# Patient Record
Sex: Male | Born: 1940 | Race: White | Hispanic: No | Marital: Married | Smoking: Former smoker
Health system: Southern US, Community
[De-identification: ages and names within clinical notes are randomized; demographics above are authoritative.]

## PROBLEM LIST (undated history)

## (undated) DIAGNOSIS — I10 Essential (primary) hypertension: Secondary | ICD-10-CM

## (undated) DIAGNOSIS — G4733 Obstructive sleep apnea (adult) (pediatric): Secondary | ICD-10-CM

## (undated) DIAGNOSIS — M5003 Cervical disc disorder with myelopathy, cervicothoracic region: Secondary | ICD-10-CM

## (undated) DIAGNOSIS — C61 Malignant neoplasm of prostate: Secondary | ICD-10-CM

## (undated) DIAGNOSIS — F411 Generalized anxiety disorder: Secondary | ICD-10-CM

## (undated) DIAGNOSIS — Z9989 Dependence on other enabling machines and devices: Secondary | ICD-10-CM

## (undated) DIAGNOSIS — I951 Orthostatic hypotension: Secondary | ICD-10-CM

## (undated) DIAGNOSIS — M25562 Pain in left knee: Secondary | ICD-10-CM

## (undated) DIAGNOSIS — R519 Headache, unspecified: Secondary | ICD-10-CM

## (undated) DIAGNOSIS — G8929 Other chronic pain: Secondary | ICD-10-CM

## (undated) DIAGNOSIS — E782 Mixed hyperlipidemia: Secondary | ICD-10-CM

## (undated) DIAGNOSIS — Z96652 Presence of left artificial knee joint: Secondary | ICD-10-CM

## (undated) DIAGNOSIS — R51 Headache: Secondary | ICD-10-CM

## (undated) DIAGNOSIS — A059 Bacterial foodborne intoxication, unspecified: Secondary | ICD-10-CM

## (undated) DIAGNOSIS — F419 Anxiety disorder, unspecified: Secondary | ICD-10-CM

## (undated) HISTORY — PX: SEPTOPLASTY: SUR1290

## (undated) HISTORY — DX: Mixed hyperlipidemia: E78.2

## (undated) HISTORY — DX: Generalized anxiety disorder: F41.1

## (undated) HISTORY — DX: Presence of left artificial knee joint: Z96.652

## (undated) HISTORY — PX: HERNIA REPAIR: SHX51

## (undated) HISTORY — DX: Cervical disc disorder with myelopathy, cervicothoracic region: M50.03

## (undated) HISTORY — DX: Other chronic pain: G89.29

## (undated) HISTORY — DX: Orthostatic hypotension: I95.1

## (undated) HISTORY — DX: Malignant neoplasm of prostate: C61

## (undated) HISTORY — PX: CATARACT EXTRACTION W/ INTRAOCULAR LENS  IMPLANT, BILATERAL: SHX1307

## (undated) HISTORY — PX: CARDIAC CATHETERIZATION: SHX172

## (undated) HISTORY — DX: Pain in left knee: M25.562

## (undated) HISTORY — PX: JOINT REPLACEMENT: SHX530

---

## 2003-04-02 ENCOUNTER — Encounter: Admission: RE | Admit: 2003-04-02 | Discharge: 2003-04-02 | Payer: Self-pay | Admitting: Otolaryngology

## 2003-04-02 ENCOUNTER — Ambulatory Visit (HOSPITAL_BASED_OUTPATIENT_CLINIC_OR_DEPARTMENT_OTHER): Admission: RE | Admit: 2003-04-02 | Discharge: 2003-04-02 | Payer: Self-pay | Admitting: Otolaryngology

## 2003-04-02 ENCOUNTER — Encounter: Payer: Self-pay | Admitting: Otolaryngology

## 2004-12-14 HISTORY — PX: PROSTATE SURGERY: SHX751

## 2005-02-25 ENCOUNTER — Encounter: Admission: RE | Admit: 2005-02-25 | Discharge: 2005-02-25 | Payer: Self-pay | Admitting: Urology

## 2005-04-02 ENCOUNTER — Inpatient Hospital Stay (HOSPITAL_COMMUNITY): Admission: RE | Admit: 2005-04-02 | Discharge: 2005-04-04 | Payer: Self-pay | Admitting: Urology

## 2006-02-14 ENCOUNTER — Emergency Department (HOSPITAL_COMMUNITY): Admission: EM | Admit: 2006-02-14 | Discharge: 2006-02-14 | Payer: Self-pay | Admitting: Emergency Medicine

## 2011-12-29 DIAGNOSIS — R5383 Other fatigue: Secondary | ICD-10-CM | POA: Diagnosis not present

## 2011-12-29 DIAGNOSIS — L508 Other urticaria: Secondary | ICD-10-CM | POA: Diagnosis not present

## 2011-12-29 DIAGNOSIS — R5381 Other malaise: Secondary | ICD-10-CM | POA: Diagnosis not present

## 2012-01-12 DIAGNOSIS — D485 Neoplasm of uncertain behavior of skin: Secondary | ICD-10-CM | POA: Diagnosis not present

## 2012-01-12 DIAGNOSIS — L280 Lichen simplex chronicus: Secondary | ICD-10-CM | POA: Diagnosis not present

## 2012-01-12 DIAGNOSIS — L508 Other urticaria: Secondary | ICD-10-CM | POA: Diagnosis not present

## 2012-01-14 DIAGNOSIS — L509 Urticaria, unspecified: Secondary | ICD-10-CM | POA: Diagnosis not present

## 2012-01-14 DIAGNOSIS — I951 Orthostatic hypotension: Secondary | ICD-10-CM | POA: Diagnosis not present

## 2012-01-14 DIAGNOSIS — I10 Essential (primary) hypertension: Secondary | ICD-10-CM | POA: Diagnosis not present

## 2012-01-21 DIAGNOSIS — I498 Other specified cardiac arrhythmias: Secondary | ICD-10-CM | POA: Diagnosis not present

## 2012-02-04 DIAGNOSIS — Z125 Encounter for screening for malignant neoplasm of prostate: Secondary | ICD-10-CM | POA: Diagnosis not present

## 2012-02-04 DIAGNOSIS — Z8546 Personal history of malignant neoplasm of prostate: Secondary | ICD-10-CM | POA: Diagnosis not present

## 2012-02-04 DIAGNOSIS — E7800 Pure hypercholesterolemia, unspecified: Secondary | ICD-10-CM | POA: Diagnosis not present

## 2012-02-04 DIAGNOSIS — Z0000 Encounter for general adult medical examination without abnormal findings: Secondary | ICD-10-CM | POA: Diagnosis not present

## 2012-02-11 DIAGNOSIS — L509 Urticaria, unspecified: Secondary | ICD-10-CM | POA: Diagnosis not present

## 2012-02-11 DIAGNOSIS — I10 Essential (primary) hypertension: Secondary | ICD-10-CM | POA: Diagnosis not present

## 2012-03-09 DIAGNOSIS — L508 Other urticaria: Secondary | ICD-10-CM | POA: Diagnosis not present

## 2012-04-19 DIAGNOSIS — L719 Rosacea, unspecified: Secondary | ICD-10-CM | POA: Diagnosis not present

## 2012-04-19 DIAGNOSIS — L508 Other urticaria: Secondary | ICD-10-CM | POA: Diagnosis not present

## 2012-06-10 DIAGNOSIS — R5381 Other malaise: Secondary | ICD-10-CM | POA: Diagnosis not present

## 2012-08-23 DIAGNOSIS — K5730 Diverticulosis of large intestine without perforation or abscess without bleeding: Secondary | ICD-10-CM | POA: Diagnosis not present

## 2012-08-25 DIAGNOSIS — K5730 Diverticulosis of large intestine without perforation or abscess without bleeding: Secondary | ICD-10-CM | POA: Diagnosis not present

## 2012-09-07 DIAGNOSIS — C44711 Basal cell carcinoma of skin of unspecified lower limb, including hip: Secondary | ICD-10-CM | POA: Diagnosis not present

## 2012-09-07 DIAGNOSIS — L508 Other urticaria: Secondary | ICD-10-CM | POA: Diagnosis not present

## 2012-09-19 DIAGNOSIS — C44721 Squamous cell carcinoma of skin of unspecified lower limb, including hip: Secondary | ICD-10-CM | POA: Diagnosis not present

## 2012-09-21 DIAGNOSIS — I10 Essential (primary) hypertension: Secondary | ICD-10-CM | POA: Diagnosis not present

## 2012-09-21 DIAGNOSIS — L509 Urticaria, unspecified: Secondary | ICD-10-CM | POA: Diagnosis not present

## 2012-09-21 DIAGNOSIS — I2510 Atherosclerotic heart disease of native coronary artery without angina pectoris: Secondary | ICD-10-CM | POA: Diagnosis not present

## 2012-09-21 DIAGNOSIS — C4491 Basal cell carcinoma of skin, unspecified: Secondary | ICD-10-CM | POA: Diagnosis not present

## 2012-10-13 DIAGNOSIS — L508 Other urticaria: Secondary | ICD-10-CM | POA: Diagnosis not present

## 2012-10-13 DIAGNOSIS — E7800 Pure hypercholesterolemia, unspecified: Secondary | ICD-10-CM | POA: Diagnosis not present

## 2012-10-13 DIAGNOSIS — F411 Generalized anxiety disorder: Secondary | ICD-10-CM | POA: Diagnosis not present

## 2012-10-13 DIAGNOSIS — I10 Essential (primary) hypertension: Secondary | ICD-10-CM | POA: Diagnosis not present

## 2012-10-13 DIAGNOSIS — Z79899 Other long term (current) drug therapy: Secondary | ICD-10-CM | POA: Diagnosis not present

## 2012-11-01 DIAGNOSIS — F411 Generalized anxiety disorder: Secondary | ICD-10-CM | POA: Diagnosis not present

## 2012-12-14 HISTORY — PX: COLOSTOMY REVERSAL: SHX5782

## 2012-12-14 HISTORY — PX: COLON SURGERY: SHX602

## 2012-12-16 DIAGNOSIS — Z23 Encounter for immunization: Secondary | ICD-10-CM | POA: Diagnosis not present

## 2012-12-26 DIAGNOSIS — N200 Calculus of kidney: Secondary | ICD-10-CM | POA: Diagnosis not present

## 2012-12-26 DIAGNOSIS — K631 Perforation of intestine (nontraumatic): Secondary | ICD-10-CM | POA: Diagnosis not present

## 2012-12-26 DIAGNOSIS — R109 Unspecified abdominal pain: Secondary | ICD-10-CM | POA: Diagnosis not present

## 2012-12-26 DIAGNOSIS — I10 Essential (primary) hypertension: Secondary | ICD-10-CM | POA: Diagnosis not present

## 2012-12-26 DIAGNOSIS — K630 Abscess of intestine: Secondary | ICD-10-CM | POA: Diagnosis not present

## 2012-12-26 DIAGNOSIS — K5732 Diverticulitis of large intestine without perforation or abscess without bleeding: Secondary | ICD-10-CM | POA: Diagnosis not present

## 2013-01-04 DIAGNOSIS — I10 Essential (primary) hypertension: Secondary | ICD-10-CM | POA: Diagnosis not present

## 2013-01-04 DIAGNOSIS — Z432 Encounter for attention to ileostomy: Secondary | ICD-10-CM | POA: Diagnosis not present

## 2013-01-05 DIAGNOSIS — Z432 Encounter for attention to ileostomy: Secondary | ICD-10-CM | POA: Diagnosis not present

## 2013-01-05 DIAGNOSIS — I10 Essential (primary) hypertension: Secondary | ICD-10-CM | POA: Diagnosis not present

## 2013-01-06 DIAGNOSIS — Z432 Encounter for attention to ileostomy: Secondary | ICD-10-CM | POA: Diagnosis not present

## 2013-01-06 DIAGNOSIS — I10 Essential (primary) hypertension: Secondary | ICD-10-CM | POA: Diagnosis not present

## 2013-01-09 DIAGNOSIS — Z432 Encounter for attention to ileostomy: Secondary | ICD-10-CM | POA: Diagnosis not present

## 2013-01-09 DIAGNOSIS — I10 Essential (primary) hypertension: Secondary | ICD-10-CM | POA: Diagnosis not present

## 2013-01-10 DIAGNOSIS — I428 Other cardiomyopathies: Secondary | ICD-10-CM | POA: Diagnosis not present

## 2013-01-10 DIAGNOSIS — Z8546 Personal history of malignant neoplasm of prostate: Secondary | ICD-10-CM | POA: Diagnosis not present

## 2013-01-10 DIAGNOSIS — Z23 Encounter for immunization: Secondary | ICD-10-CM | POA: Diagnosis not present

## 2013-01-10 DIAGNOSIS — I2510 Atherosclerotic heart disease of native coronary artery without angina pectoris: Secondary | ICD-10-CM | POA: Diagnosis not present

## 2013-01-10 DIAGNOSIS — G4730 Sleep apnea, unspecified: Secondary | ICD-10-CM | POA: Diagnosis not present

## 2013-01-10 DIAGNOSIS — Z79899 Other long term (current) drug therapy: Secondary | ICD-10-CM | POA: Diagnosis not present

## 2013-01-10 DIAGNOSIS — Z9049 Acquired absence of other specified parts of digestive tract: Secondary | ICD-10-CM | POA: Diagnosis not present

## 2013-01-10 DIAGNOSIS — Z7982 Long term (current) use of aspirin: Secondary | ICD-10-CM | POA: Diagnosis not present

## 2013-01-10 DIAGNOSIS — E860 Dehydration: Secondary | ICD-10-CM | POA: Diagnosis not present

## 2013-01-10 DIAGNOSIS — N179 Acute kidney failure, unspecified: Secondary | ICD-10-CM | POA: Diagnosis not present

## 2013-01-10 DIAGNOSIS — Z87891 Personal history of nicotine dependence: Secondary | ICD-10-CM | POA: Diagnosis not present

## 2013-01-10 DIAGNOSIS — IMO0002 Reserved for concepts with insufficient information to code with codable children: Secondary | ICD-10-CM | POA: Diagnosis not present

## 2013-01-10 DIAGNOSIS — I10 Essential (primary) hypertension: Secondary | ICD-10-CM | POA: Diagnosis not present

## 2013-01-14 DIAGNOSIS — R1084 Generalized abdominal pain: Secondary | ICD-10-CM | POA: Diagnosis not present

## 2013-01-14 DIAGNOSIS — I959 Hypotension, unspecified: Secondary | ICD-10-CM | POA: Diagnosis not present

## 2013-01-14 DIAGNOSIS — I10 Essential (primary) hypertension: Secondary | ICD-10-CM | POA: Diagnosis not present

## 2013-01-14 DIAGNOSIS — N289 Disorder of kidney and ureter, unspecified: Secondary | ICD-10-CM | POA: Diagnosis not present

## 2013-01-14 DIAGNOSIS — E860 Dehydration: Secondary | ICD-10-CM | POA: Diagnosis not present

## 2013-01-14 DIAGNOSIS — R112 Nausea with vomiting, unspecified: Secondary | ICD-10-CM | POA: Diagnosis not present

## 2013-01-14 DIAGNOSIS — Z432 Encounter for attention to ileostomy: Secondary | ICD-10-CM | POA: Diagnosis not present

## 2013-01-15 DIAGNOSIS — I369 Nonrheumatic tricuspid valve disorder, unspecified: Secondary | ICD-10-CM | POA: Diagnosis not present

## 2013-01-15 DIAGNOSIS — R112 Nausea with vomiting, unspecified: Secondary | ICD-10-CM | POA: Diagnosis not present

## 2013-01-15 DIAGNOSIS — R1084 Generalized abdominal pain: Secondary | ICD-10-CM | POA: Diagnosis not present

## 2013-01-15 DIAGNOSIS — I359 Nonrheumatic aortic valve disorder, unspecified: Secondary | ICD-10-CM | POA: Diagnosis not present

## 2013-01-15 DIAGNOSIS — N179 Acute kidney failure, unspecified: Secondary | ICD-10-CM | POA: Diagnosis not present

## 2013-01-15 DIAGNOSIS — E860 Dehydration: Secondary | ICD-10-CM | POA: Diagnosis not present

## 2013-01-15 DIAGNOSIS — A045 Campylobacter enteritis: Secondary | ICD-10-CM | POA: Diagnosis not present

## 2013-01-15 DIAGNOSIS — Z0389 Encounter for observation for other suspected diseases and conditions ruled out: Secondary | ICD-10-CM | POA: Diagnosis not present

## 2013-01-15 DIAGNOSIS — N289 Disorder of kidney and ureter, unspecified: Secondary | ICD-10-CM | POA: Diagnosis not present

## 2013-01-15 DIAGNOSIS — IMO0002 Reserved for concepts with insufficient information to code with codable children: Secondary | ICD-10-CM | POA: Diagnosis not present

## 2013-01-15 DIAGNOSIS — I959 Hypotension, unspecified: Secondary | ICD-10-CM | POA: Diagnosis not present

## 2013-01-15 DIAGNOSIS — I10 Essential (primary) hypertension: Secondary | ICD-10-CM | POA: Diagnosis not present

## 2013-01-15 DIAGNOSIS — E46 Unspecified protein-calorie malnutrition: Secondary | ICD-10-CM | POA: Diagnosis not present

## 2013-01-15 DIAGNOSIS — R197 Diarrhea, unspecified: Secondary | ICD-10-CM | POA: Diagnosis not present

## 2013-01-15 DIAGNOSIS — Z932 Ileostomy status: Secondary | ICD-10-CM | POA: Diagnosis not present

## 2013-01-25 DIAGNOSIS — N289 Disorder of kidney and ureter, unspecified: Secondary | ICD-10-CM | POA: Diagnosis not present

## 2013-01-25 DIAGNOSIS — I951 Orthostatic hypotension: Secondary | ICD-10-CM | POA: Diagnosis not present

## 2013-01-25 DIAGNOSIS — R5381 Other malaise: Secondary | ICD-10-CM | POA: Diagnosis not present

## 2013-01-25 DIAGNOSIS — K5732 Diverticulitis of large intestine without perforation or abscess without bleeding: Secondary | ICD-10-CM | POA: Diagnosis not present

## 2013-01-25 DIAGNOSIS — R197 Diarrhea, unspecified: Secondary | ICD-10-CM | POA: Diagnosis not present

## 2013-01-25 DIAGNOSIS — R638 Other symptoms and signs concerning food and fluid intake: Secondary | ICD-10-CM | POA: Diagnosis not present

## 2013-01-28 DIAGNOSIS — Z432 Encounter for attention to ileostomy: Secondary | ICD-10-CM | POA: Diagnosis not present

## 2013-01-28 DIAGNOSIS — I10 Essential (primary) hypertension: Secondary | ICD-10-CM | POA: Diagnosis not present

## 2013-01-30 DIAGNOSIS — Z432 Encounter for attention to ileostomy: Secondary | ICD-10-CM | POA: Diagnosis not present

## 2013-01-30 DIAGNOSIS — I10 Essential (primary) hypertension: Secondary | ICD-10-CM | POA: Diagnosis not present

## 2013-02-02 DIAGNOSIS — Z87448 Personal history of other diseases of urinary system: Secondary | ICD-10-CM | POA: Diagnosis not present

## 2013-02-02 DIAGNOSIS — R6889 Other general symptoms and signs: Secondary | ICD-10-CM | POA: Diagnosis not present

## 2013-02-06 DIAGNOSIS — I10 Essential (primary) hypertension: Secondary | ICD-10-CM | POA: Diagnosis not present

## 2013-02-06 DIAGNOSIS — Z0000 Encounter for general adult medical examination without abnormal findings: Secondary | ICD-10-CM | POA: Diagnosis not present

## 2013-02-06 DIAGNOSIS — R3911 Hesitancy of micturition: Secondary | ICD-10-CM | POA: Diagnosis not present

## 2013-02-06 DIAGNOSIS — Z432 Encounter for attention to ileostomy: Secondary | ICD-10-CM | POA: Diagnosis not present

## 2013-02-08 DIAGNOSIS — Z432 Encounter for attention to ileostomy: Secondary | ICD-10-CM | POA: Diagnosis not present

## 2013-02-27 DIAGNOSIS — I10 Essential (primary) hypertension: Secondary | ICD-10-CM | POA: Diagnosis not present

## 2013-03-03 DIAGNOSIS — I10 Essential (primary) hypertension: Secondary | ICD-10-CM | POA: Diagnosis not present

## 2013-03-03 DIAGNOSIS — Z432 Encounter for attention to ileostomy: Secondary | ICD-10-CM | POA: Diagnosis not present

## 2013-03-06 DIAGNOSIS — N289 Disorder of kidney and ureter, unspecified: Secondary | ICD-10-CM | POA: Diagnosis not present

## 2013-03-21 DIAGNOSIS — Z433 Encounter for attention to colostomy: Secondary | ICD-10-CM | POA: Diagnosis not present

## 2013-03-22 DIAGNOSIS — Z433 Encounter for attention to colostomy: Secondary | ICD-10-CM | POA: Diagnosis not present

## 2013-03-24 DIAGNOSIS — L508 Other urticaria: Secondary | ICD-10-CM | POA: Diagnosis not present

## 2013-03-30 DIAGNOSIS — Z01818 Encounter for other preprocedural examination: Secondary | ICD-10-CM | POA: Diagnosis not present

## 2013-03-30 DIAGNOSIS — Z01812 Encounter for preprocedural laboratory examination: Secondary | ICD-10-CM | POA: Diagnosis not present

## 2013-04-05 DIAGNOSIS — Z79899 Other long term (current) drug therapy: Secondary | ICD-10-CM | POA: Diagnosis not present

## 2013-04-05 DIAGNOSIS — Z7982 Long term (current) use of aspirin: Secondary | ICD-10-CM | POA: Diagnosis not present

## 2013-04-05 DIAGNOSIS — Z432 Encounter for attention to ileostomy: Secondary | ICD-10-CM | POA: Diagnosis not present

## 2013-04-05 DIAGNOSIS — K660 Peritoneal adhesions (postprocedural) (postinfection): Secondary | ICD-10-CM | POA: Diagnosis not present

## 2013-04-05 DIAGNOSIS — Z9981 Dependence on supplemental oxygen: Secondary | ICD-10-CM | POA: Diagnosis not present

## 2013-04-05 DIAGNOSIS — G4730 Sleep apnea, unspecified: Secondary | ICD-10-CM | POA: Diagnosis present

## 2013-04-05 DIAGNOSIS — I2510 Atherosclerotic heart disease of native coronary artery without angina pectoris: Secondary | ICD-10-CM | POA: Diagnosis present

## 2013-04-05 DIAGNOSIS — K4090 Unilateral inguinal hernia, without obstruction or gangrene, not specified as recurrent: Secondary | ICD-10-CM | POA: Diagnosis not present

## 2013-04-10 DIAGNOSIS — I10 Essential (primary) hypertension: Secondary | ICD-10-CM | POA: Diagnosis not present

## 2013-04-12 DIAGNOSIS — I10 Essential (primary) hypertension: Secondary | ICD-10-CM | POA: Diagnosis not present

## 2013-04-14 DIAGNOSIS — I10 Essential (primary) hypertension: Secondary | ICD-10-CM | POA: Diagnosis not present

## 2013-04-17 DIAGNOSIS — I10 Essential (primary) hypertension: Secondary | ICD-10-CM | POA: Diagnosis not present

## 2013-04-18 DIAGNOSIS — T8189XA Other complications of procedures, not elsewhere classified, initial encounter: Secondary | ICD-10-CM | POA: Diagnosis not present

## 2013-04-19 DIAGNOSIS — I10 Essential (primary) hypertension: Secondary | ICD-10-CM | POA: Diagnosis not present

## 2013-04-21 DIAGNOSIS — I10 Essential (primary) hypertension: Secondary | ICD-10-CM | POA: Diagnosis not present

## 2013-04-24 DIAGNOSIS — I10 Essential (primary) hypertension: Secondary | ICD-10-CM | POA: Diagnosis not present

## 2013-04-26 DIAGNOSIS — I10 Essential (primary) hypertension: Secondary | ICD-10-CM | POA: Diagnosis not present

## 2013-04-28 DIAGNOSIS — I10 Essential (primary) hypertension: Secondary | ICD-10-CM | POA: Diagnosis not present

## 2013-05-01 DIAGNOSIS — I10 Essential (primary) hypertension: Secondary | ICD-10-CM | POA: Diagnosis not present

## 2013-05-03 DIAGNOSIS — I10 Essential (primary) hypertension: Secondary | ICD-10-CM | POA: Diagnosis not present

## 2013-05-04 DIAGNOSIS — I10 Essential (primary) hypertension: Secondary | ICD-10-CM | POA: Diagnosis not present

## 2013-05-05 DIAGNOSIS — I10 Essential (primary) hypertension: Secondary | ICD-10-CM | POA: Diagnosis not present

## 2013-05-08 DIAGNOSIS — I10 Essential (primary) hypertension: Secondary | ICD-10-CM | POA: Diagnosis not present

## 2013-05-09 DIAGNOSIS — I2510 Atherosclerotic heart disease of native coronary artery without angina pectoris: Secondary | ICD-10-CM | POA: Diagnosis not present

## 2013-05-09 DIAGNOSIS — I428 Other cardiomyopathies: Secondary | ICD-10-CM | POA: Diagnosis not present

## 2013-05-09 DIAGNOSIS — I10 Essential (primary) hypertension: Secondary | ICD-10-CM | POA: Diagnosis not present

## 2013-05-09 DIAGNOSIS — K4090 Unilateral inguinal hernia, without obstruction or gangrene, not specified as recurrent: Secondary | ICD-10-CM | POA: Diagnosis not present

## 2013-05-09 DIAGNOSIS — E785 Hyperlipidemia, unspecified: Secondary | ICD-10-CM | POA: Diagnosis not present

## 2013-05-09 DIAGNOSIS — E7800 Pure hypercholesterolemia, unspecified: Secondary | ICD-10-CM | POA: Diagnosis not present

## 2013-05-10 DIAGNOSIS — I10 Essential (primary) hypertension: Secondary | ICD-10-CM | POA: Diagnosis not present

## 2013-05-12 DIAGNOSIS — I10 Essential (primary) hypertension: Secondary | ICD-10-CM | POA: Diagnosis not present

## 2013-05-17 DIAGNOSIS — I10 Essential (primary) hypertension: Secondary | ICD-10-CM | POA: Diagnosis not present

## 2013-07-17 DIAGNOSIS — K4090 Unilateral inguinal hernia, without obstruction or gangrene, not specified as recurrent: Secondary | ICD-10-CM | POA: Diagnosis not present

## 2013-07-18 DIAGNOSIS — G4730 Sleep apnea, unspecified: Secondary | ICD-10-CM | POA: Diagnosis not present

## 2013-07-18 DIAGNOSIS — I2510 Atherosclerotic heart disease of native coronary artery without angina pectoris: Secondary | ICD-10-CM | POA: Diagnosis not present

## 2013-07-18 DIAGNOSIS — Z7982 Long term (current) use of aspirin: Secondary | ICD-10-CM | POA: Diagnosis not present

## 2013-07-18 DIAGNOSIS — Z79899 Other long term (current) drug therapy: Secondary | ICD-10-CM | POA: Diagnosis not present

## 2013-07-18 DIAGNOSIS — E785 Hyperlipidemia, unspecified: Secondary | ICD-10-CM | POA: Diagnosis not present

## 2013-07-18 DIAGNOSIS — N400 Enlarged prostate without lower urinary tract symptoms: Secondary | ICD-10-CM | POA: Diagnosis not present

## 2013-07-18 DIAGNOSIS — K4090 Unilateral inguinal hernia, without obstruction or gangrene, not specified as recurrent: Secondary | ICD-10-CM | POA: Diagnosis not present

## 2013-07-18 DIAGNOSIS — I428 Other cardiomyopathies: Secondary | ICD-10-CM | POA: Diagnosis not present

## 2013-07-18 DIAGNOSIS — I10 Essential (primary) hypertension: Secondary | ICD-10-CM | POA: Diagnosis not present

## 2013-07-18 DIAGNOSIS — Z9981 Dependence on supplemental oxygen: Secondary | ICD-10-CM | POA: Diagnosis not present

## 2013-07-18 DIAGNOSIS — Z87891 Personal history of nicotine dependence: Secondary | ICD-10-CM | POA: Diagnosis not present

## 2013-07-18 DIAGNOSIS — C4491 Basal cell carcinoma of skin, unspecified: Secondary | ICD-10-CM | POA: Diagnosis not present

## 2013-07-19 DIAGNOSIS — I10 Essential (primary) hypertension: Secondary | ICD-10-CM | POA: Diagnosis not present

## 2013-07-19 DIAGNOSIS — G4733 Obstructive sleep apnea (adult) (pediatric): Secondary | ICD-10-CM | POA: Diagnosis not present

## 2013-07-19 DIAGNOSIS — Z9981 Dependence on supplemental oxygen: Secondary | ICD-10-CM | POA: Diagnosis not present

## 2013-07-19 DIAGNOSIS — G4730 Sleep apnea, unspecified: Secondary | ICD-10-CM | POA: Diagnosis not present

## 2013-07-19 DIAGNOSIS — I428 Other cardiomyopathies: Secondary | ICD-10-CM | POA: Diagnosis not present

## 2013-07-19 DIAGNOSIS — K4090 Unilateral inguinal hernia, without obstruction or gangrene, not specified as recurrent: Secondary | ICD-10-CM | POA: Diagnosis not present

## 2013-07-19 DIAGNOSIS — N400 Enlarged prostate without lower urinary tract symptoms: Secondary | ICD-10-CM | POA: Diagnosis not present

## 2013-07-20 DIAGNOSIS — N400 Enlarged prostate without lower urinary tract symptoms: Secondary | ICD-10-CM | POA: Diagnosis not present

## 2013-07-20 DIAGNOSIS — I428 Other cardiomyopathies: Secondary | ICD-10-CM | POA: Diagnosis not present

## 2013-07-20 DIAGNOSIS — I10 Essential (primary) hypertension: Secondary | ICD-10-CM | POA: Diagnosis not present

## 2013-07-20 DIAGNOSIS — K4090 Unilateral inguinal hernia, without obstruction or gangrene, not specified as recurrent: Secondary | ICD-10-CM | POA: Diagnosis not present

## 2013-07-20 DIAGNOSIS — Z9981 Dependence on supplemental oxygen: Secondary | ICD-10-CM | POA: Diagnosis not present

## 2013-07-20 DIAGNOSIS — G4730 Sleep apnea, unspecified: Secondary | ICD-10-CM | POA: Diagnosis not present

## 2013-08-17 DIAGNOSIS — R5381 Other malaise: Secondary | ICD-10-CM | POA: Diagnosis not present

## 2013-08-17 DIAGNOSIS — E7800 Pure hypercholesterolemia, unspecified: Secondary | ICD-10-CM | POA: Diagnosis not present

## 2013-08-17 DIAGNOSIS — I10 Essential (primary) hypertension: Secondary | ICD-10-CM | POA: Diagnosis not present

## 2013-08-21 DIAGNOSIS — E7800 Pure hypercholesterolemia, unspecified: Secondary | ICD-10-CM | POA: Diagnosis not present

## 2013-08-21 DIAGNOSIS — Z23 Encounter for immunization: Secondary | ICD-10-CM | POA: Diagnosis not present

## 2013-08-21 DIAGNOSIS — I10 Essential (primary) hypertension: Secondary | ICD-10-CM | POA: Diagnosis not present

## 2013-09-19 DIAGNOSIS — G4730 Sleep apnea, unspecified: Secondary | ICD-10-CM | POA: Diagnosis not present

## 2013-10-30 DIAGNOSIS — K432 Incisional hernia without obstruction or gangrene: Secondary | ICD-10-CM | POA: Diagnosis not present

## 2013-11-02 DIAGNOSIS — R21 Rash and other nonspecific skin eruption: Secondary | ICD-10-CM | POA: Diagnosis not present

## 2013-11-15 DIAGNOSIS — K430 Incisional hernia with obstruction, without gangrene: Secondary | ICD-10-CM | POA: Diagnosis not present

## 2013-11-15 DIAGNOSIS — K432 Incisional hernia without obstruction or gangrene: Secondary | ICD-10-CM | POA: Diagnosis not present

## 2013-11-15 DIAGNOSIS — Z7982 Long term (current) use of aspirin: Secondary | ICD-10-CM | POA: Diagnosis not present

## 2013-11-15 DIAGNOSIS — G4730 Sleep apnea, unspecified: Secondary | ICD-10-CM | POA: Diagnosis not present

## 2013-11-15 DIAGNOSIS — G4733 Obstructive sleep apnea (adult) (pediatric): Secondary | ICD-10-CM | POA: Diagnosis not present

## 2013-11-15 DIAGNOSIS — I10 Essential (primary) hypertension: Secondary | ICD-10-CM | POA: Diagnosis not present

## 2013-11-15 DIAGNOSIS — Z79899 Other long term (current) drug therapy: Secondary | ICD-10-CM | POA: Diagnosis not present

## 2013-11-16 DIAGNOSIS — Z79899 Other long term (current) drug therapy: Secondary | ICD-10-CM | POA: Diagnosis not present

## 2013-11-16 DIAGNOSIS — I10 Essential (primary) hypertension: Secondary | ICD-10-CM | POA: Diagnosis not present

## 2013-11-16 DIAGNOSIS — Z7982 Long term (current) use of aspirin: Secondary | ICD-10-CM | POA: Diagnosis not present

## 2013-11-16 DIAGNOSIS — G4733 Obstructive sleep apnea (adult) (pediatric): Secondary | ICD-10-CM | POA: Diagnosis not present

## 2013-11-16 DIAGNOSIS — K430 Incisional hernia with obstruction, without gangrene: Secondary | ICD-10-CM | POA: Diagnosis not present

## 2014-01-09 DIAGNOSIS — I10 Essential (primary) hypertension: Secondary | ICD-10-CM | POA: Diagnosis not present

## 2014-01-09 DIAGNOSIS — I517 Cardiomegaly: Secondary | ICD-10-CM | POA: Diagnosis not present

## 2014-01-09 DIAGNOSIS — F411 Generalized anxiety disorder: Secondary | ICD-10-CM | POA: Diagnosis not present

## 2014-01-09 DIAGNOSIS — E785 Hyperlipidemia, unspecified: Secondary | ICD-10-CM | POA: Diagnosis not present

## 2014-01-09 DIAGNOSIS — I2510 Atherosclerotic heart disease of native coronary artery without angina pectoris: Secondary | ICD-10-CM | POA: Diagnosis not present

## 2014-02-06 DIAGNOSIS — I10 Essential (primary) hypertension: Secondary | ICD-10-CM | POA: Diagnosis not present

## 2014-02-06 DIAGNOSIS — E7800 Pure hypercholesterolemia, unspecified: Secondary | ICD-10-CM | POA: Diagnosis not present

## 2014-02-06 DIAGNOSIS — Z8546 Personal history of malignant neoplasm of prostate: Secondary | ICD-10-CM | POA: Diagnosis not present

## 2014-02-06 DIAGNOSIS — Z0000 Encounter for general adult medical examination without abnormal findings: Secondary | ICD-10-CM | POA: Diagnosis not present

## 2014-02-06 DIAGNOSIS — F411 Generalized anxiety disorder: Secondary | ICD-10-CM | POA: Diagnosis not present

## 2014-03-08 DIAGNOSIS — R944 Abnormal results of kidney function studies: Secondary | ICD-10-CM | POA: Diagnosis not present

## 2014-04-05 DIAGNOSIS — R197 Diarrhea, unspecified: Secondary | ICD-10-CM | POA: Diagnosis not present

## 2014-04-09 DIAGNOSIS — R197 Diarrhea, unspecified: Secondary | ICD-10-CM | POA: Diagnosis not present

## 2014-07-24 DIAGNOSIS — I2510 Atherosclerotic heart disease of native coronary artery without angina pectoris: Secondary | ICD-10-CM | POA: Diagnosis not present

## 2014-07-24 DIAGNOSIS — E785 Hyperlipidemia, unspecified: Secondary | ICD-10-CM | POA: Diagnosis not present

## 2014-07-24 DIAGNOSIS — I10 Essential (primary) hypertension: Secondary | ICD-10-CM | POA: Diagnosis not present

## 2014-07-24 DIAGNOSIS — I119 Hypertensive heart disease without heart failure: Secondary | ICD-10-CM | POA: Diagnosis not present

## 2014-09-27 DIAGNOSIS — F411 Generalized anxiety disorder: Secondary | ICD-10-CM | POA: Diagnosis not present

## 2014-09-27 DIAGNOSIS — Z23 Encounter for immunization: Secondary | ICD-10-CM | POA: Diagnosis not present

## 2014-09-27 DIAGNOSIS — I10 Essential (primary) hypertension: Secondary | ICD-10-CM | POA: Diagnosis not present

## 2015-01-08 DIAGNOSIS — E782 Mixed hyperlipidemia: Secondary | ICD-10-CM | POA: Diagnosis not present

## 2015-01-08 DIAGNOSIS — I10 Essential (primary) hypertension: Secondary | ICD-10-CM | POA: Diagnosis not present

## 2015-01-08 DIAGNOSIS — Z8546 Personal history of malignant neoplasm of prostate: Secondary | ICD-10-CM | POA: Diagnosis not present

## 2015-01-08 DIAGNOSIS — Z0000 Encounter for general adult medical examination without abnormal findings: Secondary | ICD-10-CM | POA: Diagnosis not present

## 2015-06-12 DIAGNOSIS — L299 Pruritus, unspecified: Secondary | ICD-10-CM | POA: Diagnosis not present

## 2015-06-12 DIAGNOSIS — L501 Idiopathic urticaria: Secondary | ICD-10-CM | POA: Diagnosis not present

## 2015-09-18 DIAGNOSIS — L820 Inflamed seborrheic keratosis: Secondary | ICD-10-CM | POA: Diagnosis not present

## 2015-09-18 DIAGNOSIS — C44311 Basal cell carcinoma of skin of nose: Secondary | ICD-10-CM | POA: Diagnosis not present

## 2015-10-28 DIAGNOSIS — Z23 Encounter for immunization: Secondary | ICD-10-CM | POA: Diagnosis not present

## 2015-11-11 DIAGNOSIS — G4733 Obstructive sleep apnea (adult) (pediatric): Secondary | ICD-10-CM | POA: Diagnosis not present

## 2015-11-11 DIAGNOSIS — F411 Generalized anxiety disorder: Secondary | ICD-10-CM | POA: Diagnosis not present

## 2015-11-11 DIAGNOSIS — Z23 Encounter for immunization: Secondary | ICD-10-CM | POA: Diagnosis not present

## 2015-11-11 DIAGNOSIS — C4491 Basal cell carcinoma of skin, unspecified: Secondary | ICD-10-CM | POA: Diagnosis not present

## 2015-11-11 DIAGNOSIS — I10 Essential (primary) hypertension: Secondary | ICD-10-CM | POA: Diagnosis not present

## 2015-11-11 DIAGNOSIS — E784 Other hyperlipidemia: Secondary | ICD-10-CM | POA: Diagnosis not present

## 2015-11-11 DIAGNOSIS — C61 Malignant neoplasm of prostate: Secondary | ICD-10-CM | POA: Diagnosis not present

## 2015-11-11 DIAGNOSIS — Z8546 Personal history of malignant neoplasm of prostate: Secondary | ICD-10-CM | POA: Diagnosis not present

## 2015-11-18 DIAGNOSIS — L501 Idiopathic urticaria: Secondary | ICD-10-CM | POA: Diagnosis not present

## 2015-11-18 DIAGNOSIS — L570 Actinic keratosis: Secondary | ICD-10-CM | POA: Diagnosis not present

## 2015-11-18 DIAGNOSIS — C44722 Squamous cell carcinoma of skin of right lower limb, including hip: Secondary | ICD-10-CM | POA: Diagnosis not present

## 2016-01-14 DIAGNOSIS — R21 Rash and other nonspecific skin eruption: Secondary | ICD-10-CM | POA: Diagnosis not present

## 2016-01-20 DIAGNOSIS — C44311 Basal cell carcinoma of skin of nose: Secondary | ICD-10-CM | POA: Diagnosis not present

## 2016-01-20 DIAGNOSIS — L820 Inflamed seborrheic keratosis: Secondary | ICD-10-CM | POA: Diagnosis not present

## 2016-01-20 DIAGNOSIS — L570 Actinic keratosis: Secondary | ICD-10-CM | POA: Diagnosis not present

## 2016-01-20 DIAGNOSIS — L501 Idiopathic urticaria: Secondary | ICD-10-CM | POA: Diagnosis not present

## 2016-03-16 DIAGNOSIS — L300 Nummular dermatitis: Secondary | ICD-10-CM | POA: Diagnosis not present

## 2016-03-16 DIAGNOSIS — L570 Actinic keratosis: Secondary | ICD-10-CM | POA: Diagnosis not present

## 2016-03-16 DIAGNOSIS — L578 Other skin changes due to chronic exposure to nonionizing radiation: Secondary | ICD-10-CM | POA: Diagnosis not present

## 2016-04-06 DIAGNOSIS — L300 Nummular dermatitis: Secondary | ICD-10-CM | POA: Diagnosis not present

## 2016-04-09 DIAGNOSIS — F411 Generalized anxiety disorder: Secondary | ICD-10-CM | POA: Diagnosis not present

## 2016-04-09 DIAGNOSIS — F320 Major depressive disorder, single episode, mild: Secondary | ICD-10-CM | POA: Diagnosis not present

## 2016-04-09 DIAGNOSIS — I10 Essential (primary) hypertension: Secondary | ICD-10-CM | POA: Diagnosis not present

## 2016-05-20 DIAGNOSIS — L508 Other urticaria: Secondary | ICD-10-CM | POA: Diagnosis not present

## 2016-05-20 DIAGNOSIS — F411 Generalized anxiety disorder: Secondary | ICD-10-CM | POA: Diagnosis not present

## 2016-05-20 DIAGNOSIS — I10 Essential (primary) hypertension: Secondary | ICD-10-CM | POA: Diagnosis not present

## 2016-05-20 DIAGNOSIS — F321 Major depressive disorder, single episode, moderate: Secondary | ICD-10-CM | POA: Diagnosis not present

## 2016-08-10 ENCOUNTER — Other Ambulatory Visit: Payer: Self-pay

## 2016-08-11 DIAGNOSIS — M1712 Unilateral primary osteoarthritis, left knee: Secondary | ICD-10-CM | POA: Diagnosis not present

## 2016-08-11 DIAGNOSIS — M25462 Effusion, left knee: Secondary | ICD-10-CM | POA: Diagnosis not present

## 2016-08-11 DIAGNOSIS — M25562 Pain in left knee: Secondary | ICD-10-CM | POA: Diagnosis not present

## 2016-08-11 DIAGNOSIS — G8929 Other chronic pain: Secondary | ICD-10-CM

## 2016-08-11 HISTORY — DX: Other chronic pain: G89.29

## 2016-08-13 DIAGNOSIS — M1712 Unilateral primary osteoarthritis, left knee: Secondary | ICD-10-CM | POA: Diagnosis not present

## 2016-08-13 DIAGNOSIS — Z01818 Encounter for other preprocedural examination: Secondary | ICD-10-CM | POA: Diagnosis not present

## 2016-08-13 DIAGNOSIS — F411 Generalized anxiety disorder: Secondary | ICD-10-CM | POA: Diagnosis not present

## 2016-08-13 DIAGNOSIS — G4733 Obstructive sleep apnea (adult) (pediatric): Secondary | ICD-10-CM | POA: Diagnosis not present

## 2016-08-13 DIAGNOSIS — Z01810 Encounter for preprocedural cardiovascular examination: Secondary | ICD-10-CM | POA: Diagnosis not present

## 2016-08-13 DIAGNOSIS — R918 Other nonspecific abnormal finding of lung field: Secondary | ICD-10-CM | POA: Diagnosis not present

## 2016-08-13 DIAGNOSIS — Z23 Encounter for immunization: Secondary | ICD-10-CM | POA: Diagnosis not present

## 2016-09-03 ENCOUNTER — Other Ambulatory Visit: Payer: Self-pay | Admitting: Orthopedic Surgery

## 2016-09-09 DIAGNOSIS — F320 Major depressive disorder, single episode, mild: Secondary | ICD-10-CM | POA: Diagnosis not present

## 2016-09-09 DIAGNOSIS — F411 Generalized anxiety disorder: Secondary | ICD-10-CM | POA: Diagnosis not present

## 2016-09-09 DIAGNOSIS — R42 Dizziness and giddiness: Secondary | ICD-10-CM | POA: Diagnosis not present

## 2016-09-09 DIAGNOSIS — I10 Essential (primary) hypertension: Secondary | ICD-10-CM | POA: Diagnosis not present

## 2016-09-16 DIAGNOSIS — R42 Dizziness and giddiness: Secondary | ICD-10-CM | POA: Diagnosis not present

## 2016-09-17 ENCOUNTER — Other Ambulatory Visit (HOSPITAL_COMMUNITY): Payer: Self-pay

## 2016-09-18 ENCOUNTER — Encounter (HOSPITAL_COMMUNITY)
Admission: RE | Admit: 2016-09-18 | Discharge: 2016-09-18 | Disposition: A | Discharge status: Home / Self Care | Payer: Medicare Other | Source: Ambulatory Visit | Attending: Orthopedic Surgery | Admitting: Orthopedic Surgery

## 2016-09-18 ENCOUNTER — Encounter (HOSPITAL_COMMUNITY): Payer: Self-pay

## 2016-09-18 DIAGNOSIS — I10 Essential (primary) hypertension: Secondary | ICD-10-CM | POA: Insufficient documentation

## 2016-09-18 DIAGNOSIS — M25562 Pain in left knee: Secondary | ICD-10-CM | POA: Diagnosis not present

## 2016-09-18 DIAGNOSIS — Z87891 Personal history of nicotine dependence: Secondary | ICD-10-CM | POA: Diagnosis not present

## 2016-09-18 DIAGNOSIS — M1712 Unilateral primary osteoarthritis, left knee: Secondary | ICD-10-CM | POA: Insufficient documentation

## 2016-09-18 DIAGNOSIS — G8929 Other chronic pain: Secondary | ICD-10-CM | POA: Diagnosis not present

## 2016-09-18 DIAGNOSIS — G4730 Sleep apnea, unspecified: Secondary | ICD-10-CM | POA: Diagnosis not present

## 2016-09-18 DIAGNOSIS — Z01812 Encounter for preprocedural laboratory examination: Secondary | ICD-10-CM | POA: Insufficient documentation

## 2016-09-18 DIAGNOSIS — Z888 Allergy status to other drugs, medicaments and biological substances status: Secondary | ICD-10-CM | POA: Diagnosis not present

## 2016-09-18 HISTORY — DX: Essential (primary) hypertension: I10

## 2016-09-18 HISTORY — DX: Headache, unspecified: R51.9

## 2016-09-18 HISTORY — DX: Headache: R51

## 2016-09-18 HISTORY — DX: Anxiety disorder, unspecified: F41.9

## 2016-09-18 HISTORY — DX: Bacterial foodborne intoxication, unspecified: A05.9

## 2016-09-18 LAB — CBC WITH DIFFERENTIAL/PLATELET
BASOS ABS: 0 10*3/uL (ref 0.0–0.1)
BASOS PCT: 0 %
EOS ABS: 0.4 10*3/uL (ref 0.0–0.7)
EOS PCT: 4 %
HCT: 46.3 % (ref 39.0–52.0)
HEMOGLOBIN: 15.2 g/dL (ref 13.0–17.0)
LYMPHS PCT: 24 %
Lymphs Abs: 2 10*3/uL (ref 0.7–4.0)
MCH: 30.2 pg (ref 26.0–34.0)
MCHC: 32.8 g/dL (ref 30.0–36.0)
MCV: 92 fL (ref 78.0–100.0)
Monocytes Absolute: 0.8 10*3/uL (ref 0.1–1.0)
Monocytes Relative: 9 %
NEUTROS ABS: 5.4 10*3/uL (ref 1.7–7.7)
Neutrophils Relative %: 63 %
PLATELETS: 265 10*3/uL (ref 150–400)
RBC: 5.03 MIL/uL (ref 4.22–5.81)
RDW: 13.2 % (ref 11.5–15.5)
WBC: 8.6 10*3/uL (ref 4.0–10.5)

## 2016-09-18 LAB — COMPREHENSIVE METABOLIC PANEL
ALBUMIN: 4.3 g/dL (ref 3.5–5.0)
ALK PHOS: 76 U/L (ref 38–126)
ALT: 22 U/L (ref 17–63)
AST: 23 U/L (ref 15–41)
Anion gap: 9 (ref 5–15)
BUN: 16 mg/dL (ref 6–20)
CALCIUM: 9.7 mg/dL (ref 8.9–10.3)
CHLORIDE: 104 mmol/L (ref 101–111)
CO2: 26 mmol/L (ref 22–32)
CREATININE: 1.14 mg/dL (ref 0.61–1.24)
GFR calc Af Amer: >60 mL/min (ref >60)
GFR calc non Af Amer: >60 mL/min (ref >60)
GLUCOSE: 89 mg/dL (ref 65–99)
Potassium: 4 mmol/L (ref 3.5–5.1)
SODIUM: 139 mmol/L (ref 135–145)
Total Bilirubin: 0.5 mg/dL (ref 0.3–1.2)
Total Protein: 6.8 g/dL (ref 6.5–8.1)

## 2016-09-18 LAB — SURGICAL PCR SCREEN
MRSA, PCR: NEGATIVE
Staphylococcus aureus: POSITIVE — AB

## 2016-09-18 NOTE — Progress Notes (Signed)
PCP:Dr. Shayne Alken @ [facility] in [place]-Request ekg/cxr/notes  Pt. Reported he fell 3 weeks ago and Dr. Tobie Poet stated he probably cracked some ribs. Pt. Denies sob.   Sleep study done 10 yrs. ago

## 2016-09-18 NOTE — Pre-Procedure Instructions (Signed)
    Marcus King  09/18/2016      [facility] [address], [place] [address] [place] [address] Phone: [phone] Fax: [phone]    Your procedure is scheduled on Mon. Oct. 16  Report to [facility] Admitting at 8:45 A.M.  Call this number if you have problems the morning of surgery:  [phone]   Remember:  Do not eat food or drink liquids after midnight on Oct. 15  Take these medicines the morning of surgery with A SIP OF WATER : amlodipine (norvasc), carvedilol (coreg) clonazepam (klonopin), clonidine (catapres), paroxetine (paxil)               1 week prior to surgery stop aspirin, advil, pseudoephedrine (sudafed), motrin, ibuprofen, BCPowders, Goody's, aleve,vitamins and herbal medicines.   Do not wear jewelry.  Do not wear lotions, powders, or cologne, or deoderant.  Do not shave 48 hours prior to surgery.  Men may shave face and neck.  Do not bring valuables to the hospital.  [facility] is not responsible for any belongings or valuables.  Contacts, dentures or bridgework may not be worn into surgery.  Leave your suitcase in the car.  After surgery it may be brought to your room.  For patients admitted to the hospital, discharge time will be determined by your treatment team.  Patients discharged the day of surgery will not be allowed to drive home.    Special instructions:  Review all handouts  Please read over the following fact sheets that you were given. MRSA Information

## 2016-09-25 MED ORDER — BUPIVACAINE LIPOSOME 1.3 % IJ SUSP
20.0000 mL | INTRAMUSCULAR | Status: AC
  Administered 2016-09-28: 20 mL
  Filled 2016-09-25: qty 20

## 2016-09-25 MED ORDER — SODIUM CHLORIDE 0.9 % IV SOLN
1000.0000 mg | INTRAVENOUS | Status: AC
  Administered 2016-09-28: 1000 mg via INTRAVENOUS
  Filled 2016-09-25: qty 10

## 2016-09-27 NOTE — Anesthesia Preprocedure Evaluation (Addendum)
Anesthesia Evaluation  Patient identified by MRN, date of birth, ID band Patient awake    Reviewed: Allergy & Precautions, H&P , NPO status , Patient's Chart, lab work & pertinent test results, reviewed documented beta blocker date and time   Airway Mallampati: II  TM Distance: >3 FB Neck ROM: Full    Dental no notable dental hx. (+) Upper Dentures, Lower Dentures, Dental Advisory Given   Pulmonary sleep apnea and Continuous Positive Airway Pressure Ventilation , former smoker,    Pulmonary exam normal breath sounds clear to auscultation       Cardiovascular hypertension, Pt. on medications and Pt. on home beta blockers  Rhythm:Regular Rate:Normal     Neuro/Psych  Headaches, Anxiety negative psych ROS   GI/Hepatic negative GI ROS, Neg liver ROS,   Endo/Other  negative endocrine ROS  Renal/GU negative Renal ROS  negative genitourinary   Musculoskeletal  (+) Arthritis , Osteoarthritis,    Abdominal   Peds  Hematology negative hematology ROS (+)   Anesthesia Other Findings   Reproductive/Obstetrics negative OB ROS                            Anesthesia Physical Anesthesia Plan  ASA: III  Anesthesia Plan: MAC, Regional and Spinal   Post-op Pain Management:    Induction: Intravenous  Airway Management Planned: Simple Face Mask  Additional Equipment:   Intra-op Plan:   Post-operative Plan:   Informed Consent: I have reviewed the patients History and Physical, chart, labs and discussed the procedure including the risks, benefits and alternatives for the proposed anesthesia with the patient or authorized representative who has indicated his/her understanding and acceptance.   Dental advisory given  Plan Discussed with: CRNA  Anesthesia Plan Comments:         Anesthesia Quick Evaluation

## 2016-09-28 ENCOUNTER — Inpatient Hospital Stay (HOSPITAL_COMMUNITY): Payer: Medicare Other | Admitting: Vascular Surgery

## 2016-09-28 ENCOUNTER — Encounter (HOSPITAL_COMMUNITY)
Admission: RE | Disposition: A | Discharge status: Home / Self Care | Payer: Self-pay | Source: Ambulatory Visit | Attending: Orthopedic Surgery

## 2016-09-28 ENCOUNTER — Inpatient Hospital Stay (HOSPITAL_COMMUNITY)
Admission: RE | Admit: 2016-09-28 | Discharge: 2016-09-30 | DRG: 470 | Disposition: A | Discharge status: Home / Self Care | Payer: Medicare Other | Source: Ambulatory Visit | Attending: Orthopedic Surgery | Admitting: Orthopedic Surgery

## 2016-09-28 ENCOUNTER — Encounter (HOSPITAL_COMMUNITY): Payer: Self-pay | Admitting: *Deleted

## 2016-09-28 ENCOUNTER — Inpatient Hospital Stay (HOSPITAL_COMMUNITY): Payer: Medicare Other | Admitting: Anesthesiology

## 2016-09-28 DIAGNOSIS — Z91018 Allergy to other foods: Secondary | ICD-10-CM

## 2016-09-28 DIAGNOSIS — Z96652 Presence of left artificial knee joint: Secondary | ICD-10-CM

## 2016-09-28 DIAGNOSIS — Z79899 Other long term (current) drug therapy: Secondary | ICD-10-CM | POA: Diagnosis not present

## 2016-09-28 DIAGNOSIS — G4733 Obstructive sleep apnea (adult) (pediatric): Secondary | ICD-10-CM | POA: Diagnosis not present

## 2016-09-28 DIAGNOSIS — Z888 Allergy status to other drugs, medicaments and biological substances status: Secondary | ICD-10-CM | POA: Diagnosis not present

## 2016-09-28 DIAGNOSIS — M1712 Unilateral primary osteoarthritis, left knee: Secondary | ICD-10-CM | POA: Diagnosis not present

## 2016-09-28 DIAGNOSIS — I10 Essential (primary) hypertension: Secondary | ICD-10-CM | POA: Diagnosis not present

## 2016-09-28 DIAGNOSIS — Z87891 Personal history of nicotine dependence: Secondary | ICD-10-CM | POA: Diagnosis not present

## 2016-09-28 DIAGNOSIS — M169 Osteoarthritis of hip, unspecified: Secondary | ICD-10-CM | POA: Diagnosis not present

## 2016-09-28 DIAGNOSIS — G8918 Other acute postprocedural pain: Secondary | ICD-10-CM | POA: Diagnosis not present

## 2016-09-28 HISTORY — PX: TOTAL KNEE ARTHROPLASTY: SHX125

## 2016-09-28 HISTORY — DX: Presence of left artificial knee joint: Z96.652

## 2016-09-28 HISTORY — DX: Dependence on other enabling machines and devices: Z99.89

## 2016-09-28 HISTORY — DX: Obstructive sleep apnea (adult) (pediatric): G47.33

## 2016-09-28 SURGERY — ARTHROPLASTY, KNEE, TOTAL
Anesthesia: Monitor Anesthesia Care | Laterality: Left

## 2016-09-28 MED ORDER — MENTHOL 3 MG MT LOZG: 1.0000 | LOZENGE | OROMUCOSAL | Status: DC | PRN

## 2016-09-28 MED ORDER — FENTANYL CITRATE (PF) 100 MCG/2ML IJ SOLN
50.0000 ug | Freq: Once | INTRAMUSCULAR | Status: AC
  Administered 2016-09-28: 50 ug via INTRAVENOUS
  Filled 2016-09-28: qty 1

## 2016-09-28 MED ORDER — PROPOFOL 10 MG/ML IV BOLUS
INTRAVENOUS | Status: AC
  Filled 2016-09-28: qty 20

## 2016-09-28 MED ORDER — PHENOL 1.4 % MT LIQD: 1.0000 | OROMUCOSAL | Status: DC | PRN

## 2016-09-28 MED ORDER — ONDANSETRON HCL 4 MG/2ML IJ SOLN
INTRAMUSCULAR | Status: DC | PRN
  Administered 2016-09-28: 4 mg via INTRAVENOUS

## 2016-09-28 MED ORDER — SODIUM CHLORIDE 0.9 % IV SOLN: INTRAVENOUS | Status: DC

## 2016-09-28 MED ORDER — ACETAMINOPHEN 325 MG PO TABS: 650.0000 mg | ORAL_TABLET | Freq: Four times a day (QID) | ORAL | Status: DC | PRN

## 2016-09-28 MED ORDER — OXYCODONE HCL 5 MG PO TABS
5.0000 mg | ORAL_TABLET | ORAL | Status: DC | PRN
  Administered 2016-09-29 – 2016-09-30 (×8): 10 mg via ORAL
  Filled 2016-09-28 (×8): qty 2

## 2016-09-28 MED ORDER — AMLODIPINE BESYLATE 10 MG PO TABS
10.0000 mg | ORAL_TABLET | Freq: Every day | ORAL | Status: DC
  Administered 2016-09-29 – 2016-09-30 (×2): 10 mg via ORAL
  Filled 2016-09-28 (×2): qty 1

## 2016-09-28 MED ORDER — METOCLOPRAMIDE HCL 5 MG PO TABS: 5.0000 mg | ORAL_TABLET | Freq: Three times a day (TID) | ORAL | Status: DC | PRN

## 2016-09-28 MED ORDER — CELECOXIB 200 MG PO CAPS
200.0000 mg | ORAL_CAPSULE | Freq: Two times a day (BID) | ORAL | Status: DC
  Administered 2016-09-28 – 2016-09-30 (×5): 200 mg via ORAL
  Filled 2016-09-28 (×5): qty 1

## 2016-09-28 MED ORDER — SODIUM CHLORIDE 0.9 % IV SOLN
1000.0000 mg | Freq: Once | INTRAVENOUS | Status: AC
  Administered 2016-09-28: 1000 mg via INTRAVENOUS
  Filled 2016-09-28: qty 10

## 2016-09-28 MED ORDER — ACETAMINOPHEN 500 MG PO TABS
1000.0000 mg | ORAL_TABLET | Freq: Once | ORAL | Status: AC
  Administered 2016-09-28: 1000 mg via ORAL
  Filled 2016-09-28: qty 2

## 2016-09-28 MED ORDER — FLEET ENEMA 7-19 GM/118ML RE ENEM: 1.0000 | ENEMA | Freq: Once | RECTAL | Status: DC | PRN

## 2016-09-28 MED ORDER — SODIUM CHLORIDE 0.9 % IJ SOLN
INTRAMUSCULAR | Status: DC | PRN
  Administered 2016-09-28: 20 mL via INTRAVENOUS

## 2016-09-28 MED ORDER — CHLORHEXIDINE GLUCONATE 4 % EX LIQD: 60.0000 mL | Freq: Once | CUTANEOUS | Status: DC

## 2016-09-28 MED ORDER — MIDAZOLAM HCL 2 MG/2ML IJ SOLN
INTRAMUSCULAR | Status: AC
  Filled 2016-09-28: qty 2

## 2016-09-28 MED ORDER — METHOCARBAMOL 500 MG PO TABS
500.0000 mg | ORAL_TABLET | Freq: Four times a day (QID) | ORAL | Status: DC | PRN
  Administered 2016-09-28 – 2016-09-30 (×5): 500 mg via ORAL
  Filled 2016-09-28 (×5): qty 1

## 2016-09-28 MED ORDER — FUROSEMIDE 20 MG PO TABS
20.0000 mg | ORAL_TABLET | Freq: Two times a day (BID) | ORAL | Status: DC
  Administered 2016-09-28 – 2016-09-30 (×4): 20 mg via ORAL
  Filled 2016-09-28 (×4): qty 1

## 2016-09-28 MED ORDER — OXYCODONE HCL 5 MG/5ML PO SOLN: 5.0000 mg | Freq: Once | ORAL | Status: DC | PRN

## 2016-09-28 MED ORDER — PAROXETINE HCL 20 MG PO TABS
40.0000 mg | ORAL_TABLET | Freq: Two times a day (BID) | ORAL | Status: DC
  Administered 2016-09-28 – 2016-09-30 (×4): 40 mg via ORAL
  Filled 2016-09-28 (×4): qty 2

## 2016-09-28 MED ORDER — MIDAZOLAM HCL 2 MG/2ML IJ SOLN
1.0000 mg | Freq: Once | INTRAMUSCULAR | Status: AC
  Administered 2016-09-28: 1 mg via INTRAVENOUS
  Filled 2016-09-28: qty 1

## 2016-09-28 MED ORDER — CLONIDINE HCL 0.1 MG PO TABS
0.1000 mg | ORAL_TABLET | Freq: Every day | ORAL | Status: DC
  Administered 2016-09-29 – 2016-09-30 (×2): 0.1 mg via ORAL
  Filled 2016-09-28 (×2): qty 1

## 2016-09-28 MED ORDER — CARVEDILOL 25 MG PO TABS
25.0000 mg | ORAL_TABLET | Freq: Two times a day (BID) | ORAL | Status: DC
  Administered 2016-09-28 – 2016-09-30 (×4): 25 mg via ORAL
  Filled 2016-09-28 (×4): qty 1

## 2016-09-28 MED ORDER — EPHEDRINE SULFATE 50 MG/ML IJ SOLN
INTRAMUSCULAR | Status: DC | PRN
  Administered 2016-09-28 (×3): 5 mg via INTRAVENOUS

## 2016-09-28 MED ORDER — ACETAMINOPHEN 650 MG RE SUPP: 650.0000 mg | Freq: Four times a day (QID) | RECTAL | Status: DC | PRN

## 2016-09-28 MED ORDER — DOCUSATE SODIUM 100 MG PO CAPS
100.0000 mg | ORAL_CAPSULE | Freq: Two times a day (BID) | ORAL | Status: DC
  Administered 2016-09-28 – 2016-09-30 (×4): 100 mg via ORAL
  Filled 2016-09-28 (×4): qty 1

## 2016-09-28 MED ORDER — BISACODYL 5 MG PO TBEC: 5.0000 mg | DELAYED_RELEASE_TABLET | Freq: Every day | ORAL | Status: DC | PRN

## 2016-09-28 MED ORDER — FENTANYL CITRATE (PF) 100 MCG/2ML IJ SOLN: 25.0000 ug | INTRAMUSCULAR | Status: DC | PRN

## 2016-09-28 MED ORDER — FENTANYL CITRATE (PF) 100 MCG/2ML IJ SOLN
INTRAMUSCULAR | Status: AC
  Administered 2016-09-28: 50 ug via INTRAVENOUS
  Filled 2016-09-28: qty 2

## 2016-09-28 MED ORDER — ONDANSETRON HCL 4 MG/2ML IJ SOLN
INTRAMUSCULAR | Status: AC
  Filled 2016-09-28: qty 2

## 2016-09-28 MED ORDER — OXYCODONE HCL ER 10 MG PO T12A
10.0000 mg | EXTENDED_RELEASE_TABLET | Freq: Two times a day (BID) | ORAL | Status: DC
  Administered 2016-09-28 – 2016-09-30 (×5): 10 mg via ORAL
  Filled 2016-09-28 (×5): qty 1

## 2016-09-28 MED ORDER — METOCLOPRAMIDE HCL 5 MG/ML IJ SOLN: 5.0000 mg | Freq: Three times a day (TID) | INTRAMUSCULAR | Status: DC | PRN

## 2016-09-28 MED ORDER — FENTANYL CITRATE (PF) 100 MCG/2ML IJ SOLN
INTRAMUSCULAR | Status: AC
  Filled 2016-09-28: qty 2

## 2016-09-28 MED ORDER — DEXAMETHASONE SODIUM PHOSPHATE 10 MG/ML IJ SOLN
8.0000 mg | Freq: Once | INTRAMUSCULAR | Status: AC
  Administered 2016-09-28: 8 mg via INTRAVENOUS
  Filled 2016-09-28: qty 1

## 2016-09-28 MED ORDER — ALUM & MAG HYDROXIDE-SIMETH 200-200-20 MG/5ML PO SUSP
30.0000 mL | ORAL | Status: DC | PRN
Start: 2016-09-28 — End: 2016-09-30

## 2016-09-28 MED ORDER — BUPIVACAINE-EPINEPHRINE (PF) 0.25% -1:200000 IJ SOLN
INTRAMUSCULAR | Status: DC | PRN
  Administered 2016-09-28: 30 mL via PERINEURAL

## 2016-09-28 MED ORDER — GABAPENTIN 300 MG PO CAPS
300.0000 mg | ORAL_CAPSULE | Freq: Three times a day (TID) | ORAL | Status: DC
  Administered 2016-09-28 – 2016-09-30 (×6): 300 mg via ORAL
  Filled 2016-09-28 (×6): qty 1

## 2016-09-28 MED ORDER — SODIUM CHLORIDE 0.9 % IV SOLN
INTRAVENOUS | Status: DC
  Administered 2016-09-28: 17:00:00 via INTRAVENOUS

## 2016-09-28 MED ORDER — CLONAZEPAM 1 MG PO TABS: 1.0000 mg | ORAL_TABLET | Freq: Every day | ORAL | Status: DC

## 2016-09-28 MED ORDER — CEFAZOLIN SODIUM-DEXTROSE 2-4 GM/100ML-% IV SOLN
2.0000 g | INTRAVENOUS | Status: AC
Start: 2016-09-28 — End: 2016-09-28
  Administered 2016-09-28: 2 g via INTRAVENOUS
  Filled 2016-09-28: qty 100

## 2016-09-28 MED ORDER — SODIUM CHLORIDE 0.9 % IR SOLN
Status: DC | PRN
  Administered 2016-09-28: 3000 mL

## 2016-09-28 MED ORDER — FENTANYL CITRATE (PF) 100 MCG/2ML IJ SOLN
INTRAMUSCULAR | Status: DC | PRN
  Administered 2016-09-28: 50 ug via INTRAVENOUS

## 2016-09-28 MED ORDER — MIDAZOLAM HCL 5 MG/5ML IJ SOLN
INTRAMUSCULAR | Status: DC | PRN
  Administered 2016-09-28 (×2): 1 mg via INTRAVENOUS

## 2016-09-28 MED ORDER — DIPHENHYDRAMINE HCL 12.5 MG/5ML PO ELIX: 12.5000 mg | ORAL_SOLUTION | ORAL | Status: DC | PRN

## 2016-09-28 MED ORDER — LIDOCAINE 2% (20 MG/ML) 5 ML SYRINGE
INTRAMUSCULAR | Status: AC
  Filled 2016-09-28: qty 5

## 2016-09-28 MED ORDER — SENNOSIDES-DOCUSATE SODIUM 8.6-50 MG PO TABS
1.0000 | ORAL_TABLET | Freq: Every evening | ORAL | Status: DC | PRN
Start: 2016-09-28 — End: 2016-09-30

## 2016-09-28 MED ORDER — ZOLPIDEM TARTRATE 5 MG PO TABS: 5.0000 mg | ORAL_TABLET | Freq: Every evening | ORAL | Status: DC | PRN

## 2016-09-28 MED ORDER — CEFAZOLIN IN D5W 1 GM/50ML IV SOLN
1.0000 g | Freq: Four times a day (QID) | INTRAVENOUS | Status: AC
  Administered 2016-09-28 (×2): 1 g via INTRAVENOUS
  Filled 2016-09-28 (×2): qty 50

## 2016-09-28 MED ORDER — ONDANSETRON HCL 4 MG PO TABS: 4.0000 mg | ORAL_TABLET | Freq: Four times a day (QID) | ORAL | Status: DC | PRN

## 2016-09-28 MED ORDER — ONDANSETRON HCL 4 MG/2ML IJ SOLN: 4.0000 mg | Freq: Four times a day (QID) | INTRAMUSCULAR | Status: DC | PRN

## 2016-09-28 MED ORDER — SIMVASTATIN 10 MG PO TABS
5.0000 mg | ORAL_TABLET | Freq: Every day | ORAL | Status: DC
  Administered 2016-09-28 – 2016-09-29 (×2): 5 mg via ORAL
  Filled 2016-09-28 (×2): qty 1

## 2016-09-28 MED ORDER — LISINOPRIL 40 MG PO TABS
40.0000 mg | ORAL_TABLET | Freq: Two times a day (BID) | ORAL | Status: DC
  Administered 2016-09-29 – 2016-09-30 (×3): 40 mg via ORAL
  Filled 2016-09-28 (×3): qty 1

## 2016-09-28 MED ORDER — METHOCARBAMOL 1000 MG/10ML IJ SOLN
500.0000 mg | Freq: Four times a day (QID) | INTRAVENOUS | Status: DC | PRN
  Filled 2016-09-28: qty 5

## 2016-09-28 MED ORDER — DEXAMETHASONE SODIUM PHOSPHATE 10 MG/ML IJ SOLN
10.0000 mg | Freq: Once | INTRAMUSCULAR | Status: AC
  Administered 2016-09-29: 10 mg via INTRAVENOUS
  Filled 2016-09-28: qty 1

## 2016-09-28 MED ORDER — LACTATED RINGERS IV SOLN
INTRAVENOUS | Status: DC
  Administered 2016-09-28 (×2): via INTRAVENOUS

## 2016-09-28 MED ORDER — HYDROMORPHONE HCL 1 MG/ML IJ SOLN
1.0000 mg | INTRAMUSCULAR | Status: DC | PRN
  Administered 2016-09-28 – 2016-09-29 (×2): 1 mg via INTRAVENOUS
  Filled 2016-09-28 (×2): qty 1

## 2016-09-28 MED ORDER — ONDANSETRON HCL 4 MG/2ML IJ SOLN: 4.0000 mg | Freq: Once | INTRAMUSCULAR | Status: DC | PRN

## 2016-09-28 MED ORDER — PROPOFOL 10 MG/ML IV BOLUS
INTRAVENOUS | Status: DC | PRN
  Administered 2016-09-28: 20 mg via INTRAVENOUS

## 2016-09-28 MED ORDER — ASPIRIN EC 325 MG PO TBEC
325.0000 mg | DELAYED_RELEASE_TABLET | Freq: Two times a day (BID) | ORAL | Status: DC
  Administered 2016-09-28 – 2016-09-30 (×5): 325 mg via ORAL
  Filled 2016-09-28 (×5): qty 1

## 2016-09-28 MED ORDER — PROPOFOL 500 MG/50ML IV EMUL
INTRAVENOUS | Status: DC | PRN
  Administered 2016-09-28: 75 ug/kg/min via INTRAVENOUS

## 2016-09-28 MED ORDER — MIDAZOLAM HCL 2 MG/2ML IJ SOLN
INTRAMUSCULAR | Status: AC
  Administered 2016-09-28: 1 mg via INTRAVENOUS
  Filled 2016-09-28: qty 2

## 2016-09-28 MED ORDER — OXYCODONE HCL 5 MG PO TABS: 5.0000 mg | ORAL_TABLET | Freq: Once | ORAL | Status: DC | PRN

## 2016-09-28 SURGICAL SUPPLY — 58 items
BANDAGE ELASTIC 6 VELCRO ST LF (GAUZE/BANDAGES/DRESSINGS) ×3 IMPLANT
BANDAGE ESMARK 6X9 LF (GAUZE/BANDAGES/DRESSINGS) ×1 IMPLANT
BLADE SAGITTAL 13X1.27X60 (BLADE) ×2 IMPLANT
BLADE SAGITTAL 13X1.27X60MM (BLADE) ×1
BLADE SAW SGTL 83.5X18.5 (BLADE) ×3 IMPLANT
BLADE SURG 10 STRL SS (BLADE) ×3 IMPLANT
BNDG ESMARK 6X9 LF (GAUZE/BANDAGES/DRESSINGS) ×3
BOWL SMART MIX CTS (DISPOSABLE) ×3 IMPLANT
CAPT KNEE TOTAL 3 ×3 IMPLANT
CEMENT BONE SIMPLEX SPEEDSET (Cement) ×3 IMPLANT
CLOSURE WOUND 1/2 X4 (GAUZE/BANDAGES/DRESSINGS) ×1
COVER SURGICAL LIGHT HANDLE (MISCELLANEOUS) ×3 IMPLANT
CUFF TOURNIQUET SINGLE 34IN LL (TOURNIQUET CUFF) ×3 IMPLANT
DRAPE EXTREMITY T 121X128X90 (DRAPE) ×3 IMPLANT
DRAPE INCISE IOBAN 66X45 STRL (DRAPES) ×6 IMPLANT
DRAPE PROXIMA HALF (DRAPES) IMPLANT
DRAPE U-SHAPE 47X51 STRL (DRAPES) ×3 IMPLANT
DRSG AQUACEL AG ADV 3.5X10 (GAUZE/BANDAGES/DRESSINGS) ×3 IMPLANT
DRSG PAD ABDOMINAL 8X10 ST (GAUZE/BANDAGES/DRESSINGS) ×3 IMPLANT
DURAPREP 26ML APPLICATOR (WOUND CARE) ×6 IMPLANT
ELECT REM PT RETURN 9FT ADLT (ELECTROSURGICAL) ×3
ELECTRODE REM PT RTRN 9FT ADLT (ELECTROSURGICAL) ×1 IMPLANT
GLOVE BIOGEL PI IND STRL 8.5 (GLOVE) ×2 IMPLANT
GLOVE BIOGEL PI INDICATOR 8.5 (GLOVE) ×4
GLOVE SURG ORTHO 8.0 STRL STRW (GLOVE) ×12 IMPLANT
GOWN STRL REUS W/ TWL LRG LVL3 (GOWN DISPOSABLE) ×1 IMPLANT
GOWN STRL REUS W/ TWL XL LVL3 (GOWN DISPOSABLE) ×2 IMPLANT
GOWN STRL REUS W/TWL 2XL LVL3 (GOWN DISPOSABLE) ×3 IMPLANT
GOWN STRL REUS W/TWL LRG LVL3 (GOWN DISPOSABLE) ×2
GOWN STRL REUS W/TWL XL LVL3 (GOWN DISPOSABLE) ×4
HANDPIECE INTERPULSE COAX TIP (DISPOSABLE) ×2
HOOD PEEL AWAY FACE SHEILD DIS (HOOD) ×9 IMPLANT
KIT BASIN OR (CUSTOM PROCEDURE TRAY) ×3 IMPLANT
KIT ROOM TURNOVER OR (KITS) ×3 IMPLANT
KNEE CAPITATED TOTAL 3 ×1 IMPLANT
MANIFOLD NEPTUNE II (INSTRUMENTS) ×3 IMPLANT
NEEDLE 18GX1X1/2 (RX/OR ONLY) (NEEDLE) ×3 IMPLANT
NEEDLE 22X1 1/2 (OR ONLY) (NEEDLE) ×6 IMPLANT
NS IRRIG 1000ML POUR BTL (IV SOLUTION) ×3 IMPLANT
PACK TOTAL JOINT (CUSTOM PROCEDURE TRAY) ×3 IMPLANT
PACK UNIVERSAL I (CUSTOM PROCEDURE TRAY) ×3 IMPLANT
PAD ARMBOARD 7.5X6 YLW CONV (MISCELLANEOUS) ×6 IMPLANT
SET HNDPC FAN SPRY TIP SCT (DISPOSABLE) ×1 IMPLANT
STAPLER VISISTAT 35W (STAPLE) ×3 IMPLANT
STRIP CLOSURE SKIN 1/2X4 (GAUZE/BANDAGES/DRESSINGS) ×2 IMPLANT
SUCTION FRAZIER HANDLE 10FR (MISCELLANEOUS) ×2
SUCTION TUBE FRAZIER 10FR DISP (MISCELLANEOUS) ×1 IMPLANT
SUT BONE WAX W31G (SUTURE) ×3 IMPLANT
SUT VIC AB 0 CTB1 27 (SUTURE) ×6 IMPLANT
SUT VIC AB 1 CT1 27 (SUTURE) ×4
SUT VIC AB 1 CT1 27XBRD ANBCTR (SUTURE) ×2 IMPLANT
SUT VIC AB 2-0 CT1 27 (SUTURE) ×4
SUT VIC AB 2-0 CT1 TAPERPNT 27 (SUTURE) ×2 IMPLANT
SYR 20CC LL (SYRINGE) ×6 IMPLANT
TOWEL OR 17X24 6PK STRL BLUE (TOWEL DISPOSABLE) ×3 IMPLANT
TOWEL OR 17X26 10 PK STRL BLUE (TOWEL DISPOSABLE) ×3 IMPLANT
WATER STERILE IRR 1000ML POUR (IV SOLUTION) IMPLANT
WRAP KNEE MAXI GEL POST OP (GAUZE/BANDAGES/DRESSINGS) ×3 IMPLANT

## 2016-09-28 NOTE — Progress Notes (Signed)
Set up auto CPAP for pt. h/s use per home regimen, humidity filled with SW and added oxygen port to circuit, uses 2 lpm, RT to monitor.

## 2016-09-28 NOTE — Anesthesia Procedure Notes (Addendum)
Spinal  Patient location during procedure: OR Start time: 09/28/2016 10:50 AM End time: 09/28/2016 10:55 AM Staffing Performed: anesthesiologist  Preanesthetic Checklist Completed: patient identified, site marked, surgical consent, pre-op evaluation, timeout performed, IV checked, risks and benefits discussed and monitors and equipment checked Spinal Block Patient position: sitting Prep: ChloraPrep Patient monitoring: heart rate, cardiac monitor, continuous pulse ox and blood pressure Approach: right paramedian Location: L3-4 Injection technique: single-shot Needle Needle type: Tuohy  Needle gauge: 22 G Needle length: 9 cm Needle insertion depth: 6 cm Assessment Sensory level: T6 Additional Notes 10 mg 0.75% Bupivacaine injected easily

## 2016-09-28 NOTE — Anesthesia Postprocedure Evaluation (Signed)
Anesthesia Post Note  Patient: Marcus King  Procedure(s) Performed: Procedure(s) (LRB): TOTAL KNEE ARTHROPLASTY (Left)  Patient location during evaluation: PACU Anesthesia Type: MAC and Spinal Level of consciousness: awake, awake and alert and oriented Pain management: pain level controlled Vital Signs Assessment: post-procedure vital signs reviewed and stable Respiratory status: spontaneous breathing, nonlabored ventilation and respiratory function stable Cardiovascular status: blood pressure returned to baseline Postop Assessment: spinal receding Anesthetic complications: no    Last Vitals:  Vitals:   09/28/16 1341 09/28/16 1345  BP: 134/77   Pulse: 69 68  Resp: 19 16  Temp:      Last Pain:  Vitals:   09/28/16 1326  TempSrc:   PainSc: 0-No pain                 Jenesa Foresta COKER

## 2016-09-28 NOTE — Progress Notes (Signed)
Orthopedic Tech Progress Note Patient Details:  Marcus King Aug 15, 1941 [id number]  CPM Left Knee CPM Left Knee: On Left Knee Flexion (Degrees): 90 Left Knee Extension (Degrees): 0 Additional Comments: Trapeze bar and foot roll   [name] 09/28/2016, 1:26 PM

## 2016-09-28 NOTE — Anesthesia Procedure Notes (Signed)
Procedure Name: MAC Date/Time: 09/28/2016 10:25 AM Performed by: Jenne Campus Pre-anesthesia Checklist: Patient identified, Emergency Drugs available, Suction available, Patient being monitored and Timeout performed Patient Re-evaluated:Patient Re-evaluated prior to inductionOxygen Delivery Method: Simple face mask

## 2016-09-28 NOTE — Transfer of Care (Signed)
Immediate Anesthesia Transfer of Care Note  Patient: Marcus King  Procedure(s) Performed: Procedure(s): TOTAL KNEE ARTHROPLASTY (Left)  Patient Location: PACU  Anesthesia Type:MAC and Spinal  Level of Consciousness: awake, oriented and patient cooperative  Airway & Oxygen Therapy: Patient Spontanous Breathing and Patient connected to face mask oxygen  Post-op Assessment: Report given to RN and Post -op Vital signs reviewed and stable  Post vital signs: Reviewed  Last Vitals:  Vitals:   09/28/16 0959 09/28/16 1000  BP:    Pulse: (!) 58 (!) 56  Resp: 16 15  Temp:      Last Pain:  Vitals:   09/28/16 0923  TempSrc:   PainSc: 1          Complications: No apparent anesthesia complications

## 2016-09-28 NOTE — Progress Notes (Signed)
Placed pt. on CPAP auto at this time, added 2 lpm oxygen into circuit, tolerating well, RN made aware.

## 2016-09-28 NOTE — Evaluation (Signed)
Physical Therapy Evaluation Patient Details Name: Marcus King MRN: [id number] DOB: 1941-11-03 Today's Date: 09/28/2016   History of Present Illness   75 y.o. male presented with a history of pain in the left knee. s/p Lt TKA  Clinical Impression  Pt is s/p TKA resulting in the deficits listed below (see PT Problem List). Ambulation limited by decreased SaO2 on 2L (88%) with recovery to 90% after 2 minute seated rest. Pt will benefit from skilled PT to increase their independence and safety with mobility to allow discharge to the venue listed below.      Follow Up Recommendations Home health PT;Supervision for mobility/OOB    Equipment Recommendations  None recommended by PT    Recommendations for Other Services OT consult     Precautions / Restrictions Precautions Precautions: Knee;Other (comment) Precaution Booklet Issued: Yes (comment) (exercise handout with precautions) Precaution Comments: desaturated on eval Restrictions Weight Bearing Restrictions: Yes LLE Weight Bearing: Weight bearing as tolerated      Mobility  Bed Mobility Overal bed mobility: Needs Assistance Bed Mobility: Supine to Sit     Supine to sit: Min guard;HOB elevated     General bed mobility comments: for safety and line management; did not recline bed due to lethargy and decr SaO2  Transfers Overall transfer level: Needs assistance Equipment used: Rolling walker (2 wheeled) Transfers: Sit to/from Stand Sit to Stand: Min assist         General transfer comment: vc for safe use of RW; steadying assist as transitioning hands to RW  Ambulation/Gait Ambulation/Gait assistance: Min assist Ambulation Distance (Feet): 3 Feet Assistive device: Rolling walker (2 wheeled) Gait Pattern/deviations: Step-to pattern     General Gait Details: limited due to decr SaO2 on 2L  Stairs            Wheelchair Mobility    Modified Rankin (Stroke Patients Only)       Balance Overall balance  assessment: Needs assistance         Standing balance support: Bilateral upper extremity supported Standing balance-Leahy Scale: Poor Standing balance comment: likely due to meds/anesthesia                             Pertinent Vitals/Pain BP supine, sit, stand with appropriate increase SaO2 90-96% at rest, 2L O2, supine SaO2 88% on 2L with standing transfer   Pain Assessment: 0-10 Pain Score: 5  Pain Location: Lt knee Pain Descriptors / Indicators: Tender;Tightness Pain Intervention(s): Limited activity within patient's tolerance;Monitored during session;Premedicated before session;Repositioned    Home Living Family/patient expects to be discharged to:: Private residence Living Arrangements: Spouse/significant other;Children Available Help at Discharge: Family;Available 24 hours/day Type of Home: House Home Access: Stairs to enter   [employer] of Steps: 1 Home Layout: Two level;Able to live on main level with bedroom/bathroom Home Equipment: Grab bars - tub/shower;Walker - 2 wheels;Bedside commode Additional Comments: wife reports tub is very small and shower seat only fits if turned sideways    Prior Function Level of Independence: Independent               Hand Dominance        Extremity/Trunk Assessment   Upper Extremity Assessment: Defer to OT evaluation           Lower Extremity Assessment: LLE deficits/detail   LLE Deficits / Details: edema from surgery  Cervical / Trunk Assessment: Normal  Communication   Communication: No difficulties  Cognition Arousal/Alertness:  Lethargic;Suspect due to medications Behavior During Therapy: [facility] for tasks assessed/performed Overall Cognitive Status: Within Functional Limits for tasks assessed                      General Comments General comments (skin integrity, edema, etc.): Wife and son present    Exercises Total Joint Exercises Ankle Circles/Pumps: AROM;Left;10  reps Quad Sets: AROM;Left;5 reps   Assessment/Plan    PT Assessment Patient needs continued PT services  PT Problem List Decreased activity tolerance;Decreased balance;Decreased mobility;Decreased knowledge of use of DME;Decreased knowledge of precautions;Cardiopulmonary status limiting activity;Pain          PT Treatment Interventions DME instruction;Gait training;Stair training;Functional mobility training;Therapeutic activities;Therapeutic exercise;Patient/family education    PT Goals (Current goals can be found in the Care Plan section)  Acute Rehab PT Goals Patient Stated Goal: go home tomorrow PT Goal Formulation: With patient Time For Goal Achievement: 10/01/16 Potential to Achieve Goals: Good    Frequency 7X/week   Barriers to discharge        Co-evaluation               End of Session Equipment Utilized During Treatment: Gait belt;Oxygen Activity Tolerance: Treatment limited secondary to medical complications (Comment) (decr SaO2 with activity) Patient left: in chair;with call bell/phone within reach;with nursing/sitter in room;with family/visitor present Nurse Communication: Mobility status;Other (comment) (decr SaO2)         Time: [id number] PT Time Calculation (min) (ACUTE ONLY): 26 min   Charges:   PT Evaluation $PT Eval Low Complexity: 1 Procedure PT Treatments $Therapeutic Activity: 8-22 mins   PT G Codes:        Traniya Prichett 10/06/2016, 5:47 PM Pager [phone]

## 2016-09-28 NOTE — Op Note (Signed)
TOTAL KNEE REPLACEMENT OPERATIVE NOTE:  09/28/2016  3:09 PM  PATIENT:  Marcus King  75 y.o. male  PRE-OPERATIVE DIAGNOSIS:  primary osteoarthritis left knee  POST-OPERATIVE DIAGNOSIS:  primary osteoarthritis left knee  PROCEDURE:  Procedure(s): TOTAL KNEE ARTHROPLASTY  SURGEON:  Surgeon(s): [name], MD  PHYSICIAN ASSISTANT: Carlyon Shadow, [facility]   ANESTHESIA:   spinal  DRAINS: Hemovac  SPECIMEN: None  COUNTS:  Correct  TOURNIQUET:   Total Tourniquet Time Documented: Thigh (Left) - 41 minutes Total: Thigh (Left) - 41 minutes   DICTATION:  Indication for procedure:    The patient is a 75 y.o. male who has failed conservative treatment for primary osteoarthritis left knee.  Informed consent was obtained prior to anesthesia. The risks versus benefits of the operation were explain and in a way the patient can, and did, understand.   On the implant demand matching protocol, this patient scored 10.  Therefore, this patient was not receive a polyethylene insert with vitamin E which is a high demand implant.  Description of procedure:     The patient was taken to the operating room and placed under anesthesia.  The patient was positioned in the usual fashion taking care that all body parts were adequately padded and/or protected.  I foley catheter was not placed.  A tourniquet was applied and the leg prepped and draped in the usual sterile fashion.  The extremity was exsanguinated with the esmarch and tourniquet inflated to 350 mmHg.  Pre-operative range of motion was normal.  The knee was in 5 degree of mild varus.  A midline incision approximately 6-7 inches long was made with a #10 blade.  A new blade was used to make a parapatellar arthrotomy going 2-3 cm into the quadriceps tendon, over the patella, and alongside the medial aspect of the patellar tendon.  A synovectomy was then performed with the #10 blade and forceps. I then elevated the deep MCL off the medial tibial  metaphysis subperiosteally around to the semimembranosus attachment.    I everted the patella and used calipers to measure patellar thickness.  I used the reamer to ream down to appropriate thickness to recreate the native thickness.  I then removed excess bone with the rongeur and sagittal saw.  I used the appropriately sized template and drilled the three lug holes.  I then put the trial in place and measured the thickness with the calipers to ensure recreation of the native thickness.  The trial was then removed and the patella subluxed and the knee brought into flexion.  A homan retractor was place to retract and protect the patella and lateral structures.  A Z-retractor was place medially to protect the medial structures.  The extra-medullary alignment system was used to make cut the tibial articular surface perpendicular to the anamotic axis of the tibia and in 3 degrees of posterior slope.  The cut surface and alignment jig was removed.  I then used the intramedullary alignment guide to make a 6 valgus cut on the distal femur.  I then marked out the epicondylar axis on the distal femur.  The posterior condylar axis measured 3 degrees.  I then used the anterior referencing sizer and measured the femur to be a size 11.  The 4-In-1 cutting block was screwed into place in external rotation matching the posterior condylar angle, making our cuts perpendicular to the epicondylar axis.  Anterior, posterior and chamfer cuts were made with the sagittal saw.  The cutting block and cut pieces were  removed.  A lamina spreader was placed in 90 degrees of flexion.  The ACL, PCL, menisci, and posterior condylar osteophytes were removed.  A 11 mm spacer blocked was found to offer good flexion and extension gap balance after minimal in degree releasing.   The scoop retractor was then placed and the femoral finishing block was pinned in place.  The small sagittal saw was used as well as the lug drill to finish the  femur.  The block and cut surfaces were removed and the medullary canal hole filled with autograft bone from the cut pieces.  The tibia was delivered forward in deep flexion and external rotation.  A size G tray was selected and pinned into place centered on the medial 1/3 of the tibial tubercle.  The reamer and keel was used to prepare the tibia through the tray.    I then trialed with the size 11 femur, size G tibia, a 11 mm insert and the 38 patella.  I had excellent flexion/extension gap balance, excellent patella tracking.  Flexion was full and beyond 120 degrees; extension was zero.  These components were chosen and the staff opened them to me on the back table while the knee was lavaged copiously and the cement mixed.  The soft tissue was infiltrated with 60cc of exparel 1.3% through a 21 gauge needle.  I cemented in the components and removed all excess cement.  The polyethylene tibial component was snapped into place and the knee placed in extension while cement was hardening.  The capsule was infilltrated with 30cc of .25% Marcaine with epinephrine.  A hemovac was place in the joint exiting superolaterally.  A pain pump was place superomedially superficial to the arthrotomy.  Once the cement was hard, the tourniquet was let down.  Hemostasis was obtained.  The arthrotomy was closed with figure-8 #1 vicryl sutures.  The deep soft tissues were closed with #0 vicryls and the subcuticular layer closed with a running #2-0 vicryl.  The skin was reapproximated and closed with skin staples.  The wound was dressed with xeroform, 4 x4's, 2 ABD sponges, a single layer of webril and a TED stocking.   The patient was then awakened, extubated, and taken to the recovery room in stable condition.  BLOOD LOSS:  300cc DRAINS: 1 hemovac, 1 pain catheter COMPLICATIONS:  None.  PLAN OF CARE: Admit to inpatient   PATIENT DISPOSITION:  PACU - hemodynamically stable.   Delay start of Pharmacological VTE agent  (>24hrs) due to surgical blood loss or risk of bleeding:  not applicable  Please fax a copy of this op note to my office at [phone] (please only include page 1 and 2 of the Case Information op note)

## 2016-09-28 NOTE — H&P (Signed)
Marcus King MRN:  [id number] DOB/SEX:  01-Aug-1941/male  CHIEF COMPLAINT:  Painful left Knee  HISTORY: Patient is a 75 y.o. male presented with a history of pain in the left knee. Onset of symptoms was gradual starting a few years ago with gradually worsening course since that time. Patient has been treated conservatively with over-the-counter NSAIDs and activity modification. Patient currently rates pain in the knee at 10 out of 10 with activity. There is pain at night.  PAST MEDICAL HISTORY: There are no active problems to display for this patient.  Past Medical History:  Diagnosis Date  . Anxiety   . Food poisoning   . Headache    sinus  . Hypertension   . Sleep apnea    Past Surgical History:  Procedure Laterality Date  . COLON SURGERY  2014  . COLOSTOMY REVERSAL  2014  . EYE SURGERY Bilateral    cataract surgery  . HERNIA REPAIR     x2  . PROSTATE SURGERY  2006  . SEPTOPLASTY       MEDICATIONS:   No prescriptions prior to admission.    ALLERGIES:   Allergies  Allergen Reactions  . Beef-Derived Products Diarrhea  . Clonazepam Other (See Comments)    High doses cause drunk feeling (>1 mg)  . Monosodium Glutamate Other (See Comments)    MSG (soups and oriental foods)    REVIEW OF SYSTEMS:  A comprehensive review of systems was negative except for: Musculoskeletal: positive for arthralgias and stiff joints   FAMILY HISTORY:  No family history on file.  SOCIAL HISTORY:   Social History  Substance Use Topics  . Smoking status: Former Smoker    Years: 20.00    Types: Cigarettes  . Smokeless tobacco: Never Used  . Alcohol use No     EXAMINATION:  Vital signs in last 24 hours:    There were no vitals taken for this visit.  General Appearance:    Alert, cooperative, no distress, appears stated age  Head:    Normocephalic, without obvious abnormality, atraumatic  Eyes:    PERRL, conjunctiva/corneas clear, EOM's intact, fundi    benign, both eyes        Ears:    Normal TM's and external ear canals, both ears  Nose:   Nares normal, septum midline, mucosa normal, no drainage    or sinus tenderness  Throat:   Lips, mucosa, and tongue normal; teeth and gums normal  Neck:   Supple, symmetrical, trachea midline, no adenopathy;       thyroid:  No enlargement/tenderness/nodules; no carotid   bruit or JVD  Back:     Symmetric, no curvature, ROM normal, no CVA tenderness  Lungs:     Clear to auscultation bilaterally, respirations unlabored  Chest wall:    No tenderness or deformity  Heart:    Regular rate and rhythm, S1 and S2 normal, no murmur, rub   or gallop  Abdomen:     Soft, non-tender, bowel sounds active all four quadrants,    no masses, no organomegaly  Genitalia:    Normal male without lesion, discharge or tenderness  Rectal:    Normal tone, normal prostate, no masses or tenderness;   guaiac negative stool  Extremities:   Extremities normal, atraumatic, no cyanosis or edema  Pulses:   2+ and symmetric all extremities  Skin:   Skin color, texture, turgor normal, no rashes or lesions  Lymph nodes:   Cervical, supraclavicular, and axillary nodes normal  Neurologic:  CNII-XII intact. Normal strength, sensation and reflexes      throughout     Musculoskeletal:  ROM 0-120, Ligaments intact,  Imaging Review Plain radiographs demonstrate severe degenerative joint disease of the left knee. The overall alignment is neutral. The bone quality appears to be excellent for age and reported activity level.  Assessment/Plan: Primary osteoarthritis, left knee   The patient history, physical examination and imaging studies are consistent with advanced degenerative joint disease of the left knee. The patient has failed conservative treatment.  The clearance notes were reviewed.  After discussion with the patient it was felt that Total Knee Replacement was indicated. The procedure,  risks, and benefits of total knee arthroplasty were presented and  reviewed. The risks including but not limited to aseptic loosening, infection, blood clots, vascular injury, stiffness, patella tracking problems complications among others were discussed. The patient acknowledged the explanation, agreed to proceed with the plan. Donia Ast 09/28/2016, 6:30 AM

## 2016-09-29 ENCOUNTER — Encounter (HOSPITAL_COMMUNITY): Payer: Self-pay | Admitting: Orthopedic Surgery

## 2016-09-29 LAB — CBC
HEMATOCRIT: 39.2 % (ref 39.0–52.0)
Hemoglobin: 13.2 g/dL (ref 13.0–17.0)
MCH: 30.4 pg (ref 26.0–34.0)
MCHC: 33.7 g/dL (ref 30.0–36.0)
MCV: 90.3 fL (ref 78.0–100.0)
Platelets: 238 10*3/uL (ref 150–400)
RBC: 4.34 MIL/uL (ref 4.22–5.81)
RDW: 12.9 % (ref 11.5–15.5)
WBC: 15.1 10*3/uL — AB (ref 4.0–10.5)

## 2016-09-29 LAB — BASIC METABOLIC PANEL
ANION GAP: 8 (ref 5–15)
BUN: 25 mg/dL — ABNORMAL HIGH (ref 6–20)
CALCIUM: 9 mg/dL (ref 8.9–10.3)
CO2: 27 mmol/L (ref 22–32)
Chloride: 104 mmol/L (ref 101–111)
Creatinine, Ser: 1.22 mg/dL (ref 0.61–1.24)
GFR calc Af Amer: >60 mL/min (ref >60)
GFR calc non Af Amer: 56 mL/min — ABNORMAL LOW (ref >60)
GLUCOSE: 116 mg/dL — AB (ref 65–99)
Potassium: 3.9 mmol/L (ref 3.5–5.1)
Sodium: 139 mmol/L (ref 135–145)

## 2016-09-29 MED ORDER — ASPIRIN 325 MG PO TBEC
325.0000 mg | DELAYED_RELEASE_TABLET | Freq: Two times a day (BID) | ORAL | 0 refills | Status: DC
Start: 2016-09-29 — End: 2017-08-11

## 2016-09-29 MED ORDER — METHOCARBAMOL 500 MG PO TABS: 500.0000 mg | ORAL_TABLET | Freq: Four times a day (QID) | ORAL | 0 refills | Status: DC | PRN

## 2016-09-29 MED ORDER — OXYCODONE HCL 5 MG PO TABS: 5.0000 mg | ORAL_TABLET | ORAL | 0 refills | Status: DC | PRN

## 2016-09-29 MED ORDER — HYDROMORPHONE HCL 2 MG/ML IJ SOLN: 1.0000 mg | INTRAMUSCULAR | Status: DC | PRN

## 2016-09-29 NOTE — Progress Notes (Signed)
Informed Dr. Parks Ranger patient's Oxygen is dropping to 88-92 on 3L.  Dropped to 37 when working with PT.  Dr. Lonie Peak to keep patient over night.

## 2016-09-29 NOTE — Progress Notes (Signed)
Placed pt. on CPAP for h/s, humidity filled, placed CPAP to 12 from auto per request, oxygen placed into circuit at 3 lpm, RT to monitor.

## 2016-09-29 NOTE — Progress Notes (Signed)
Orthopedic Tech Progress Note Patient Details:  Marcus King 07/04/1941 [id number]  Patient ID: [name], male   DOB: 23-May-1941, 75 y.o.   MRN: [id number] Applied cpm 0-60  Karolee Stamps 09/29/2016, 5:37 AM

## 2016-09-29 NOTE — Anesthesia Procedure Notes (Addendum)
Anesthesia Regional Block:  Adductor canal block  Pre-Anesthetic Checklist: ,, timeout performed, Correct Patient, Correct Site, Correct Laterality, Correct Procedure, Correct Position, site marked, Risks and benefits discussed,  Surgical consent,  Pre-op evaluation,  At surgeon's request and post-op pain management  Laterality: Left  Prep: chloraprep       Needles:   Needle Type: Stimulator Needle - 80      Needle Gauge: 21 and 21 G    Additional Needles:  Procedures: ultrasound guided (picture in chart) Adductor canal block Narrative:  Start time: 09/29/2016 10:15 AM End time: 09/29/2016 10:20 AM Injection made incrementally with aspirations every 5 mL.  Performed by: Personally   Additional Notes: 20 cc 0.75% Naropin injected easily

## 2016-09-29 NOTE — Progress Notes (Signed)
Physical Therapy Treatment Patient Details Name: Marcus King MRN: [id number] DOB: May 26, 1941 Today's Date: 09/29/2016    History of Present Illness  75 y.o. male presented with a history of pain in the left knee. s/p Lt TKA    PT Comments    Pt progressed gait, but remains to require supplemental during treatment.  Will f/u this pm to progress mobility.    Follow Up Recommendations  Home health PT;Supervision for mobility/OOB     Equipment Recommendations  None recommended by PT    Recommendations for Other Services       Precautions / Restrictions Precautions Precautions: Knee;Other (comment) Precaution Comments: desaturated during supine exercises.   Restrictions Weight Bearing Restrictions: Yes LLE Weight Bearing: Weight bearing as tolerated    Mobility  Bed Mobility Overal bed mobility: Needs Assistance Bed Mobility: Supine to Sit;Sit to Supine     Supine to sit: Supervision Sit to supine: Supervision   General bed mobility comments: Pt required cues for hand placement but no physical assistance needed  Transfers Overall transfer level: Needs assistance Equipment used: Rolling walker (2 wheeled) Transfers: Sit to/from Stand Sit to Stand: Min guard         General transfer comment: VCs for hand placement and foot position.  Cues for safety, mildly dizzy upon standing, reports this is chronic from allergies.    Ambulation/Gait Ambulation/Gait assistance: Min assist Ambulation Distance (Feet): 200 Feet Assistive device: Rolling walker (2 wheeled) Gait Pattern/deviations: Step-through pattern;Decreased stride length;Shuffle;Trunk flexed     General Gait Details: Cues for L heel strike and L knee extension in stance phase to avoid buckling.  Pt required cues assist for slow buckling x2.  Pt educated for posture.     Stairs            Wheelchair Mobility    Modified Rankin (Stroke Patients Only)       Balance Overall balance assessment:  Needs assistance   Sitting balance-Leahy Scale: Poor       Standing balance-Leahy Scale: Poor                      Cognition Arousal/Alertness: Awake/alert Behavior During Therapy: WFL for tasks assessed/performed Overall Cognitive Status: Within Functional Limits for tasks assessed                      Exercises Total Joint Exercises Ankle Circles/Pumps: AROM;Both;10 reps;Supine Quad Sets: AROM;Left;10 reps;Supine Heel Slides: AROM;Left;10 reps;Supine Hip ABduction/ADduction: AROM;Left;10 reps;Supine Straight Leg Raises: AROM;Left;10 reps;Supine    General Comments        Pertinent Vitals/Pain Pain Assessment: 0-10 Pain Score: 5  Pain Location: L knee Pain Descriptors / Indicators: Tender;Tightness Pain Intervention(s): Monitored during session;Repositioned    Home Living                      Prior Function            PT Goals (current goals can now be found in the care plan section) Acute Rehab PT Goals Patient Stated Goal: go home tomorrow Potential to Achieve Goals: Good Progress towards PT goals: Progressing toward goals    Frequency    7X/week      PT Plan Current plan remains appropriate    Co-evaluation             End of Session Equipment Utilized During Treatment: Gait belt;Oxygen Activity Tolerance: Patient tolerated treatment well Patient left: in chair;with call bell/phone within reach  Time: [id number] PT Time Calculation (min) (ACUTE ONLY): 28 min  Charges:  $Gait Training: 8-22 mins $Therapeutic Exercise: 8-22 mins                    G Codes:      Cristela Blue 2016-10-25, 1:31 PM  Governor Rooks, PTA pager [phone]

## 2016-09-29 NOTE — Progress Notes (Signed)
Orthopedic Tech Progress Note Patient Details:  Marcus King Apr 18, 1941 [id number] Put patient in CPM machine, 1440 CPM Left Knee CPM Left Knee: On Left Knee Flexion (Degrees): 90 Left Knee Extension (Degrees): 0 Additional Comments: Adjusted machine patient was having pain   [name] 09/29/2016, 2:45 PM

## 2016-09-29 NOTE — Progress Notes (Addendum)
SPORTS MEDICINE AND JOINT REPLACEMENT  Marcus Mulch, MD    Carlyon Shadow, PA-C [address], [place], [place]  [address]                             [phone]   PROGRESS NOTE  Subjective:  negative for Chest Pain  negative for Shortness of Breath  negative for Nausea/Vomiting   negative for Calf Pain  negative for Bowel Movement   Tolerating Diet: yes         Patient reports pain as 4 on 0-10 scale.    Objective: Vital signs in last 24 hours:   Patient Vitals for the past 24 hrs:  BP Temp Temp src Pulse Resp SpO2 Height Weight  09/28/16 2245 - - - - - 93 % - -  09/28/16 2239 106/73 97.7 F (36.5 C) Oral 78 16 94 % - -  09/28/16 2131 - - - - - 93 % - -  09/28/16 1700 133/86 97.1 F (36.2 C) Oral 80 16 90 % - -  09/28/16 1400 - - - 69 12 91 % - -  09/28/16 1356 140/79 97.6 F (36.4 C) - 71 16 (!) 89 % - -  09/28/16 1345 - - - 68 16 (!) 89 % - -  09/28/16 1341 134/77 - - 69 19 92 % - -  09/28/16 1330 - - - 68 15 95 % - -  09/28/16 1326 136/83 - - 66 17 96 % - -  09/28/16 1315 - - - 66 16 99 % - -  09/28/16 1311 (!) 147/84 - - 69 15 99 % - -  09/28/16 1300 - - - 68 19 99 % - -  09/28/16 1256 (!) 145/87 - - 62 15 97 % - -  09/28/16 1245 - - - (!) 58 12 97 % - -  09/28/16 1241 132/84 - - 62 16 97 % - -  09/28/16 1230 - - - (!) 59 14 96 % - -  09/28/16 1226 140/78 97.3 F (36.3 C) - (!) 59 (!) 9 91 % - -  09/28/16 1000 - - - (!) 56 15 98 % - -  09/28/16 0959 - - - (!) 58 16 98 % - -  09/28/16 0958 117/65 - - (!) 55 16 99 % - -  09/28/16 0957 - - - (!) 55 11 97 % - -  09/28/16 0956 - - - (!) 57 15 97 % - -  09/28/16 0955 - - - (!) 57 18 98 % - -  09/28/16 0954 - - - (!) 57 15 96 % - -  09/28/16 0953 106/63 - - (!) 56 14 95 % - -  09/28/16 0952 - - - (!) 55 (!) 8 98 % - -  09/28/16 0951 - - - (!) 54 (!) 7 98 % - -  09/28/16 0950 - - - (!) 58 (!) 9 100 % - -  09/28/16 0949 - - - (!) 59 11 98 % - -  09/28/16 0947 - - - (!) 59 - 100 % - -  09/28/16 0946 - - -  (!) 58 10 100 % - -  09/28/16 0945 - - - (!) 56 10 98 % - -  09/28/16 0944 - - - (!) 55 16 99 % - -  09/28/16 0915 113/70 98 F (36.7 C) Oral 61 18 96 % 5\' 10"  (1.778 m)  86.3 kg (190 lb 3 oz)    @flow {1959:LAST@   Intake/Output from previous day:   10/16 0701 - 10/17 0700 In: 1560 [P.O.:360; I.V.:1200] Out: 275 [Urine:225]   Intake/Output this shift:   10/16 1901 - 10/17 0700 In: -  Out: 225 [Urine:225]   Intake/Output      10/16 0701 - 10/17 0700   P.O. 360   I.V. (mL/kg) 1200 (13.9)   Total Intake(mL/kg) 1560 (18.1)   Urine (mL/kg/hr) 225   Blood 50   Total Output 275   Net +1285       Urine Occurrence 3 x      LABORATORY DATA: No results for input(s): WBC, HGB, HCT, PLT in the last 168 hours. No results for input(s): NA, K, CL, CO2, BUN, CREATININE, GLUCOSE, CALCIUM in the last 168 hours. No results found for: INR, PROTIME  Examination:  General appearance: alert, cooperative and no distress Extremities: extremities normal, atraumatic, no cyanosis or edema  Wound Exam: clean, dry, intact   Drainage:  None: wound tissue dry  Motor Exam: Quadriceps and Hamstrings Intact  Sensory Exam: Superficial Peroneal, Deep Peroneal and Tibial normal   Assessment:    1 Day Post-Op  Procedure(s) (LRB): TOTAL KNEE ARTHROPLASTY (Left)  ADDITIONAL DIAGNOSIS:  Active Problems:   S/P total knee replacement     Plan: Physical Therapy as ordered Weight Bearing as Tolerated (WBAT)  DVT Prophylaxis:  Aspirin  DISCHARGE PLAN: Home  DISCHARGE NEEDS: HHPT  Patient doing well, expected D/C today       Donia Ast 09/29/2016, 7:00 AM

## 2016-09-29 NOTE — Addendum Note (Signed)
Addendum  created 09/29/16 0907 by Roberts Gaudy, MD   Anesthesia Intra Blocks edited, Child order released for a procedure order, Sign clinical note

## 2016-09-29 NOTE — Progress Notes (Signed)
Physical Therapy Treatment Patient Details Name: Marcus King MRN: [id number] DOB: 07-03-1941 Today's Date: 09/29/2016    History of Present Illness  75 y.o. male presented with a history of pain in the left knee. s/p Lt TKA    PT Comments    Pt remains with balance deficits during gait.  Will perform tx in am to progress stairs.  Pt educated on need for assistance from son.    Follow Up Recommendations  Home health PT;Supervision for mobility/OOB     Equipment Recommendations  None recommended by PT    Recommendations for Other Services       Precautions / Restrictions Precautions Precautions: Knee Precaution Booklet Issued: No Precaution Comments: Reviewed knee precautions during ADL, no pillow under knee, and use of bone foam. Restrictions Weight Bearing Restrictions: Yes LLE Weight Bearing: Weight bearing as tolerated    Mobility  Bed Mobility Overal bed mobility: Needs Assistance Bed Mobility: Sit to Supine     Supine to sit: Supervision;HOB elevated Sit to supine: Supervision   General bed mobility comments: Pt required cues for hand placement but no physical assistance needed  Transfers Overall transfer level: Needs assistance Equipment used: Rolling walker (2 wheeled) Transfers: Sit to/from Stand Sit to Stand: Min assist         General transfer comment: VCs for hand placement and foot position.  Cues for pushing from seated surface vs. pulling on RW.    Ambulation/Gait Ambulation/Gait assistance: Min assist Ambulation Distance (Feet): 300 Feet Assistive device: Rolling walker (2 wheeled) Gait Pattern/deviations: Step-through pattern;Trunk flexed;Shuffle     General Gait Details: Cues for sequencing and upper trunk control.  pt leaning forward anteriorly and required cues to stand erect to avoid LOB.  LOB x2 forwards.     Stairs            Wheelchair Mobility    Modified Rankin (Stroke Patients Only)       Balance Overall balance  assessment: Needs assistance Sitting-balance support: No upper extremity supported;Feet supported Sitting balance-Leahy Scale: Poor     Standing balance support: Single extremity supported;During functional activity Standing balance-Leahy Scale: Poor Standing balance comment: Able to remove single UE for pericare but required min assist to maintain balance.                    Cognition Arousal/Alertness: Awake/alert Behavior During Therapy: WFL for tasks assessed/performed Overall Cognitive Status: Within Functional Limits for tasks assessed                      Exercises      General Comments        Pertinent Vitals/Pain Pain Assessment: 0-10 Pain Score: 6  Pain Location: L knee Pain Descriptors / Indicators: Operative site guarding;Sore Pain Intervention(s): Limited activity within patient's tolerance;Other (comment);Repositioned    Home Living Family/patient expects to be discharged to:: Private residence Living Arrangements: Spouse/significant other;Children Available Help at Discharge: Family;Available 24 hours/day Type of Home: House Home Access: Stairs to enter   Home Layout: Two level;Able to live on main level with bedroom/bathroom Home Equipment: Grab bars - tub/shower;Walker - 2 wheels;Bedside commode Additional Comments: wife reports tub is very small and shower seat only fits if turned sideways    Prior Function Level of Independence: Independent          PT Goals (current goals can now be found in the care plan section) Acute Rehab PT Goals Patient Stated Goal: go home tomorrow Potential to  Achieve Goals: Good Progress towards PT goals: Progressing toward goals    Frequency           PT Plan Current plan remains appropriate    Co-evaluation             End of Session Equipment Utilized During Treatment: Gait belt;Oxygen Activity Tolerance: Patient tolerated treatment well Patient left: in chair;with call bell/phone  within reach     Time: 1728-1749 PT Time Calculation (min) (ACUTE ONLY): 21 min  Charges:  $Gait Training: 8-22 mins                    G Codes:      Cristela Blue 2016-10-23, 5:59 PM Governor Rooks, PTA pager [phone]

## 2016-09-29 NOTE — Evaluation (Signed)
Occupational Therapy Evaluation Patient Details Name: Marcus King MRN: [id number] DOB: [birth year] Today's Date: 09/29/2016    History of Present Illness  75 y.o. male presented with a history of pain in the left knee. s/p Lt TKA   Clinical Impression   PTA, pt was independent with all ADL and IADL. Pt currently requires min assist with LB ADL and functional mobility. Pt continues to desaturate with activity, dropping to 84% SpO2 on 3 L O2 following toilet transfer. SpO2 returned to 92% with pursed lip breathing techniques on 3L O2. RN aware. Recommend D/C home with 24 hour assistance from wife with no OT follow-up needed. Pt would benefit from continued OT services while admitted. OT will continue to follow acutely with a focus on LB ADL and tub transfer. Pt has all DME needs met.     Follow Up Recommendations  No OT follow up;Supervision/Assistance - 24 hour    Equipment Recommendations  None recommended by OT       Precautions / Restrictions Precautions Precautions: Knee Precaution Booklet Issued: No Precaution Comments: Reviewed knee precautions during ADL, no pillow under knee, and use of bone foam. Restrictions Weight Bearing Restrictions: Yes LLE Weight Bearing: Weight bearing as tolerated      Mobility Bed Mobility Overal bed mobility: Needs Assistance Bed Mobility: Supine to Sit     Supine to sit: Supervision;HOB elevated Sit to supine: Supervision   General bed mobility comments: Pt required cues for hand placement but no physical assistance needed  Transfers Overall transfer level: Needs assistance Equipment used: Rolling walker (2 wheeled) Transfers: Sit to/from Stand Sit to Stand: Min assist         General transfer comment: VCs for hand placement and foot position.  Cues for safety, mildly dizzy upon standing, reports this is chronic from allergies.      Balance Overall balance assessment: Needs assistance Sitting-balance support: No upper extremity  supported;Feet supported Sitting balance-Leahy Scale: Poor     Standing balance support: Single extremity supported;During functional activity Standing balance-Leahy Scale: Poor Standing balance comment: Able to remove single UE for pericare but required min assist to maintain balance.                            ADL Overall ADL's : Needs assistance/impaired     Grooming: Wash/dry hands;Standing;Min guard   Upper Body Bathing: Supervision/ safety;Sitting;Set up   Lower Body Bathing: Sit to/from stand;Minimal assistance   Upper Body Dressing : Supervision/safety;Set up;Sitting   Lower Body Dressing: Minimal assistance;Sit to/from stand   Toilet Transfer: Minimal assistance;Ambulation;BSC;RW   Toileting- Clothing Manipulation and Hygiene: Minimal assistance;Sit to/from stand       Functional mobility during ADLs: Minimal assistance;Rolling walker;Cueing for safety;Cueing for sequencing General ADL Comments: Educated pt and wife on ADL with knee precautions, pursed lip breathing techniques, and safety with ADL transfers.               Pertinent Vitals/Pain Pain Assessment: 0-10 Pain Score: 5  Pain Location: L knee Pain Descriptors / Indicators: Operative site guarding;Sore Pain Intervention(s): Monitored during session;Repositioned     Hand Dominance Right   Extremity/Trunk Assessment Upper Extremity Assessment Upper Extremity Assessment: Overall WFL for tasks assessed   Lower Extremity Assessment Lower Extremity Assessment: LLE deficits/detail LLE Deficits / Details: Decreased strength and ROM post-operatively.       Communication Communication Communication: No difficulties   Cognition Arousal/Alertness: Awake/alert Behavior During Therapy: WFL for tasks assessed/performed  Overall Cognitive Status: Within Functional Limits for tasks assessed                                Home Living Family/patient expects to be discharged to::  Private residence Living Arrangements: Spouse/significant other;Children Available Help at Discharge: Family;Available 24 hours/day Type of Home: House Home Access: Stairs to enter [employer] of Steps: 1   Home Layout: Two level;Able to live on main level with bedroom/bathroom     Bathroom Shower/Tub: Tub/shower unit;Curtain Shower/tub characteristics: Architectural technologist: Standard     Home Equipment: Grab bars - tub/shower;Walker - 2 wheels;Bedside commode   Additional Comments: wife reports tub is very small and shower seat only fits if turned sideways      Prior Functioning/Environment Level of Independence: Independent                 OT Problem List: Decreased strength;Decreased range of motion;Decreased activity tolerance;Impaired balance (sitting and/or standing);Pain;Decreased safety awareness;Decreased knowledge of use of DME or AE;Decreased knowledge of precautions   OT Treatment/Interventions: Self-care/ADL training;Therapeutic exercise;DME and/or AE instruction;Balance training;Patient/family education    OT Goals(Current goals can be found in the care plan section) Acute Rehab OT Goals Patient Stated Goal: go home tomorrow OT Goal Formulation: With patient/family Time For Goal Achievement: 10/13/16 Potential to Achieve Goals: Good ADL Goals Pt Will Perform Lower Body Bathing: with modified independence;sit to/from stand Pt Will Perform Lower Body Dressing: sit to/from stand;with modified independence Pt Will Transfer to Toilet: with modified independence;ambulating;bedside commode Pt Will Perform Toileting - Clothing Manipulation and hygiene: with modified independence;sit to/from stand Pt Will Perform Tub/Shower Transfer: with supervision;3 in 1;rolling walker;ambulating  OT Frequency: Min 2X/week    End of Session Equipment Utilized During Treatment: Gait belt;Rolling walker CPM Left Knee CPM Left Knee: Off Left Knee Flexion (Degrees):  90 Left Knee Extension (Degrees): 0 Additional Comments: Adjusted machine patient was having pain Nurse Communication: Other (comment) (O2 saturation)  Activity Tolerance: Patient tolerated treatment well Patient left: in chair;with call bell/phone within reach;with family/visitor present   Time: 1555-1630 OT Time Calculation (min): 35 min Charges:  OT General Charges $OT Visit: 1 Procedure OT Evaluation $OT Eval Moderate Complexity: 1 Procedure OT Treatments $Self Care/Home Management : 8-22 mins  Norman Herrlich, OTR/L [phone] 09/29/2016, 4:50 PM

## 2016-09-30 LAB — CBC
HCT: 39.8 % (ref 39.0–52.0)
HEMOGLOBIN: 13 g/dL (ref 13.0–17.0)
MCH: 29.6 pg (ref 26.0–34.0)
MCHC: 32.7 g/dL (ref 30.0–36.0)
MCV: 90.7 fL (ref 78.0–100.0)
PLATELETS: 229 10*3/uL (ref 150–400)
RBC: 4.39 MIL/uL (ref 4.22–5.81)
RDW: 13.1 % (ref 11.5–15.5)
WBC: 16.4 10*3/uL — AB (ref 4.0–10.5)

## 2016-09-30 NOTE — Progress Notes (Signed)
Occupational Therapy Treatment Patient Details Name: Marcus King MRN: [id number] DOB: Mar 08, 1941 Today's Date: 09/30/2016    History of present illness  75 y.o. male presented with a history of pain in the left knee. s/p Lt TKA   OT comments  Pt with good progress with OT and currently requiring min guard assist for functional mobility and LB ADL. Additionally, pt required mod assist on first attempt with tub transfer and min assist with second. Family present and engaged in education this date concerning safety during ADL, tub transfers, dressing techniques, and use of RW during ADL. SpO2 levels on room air desaturated after activity to 79%, but improved to 90% with VC's for breathing techniques. RN aware. Educated pt and family on energy conservation post-acute D/C. Pt has no further acute OT needs. Pt has all DME needs met. OT signing off.   Follow Up Recommendations  No OT follow up;Supervision/Assistance - 24 hour    Equipment Recommendations  None recommended by OT       Precautions / Restrictions Precautions Precautions: Knee Precaution Booklet Issued: No Precaution Comments: Reviewed knee precautions during ADL, no pillow under knee, and use of bone foam. Restrictions Weight Bearing Restrictions: Yes LLE Weight Bearing: Weight bearing as tolerated       Mobility Bed Mobility Overal bed mobility: Needs Assistance Bed Mobility: Sit to Supine     Supine to sit: Supervision;HOB elevated Sit to supine: Supervision   General bed mobility comments: Out of bed in chair on OT arrival.  Transfers Overall transfer level: Needs assistance Equipment used: Rolling walker (2 wheeled) Transfers: Sit to/from Stand Sit to Stand: Min guard         General transfer comment: Cues for sequencing and RW placement.      Balance Overall balance assessment: Needs assistance Sitting-balance support: No upper extremity supported;Feet supported Sitting balance-Leahy Scale: Poor      Standing balance support: Single extremity supported;During functional activity Standing balance-Leahy Scale: Poor Standing balance comment: Able to remove single UE but continues to require assist to maintain balance.                   ADL Overall ADL's : Needs assistance/impaired                 Upper Body Dressing : Set up;Sitting   Lower Body Dressing: Min guard;Sit to/from stand   Toilet Transfer: Min guard;Ambulation;BSC;RW   Toileting- Water quality scientist and Hygiene: Min guard;Sit to/from stand   Tub/ Shower Transfer: Minimal assistance;Moderate assistance;Ambulation;3 in 1;Rolling walker Tub/Shower Transfer Details (indicate cue type and reason): Mod assist first attempt, min assist second Functional mobility during ADLs: Min guard;Rolling walker;Cueing for sequencing General ADL Comments: Educated pt and family on pursed lip breathing techniques, knee precautions during ADL, and safe tub transfer.      Vision                     Perception     Praxis      Cognition   Behavior During Therapy: [facility] for tasks assessed/performed Overall Cognitive Status: Within Functional Limits for tasks assessed                         Exercises Total Joint Exercises Ankle Circles/Pumps: AROM;Both;10 reps;Supine Quad Sets: AROM;Left;10 reps;Supine Heel Slides: AROM;Left;10 reps;Supine Hip ABduction/ADduction: AROM;Left;10 reps;Supine Straight Leg Raises: AROM;Left;10 reps;Supine Long Arc Quad: AROM;Seated;10 reps;Left Goniometric ROM: 93 degrees flexion.  Pertinent Vitals/ Pain       Pain Assessment: Faces Pain Score: 6  Faces Pain Scale: Hurts little more Pain Location: L knee Pain Descriptors / Indicators: Aching;Sore Pain Intervention(s): Limited activity within patient's tolerance;Monitored during session;Repositioned         Frequency  Min 2X/week        Progress Toward Goals  OT Goals(current goals can now be  found in the care plan section)  Progress towards OT goals: Progressing toward goals  Acute Rehab OT Goals Patient Stated Goal: go home today OT Goal Formulation: With patient/family Time For Goal Achievement: 10/13/16 Potential to Achieve Goals: Good ADL Goals Pt Will Perform Lower Body Bathing: with modified independence;sit to/from stand Pt Will Perform Lower Body Dressing: sit to/from stand;with modified independence Pt Will Transfer to Toilet: with modified independence;ambulating;bedside commode Pt Will Perform Toileting - Clothing Manipulation and hygiene: with modified independence;sit to/from stand Pt Will Perform Tub/Shower Transfer: with supervision;3 in 1;rolling walker;ambulating  Plan Discharge plan remains appropriate       End of Session Equipment Utilized During Treatment: Gait belt;Rolling walker;Oxygen   Activity Tolerance Patient tolerated treatment well;Other (comment) (Monitored SpO2 throughout and provided breaks as needed.)   Patient Left in chair;with call bell/phone within reach;with family/visitor present   Nurse Communication Other (comment) (O2 Saturation)        Time: 4473-9584 OT Time Calculation (min): 59 min  Charges: OT General Charges $OT Visit: 1 Procedure OT Treatments $Self Care/Home Management : 53-67 mins  Norman Herrlich, OTR/L [phone] 09/30/2016, 12:04 PM

## 2016-09-30 NOTE — Care Management Note (Signed)
Case Management Note  Patient Details  Name: Marcus King MRN: [id number] Date of Birth: May 30, 1941  Subjective/Objective:   75 yr old gentleman s/p left total knee arthroplasty.                 Action/Plan: Patient was    Expected Discharge Date:                  Expected Discharge Plan:  [place]  In-House Referral:     Discharge planning Services  CM Consult  Post Acute Care Choice:  Durable Medical Equipment, Home Health Choice offered to:  Patient  DME Arranged:  3-N-1, Walker rolling, CPM DME Agency:  [facility]  HH Arranged:  PT [place] Agency:  [facility] (now [facility])  Status of Service:  Completed, signed off  If discussed at [place] of Stay Meetings, dates discussed:    Additional Comments:  Ninfa Meeker, RN 09/30/2016, 9:09 AM

## 2016-09-30 NOTE — Progress Notes (Signed)
Orthopedic Tech Progress Note Patient Details:  Marcus King 10-30-1941 [id number]  Patient ID: Marcus King, male   DOB: 01-30-1941, 75 y.o.   MRN: [id number] Applied cpm 0-90  Karolee Stamps 09/30/2016, 5:58 AM

## 2016-09-30 NOTE — Consult Note (Signed)
            [facility] CM Primary Care Navigator  09/30/2016  Marcus King Jan 27, 1941 [id number]  Went to see patient today for possible discharge needs but nursing staff states  patient just got discharged.   Primary care provider's office contacted [facility]) to notify of patient's discharge and need for post hospital follow-up and possible transition of care.        For additional questions please contact:  Edwena Felty A. Taleigh Gero, BSN, RN-BC [facility] PRIMARY CARE Navigator Cell: [phone]

## 2016-09-30 NOTE — Progress Notes (Signed)
Physical Therapy Treatment Patient Details Name: Marcus King MRN: [id number] DOB: [birth year] Today's Date: 09/30/2016    History of Present Illness  75 y.o. male presented with a history of pain in the left knee. s/p Lt TKA    PT Comments    Pt progressing well with no LOB.  PA entered room during session and ask to leave O2 off of patient.  Will f/u in pm for stair training.    Follow Up Recommendations  Home health PT;Supervision for mobility/OOB     Equipment Recommendations  None recommended by PT    Recommendations for Other Services       Precautions / Restrictions Precautions Precautions: Knee Precaution Booklet Issued: No Precaution Comments: Reviewed knee precautions during ADL, no pillow under knee, and use of bone foam. Restrictions Weight Bearing Restrictions: Yes LLE Weight Bearing: Weight bearing as tolerated    Mobility  Bed Mobility Overal bed mobility: Needs Assistance Bed Mobility: Sit to Supine     Supine to sit: Supervision;HOB elevated Sit to supine: Supervision   General bed mobility comments: Pt required cues for hand placement but no physical assistance needed  Transfers Overall transfer level: Needs assistance Equipment used: Rolling walker (2 wheeled) Transfers: Sit to/from Stand Sit to Stand: Min assist         General transfer comment: Cues for sequencing and RW placement.    Ambulation/Gait Ambulation/Gait assistance: Min guard Ambulation Distance (Feet): 300 Feet Assistive device: Rolling walker (2 wheeled) Gait Pattern/deviations: Step-through pattern;Trunk flexed;Shuffle   Gait velocity interpretation: Below normal speed for age/gender General Gait Details: Pt required cues for sequencing and upper trunk control.  No LOB during todays session.  Max VCs to stay in RW.     Stairs            Wheelchair Mobility    Modified Rankin (Stroke Patients Only)       Balance Overall balance assessment: Needs  assistance   Sitting balance-Leahy Scale: Poor       Standing balance-Leahy Scale: Fair                      Cognition Arousal/Alertness: Awake/alert Behavior During Therapy: WFL for tasks assessed/performed Overall Cognitive Status: Within Functional Limits for tasks assessed                      Exercises Total Joint Exercises Ankle Circles/Pumps: AROM;Both;10 reps;Supine Quad Sets: AROM;Left;10 reps;Supine Heel Slides: AROM;Left;10 reps;Supine Hip ABduction/ADduction: AROM;Left;10 reps;Supine Straight Leg Raises: AROM;Left;10 reps;Supine Long Arc Quad: AROM;Seated;10 reps;Left Goniometric ROM: 93 degrees flexion.      General Comments        Pertinent Vitals/Pain Pain Assessment: 0-10 Pain Score: 6  Pain Location: L knee Pain Descriptors / Indicators: Aching;Sore Pain Intervention(s): Monitored during session;Repositioned    Home Living                      Prior Function            PT Goals (current goals can now be found in the care plan section) Acute Rehab PT Goals Patient Stated Goal: go home tomorrow Potential to Achieve Goals: Good Progress towards PT goals: Progressing toward goals    Frequency    7X/week      PT Plan Current plan remains appropriate    Co-evaluation             End of Session Equipment Utilized During Treatment: Gait belt;Oxygen  Activity Tolerance: Patient tolerated treatment well Patient left: in chair;with call bell/phone within reach     Time: [id number] PT Time Calculation (min) (ACUTE ONLY): 35 min  Charges:  $Gait Training: 8-22 mins $Therapeutic Exercise: 8-22 mins                    G Codes:      Cristela Blue 10-18-16, 10:38 AM  Governor Rooks, PTA pager [phone]

## 2016-09-30 NOTE — Discharge Summary (Signed)
SPORTS MEDICINE & JOINT REPLACEMENT   Lara Mulch, MD   Carlyon Shadow, PA-C [address], [place], [place]  [address]                             [phone]  PATIENT ID: Marcus King        MRN:  [id number]          DOB/AGE: 03-18-1941 / 75 y.o.    DISCHARGE SUMMARY  ADMISSION DATE:    09/28/2016 DISCHARGE DATE:   09/30/2016   ADMISSION DIAGNOSIS: primary osteoarthritis left knee    DISCHARGE DIAGNOSIS:  primary osteoarthritis left knee    ADDITIONAL DIAGNOSIS: Active Problems:   S/P total knee replacement  Past Medical History:  Diagnosis Date  . Anxiety   . Food poisoning   . Headache    sinus  . Hypertension   . OSA on CPAP   . Sleep apnea     PROCEDURE: Procedure(s): TOTAL KNEE ARTHROPLASTY on 09/28/2016  CONSULTS:    HISTORY:  See H&P in chart  HOSPITAL COURSE:  Marcus King is a 75 y.o. admitted on 09/28/2016 and found to have a diagnosis of primary osteoarthritis left knee.  After appropriate laboratory studies were obtained  they were taken to the operating room on 09/28/2016 and underwent Procedure(s): TOTAL KNEE ARTHROPLASTY.   They were given perioperative antibiotics:  Anti-infectives    Start     Dose/Rate Route Frequency Ordered Stop   09/28/16 1600  ceFAZolin (ANCEF) IVPB 1 g/50 mL premix     1 g 100 mL/hr over 30 Minutes Intravenous Every 6 hours 09/28/16 1433 09/28/16 2110   09/28/16 1000  ceFAZolin (ANCEF) IVPB 2g/100 mL premix     2 g 200 mL/hr over 30 Minutes Intravenous To ShortStay Surgical 09/28/16 0653 09/28/16 1030    .  Patient given tranexamic acid IV or topical and exparel intra-operatively.  Tolerated the procedure well.    POD# 1: Vital signs were stable.  Patient denied Chest pain, shortness of breath, or calf pain.  Patient was started on Lovenox 30 mg subcutaneously twice daily at 8am.  Consults to PT, OT, and care management were made.  The patient was weight bearing as tolerated.  CPM was placed on the operative  leg 0-90 degrees for 6-8 hours a day. When out of the CPM, patient was placed in the foam block to achieve full extension. Incentive spirometry was taught.  Dressing was changed.       POD #2, Continued  PT for ambulation and exercise program.  IV saline locked.  O2 discontinued.    The remainder of the hospital course was dedicated to ambulation and strengthening.   The patient was discharged on 2 Days Post-Op in  Good condition.  Blood products given:none  DIAGNOSTIC STUDIES: Recent vital signs: Patient Vitals for the past 24 hrs:  BP Temp Temp src Pulse Resp SpO2  09/30/16 0800 136/76 - - 79 - -  09/30/16 0533 105/74 98.1 F (36.7 C) Oral 84 16 97 %  09/29/16 2249 - - - - - 92 %  09/29/16 1942 (!) 101/54 97.8 F (36.6 C) Oral 65 16 91 %  09/29/16 1658 103/66 - - 82 - -  09/29/16 1300 139/72 98.8 F (37.1 C) Oral 79 16 90 %       Recent laboratory studies:  Recent Labs  09/29/16 0941 09/30/16 0652  WBC 15.1* 16.4*  HGB 13.2 13.0  HCT 39.2 39.8  PLT 238 229    Recent Labs  09/29/16 0941  NA 139  K 3.9  CL 104  CO2 27  BUN 25*  CREATININE 1.22  GLUCOSE 116*  CALCIUM 9.0   No results found for: INR, PROTIME   Recent Radiographic Studies :  No results found.  DISCHARGE INSTRUCTIONS: Discharge Instructions    CPM    Complete by:  As directed    Continuous passive motion machine (CPM):      Use the CPM from 0 to 90 for 4-6 hours per day.      You may increase by 10 per day.  You may break it up into 2 or 3 sessions per day.      Use CPM for 2 weeks or until you are told to stop.   Call MD / Call 911    Complete by:  As directed    If you experience chest pain or shortness of breath, CALL 911 and be transported to the hospital emergency room.  If you develope a fever above 101 F, pus (white drainage) or increased drainage or redness at the wound, or calf pain, call your surgeon's office.   Constipation Prevention    Complete by:  As directed    Drink  plenty of fluids.  Prune juice may be helpful.  You may use a stool softener, such as Colace (over the counter) 100 mg twice a day.  Use MiraLax (over the counter) for constipation as needed.   Diet - low sodium heart healthy    Complete by:  As directed    Discharge instructions    Complete by:  As directed    INSTRUCTIONS AFTER JOINT REPLACEMENT   Remove items at home which could result in a fall. This includes throw rugs or furniture in walking pathways ICE to the affected joint every three hours while awake for 30 minutes at a time, for at least the first 3-5 days, and then as needed for pain and swelling.  Continue to use ice for pain and swelling. You may notice swelling that will progress down to the foot and ankle.  This is normal after surgery.  Elevate your leg when you are not up walking on it.   Continue to use the breathing machine you got in the hospital (incentive spirometer) which will help keep your temperature down.  It is common for your temperature to cycle up and down following surgery, especially at night when you are not up moving around and exerting yourself.  The breathing machine keeps your lungs expanded and your temperature down.   DIET:  As you were doing prior to hospitalization, we recommend a well-balanced diet.  DRESSING / WOUND CARE / SHOWERING  Keep the surgical dressing until follow up.  The dressing is water proof, so you can shower without any extra covering.  IF THE DRESSING FALLS OFF or the wound gets wet inside, change the dressing with sterile gauze.  Please use good hand washing techniques before changing the dressing.  Do not use any lotions or creams on the incision until instructed by your surgeon.    ACTIVITY  Increase activity slowly as tolerated, but follow the weight bearing instructions below.   No driving for 6 weeks or until further direction given by your physician.  You cannot drive while taking narcotics.  No lifting or carrying greater  than 10 lbs. until further directed by your surgeon. Avoid periods of inactivity such as sitting  longer than an hour when not asleep. This helps prevent blood clots.  You may return to work once you are authorized by your doctor.     WEIGHT BEARING   Weight bearing as tolerated with assist device (walker, cane, etc) as directed, use it as long as suggested by your surgeon or therapist, typically at least 4-6 weeks.   EXERCISES  Results after joint replacement surgery are often greatly improved when you follow the exercise, range of motion and muscle strengthening exercises prescribed by your doctor. Safety measures are also important to protect the joint from further injury. Any time any of these exercises cause you to have increased pain or swelling, decrease what you are doing until you are comfortable again and then slowly increase them. If you have problems or questions, call your caregiver or physical therapist for advice.   Rehabilitation is important following a joint replacement. After just a few days of immobilization, the muscles of the leg can become weakened and shrink (atrophy).  These exercises are designed to build up the tone and strength of the thigh and leg muscles and to improve motion. Often times heat used for twenty to thirty minutes before working out will loosen up your tissues and help with improving the range of motion but do not use heat for the first two weeks following surgery (sometimes heat can increase post-operative swelling).   These exercises can be done on a training (exercise) mat, on the floor, on a table or on a bed. Use whatever works the best and is most comfortable for you.    Use music or television while you are exercising so that the exercises are a pleasant break in your day. This will make your life better with the exercises acting as a break in your routine that you can look forward to.   Perform all exercises about fifteen times, three times per day  or as directed.  You should exercise both the operative leg and the other leg as well.   Exercises include:   Quad Sets - Tighten up the muscle on the front of the thigh (Quad) and hold for 5-10 seconds.   Straight Leg Raises - With your knee straight (if you were given a brace, keep it on), lift the leg to 60 degrees, hold for 3 seconds, and slowly lower the leg.  Perform this exercise against resistance later as your leg gets stronger.  Leg Slides: Lying on your back, slowly slide your foot toward your buttocks, bending your knee up off the floor (only go as far as is comfortable). Then slowly slide your foot back down until your leg is flat on the floor again.  Angel Wings: Lying on your back spread your legs to the side as far apart as you can without causing discomfort.  Hamstring Strength:  Lying on your back, push your heel against the floor with your leg straight by tightening up the muscles of your buttocks.  Repeat, but this time bend your knee to a comfortable angle, and push your heel against the floor.  You may put a pillow under the heel to make it more comfortable if necessary.   A rehabilitation program following joint replacement surgery can speed recovery and prevent re-injury in the future due to weakened muscles. Contact your doctor or a physical therapist for more information on knee rehabilitation.    CONSTIPATION  Constipation is defined medically as fewer than three stools per week and severe constipation as less than one  stool per week.  Even if you have a regular bowel pattern at home, your normal regimen is likely to be disrupted due to multiple reasons following surgery.  Combination of anesthesia, postoperative narcotics, change in appetite and fluid intake all can affect your bowels.   YOU MUST use at least one of the following options; they are listed in order of increasing strength to get the job done.  They are all available over the counter, and you may need to use  some, POSSIBLY even all of these options:    Drink plenty of fluids (prune juice may be helpful) and high fiber foods Colace 100 mg by mouth twice a day  Senokot for constipation as directed and as needed Dulcolax (bisacodyl), take with full glass of water  Miralax (polyethylene glycol) once or twice a day as needed.  If you have tried all these things and are unable to have a bowel movement in the first 3-4 days after surgery call either your surgeon or your primary doctor.    If you experience loose stools or diarrhea, hold the medications until you stool forms back up.  If your symptoms do not get better within 1 week or if they get worse, check with your doctor.  If you experience "the worst abdominal pain ever" or develop nausea or vomiting, please contact the office immediately for further recommendations for treatment.   ITCHING:  If you experience itching with your medications, try taking only a single pain pill, or even half a pain pill at a time.  You can also use Benadryl over the counter for itching or also to help with sleep.   TED HOSE STOCKINGS:  Use stockings on both legs until for at least 2 weeks or as directed by physician office. They may be removed at night for sleeping.  MEDICATIONS:  See your medication summary on the "After Visit Summary" that nursing will review with you.  You may have some home medications which will be placed on hold until you complete the course of blood thinner medication.  It is important for you to complete the blood thinner medication as prescribed.  PRECAUTIONS:  If you experience chest pain or shortness of breath - call 911 immediately for transfer to the hospital emergency department.   If you develop a fever greater that 101 F, purulent drainage from wound, increased redness or drainage from wound, foul odor from the wound/dressing, or calf pain - CONTACT YOUR SURGEON.                                                   FOLLOW-UP APPOINTMENTS:   If you do not already have a post-op appointment, please call the office for an appointment to be seen by your surgeon.  Guidelines for how soon to be seen are listed in your "After Visit Summary", but are typically between 1-4 weeks after surgery.  OTHER INSTRUCTIONS:   Knee Replacement:  Do not place pillow under knee, focus on keeping the knee straight while resting. CPM instructions: 0-90 degrees, 2 hours in the morning, 2 hours in the afternoon, and 2 hours in the evening. Place foam block, curve side up under heel at all times except when in CPM or when walking.  DO NOT modify, tear, cut, or change the foam block in any way.  MAKE SURE YOU:  Understand these instructions.  Get help right away if you are not doing well or get worse.    Thank you for letting us be a part of your medical care team.  It is a privilege we respect greatly.  We hope these instructions will help you stay on track for a fast and full recovery!   Increase activity slowly as tolerated    Complete by:  As directed       DISCHARGE MEDICATIONS:     Medication List    TAKE these medications   amLODipine 10 MG tablet Commonly known as:  NORVASC Take 10 mg by mouth daily.   aspirin 325 MG EC tablet Take 1 tablet (325 mg total) by mouth 2 (two) times daily. What changed:  medication strength  how much to take  when to take this   CALCIUM 600 + D PO Take 1 tablet by mouth.   carvedilol 25 MG tablet Commonly known as:  COREG Take 25 mg by mouth 2 (two) times daily.   clonazePAM 2 MG tablet Commonly known as:  KLONOPIN Take 1-2 mg by mouth daily.   cloNIDine 0.1 MG tablet Commonly known as:  CATAPRES Take 0.1 mg by mouth daily.   furosemide 20 MG tablet Commonly known as:  LASIX Take 20 mg by mouth 2 (two) times daily.   lisinopril 40 MG tablet Commonly known as:  PRINIVIL,ZESTRIL Take 40 mg by mouth 2 (two) times daily.   methocarbamol 500 MG tablet Commonly known as:  ROBAXIN Take 1-2  tablets (500-1,000 mg total) by mouth every 6 (six) hours as needed for muscle spasms.   MULTIPLE VITAMINS PO Take 1 tablet by mouth.   oxyCODONE 5 MG immediate release tablet Commonly known as:  Oxy IR/ROXICODONE Take 1-2 tablets (5-10 mg total) by mouth every 3 (three) hours as needed for breakthrough pain.   PARoxetine 40 MG tablet Commonly known as:  PAXIL Take 40 mg by mouth 2 (two) times daily.   pseudoephedrine 30 MG tablet Commonly known as:  SUDAFED Take 30 mg by mouth 4 (four) times daily.   simvastatin 5 MG tablet Commonly known as:  ZOCOR Take 5 mg by mouth at bedtime.       FOLLOW UP VISIT:    DISPOSITION: HOME VS. SNF  CONDITION:  Good   Donia Ast 09/30/2016, 12:25 PM

## 2016-10-01 DIAGNOSIS — Z7982 Long term (current) use of aspirin: Secondary | ICD-10-CM | POA: Diagnosis not present

## 2016-10-01 DIAGNOSIS — Z96652 Presence of left artificial knee joint: Secondary | ICD-10-CM | POA: Diagnosis not present

## 2016-10-01 DIAGNOSIS — Z9181 History of falling: Secondary | ICD-10-CM | POA: Diagnosis not present

## 2016-10-01 DIAGNOSIS — Z471 Aftercare following joint replacement surgery: Secondary | ICD-10-CM | POA: Diagnosis not present

## 2016-10-01 DIAGNOSIS — Z791 Long term (current) use of non-steroidal anti-inflammatories (NSAID): Secondary | ICD-10-CM | POA: Diagnosis not present

## 2016-10-02 ENCOUNTER — Telehealth: Payer: Self-pay | Admitting: *Deleted

## 2016-10-02 DIAGNOSIS — Z471 Aftercare following joint replacement surgery: Secondary | ICD-10-CM | POA: Diagnosis present

## 2016-10-02 DIAGNOSIS — R600 Localized edema: Secondary | ICD-10-CM | POA: Diagnosis not present

## 2016-10-02 DIAGNOSIS — M1712 Unilateral primary osteoarthritis, left knee: Secondary | ICD-10-CM | POA: Diagnosis not present

## 2016-10-02 DIAGNOSIS — Z96652 Presence of left artificial knee joint: Secondary | ICD-10-CM | POA: Diagnosis not present

## 2016-10-02 DIAGNOSIS — Z791 Long term (current) use of non-steroidal anti-inflammatories (NSAID): Secondary | ICD-10-CM | POA: Diagnosis not present

## 2016-10-02 DIAGNOSIS — Z9181 History of falling: Secondary | ICD-10-CM | POA: Diagnosis not present

## 2016-10-02 DIAGNOSIS — J309 Allergic rhinitis, unspecified: Secondary | ICD-10-CM | POA: Diagnosis not present

## 2016-10-02 DIAGNOSIS — M25662 Stiffness of left knee, not elsewhere classified: Secondary | ICD-10-CM | POA: Diagnosis present

## 2016-10-02 DIAGNOSIS — I10 Essential (primary) hypertension: Secondary | ICD-10-CM | POA: Diagnosis not present

## 2016-10-02 DIAGNOSIS — F411 Generalized anxiety disorder: Secondary | ICD-10-CM | POA: Diagnosis not present

## 2016-10-02 DIAGNOSIS — F419 Anxiety disorder, unspecified: Secondary | ICD-10-CM | POA: Diagnosis not present

## 2016-10-02 DIAGNOSIS — E785 Hyperlipidemia, unspecified: Secondary | ICD-10-CM | POA: Diagnosis not present

## 2016-10-02 DIAGNOSIS — R2681 Unsteadiness on feet: Secondary | ICD-10-CM | POA: Diagnosis not present

## 2016-10-02 DIAGNOSIS — M6281 Muscle weakness (generalized): Secondary | ICD-10-CM | POA: Diagnosis present

## 2016-10-02 DIAGNOSIS — R51 Headache: Secondary | ICD-10-CM | POA: Diagnosis not present

## 2016-10-02 DIAGNOSIS — D72829 Elevated white blood cell count, unspecified: Secondary | ICD-10-CM | POA: Diagnosis not present

## 2016-10-02 DIAGNOSIS — Z7982 Long term (current) use of aspirin: Secondary | ICD-10-CM | POA: Diagnosis not present

## 2016-10-02 DIAGNOSIS — G4733 Obstructive sleep apnea (adult) (pediatric): Secondary | ICD-10-CM | POA: Diagnosis not present

## 2016-10-02 DIAGNOSIS — J329 Chronic sinusitis, unspecified: Secondary | ICD-10-CM | POA: Diagnosis not present

## 2016-10-02 NOTE — Telephone Encounter (Signed)
Mary from [facility] called on behalf of this pt wife stating she can not take care of him at home any longer.  He is having sun downers and not cooperating with discharge plan.  Mary called for advise as they have contacted Dr Ronnie Derby who states his office is not open today.  EDCM informed Stanton Kidney that with pt only have 2 midnight inpatient stay, SNF placement from the ED will be challenging and next to impossible. Stanton Kidney states [facility] will begin [facility] for placement from home.

## 2016-10-05 ENCOUNTER — Encounter: Payer: Self-pay | Admitting: Adult Health

## 2016-10-05 ENCOUNTER — Non-Acute Institutional Stay (SKILLED_NURSING_FACILITY): Payer: Medicare Other | Admitting: Adult Health

## 2016-10-05 DIAGNOSIS — D72829 Elevated white blood cell count, unspecified: Secondary | ICD-10-CM

## 2016-10-05 DIAGNOSIS — Z96652 Presence of left artificial knee joint: Secondary | ICD-10-CM | POA: Diagnosis not present

## 2016-10-05 DIAGNOSIS — E785 Hyperlipidemia, unspecified: Secondary | ICD-10-CM

## 2016-10-05 DIAGNOSIS — I10 Essential (primary) hypertension: Secondary | ICD-10-CM

## 2016-10-05 DIAGNOSIS — J329 Chronic sinusitis, unspecified: Secondary | ICD-10-CM | POA: Diagnosis not present

## 2016-10-05 DIAGNOSIS — G4733 Obstructive sleep apnea (adult) (pediatric): Secondary | ICD-10-CM | POA: Diagnosis not present

## 2016-10-05 DIAGNOSIS — R2681 Unsteadiness on feet: Secondary | ICD-10-CM

## 2016-10-05 DIAGNOSIS — F419 Anxiety disorder, unspecified: Secondary | ICD-10-CM | POA: Diagnosis not present

## 2016-10-05 DIAGNOSIS — M1712 Unilateral primary osteoarthritis, left knee: Secondary | ICD-10-CM | POA: Diagnosis not present

## 2016-10-05 NOTE — Progress Notes (Signed)
Patient ID: Marcus King, male   DOB: Feb 17, 1941, 75 y.o.   MRN: [id number]

## 2016-10-05 NOTE — Progress Notes (Signed)
DATE:  10/05/2016   MRN:  [id number]  BIRTHDAY: Jan 07, 1941  Facility:  [facility] Location:  [facility] Room Number: 902-P  LEVEL OF CARE:  SNF [phone])  Contact Information    Name Relation Home Work Mobile   Pegues,Carolyn Spouse [phone]  [phone]   [place]udi, [place]ender   [phone]       Code Status History    Date Active Date Inactive Code Status Order ID Comments User Context   09/28/2016  2:33 PM 09/30/2016  5:52 PM Full Code [id number]  Donia Ast, PA Inpatient       Chief Complaint  Patient presents with  . Hospitalization Follow-up    HISTORY OF PRESENT ILLNESS:  This is a 75 year old male who has PMH anxiety, hypertension, headache and OSA.  He has primary osteoarthritis of left knee for which he had total knee arthroplasty on 09/28/16.   He was seen in his room with sister and brother-in-law at bedside.  He has been admitted for a short-term rehabilitation.   PAST MEDICAL HISTORY:  Past Medical History:  Diagnosis Date  . Anxiety   . Food poisoning   . Headache    sinus  . Hypertension   . OSA on CPAP   . Sleep apnea      CURRENT MEDICATIONS: Reviewed  Patient's Medications  New Prescriptions   No medications on file  Previous Medications   AMLODIPINE (NORVASC) 10 MG TABLET    Take 10 mg by mouth daily.   ASPIRIN EC 325 MG EC TABLET    Take 1 tablet (325 mg total) by mouth 2 (two) times daily.   CALCIUM CARB-CHOLECALCIFEROL (CALCIUM 600 + D PO)    Take 1 tablet by mouth.   CARVEDILOL (COREG) 25 MG TABLET    Take 25 mg by mouth 2 (two) times daily.   CLONIDINE (CATAPRES) 0.1 MG TABLET    Take 0.1 mg by mouth daily.    FUROSEMIDE (LASIX) 20 MG TABLET    Take 20 mg by mouth 2 (two) times daily.   LORAZEPAM (ATIVAN) 1 MG TABLET    Take 1 mg by mouth 2 (two) times daily.   METHOCARBAMOL (ROBAXIN) 500 MG TABLET    Take 1-2 tablets (500-1,000 mg total) by mouth every 6 (six) hours as needed for muscle  spasms.   MULTIPLE VITAMINS PO    Take 1 tablet by mouth.   PAROXETINE (PAXIL) 40 MG TABLET    Take 40 mg by mouth 2 (two) times daily.    PSEUDOEPHEDRINE (SUDAFED) 30 MG TABLET    Take 30 mg by mouth 2 (two) times daily.    SIMVASTATIN (ZOCOR) 5 MG TABLET    Take 5 mg by mouth at bedtime.   Modified Medications   No medications on file  Discontinued Medications   CLONAZEPAM (KLONOPIN) 2 MG TABLET    Take 1-2 mg by mouth daily.    LISINOPRIL (PRINIVIL,ZESTRIL) 40 MG TABLET    Take 40 mg by mouth 2 (two) times daily.   OXYCODONE (OXY IR/ROXICODONE) 5 MG IMMEDIATE RELEASE TABLET    Take 1-2 tablets (5-10 mg total) by mouth every 3 (three) hours as needed for breakthrough pain.     Allergies  Allergen Reactions  . Beef-Derived Products Diarrhea  . Clonazepam Other (See Comments)    High doses cause drunk feeling (>1 mg)  . Monosodium Glutamate Other (See Comments)    MSG (soups and oriental foods)  REVIEW OF SYSTEMS:  GENERAL: no change in appetite, no fatigue, no weight changes, no fever, chills or weakness EYES: Denies change in vision, dry eyes, eye pain, itching or discharge EARS: Denies change in hearing, ringing in ears, or earache NOSE: Denies nasal congestion or epistaxis MOUTH and THROAT: Denies oral discomfort, gingival pain or bleeding, pain from teeth or hoarseness   RESPIRATORY: no cough, SOB, DOE, wheezing, hemoptysis CARDIAC: no chest pain, edema or palpitations GI: no abdominal pain, diarrhea, constipation, heart burn, nausea or vomiting GU: Denies dysuria, frequency, hematuria, incontinence, or discharge PSYCHIATRIC: Denies feeling of depression or anxiety. No report of hallucinations, insomnia, paranoia, or agitation    PHYSICAL EXAMINATION  GENERAL APPEARANCE: Well nourished. In no acute distress. Normal body habitus SKIN:  Left knee surgical incision is covered with aquacel dressing, dry and no erythema; left thigh with bruising HEAD: Normal in size and  contour. No evidence of trauma EYES: Lids open and close normally. No blepharitis, entropion or ectropion. PERRL. Conjunctivae are clear and sclerae are white. Lenses are without opacity EARS: Pinnae are normal. Patient hears normal voice tunes of the examiner MOUTH and THROAT: Lips are without lesions. Oral mucosa is moist and without lesions. Tongue is normal in shape, size, and color and without lesions NECK: supple, trachea midline, no neck masses, no thyroid tenderness, no thyromegaly LYMPHATICS: no LAN in the neck, no supraclavicular LAN RESPIRATORY: breathing is even & unlabored, BS CTAB CARDIAC: RRR, no murmur,no extra heart sounds, no edema GI: abdomen soft, normal BS, no masses, no tenderness, no hepatomegaly, no splenomegaly EXTREMITIES:  Able to move X 4 extremities PSYCHIATRIC: Alert and oriented X 3. Affect and behavior are appropriate  LABS/RADIOLOGY: Labs reviewed: Basic Metabolic Panel:  Recent Labs  09/18/16 1215 09/29/16 0941  NA 139 139  K 4.0 3.9  CL 104 104  CO2 26 27  GLUCOSE 89 116*  BUN 16 25*  CREATININE 1.14 1.22  CALCIUM 9.7 9.0   Liver Function Tests:  Recent Labs  09/18/16 1215  AST 23  ALT 22  ALKPHOS 76  BILITOT 0.5  PROT 6.8  ALBUMIN 4.3   CBC:  Recent Labs  09/18/16 1215 09/29/16 0941 09/30/16 0652  WBC 8.6 15.1* 16.4*  NEUTROABS 5.4  --   --   HGB 15.2 13.2 13.0  HCT 46.3 39.2 39.8  MCV 92.0 90.3 90.7  PLT 265 238 229      ASSESSMENT/PLAN:  Unsteady gait - for rehabilitation, PT and OT, for therapeutic strengthening exercises; fall precaution  Primary osteoarthritis of left knee S/P total knee arthroplasty - for rehabilitation, PT and OT, for therapeutic strengthening exercises; LLE wbat; continue ASA 325 mg EC 1 tab PO BID for DVT prophylaxis; Robaxin 500 mg 1-2 tabs PO Q 6 hours PRN for muscle spasm; Oxycodone  IR 5 mg 1-2 tabs PO Q 3 hours PRN; follow-up with Dr. Ronnie Derby, orthopedic surgeon  Hypertension - continue  Amlodipine 10 mg 1 tab PO Q D, Carvedilol 25 mg 1 tab PO BID, Lasix 20 mg 1 tab PO Q D, Lisinopril 40 mg 1 tab PO BID and Clonidine 0.1 mg 1 tab PO Q D; check BMP  Anxiety - mood is stable; continue Klonopin 1 mg 1 tab PO BID and Paxil 40 mg 1 tab PO BID  Hyperlipidemia - continue Zocor 5 mg 1 tab PO Q HS  Leukocytosis - no fever, no source of infection; re-check CBC Lab Results  Component Value Date   WBC 16.4 (H)  09/30/2016   OSA - continue CPAP @ HS  Chronic sinus congestion - continue Sudafed 30 mg 1 tab PO BID      Goals of care:  Short-term rehabilitation    Durenda Age, NP Mobile

## 2016-10-06 ENCOUNTER — Non-Acute Institutional Stay (SKILLED_NURSING_FACILITY): Payer: Medicare Other | Admitting: Internal Medicine

## 2016-10-06 ENCOUNTER — Encounter: Payer: Self-pay | Admitting: Internal Medicine

## 2016-10-06 DIAGNOSIS — J309 Allergic rhinitis, unspecified: Secondary | ICD-10-CM

## 2016-10-06 DIAGNOSIS — M1712 Unilateral primary osteoarthritis, left knee: Secondary | ICD-10-CM

## 2016-10-06 DIAGNOSIS — R600 Localized edema: Secondary | ICD-10-CM | POA: Diagnosis not present

## 2016-10-06 DIAGNOSIS — D72829 Elevated white blood cell count, unspecified: Secondary | ICD-10-CM | POA: Diagnosis not present

## 2016-10-06 DIAGNOSIS — E785 Hyperlipidemia, unspecified: Secondary | ICD-10-CM

## 2016-10-06 DIAGNOSIS — R2681 Unsteadiness on feet: Secondary | ICD-10-CM

## 2016-10-06 DIAGNOSIS — I10 Essential (primary) hypertension: Secondary | ICD-10-CM

## 2016-10-06 DIAGNOSIS — G4733 Obstructive sleep apnea (adult) (pediatric): Secondary | ICD-10-CM

## 2016-10-06 DIAGNOSIS — R51 Headache: Secondary | ICD-10-CM

## 2016-10-06 DIAGNOSIS — R519 Headache, unspecified: Secondary | ICD-10-CM

## 2016-10-06 DIAGNOSIS — F411 Generalized anxiety disorder: Secondary | ICD-10-CM | POA: Diagnosis not present

## 2016-10-06 LAB — BASIC METABOLIC PANEL
BUN: 15 mg/dL (ref 4–21)
CREATININE: 1 mg/dL (ref 0.6–1.3)
Glucose: 96 mg/dL
POTASSIUM: 4.2 mmol/L (ref 3.4–5.3)
Sodium: 142 mmol/L (ref 137–147)

## 2016-10-06 LAB — CBC AND DIFFERENTIAL
HCT: 39 % — AB (ref 41–53)
HEMOGLOBIN: 13 g/dL — AB (ref 13.5–17.5)
Neutrophils Absolute: 9 /uL
Platelets: 309 10*3/uL (ref 150–399)
WBC: 13 10^3/mL

## 2016-10-06 NOTE — Progress Notes (Signed)
LOCATION: [facility]  PCP: Rochel Brome, MD   Code Status: Full Code  Goals of care: Advanced Directive information Advanced Directives 09/29/2016  Does patient have an advance directive? Yes  Type of Advance Directive Living will  Does patient want to make changes to advanced directive? No - Patient declined  Copy of advanced directive(s) in chart? Yes       Extended Emergency Contact Information Primary Emergency Contact: Dominey,Carolyn Address: [address]          [place] [address] [place] Phone: [phone] Mobile Phone: [phone] Relation: Spouse Secondary Emergency Contact: [place] Mobile Phone: [phone] Relation: Son   Allergies  Allergen Reactions  . Beef-Derived Products Diarrhea  . Clonazepam Other (See Comments)    High doses cause drunk feeling (>1 mg)  . Monosodium Glutamate Other (See Comments)    MSG (soups and oriental foods)    Chief Complaint  Patient presents with  . New Admit To SNF    New Admission Visit     HPI:  Patient is a 75 y.o. male seen today for short term rehabilitation post hospital admission from 09/28/16-09/30/16 with left knee osteoarthritis. he underwent left total knee arthroplasty. He is seen in his room today with his wife present.   Review of Systems:  Constitutional: Negative for fever, chills, diaphoresis.  HENT: Negative for congestion, nasal discharge, sore throat, difficulty swallowing. Positive for occasional headaches.  Eyes: Negative for double vision and discharge.  Respiratory: Negative for cough, shortness of breath and wheezing.   Cardiovascular: Negative for chest pain, palpitations, leg swelling.  Gastrointestinal: Negative for heartburn, nausea, vomiting, abdominal pain. Positive for poor appetite. Last bowel movement was this morning.  Genitourinary: Negative for dysuria and flank pain.  Musculoskeletal: Negative for back pain, fall in  the facility.  Skin: Negative for itching, rash.  Neurological: Negative for dizziness. Psychiatric/Behavioral: Negative for depression   Past Medical History:  Diagnosis Date  . Anxiety   . Food poisoning   . Headache    sinus  . Hypertension   . OSA on CPAP   . Sleep apnea    Past Surgical History:  Procedure Laterality Date  . CATARACT EXTRACTION W/ INTRAOCULAR LENS  IMPLANT, BILATERAL Bilateral   . COLON SURGERY  2014  . COLOSTOMY REVERSAL  2014  . HERNIA REPAIR     x2  . JOINT REPLACEMENT    . PROSTATE SURGERY  2006  . SEPTOPLASTY    . TOTAL KNEE ARTHROPLASTY Left 09/28/2016   Procedure: TOTAL KNEE ARTHROPLASTY;  Surgeon: [name], MD;  Location: [facility];  Service: Orthopedics;  Laterality: Left;   Social History:   reports that he has quit smoking. His smoking use included Cigarettes. He quit after 20.00 years of use. He has never used smokeless tobacco. He reports that he does not drink alcohol or use drugs.  No family history on file.  Medications:   Medication List       Accurate as of 10/06/16 12:21 PM. Always use your most recent med list.          amLODipine 10 MG tablet Commonly known as:  NORVASC Take 10 mg by mouth daily.   aspirin 325 MG EC tablet Take 1 tablet (325 mg total) by mouth 2 (two) times daily.   CALCIUM 600 + D PO Take 1 tablet by mouth daily.   carvedilol 25 MG tablet Commonly known as:  COREG Take 25 mg  by mouth 2 (two) times daily.   cetirizine 10 MG tablet Commonly known as:  ZYRTEC Take 10 mg by mouth daily as needed for allergies.   cloNIDine 0.1 MG tablet Commonly known as:  CATAPRES Take 0.1 mg by mouth daily.   furosemide 20 MG tablet Commonly known as:  LASIX Take 20 mg by mouth 2 (two) times daily.   hydrOXYzine 25 MG tablet Commonly known as:  ATARAX/VISTARIL Take 25 mg by mouth 2 (two) times daily as needed.   LORazepam 1 MG tablet Commonly known as:  ATIVAN Take 1 mg by mouth 2 (two) times daily.     methocarbamol 500 MG tablet Commonly known as:  ROBAXIN Take 1-2 tablets (500-1,000 mg total) by mouth every 6 (six) hours as needed for muscle spasms.   MULTIPLE VITAMINS PO Take 1 tablet by mouth daily.   PARoxetine 40 MG tablet Commonly known as:  PAXIL Take 40 mg by mouth 2 (two) times daily.   pseudoephedrine 30 MG tablet Commonly known as:  SUDAFED Take 30 mg by mouth 2 (two) times daily as needed.   simvastatin 5 MG tablet Commonly known as:  ZOCOR Take 5 mg by mouth at bedtime.       Immunizations: Immunization History  Administered Date(s) Administered  . Influenza-Unspecified 09/07/2016     Physical Exam:  Vitals:   10/06/16 1216  BP: 127/75  Pulse: 71  Resp: 18  Temp: 97.9 F (36.6 C)  TempSrc: Oral  SpO2: 95%  Weight: 190 lb 1.6 oz (86.2 kg)  Height: 5\' 10"  (1.778 m)   Body mass index is 27.28 kg/m.  General- elderly male, well built, in no acute distress Head- normocephalic, atraumatic Nose- no maxillary or frontal sinus tenderness, no nasal discharge Throat- moist mucus membrane Eyes- PERRLA, EOMI, no pallor, no icterus, no discharge, normal conjunctiva, normal sclera Neck- no cervical lymphadenopathy Cardiovascular- normal s1,s2, no murmur, trace leg edema Respiratory- bilateral clear to auscultation, no wheeze, no rhonchi, no crackles, no use of accessory muscles Abdomen- bowel sounds present, soft, non tender Musculoskeletal- able to move all 4 extremities, limited left knee range of motion Neurological- alert and oriented to person, place and time Skin- warm and dry, left knee surgical incision with aquacel dressing Psychiatry- normal mood and affect    Labs reviewed: Basic Metabolic Panel:  Recent Labs  09/18/16 1215 09/29/16 0941  NA 139 139  K 4.0 3.9  CL 104 104  CO2 26 27  GLUCOSE 89 116*  BUN 16 25*  CREATININE 1.14 1.22  CALCIUM 9.7 9.0   Liver Function Tests:  Recent Labs  09/18/16 1215  AST 23  ALT 22   ALKPHOS 76  BILITOT 0.5  PROT 6.8  ALBUMIN 4.3   No results for input(s): LIPASE, AMYLASE in the last 8760 hours. No results for input(s): AMMONIA in the last 8760 hours. CBC:  Recent Labs  09/18/16 1215 09/29/16 0941 09/30/16 0652  WBC 8.6 15.1* 16.4*  NEUTROABS 5.4  --   --   HGB 15.2 13.2 13.0  HCT 46.3 39.2 39.8  MCV 92.0 90.3 90.7  PLT 265 238 229     Assessment/Plan   Unsteady gait Will have patient work with PT/OT as tolerated to regain strength and restore function.  Fall precautions are in place.  Left knee OA S/p left total knee arthroplasty. Has orthopedic follow up. Continue oxyIR 5 mg 1-2 tab q3h prn pain. Will have him work with physical therapy and occupational therapy team  to help with gait training and muscle strengthening exercises.fall precautions. Skin care. Encourage to be out of bed. Continue aspirin 325 mg bid for dvt prophylaxis. Continue robaxin 500 mg 1-2 tab q6h prn muscle spasm  Leg edema Leg elevation at rest and ted hose when out of bed for now, monitor. Continue lasix 20 mg bid  Leukocytosis Afebrile. Likely reactive leukocytosis. Monitor cbc  GAD Continue paxil 40 mg bid  HTN Monitor BP, continue coreg 25 mg bid and amlodipine 10 mg daily. Continue clonidine 0.1 mg daily  Sinus headache Chronic, continue sudaphed for now  HLD Continue simvastatin 5 mg qhs  Allergic rhinitis On claritin daily as needed with atarax bid prn  OSA continue CPAP at bedtime    Goals of care: short term rehabilitation   Labs/tests ordered: cbc   Family/ staff Communication: reviewed care plan with patient, his wife and nursing supervisor    Blanchie Serve, MD Internal Medicine [place], [place] [address] Cell Phone (Monday-Friday 8 am - 5 pm): [phone] On Call: [phone] and follow prompts after 5 pm and on weekends Office Phone: [phone] Office Fax:  [phone]

## 2016-10-07 ENCOUNTER — Non-Acute Institutional Stay (SKILLED_NURSING_FACILITY): Payer: Medicare Other | Admitting: Adult Health

## 2016-10-07 ENCOUNTER — Encounter: Payer: Self-pay | Admitting: Adult Health

## 2016-10-07 DIAGNOSIS — F411 Generalized anxiety disorder: Secondary | ICD-10-CM

## 2016-10-07 DIAGNOSIS — M1712 Unilateral primary osteoarthritis, left knee: Secondary | ICD-10-CM

## 2016-10-07 DIAGNOSIS — J309 Allergic rhinitis, unspecified: Secondary | ICD-10-CM

## 2016-10-07 DIAGNOSIS — R600 Localized edema: Secondary | ICD-10-CM | POA: Diagnosis not present

## 2016-10-07 DIAGNOSIS — D72829 Elevated white blood cell count, unspecified: Secondary | ICD-10-CM

## 2016-10-07 DIAGNOSIS — I10 Essential (primary) hypertension: Secondary | ICD-10-CM | POA: Diagnosis not present

## 2016-10-07 DIAGNOSIS — R2681 Unsteadiness on feet: Secondary | ICD-10-CM | POA: Diagnosis not present

## 2016-10-07 DIAGNOSIS — E785 Hyperlipidemia, unspecified: Secondary | ICD-10-CM | POA: Diagnosis not present

## 2016-10-07 DIAGNOSIS — G4733 Obstructive sleep apnea (adult) (pediatric): Secondary | ICD-10-CM | POA: Diagnosis not present

## 2016-10-07 NOTE — Progress Notes (Signed)
Patient ID: Marcus King, male   DOB: [birth year], 75 y.o.   MRN: [id number]    DATE:  10/07/2016   MRN:  [id number]  BIRTHDAY: [birth year]  Facility:  [facility] Home [facility]:  [facility] Room Number: 902-P  LEVEL OF CARE:  SNF [phone])  Contact Information    Name Relation Home Work Mobile   Chausse,Carolyn Spouse [phone]  [phone]   Taro, Rawlinson   [phone]       Code Status History    Date Active Date Inactive Code Status Order ID Comments User Context   09/28/2016  2:33 PM 09/30/2016  5:52 PM Full Code [id number]  Donia Ast, PA Inpatient       Chief Complaint  Patient presents with  . Discharge Note    HISTORY OF PRESENT ILLNESS:  This is a 75 year old male who is for discharge home and will have outpatient rehabilitation.  He has PMH anxiety, hypertension, headache and OSA.  He has primary osteoarthritis of left knee for which he had total knee arthroplasty on 09/28/16.   He was seen today in his room with wife and son @ bedside.   Patient was admitted to this facility for short-term rehabilitation after the patient's recent hospitalization.  Patient has completed SNF rehabilitation and therapy has cleared the patient for discharge.   PAST MEDICAL HISTORY:  Past Medical History:  Diagnosis Date  . Anxiety   . Food poisoning   . Headache    sinus  . Hypertension   . OSA on CPAP      CURRENT MEDICATIONS: Reviewed  Patient's Medications  New Prescriptions   No medications on file  Previous Medications   AMLODIPINE (NORVASC) 10 MG TABLET    Take 10 mg by mouth daily.    ASPIRIN EC 325 MG EC TABLET    Take 1 tablet (325 mg total) by mouth 2 (two) times daily.   CALCIUM CARB-CHOLECALCIFEROL (CALCIUM 600 + D PO)    Take 1 tablet by mouth daily.    CARVEDILOL (COREG) 25 MG TABLET    Take 25 mg by mouth 2 (two) times daily.    CETIRIZINE (ZYRTEC) 10 MG TABLET    Take 10 mg by mouth daily as needed for allergies.     CLONIDINE (CATAPRES) 0.1 MG TABLET    Take 0.1 mg by mouth daily.    FUROSEMIDE (LASIX) 20 MG TABLET    Take 20 mg by mouth 2 (two) times daily.    HYDROXYZINE (ATARAX/VISTARIL) 25 MG TABLET    Take 25 mg by mouth 2 (two) times daily as needed.    LORAZEPAM (ATIVAN) 1 MG TABLET    Take 1 mg by mouth 2 (two) times daily.   METHOCARBAMOL (ROBAXIN) 500 MG TABLET    Take 1-2 tablets (500-1,000 mg total) by mouth every 6 (six) hours as needed for muscle spasms.   MULTIPLE VITAMINS PO    Take 1 tablet by mouth daily.    PAROXETINE (PAXIL) 40 MG TABLET    Take 40 mg by mouth 2 (two) times daily.    PSEUDOEPHEDRINE (SUDAFED) 30 MG TABLET    Take 30 mg by mouth 2 (two) times daily as needed.    SIMVASTATIN (ZOCOR) 5 MG TABLET    Take 5 mg by mouth at bedtime.   Modified Medications   No medications on file  Discontinued Medications   No medications on file     Allergies  Allergen Reactions  .  Beef-Derived Products Diarrhea  . Clonazepam Other (See Comments)    High doses cause drunk feeling (>1 mg)  . Monosodium Glutamate Other (See Comments)    MSG (soups and oriental foods)     REVIEW OF SYSTEMS:  GENERAL: no change in appetite, no fatigue, no weight changes, no fever, chills or weakness EYES: Denies change in vision, dry eyes, eye pain, itching or discharge EARS: Denies change in hearing, ringing in ears, or earache NOSE: Denies nasal congestion or epistaxis MOUTH and THROAT: Denies oral discomfort, gingival pain or bleeding, pain from teeth or hoarseness   RESPIRATORY: no cough, SOB, DOE, wheezing, hemoptysis CARDIAC: no chest pain, edema or palpitations GI: no abdominal pain, diarrhea, constipation, heart burn, nausea or vomiting GU: Denies dysuria, frequency, hematuria, incontinence, or discharge PSYCHIATRIC: Denies feeling of depression or anxiety. No report of hallucinations, insomnia, paranoia, or agitation    PHYSICAL EXAMINATION  GENERAL APPEARANCE: Well nourished. In  no acute distress. Normal body habitus SKIN:  Left knee surgical incision is covered with aquacel dressing, dry and no erythema; left thigh with bruising HEAD: Normal in size and contour. No evidence of trauma EYES: Lids open and close normally. No blepharitis, entropion or ectropion. PERRL. Conjunctivae are clear and sclerae are white. Lenses are without opacity EARS: Pinnae are normal. Patient hears normal voice tunes of the examiner MOUTH and THROAT: Lips are without lesions. Oral mucosa is moist and without lesions. Tongue is normal in shape, size, and color and without lesions NECK: supple, trachea midline, no neck masses, no thyroid tenderness, no thyromegaly LYMPHATICS: no LAN in the neck, no supraclavicular LAN RESPIRATORY: breathing is even & unlabored, BS CTAB CARDIAC: RRR, no murmur,no extra heart sounds, no edema GI: abdomen soft, normal BS, no masses, no tenderness, no hepatomegaly, no splenomegaly EXTREMITIES:  Able to move X 4 extremities PSYCHIATRIC: Alert and oriented X 3. Affect and behavior are appropriate  LABS/RADIOLOGY: Labs reviewed: Basic Metabolic Panel:  Recent Labs  09/18/16 1215 09/29/16 0941 10/06/16  NA 139 139 142  K 4.0 3.9 4.2  CL 104 104  --   CO2 26 27  --   GLUCOSE 89 116*  --   BUN 16 25* 15  CREATININE 1.14 1.22 1.0  CALCIUM 9.7 9.0  --    Liver Function Tests:  Recent Labs  09/18/16 1215  AST 23  ALT 22  ALKPHOS 76  BILITOT 0.5  PROT 6.8  ALBUMIN 4.3   CBC:  Recent Labs  09/18/16 1215 09/29/16 0941 09/30/16 0652 10/06/16  WBC 8.6 15.1* 16.4* 13.0  NEUTROABS 5.4  --   --  9  HGB 15.2 13.2 13.0 13.0*  HCT 46.3 39.2 39.8 39*  MCV 92.0 90.3 90.7  --   PLT 265 238 229 309      ASSESSMENT/PLAN:  Unsteady gait - for outpatient rehabilitation, for therapeutic strengthening exercises; fall precaution  Primary osteoarthritis of left knee S/P total knee arthroplasty - for outpatient rehabilitation, for therapeutic  strengthening exercises; LLE wbat; continue ASA 325 mg EC 1 tab PO BID for DVT prophylaxis; Robaxin 500 mg 1-2 tabs PO Q 6 hours PRN for muscle spasm;  follow-up with Dr. Ronnie Derby, orthopedic surgeon  Hypertension - continue Amlodipine 10 mg 1 tab PO Q D, Carvedilol 25 mg 1 tab PO BID, Lasix 20 mg 1 tab PO BID and Clonidine 0.1 mg 1 tab PO Q D  Anxiety - mood is stable; continue Ativan 1 mg 1 tab PO BID and Paxil 40  mg 1 tab PO BID  Hyperlipidemia - continue Zocor 5 mg 1 tab PO Q HS  Leukocytosis - no fever, no source of infection; wbc went down from 16.4 Lab Results  Component Value Date   WBC 13.0 10/06/2016   OSA - continue CPAP @ HS  Chronic allergic rhinitis - continue Sudafed 30 mg 1 tab PO BID and Zyrtec 10 mg 1 tab PO Q HS  Leg edema - continue Lasix 20 mg 1 tab PO BID       I have filled out patient's discharge paperwork and written prescriptions.  Patient will have outpatient therapy.   DME provided:  None  Total discharge time: Less than 30 minutes  Discharge time involved coordination of the discharge process with social worker, nursing staff and therapy department.      Durenda Age, NP [employer] [phone]

## 2016-10-08 DIAGNOSIS — Z471 Aftercare following joint replacement surgery: Secondary | ICD-10-CM | POA: Diagnosis not present

## 2016-10-08 DIAGNOSIS — M25462 Effusion, left knee: Secondary | ICD-10-CM | POA: Diagnosis not present

## 2016-10-08 DIAGNOSIS — M7989 Other specified soft tissue disorders: Secondary | ICD-10-CM | POA: Diagnosis not present

## 2016-10-08 DIAGNOSIS — Z96652 Presence of left artificial knee joint: Secondary | ICD-10-CM | POA: Diagnosis not present

## 2016-10-09 DIAGNOSIS — M25562 Pain in left knee: Secondary | ICD-10-CM | POA: Diagnosis not present

## 2016-10-09 DIAGNOSIS — Z96652 Presence of left artificial knee joint: Secondary | ICD-10-CM | POA: Diagnosis not present

## 2016-10-09 DIAGNOSIS — M25462 Effusion, left knee: Secondary | ICD-10-CM | POA: Diagnosis not present

## 2016-10-09 DIAGNOSIS — R2689 Other abnormalities of gait and mobility: Secondary | ICD-10-CM | POA: Diagnosis not present

## 2016-10-12 DIAGNOSIS — M25562 Pain in left knee: Secondary | ICD-10-CM | POA: Diagnosis not present

## 2016-10-12 DIAGNOSIS — Z96652 Presence of left artificial knee joint: Secondary | ICD-10-CM | POA: Diagnosis not present

## 2016-10-12 DIAGNOSIS — R2689 Other abnormalities of gait and mobility: Secondary | ICD-10-CM | POA: Diagnosis not present

## 2016-10-12 DIAGNOSIS — M25462 Effusion, left knee: Secondary | ICD-10-CM | POA: Diagnosis not present

## 2016-10-14 DIAGNOSIS — M25562 Pain in left knee: Secondary | ICD-10-CM | POA: Diagnosis not present

## 2016-10-14 DIAGNOSIS — M25462 Effusion, left knee: Secondary | ICD-10-CM | POA: Diagnosis not present

## 2016-10-14 DIAGNOSIS — Z96652 Presence of left artificial knee joint: Secondary | ICD-10-CM | POA: Diagnosis not present

## 2016-10-14 DIAGNOSIS — R2689 Other abnormalities of gait and mobility: Secondary | ICD-10-CM | POA: Diagnosis not present

## 2016-10-14 NOTE — Addendum Note (Signed)
Addendum  created 10/14/16 1741 by Roberts Gaudy, MD   Anesthesia Intra Blocks edited, Sign clinical note

## 2016-10-16 DIAGNOSIS — R2689 Other abnormalities of gait and mobility: Secondary | ICD-10-CM | POA: Diagnosis not present

## 2016-10-16 DIAGNOSIS — Z96652 Presence of left artificial knee joint: Secondary | ICD-10-CM | POA: Diagnosis not present

## 2016-10-16 DIAGNOSIS — M25462 Effusion, left knee: Secondary | ICD-10-CM | POA: Diagnosis not present

## 2016-10-16 DIAGNOSIS — M25562 Pain in left knee: Secondary | ICD-10-CM | POA: Diagnosis not present

## 2016-10-19 DIAGNOSIS — R2689 Other abnormalities of gait and mobility: Secondary | ICD-10-CM | POA: Diagnosis not present

## 2016-10-19 DIAGNOSIS — M25562 Pain in left knee: Secondary | ICD-10-CM | POA: Diagnosis not present

## 2016-10-19 DIAGNOSIS — Z96652 Presence of left artificial knee joint: Secondary | ICD-10-CM | POA: Diagnosis not present

## 2016-10-19 DIAGNOSIS — M25462 Effusion, left knee: Secondary | ICD-10-CM | POA: Diagnosis not present

## 2016-10-21 DIAGNOSIS — M25462 Effusion, left knee: Secondary | ICD-10-CM | POA: Diagnosis not present

## 2016-10-21 DIAGNOSIS — R2689 Other abnormalities of gait and mobility: Secondary | ICD-10-CM | POA: Diagnosis not present

## 2016-10-21 DIAGNOSIS — M25562 Pain in left knee: Secondary | ICD-10-CM | POA: Diagnosis not present

## 2016-10-21 DIAGNOSIS — Z96652 Presence of left artificial knee joint: Secondary | ICD-10-CM | POA: Diagnosis not present

## 2016-10-23 DIAGNOSIS — M25562 Pain in left knee: Secondary | ICD-10-CM | POA: Diagnosis not present

## 2016-10-23 DIAGNOSIS — Z96652 Presence of left artificial knee joint: Secondary | ICD-10-CM | POA: Diagnosis not present

## 2016-10-23 DIAGNOSIS — M25462 Effusion, left knee: Secondary | ICD-10-CM | POA: Diagnosis not present

## 2016-10-23 DIAGNOSIS — R2689 Other abnormalities of gait and mobility: Secondary | ICD-10-CM | POA: Diagnosis not present

## 2016-10-23 DIAGNOSIS — F411 Generalized anxiety disorder: Secondary | ICD-10-CM | POA: Diagnosis not present

## 2016-10-26 DIAGNOSIS — M25562 Pain in left knee: Secondary | ICD-10-CM | POA: Diagnosis not present

## 2016-10-26 DIAGNOSIS — M25462 Effusion, left knee: Secondary | ICD-10-CM | POA: Diagnosis not present

## 2016-10-26 DIAGNOSIS — R2689 Other abnormalities of gait and mobility: Secondary | ICD-10-CM | POA: Diagnosis not present

## 2016-10-26 DIAGNOSIS — Z96652 Presence of left artificial knee joint: Secondary | ICD-10-CM | POA: Diagnosis not present

## 2016-10-28 DIAGNOSIS — M25462 Effusion, left knee: Secondary | ICD-10-CM | POA: Diagnosis not present

## 2016-10-28 DIAGNOSIS — Z96652 Presence of left artificial knee joint: Secondary | ICD-10-CM | POA: Diagnosis not present

## 2016-10-28 DIAGNOSIS — R2689 Other abnormalities of gait and mobility: Secondary | ICD-10-CM | POA: Diagnosis not present

## 2016-10-28 DIAGNOSIS — M25562 Pain in left knee: Secondary | ICD-10-CM | POA: Diagnosis not present

## 2016-10-30 DIAGNOSIS — R2689 Other abnormalities of gait and mobility: Secondary | ICD-10-CM | POA: Diagnosis not present

## 2016-10-30 DIAGNOSIS — M25562 Pain in left knee: Secondary | ICD-10-CM | POA: Diagnosis not present

## 2016-10-30 DIAGNOSIS — Z96652 Presence of left artificial knee joint: Secondary | ICD-10-CM | POA: Diagnosis not present

## 2016-10-30 DIAGNOSIS — M25462 Effusion, left knee: Secondary | ICD-10-CM | POA: Diagnosis not present

## 2016-11-02 DIAGNOSIS — Z96652 Presence of left artificial knee joint: Secondary | ICD-10-CM | POA: Diagnosis not present

## 2016-11-02 DIAGNOSIS — M25462 Effusion, left knee: Secondary | ICD-10-CM | POA: Diagnosis not present

## 2016-11-02 DIAGNOSIS — R2689 Other abnormalities of gait and mobility: Secondary | ICD-10-CM | POA: Diagnosis not present

## 2016-11-02 DIAGNOSIS — M25562 Pain in left knee: Secondary | ICD-10-CM | POA: Diagnosis not present

## 2016-11-04 DIAGNOSIS — R2689 Other abnormalities of gait and mobility: Secondary | ICD-10-CM | POA: Diagnosis not present

## 2016-11-04 DIAGNOSIS — Z96652 Presence of left artificial knee joint: Secondary | ICD-10-CM | POA: Diagnosis not present

## 2016-11-04 DIAGNOSIS — M25462 Effusion, left knee: Secondary | ICD-10-CM | POA: Diagnosis not present

## 2016-11-04 DIAGNOSIS — M25562 Pain in left knee: Secondary | ICD-10-CM | POA: Diagnosis not present

## 2016-11-09 DIAGNOSIS — R2689 Other abnormalities of gait and mobility: Secondary | ICD-10-CM | POA: Diagnosis not present

## 2016-11-09 DIAGNOSIS — Z96652 Presence of left artificial knee joint: Secondary | ICD-10-CM | POA: Diagnosis not present

## 2016-11-09 DIAGNOSIS — M25562 Pain in left knee: Secondary | ICD-10-CM | POA: Diagnosis not present

## 2016-11-09 DIAGNOSIS — M25462 Effusion, left knee: Secondary | ICD-10-CM | POA: Diagnosis not present

## 2016-11-11 DIAGNOSIS — R2689 Other abnormalities of gait and mobility: Secondary | ICD-10-CM | POA: Diagnosis not present

## 2016-11-11 DIAGNOSIS — M25562 Pain in left knee: Secondary | ICD-10-CM | POA: Diagnosis not present

## 2016-11-11 DIAGNOSIS — Z96652 Presence of left artificial knee joint: Secondary | ICD-10-CM | POA: Diagnosis not present

## 2016-11-11 DIAGNOSIS — M25462 Effusion, left knee: Secondary | ICD-10-CM | POA: Diagnosis not present

## 2016-11-18 DIAGNOSIS — F411 Generalized anxiety disorder: Secondary | ICD-10-CM | POA: Diagnosis not present

## 2016-11-18 DIAGNOSIS — B86 Scabies: Secondary | ICD-10-CM | POA: Diagnosis not present

## 2016-11-24 DIAGNOSIS — R600 Localized edema: Secondary | ICD-10-CM | POA: Diagnosis not present

## 2016-11-24 DIAGNOSIS — L300 Nummular dermatitis: Secondary | ICD-10-CM | POA: Diagnosis not present

## 2016-12-01 DIAGNOSIS — I8310 Varicose veins of unspecified lower extremity with inflammation: Secondary | ICD-10-CM | POA: Diagnosis not present

## 2016-12-01 DIAGNOSIS — L300 Nummular dermatitis: Secondary | ICD-10-CM | POA: Diagnosis not present

## 2016-12-01 DIAGNOSIS — L02416 Cutaneous abscess of left lower limb: Secondary | ICD-10-CM | POA: Diagnosis not present

## 2016-12-22 DIAGNOSIS — M62552 Muscle wasting and atrophy, not elsewhere classified, left thigh: Secondary | ICD-10-CM | POA: Diagnosis not present

## 2016-12-22 DIAGNOSIS — Z96652 Presence of left artificial knee joint: Secondary | ICD-10-CM | POA: Diagnosis not present

## 2016-12-22 DIAGNOSIS — M25562 Pain in left knee: Secondary | ICD-10-CM | POA: Diagnosis not present

## 2016-12-22 DIAGNOSIS — R2689 Other abnormalities of gait and mobility: Secondary | ICD-10-CM | POA: Diagnosis not present

## 2016-12-23 DIAGNOSIS — M62552 Muscle wasting and atrophy, not elsewhere classified, left thigh: Secondary | ICD-10-CM | POA: Diagnosis not present

## 2016-12-23 DIAGNOSIS — R2689 Other abnormalities of gait and mobility: Secondary | ICD-10-CM | POA: Diagnosis not present

## 2016-12-23 DIAGNOSIS — Z96652 Presence of left artificial knee joint: Secondary | ICD-10-CM | POA: Diagnosis not present

## 2016-12-23 DIAGNOSIS — M25562 Pain in left knee: Secondary | ICD-10-CM | POA: Diagnosis not present

## 2016-12-25 DIAGNOSIS — M62552 Muscle wasting and atrophy, not elsewhere classified, left thigh: Secondary | ICD-10-CM | POA: Diagnosis not present

## 2016-12-25 DIAGNOSIS — R2689 Other abnormalities of gait and mobility: Secondary | ICD-10-CM | POA: Diagnosis not present

## 2016-12-25 DIAGNOSIS — Z96652 Presence of left artificial knee joint: Secondary | ICD-10-CM | POA: Diagnosis not present

## 2016-12-25 DIAGNOSIS — M25562 Pain in left knee: Secondary | ICD-10-CM | POA: Diagnosis not present

## 2016-12-28 DIAGNOSIS — M25562 Pain in left knee: Secondary | ICD-10-CM | POA: Diagnosis not present

## 2016-12-28 DIAGNOSIS — Z96652 Presence of left artificial knee joint: Secondary | ICD-10-CM | POA: Diagnosis not present

## 2016-12-28 DIAGNOSIS — M62552 Muscle wasting and atrophy, not elsewhere classified, left thigh: Secondary | ICD-10-CM | POA: Diagnosis not present

## 2016-12-28 DIAGNOSIS — R2689 Other abnormalities of gait and mobility: Secondary | ICD-10-CM | POA: Diagnosis not present

## 2016-12-29 DIAGNOSIS — Z96652 Presence of left artificial knee joint: Secondary | ICD-10-CM | POA: Diagnosis not present

## 2016-12-29 DIAGNOSIS — R2689 Other abnormalities of gait and mobility: Secondary | ICD-10-CM | POA: Diagnosis not present

## 2016-12-29 DIAGNOSIS — M62552 Muscle wasting and atrophy, not elsewhere classified, left thigh: Secondary | ICD-10-CM | POA: Diagnosis not present

## 2016-12-29 DIAGNOSIS — M25562 Pain in left knee: Secondary | ICD-10-CM | POA: Diagnosis not present

## 2017-01-01 DIAGNOSIS — M25562 Pain in left knee: Secondary | ICD-10-CM | POA: Diagnosis not present

## 2017-01-01 DIAGNOSIS — M62552 Muscle wasting and atrophy, not elsewhere classified, left thigh: Secondary | ICD-10-CM | POA: Diagnosis not present

## 2017-01-01 DIAGNOSIS — Z96652 Presence of left artificial knee joint: Secondary | ICD-10-CM | POA: Diagnosis not present

## 2017-01-01 DIAGNOSIS — R2689 Other abnormalities of gait and mobility: Secondary | ICD-10-CM | POA: Diagnosis not present

## 2017-01-04 DIAGNOSIS — M62552 Muscle wasting and atrophy, not elsewhere classified, left thigh: Secondary | ICD-10-CM | POA: Diagnosis not present

## 2017-01-04 DIAGNOSIS — R2689 Other abnormalities of gait and mobility: Secondary | ICD-10-CM | POA: Diagnosis not present

## 2017-01-04 DIAGNOSIS — M25562 Pain in left knee: Secondary | ICD-10-CM | POA: Diagnosis not present

## 2017-01-04 DIAGNOSIS — Z96652 Presence of left artificial knee joint: Secondary | ICD-10-CM | POA: Diagnosis not present

## 2017-01-06 DIAGNOSIS — R2689 Other abnormalities of gait and mobility: Secondary | ICD-10-CM | POA: Diagnosis not present

## 2017-01-06 DIAGNOSIS — M25562 Pain in left knee: Secondary | ICD-10-CM | POA: Diagnosis not present

## 2017-01-06 DIAGNOSIS — Z96652 Presence of left artificial knee joint: Secondary | ICD-10-CM | POA: Diagnosis not present

## 2017-01-06 DIAGNOSIS — M62552 Muscle wasting and atrophy, not elsewhere classified, left thigh: Secondary | ICD-10-CM | POA: Diagnosis not present

## 2017-01-07 DIAGNOSIS — Z96652 Presence of left artificial knee joint: Secondary | ICD-10-CM | POA: Diagnosis not present

## 2017-01-07 DIAGNOSIS — Z471 Aftercare following joint replacement surgery: Secondary | ICD-10-CM | POA: Diagnosis not present

## 2017-01-08 DIAGNOSIS — Z96652 Presence of left artificial knee joint: Secondary | ICD-10-CM | POA: Diagnosis not present

## 2017-01-08 DIAGNOSIS — R2689 Other abnormalities of gait and mobility: Secondary | ICD-10-CM | POA: Diagnosis not present

## 2017-01-08 DIAGNOSIS — M62552 Muscle wasting and atrophy, not elsewhere classified, left thigh: Secondary | ICD-10-CM | POA: Diagnosis not present

## 2017-01-08 DIAGNOSIS — M25562 Pain in left knee: Secondary | ICD-10-CM | POA: Diagnosis not present

## 2017-01-12 DIAGNOSIS — Z96652 Presence of left artificial knee joint: Secondary | ICD-10-CM | POA: Diagnosis not present

## 2017-01-12 DIAGNOSIS — M25562 Pain in left knee: Secondary | ICD-10-CM | POA: Diagnosis not present

## 2017-01-12 DIAGNOSIS — R2689 Other abnormalities of gait and mobility: Secondary | ICD-10-CM | POA: Diagnosis not present

## 2017-01-12 DIAGNOSIS — M62552 Muscle wasting and atrophy, not elsewhere classified, left thigh: Secondary | ICD-10-CM | POA: Diagnosis not present

## 2017-01-15 DIAGNOSIS — R2689 Other abnormalities of gait and mobility: Secondary | ICD-10-CM | POA: Diagnosis not present

## 2017-01-15 DIAGNOSIS — M25562 Pain in left knee: Secondary | ICD-10-CM | POA: Diagnosis not present

## 2017-01-15 DIAGNOSIS — M62552 Muscle wasting and atrophy, not elsewhere classified, left thigh: Secondary | ICD-10-CM | POA: Diagnosis not present

## 2017-01-15 DIAGNOSIS — Z96652 Presence of left artificial knee joint: Secondary | ICD-10-CM | POA: Diagnosis not present

## 2017-01-18 DIAGNOSIS — Z471 Aftercare following joint replacement surgery: Secondary | ICD-10-CM | POA: Diagnosis not present

## 2017-01-18 DIAGNOSIS — Z96652 Presence of left artificial knee joint: Secondary | ICD-10-CM | POA: Diagnosis not present

## 2017-02-18 DIAGNOSIS — L300 Nummular dermatitis: Secondary | ICD-10-CM | POA: Diagnosis not present

## 2017-02-18 DIAGNOSIS — R233 Spontaneous ecchymoses: Secondary | ICD-10-CM | POA: Diagnosis not present

## 2017-02-18 DIAGNOSIS — L578 Other skin changes due to chronic exposure to nonionizing radiation: Secondary | ICD-10-CM | POA: Diagnosis not present

## 2017-02-24 DIAGNOSIS — I10 Essential (primary) hypertension: Secondary | ICD-10-CM | POA: Diagnosis not present

## 2017-02-24 DIAGNOSIS — E782 Mixed hyperlipidemia: Secondary | ICD-10-CM | POA: Diagnosis not present

## 2017-03-01 DIAGNOSIS — Z0001 Encounter for general adult medical examination with abnormal findings: Secondary | ICD-10-CM | POA: Diagnosis not present

## 2017-03-01 DIAGNOSIS — E782 Mixed hyperlipidemia: Secondary | ICD-10-CM | POA: Diagnosis not present

## 2017-03-01 DIAGNOSIS — Z6827 Body mass index (BMI) 27.0-27.9, adult: Secondary | ICD-10-CM | POA: Diagnosis not present

## 2017-04-08 DIAGNOSIS — Z96652 Presence of left artificial knee joint: Secondary | ICD-10-CM | POA: Diagnosis not present

## 2017-04-08 DIAGNOSIS — Z471 Aftercare following joint replacement surgery: Secondary | ICD-10-CM | POA: Diagnosis not present

## 2017-05-12 DIAGNOSIS — M7061 Trochanteric bursitis, right hip: Secondary | ICD-10-CM | POA: Diagnosis not present

## 2017-06-02 DIAGNOSIS — E782 Mixed hyperlipidemia: Secondary | ICD-10-CM | POA: Diagnosis not present

## 2017-06-02 DIAGNOSIS — J3089 Other allergic rhinitis: Secondary | ICD-10-CM | POA: Diagnosis not present

## 2017-06-02 DIAGNOSIS — I110 Hypertensive heart disease with heart failure: Secondary | ICD-10-CM | POA: Diagnosis not present

## 2017-06-02 DIAGNOSIS — F411 Generalized anxiety disorder: Secondary | ICD-10-CM | POA: Diagnosis not present

## 2017-06-22 DIAGNOSIS — I110 Hypertensive heart disease with heart failure: Secondary | ICD-10-CM | POA: Diagnosis not present

## 2017-06-22 DIAGNOSIS — E782 Mixed hyperlipidemia: Secondary | ICD-10-CM | POA: Diagnosis not present

## 2017-06-22 DIAGNOSIS — I10 Essential (primary) hypertension: Secondary | ICD-10-CM | POA: Diagnosis not present

## 2017-07-07 DIAGNOSIS — H524 Presbyopia: Secondary | ICD-10-CM | POA: Diagnosis not present

## 2017-07-07 DIAGNOSIS — Z961 Presence of intraocular lens: Secondary | ICD-10-CM | POA: Diagnosis not present

## 2017-07-07 DIAGNOSIS — H52203 Unspecified astigmatism, bilateral: Secondary | ICD-10-CM | POA: Diagnosis not present

## 2017-07-07 DIAGNOSIS — H16223 Keratoconjunctivitis sicca, not specified as Sjogren's, bilateral: Secondary | ICD-10-CM | POA: Diagnosis not present

## 2017-07-20 DIAGNOSIS — I110 Hypertensive heart disease with heart failure: Secondary | ICD-10-CM | POA: Diagnosis not present

## 2017-07-20 DIAGNOSIS — F411 Generalized anxiety disorder: Secondary | ICD-10-CM | POA: Diagnosis not present

## 2017-08-05 DIAGNOSIS — G4733 Obstructive sleep apnea (adult) (pediatric): Secondary | ICD-10-CM | POA: Insufficient documentation

## 2017-08-05 DIAGNOSIS — F419 Anxiety disorder, unspecified: Secondary | ICD-10-CM | POA: Insufficient documentation

## 2017-08-05 DIAGNOSIS — R519 Headache, unspecified: Secondary | ICD-10-CM | POA: Insufficient documentation

## 2017-08-05 DIAGNOSIS — R51 Headache: Secondary | ICD-10-CM

## 2017-08-05 DIAGNOSIS — I119 Hypertensive heart disease without heart failure: Secondary | ICD-10-CM | POA: Insufficient documentation

## 2017-08-05 DIAGNOSIS — A059 Bacterial foodborne intoxication, unspecified: Secondary | ICD-10-CM | POA: Insufficient documentation

## 2017-08-05 DIAGNOSIS — Z9989 Dependence on other enabling machines and devices: Secondary | ICD-10-CM

## 2017-08-05 HISTORY — DX: Hypertensive heart disease without heart failure: I11.9

## 2017-08-11 ENCOUNTER — Ambulatory Visit: Payer: Medicare Other | Admitting: Cardiology

## 2017-08-11 ENCOUNTER — Ambulatory Visit (INDEPENDENT_AMBULATORY_CARE_PROVIDER_SITE_OTHER): Payer: Medicare Other | Admitting: Cardiology

## 2017-08-11 ENCOUNTER — Encounter: Payer: Self-pay | Admitting: Cardiology

## 2017-08-11 VITALS — BP 112/68 | HR 64 | Ht 70.0 in | Wt 193.0 lb

## 2017-08-11 DIAGNOSIS — L578 Other skin changes due to chronic exposure to nonionizing radiation: Secondary | ICD-10-CM | POA: Diagnosis not present

## 2017-08-11 DIAGNOSIS — I119 Hypertensive heart disease without heart failure: Secondary | ICD-10-CM

## 2017-08-11 DIAGNOSIS — E785 Hyperlipidemia, unspecified: Secondary | ICD-10-CM | POA: Diagnosis not present

## 2017-08-11 DIAGNOSIS — L821 Other seborrheic keratosis: Secondary | ICD-10-CM | POA: Diagnosis not present

## 2017-08-11 DIAGNOSIS — L300 Nummular dermatitis: Secondary | ICD-10-CM | POA: Diagnosis not present

## 2017-08-11 HISTORY — DX: Hyperlipidemia, unspecified: E78.5

## 2017-08-11 HISTORY — DX: Hypertensive heart disease without heart failure: I11.9

## 2017-08-11 MED ORDER — HYDRALAZINE HCL 25 MG PO TABS: 12.5000 mg | ORAL_TABLET | Freq: Two times a day (BID) | ORAL | 3 refills | Status: DC

## 2017-08-11 NOTE — Progress Notes (Signed)
Cardiology Office Note:    Date:  08/11/2017   ID:  Marcus King, DOB Apr 12, 1941, MRN [id number]  PCP:  Rochel Brome, MD  Cardiologist:  Shirlee More, MD    Referring MD: Rochel Brome, MD    ASSESSMENT:    1. Hypertensive left ventricular hypertrophy, without heart failure   2. Dyslipidemia   3. Hypertensive heart disease without heart failure    PLAN:    In order of problems listed above:  1. Stable continue multidrug regimen discontinue  And start hydralazine. Recent labs requested from his PCP 2. continue his statin  3. Stable I don't think he requires a repeat ischemia evaluation   Next appointment: 2-3 weeks   Medication Adjustments/Labs and Tests Ordered: Current medicines are reviewed at length with the patient today.  Concerns regarding medicines are outlined above.  Orders Placed This Encounter  Procedures  . EKG 12-Lead   Meds ordered this encounter  Medications  . hydrALAZINE (APRESOLINE) 25 MG tablet    Sig: Take 0.5 tablets (12.5 mg total) by mouth 2 (two) times daily.    Dispense:  60 tablet    Refill:  3    Chief Complaint  Patient presents with  . Follow-up    2 year flup appt.    . Dizziness  . Edema    History of Present Illness:    Marcus King is a 76 y.o. male with a hx of hypertension with LVH and previous normal coronary angiography more than 10 yrs ago last seen more than 2 yrs ago. Compliance with diet, lifestyle and medications: yes His blood pressure remains labile but generally is in range. He has chronic recurrent urticaria tells  Is told by a dermatologist to stop his calcium channel blocker.I'll have him discontinue start hydralazine and see back in a few weeks to reassess his blood pressure. He's had no chest pain shortness of breath sycope or TIA his wife asked me whether dizziness is related to hypertension and I don't think that is the issue here Past Medical History:  Diagnosis Date  . Anxiety   . Chronic pain of left knee  08/11/2016  . Food poisoning   . Headache    sinus  . Hypertension   . OSA on CPAP   . 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 Sleep apnea 08/05/2017    Past Surgical History:  Procedure Laterality Date  . CARDIAC CATHETERIZATION    . CATARACT EXTRACTION W/ INTRAOCULAR LENS  IMPLANT, BILATERAL Bilateral   . COLON SURGERY  2014  . COLOSTOMY REVERSAL  2014  . HERNIA REPAIR     x2  . JOINT REPLACEMENT    . PROSTATE SURGERY  2006  . SEPTOPLASTY    . TOTAL KNEE ARTHROPLASTY Left 09/28/2016   Procedure: TOTAL KNEE ARTHROPLASTY;  Surgeon: [name], MD;  Location: [facility];  Service: Orthopedics;  Laterality: Left;    Current Medications: Current Meds  Medication Sig  . busPIRone (BUSPAR) 10 MG tablet Take 10 mg by mouth 2 (two) times daily.  . carvedilol (COREG) 25 MG tablet Take 25 mg by mouth 2 (two) times daily.   . clonazePAM (KLONOPIN) 2 MG tablet Take 2 mg by mouth 2 (two) times daily.  . cloNIDine (CATAPRES) 0.1 MG tablet Take 0.1 mg by mouth daily.   . furosemide (LASIX) 20 MG tablet Take 20 mg by mouth 2 (two) times daily.   . hydrOXYzine (ATARAX/VISTARIL) 25 MG tablet Take 25 mg by  mouth 2 (two) times daily as needed.   [name] lisinopril (PRINIVIL,ZESTRIL) 40 MG tablet Take 40 mg by mouth daily.  [name] LORazepam (ATIVAN) 1 MG tablet Take 1 mg by mouth 2 (two) times daily.  [name] PARoxetine (PAXIL) 40 MG tablet Take 40 mg by mouth 2 (two) times daily.  . pravastatin (PRAVACHOL) 10 MG tablet Take 10 mg by mouth daily.  . pseudoephedrine (SUDAFED) 30 MG tablet Take 30 mg by mouth 2 (two) times daily as needed.   . [DISCONTINUED] amLODipine (NORVASC) 10 MG tablet Take 10 mg by mouth daily.      Allergies:   Beef-derived products; Clonazepam; Monosodium glutamate; and Pegademase bovine   Social History   Social History  . Marital status: Married    Spouse name: N/A  . Number of children: N/A  . Years of education: N/A   Social History Main Topics  .  Smoking status: Former Smoker    Years: 20.00    Types: Cigarettes  . Smokeless tobacco: Never Used     Comment: quit 37 years ago  . Alcohol use No  . Drug use: No  . Sexual activity: Not Asked   Other Topics Concern  . None   Social History Narrative  . None     Family History: The patient's family history includes Leukemia in his sister; Lung disease in his father; Stroke in his mother. ROS:   Please see the history of present illness.    All other systems reviewed and are negative.  EKGs/Labs/Other Studies Reviewed:    The following studies were reviewed today:  EKG:  EKG ordered today.  The ekg ordered today demonstrates sinus rhythm normal  Recent Labs: 09/18/2016: ALT 22 10/06/2016: BUN 15; Creatinine 1.0; Hemoglobin 13.0; Platelets 309; Potassium 4.2; Sodium 142  Recent Lipid Panel No results found for: CHOL, TRIG, HDL, CHOLHDL, VLDL, LDLCALC, LDLDIRECT  Physical Exam:    VS:  BP 112/68 (BP Location: Right Arm, Patient Position: Sitting)   Pulse 64   Ht 5\' 10"  (1.778 m)   Wt 193 lb (87.5 kg)   SpO2 95%   BMI 27.69 kg/m     Wt Readings from Last 3 Encounters:  08/11/17 193 lb (87.5 kg)  10/07/16 190 lb 1.6 oz (86.2 kg)  10/06/16 190 lb 1.6 oz (86.2 kg)     GEN:  Well nourished, well developed in no acute distress HEENT: Normal NECK: No JVD; No carotid bruits LYMPHATICS: No lymphadenopathy CARDIAC: RRR, no murmurs, rubs, gallops RESPIRATORY:  Clear to auscultation without rales, wheezing or rhonchi  ABDOMEN: Soft, non-tender, non-distended MUSCULOSKELETAL:  No edema; No deformity  SKIN: Warm and dry NEUROLOGIC:  Alert and oriented x 3 PSYCHIATRIC:  Normal affect    Signed, Shirlee More, MD  08/11/2017 2:53 PM    [place]

## 2017-08-11 NOTE — Patient Instructions (Addendum)
Medication Instructions:  Your physician has recommended you make the following change in your medication:  STOP amlodipine START hydralazine 12.5 mg (0.5 tablet) twice daily   Labwork: None  Testing/Procedures: You had an EKG today.  Follow-Up: Your physician recommends that you schedule a follow-up appointment in: 3 weeks.   Any Other Special Instructions Will Be Listed Below (If Applicable).     If you need a refill on your cardiac medications before your next appointment, please call your pharmacy.

## 2017-09-01 ENCOUNTER — Ambulatory Visit: Payer: Medicare Other | Admitting: Cardiology

## 2017-09-20 DIAGNOSIS — Z23 Encounter for immunization: Secondary | ICD-10-CM | POA: Diagnosis not present

## 2017-10-14 DIAGNOSIS — Z471 Aftercare following joint replacement surgery: Secondary | ICD-10-CM | POA: Diagnosis not present

## 2017-10-14 DIAGNOSIS — Z96652 Presence of left artificial knee joint: Secondary | ICD-10-CM | POA: Diagnosis not present

## 2017-11-05 DIAGNOSIS — L509 Urticaria, unspecified: Secondary | ICD-10-CM | POA: Diagnosis not present

## 2017-11-11 DIAGNOSIS — R55 Syncope and collapse: Secondary | ICD-10-CM | POA: Diagnosis not present

## 2017-11-11 DIAGNOSIS — I10 Essential (primary) hypertension: Secondary | ICD-10-CM | POA: Diagnosis not present

## 2017-11-11 DIAGNOSIS — R413 Other amnesia: Secondary | ICD-10-CM | POA: Diagnosis not present

## 2017-11-11 DIAGNOSIS — R42 Dizziness and giddiness: Secondary | ICD-10-CM | POA: Diagnosis not present

## 2017-11-12 DIAGNOSIS — R410 Disorientation, unspecified: Secondary | ICD-10-CM | POA: Diagnosis not present

## 2017-11-12 DIAGNOSIS — R4781 Slurred speech: Secondary | ICD-10-CM | POA: Diagnosis not present

## 2017-11-12 DIAGNOSIS — R55 Syncope and collapse: Secondary | ICD-10-CM | POA: Diagnosis not present

## 2017-11-12 DIAGNOSIS — R413 Other amnesia: Secondary | ICD-10-CM | POA: Diagnosis not present

## 2017-12-01 ENCOUNTER — Ambulatory Visit (INDEPENDENT_AMBULATORY_CARE_PROVIDER_SITE_OTHER): Payer: Medicare Other | Admitting: Neurology

## 2017-12-01 ENCOUNTER — Encounter: Payer: Self-pay | Admitting: Neurology

## 2017-12-01 VITALS — Ht 70.0 in | Wt 184.2 lb

## 2017-12-01 DIAGNOSIS — E538 Deficiency of other specified B group vitamins: Secondary | ICD-10-CM | POA: Diagnosis not present

## 2017-12-01 DIAGNOSIS — R269 Unspecified abnormalities of gait and mobility: Secondary | ICD-10-CM | POA: Diagnosis not present

## 2017-12-01 DIAGNOSIS — R2689 Other abnormalities of gait and mobility: Secondary | ICD-10-CM

## 2017-12-01 DIAGNOSIS — R413 Other amnesia: Secondary | ICD-10-CM | POA: Diagnosis not present

## 2017-12-01 DIAGNOSIS — Z9114 Patient's other noncompliance with medication regimen: Secondary | ICD-10-CM

## 2017-12-01 DIAGNOSIS — R55 Syncope and collapse: Secondary | ICD-10-CM | POA: Diagnosis not present

## 2017-12-01 DIAGNOSIS — I951 Orthostatic hypotension: Secondary | ICD-10-CM

## 2017-12-01 DIAGNOSIS — R419 Unspecified symptoms and signs involving cognitive functions and awareness: Secondary | ICD-10-CM | POA: Diagnosis not present

## 2017-12-01 DIAGNOSIS — R7309 Other abnormal glucose: Secondary | ICD-10-CM

## 2017-12-01 NOTE — Progress Notes (Addendum)
[facility]    Provider:  Dr Jaynee Eagles Referring Provider: Rochel Brome, MD Primary Care Physician:  Rochel Brome, MD  CC:  Syncope and collapse  HPI:  Marcus King is a 76 y.o. male here as a referral from Dr. Tobie Poet for syncope and collapse. PMHx hypertensive left vent hypertrophy, hld, anxiety, OSA on cpap. Patient had a recent syncopal episode in the setting of a systolic of 83. Today he is orthostatic in the office. He ha had some labile BPs for 40 years. He had a knee replacement in 2017 and around 5pm would sundown become very confused but that resolved after the surgery. He has memory problems. He forgets names, in the last 12 months feel memory progressively worsening. Here with wife of 62 years. He had worsened dizziness since hitting his head. He doesn't exercise regularly since the age of 11.   Syncopal episode: He was dehydrated and he passed out the first week of November. Has happened multiple times in the past. In the setting of low blood pressures. He fell and had a LOC and hit his head. He has had multiple episodes of orthostatic syncope. They have changed his medication in the past. He also had some side effects in the past to medication including Clonazepam. Discussed orthostatic hypotension.  The night afterwards they went to dinner, he was very unstable, he was confused, walking with one foot in front of the right. He insisted on having dinner and they helped him to a table. When he got up to leave he didn't notice his shoe was not on.  She had to help him undress at home. He did not remember anything from the dinner, going there or eating, wife says at the time he was answering appropriately, knew his friends. He doesn't remember anything. No hx of seizure-like events, other episodes of confusion (except the sundowing after surgery). When his blood pressure is low he can act confused. Wife did not check his BP that night however. He admits to not drinking water or  wearing compression stockings. His urine is dark. Dizzy on standing.   Memory loss: Started about a year ago after the surgery. Started with more name loss. He forgets conversations sometimes but not often, he went to a play and couldn't remember the name of it the next day (the nutcracker). He has some difficulty remembering things in the newspaper. Driving and directions are fine. No accidents in the home, no falls, no swallowing difficulties. He pays company bills, not missing any or double paying. He takes his medications reliably, he monitors blood pressure. He is still social, still going out with friends. He has anxiety but no depression. He has sleep apnea and is compliant with the cpap.  He feels well rested and not snoring through the cpap. Does not repeat things int he same day.No hallucinations or delusions. No problems with date and year. No confusion with traveling. No problems with money or tipping. No trouble finding words. He may forget where he dines last week. He loses things in the house. He is still working. No hx of demenita in the family but parents died at 39 (father) and mother dies of a stroke at 54.     Reviewed notes, labs and imaging from outside physicians, which showed:  Reviewed primary care notes.  Patient had an episode of syncope approximately first week of November.  He lost his balance and fell backwards, slurred speech, hit his head, had loss of conscious for 3 minutes  and was disoriented.  Refused to go to the emergency department.  1 week prior to that he got out of the car and started walking funny.  He went to dinner but did not remember the entire dinner.  He tried to leave the restaurant was missing issue under the table and did not realize it.  He has not been diagnosed with any form of amnesia or dementia, mental status appears to be gradually deteriorating over the last 12 months, but he has gotten confused with directions in the past, overall appearance is  clean, behavior is described as anxious, no new drugs recently.  He had previously been evaluated for altered mental status and that was at that time attributed to sundowning which occurred 1 year ago when hospitalized.  Review of Systems: Patient complains of symptoms per HPI as well as the following symptoms: memory loss, dizziness, passing out, anxiety, decreased energy, itching. Pertinent negatives and positives per HPI. All others negative.   Social History   Socioeconomic History  . Marital status: Married    Spouse name: Not on file  . Number of children: 2  . Years of education: Not on file  . Highest education level: High school graduate  Social Needs  . Financial resource strain: Not on file  . Food insecurity - worry: Not on file  . Food insecurity - inability: Not on file  . Transportation needs - medical: Not on file  . Transportation needs - non-medical: Not on file  Occupational History  . Not on file  Tobacco Use  . Smoking status: Former Smoker    Years: 20.00    Types: Cigarettes    Last attempt to quit: 1979    Years since quitting: 39.9  . Smokeless tobacco: Never Used  . Tobacco comment: quit 37 years ago  Substance and Sexual Activity  . Alcohol use: No  . Drug use: No  . Sexual activity: Not on file  Other Topics Concern  . Not on file  Social History Narrative   LIves at home with his wife   Self employed   Right handed   Drinks about 2 cups of caffeine daily    Family History  Problem Relation Age of Onset  . Stroke Mother   . Lung disease Father   . Stroke Father   . Leukemia Sister   . Alzheimer's disease Neg Hx     Past Medical History:  Diagnosis Date  . Anxiety   . Chronic pain of left knee 08/11/2016  . Food poisoning   . Headache    sinus  . Hypertension   . OSA on CPAP   . Prostate cancer ([place])   . 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 Sleep apnea 08/05/2017    Past Surgical History:    Procedure Laterality Date  . CARDIAC CATHETERIZATION    . CATARACT EXTRACTION W/ INTRAOCULAR LENS  IMPLANT, BILATERAL Bilateral   . COLON SURGERY  2014  . COLOSTOMY REVERSAL  2014  . HERNIA REPAIR     x2  . JOINT REPLACEMENT    . PROSTATE SURGERY  2006  . SEPTOPLASTY    . TOTAL KNEE ARTHROPLASTY Left 09/28/2016   Procedure: TOTAL KNEE ARTHROPLASTY;  Surgeon: [name], MD;  Location: [facility];  Service: Orthopedics;  Laterality: Left;    Current Outpatient Medications  Medication Sig Dispense Refill  . busPIRone (BUSPAR) 10 MG tablet Take 10 mg by mouth 2 (two) times daily.    [name]  carvedilol (COREG) 25 MG tablet Take 25 mg by mouth 2 (two) times daily.     . cloNIDine (CATAPRES) 0.1 MG tablet Take 0.1 mg by mouth daily.     . furosemide (LASIX) 20 MG tablet Take 20 mg by mouth 2 (two) times daily.     . hydrOXYzine (ATARAX/VISTARIL) 25 MG tablet Take 25 mg by mouth 2 (two) times daily as needed.    [name] lisinopril (PRINIVIL,ZESTRIL) 40 MG tablet Take 40 mg by mouth daily.    [name] LORazepam (ATIVAN) 1 MG tablet Take 1 mg by mouth 2 (two) times daily as needed.     [name] PARoxetine (PAXIL) 40 MG tablet Take 40 mg by mouth 2 (two) times daily.    . pravastatin (PRAVACHOL) 10 MG tablet Take 10 mg by mouth daily.    . pseudoephedrine (SUDAFED) 30 MG tablet Take 30 mg by mouth 2 (two) times daily as needed.     [name] aspirin EC 81 MG tablet Take 1 tablet (81 mg total) by mouth daily. 30 tablet 11  . hydrALAZINE (APRESOLINE) 25 MG tablet Take 0.5 tablets (12.5 mg total) by mouth 2 (two) times daily. 60 tablet 3   No current facility-administered medications for this visit.     Allergies as of 12/01/2017 - Review Complete 12/01/2017  Allergen Reaction Noted  . Beef-derived products Diarrhea 09/15/2016  . Clonazepam Other (See Comments) 09/15/2016  . Monosodium glutamate Other (See Comments) 09/25/2016  . Pegademase bovine Diarrhea 09/15/2016    Vitals: Ht 5\' 10"  (1.778 m)   Wt 184 lb 3.2 oz (83.6  kg)   BMI 26.43 kg/m  Last Weight:  Wt Readings from Last 1 Encounters:  12/01/17 184 lb 3.2 oz (83.6 kg)   Last Height:   Ht Readings from Last 1 Encounters:  12/01/17 5\' 10"  (1.778 m)    Physical exam: Exam: Gen: NAD, conversant, well nourised, well groomed                     CV: RRR, no MRG. No Carotid Bruits. No peripheral edema, warm, nontender Eyes: Conjunctivae clear without exudates or hemorrhage  Neuro: Detailed Neurologic Exam  Speech:    Speech is normal; fluent and spontaneous with normal comprehension.  Cognition:    The patient is oriented to person, place, and time;   MMSE - Mini Mental State Exam 12/01/2017  Orientation to time 5  Orientation to Place 5  Registration 3  Attention/ Calculation 5  Recall 1  Language- name 2 objects 2  Language- repeat 1  Language- follow 3 step command 3  Language- read & follow direction 1  Write a sentence 1  Copy design 1  Total score 28       recent and remote memory impaired ;     language fluent;     normal attention, concentration,  fund of knowledge Cranial Nerves:    The pupils are equal, round, and reactive to light. Attempted fundoscopic exam could not visualize. Visual fields are full to finger confrontation. Extraocular movements are intact. Trigeminal sensation is intact and the muscles of mastication are normal. The face is symmetric. The palate elevates in the midline. Hearing intact. Voice is normal. Shoulder shrug is normal. The tongue has normal motion without fasciculations.   Coordination:    Normal finger to nose and heel to shin. Normal rapid alternating movements.   Gait:    Heel-toe normal, tandem with difficulty  Motor Observation:    No asymmetry,  no atrophy, and no involuntary movements noted. Tone:    Normal muscle tone.    Posture:    Posture is normal. normal erect    Strength:    Strength is V/V in the upper and lower limbs.      Sensation:  +Romberg, Dec pin prick and  proprioception distally lower extremities      Reflex Exam:  DTR's:    Deep tendon reflexes in the upper and lower extremities are symmetrical bilaterally.   Toes:    The toes are downgoing bilaterally.   Clonus:    Clonus is absent.    Assessment/Plan:  76 y.o. male here as a referral from Dr. Tobie Poet for syncope and collapse. PMHx hypertensive left vent hypertrophy, labile blood pressure and orthostatic hypotension, hld, anxiety, OSA on cpap  - Patient orthostatic today systolic 371 laying to systolic 91 sitting. Likely cause of syncopal episodes. - Orthostasis and syncope: not drinking fluids, needs compression stockings, discussed non-pharmacologic strategies. Also appears he may not be taking his medications correctly due to cognitive decline, will request nursing to the home for medication evaluation and management - EEG: Episode of confusion in the setting of benzo use, most likely medication related and low likelihood of complex seizure but need to evaluate - PT for abnormal gait and imbalance, orthostatic hypotension, (will ask PT to see if they can custom measure for compression stockings?). Wife will accompany him to a [facility] outpatient facility - Formal neurocognitive testing for memory loss likely MCI or early Alzheimer's disease - Anxiety: recommend geriatric psychiatrist and therapy: Dr. Norma Fredrickson or PA Eino Farber at [facility], discussed with daughter OTP after appointment - They brought up Holter monitor: will defer to cardiology, talk to pcp - Unlikely TIA but can take daily ASA for prevention and follow with pcp for vascular risk factors - Neuropathy: check B12, tsh, hgba1c - MRI of the brain - need records, requested report and CD - FDG PET scan: consider after above workup  Orders Placed This Encounter  Procedures  . B12 and Folate Panel  . Methylmalonic acid, serum  . Hemoglobin A1c  . Ambulatory referral to Home Health  . Ambulatory referral to  Neuropsychology  . Ambulatory referral to Physical Therapy  . EEG    Cc: Dr. Tobie Poet  Daughter: Marcus King [phone] [place] [address] [place], [facility] [address] [place], [place] [address]  Phone [phone] Fax [phone]

## 2017-12-01 NOTE — Patient Instructions (Signed)
Review MRI brain EEG PT for gait and imbalance Formal neurocognitive testing: Dr. Si Raider Will defer to Cardiology for Holter Monitor Start ASA 81mg  for stroke prevention Check B12 for neuropathy in the feet   Non-Drug Treatment for Low Blood Pressure on Standing:  1. Changing Postures: . Change posture slowly when getting up, especially in the morning . Hold on to something during the first few minutes after standing up. Do not start walking as soon as you get up from the chair . Avoid prolonged recumbency or lying down . Raise the head of the bed by 10 to 20 degrees  2. Exercise: . Perform Isotonic exercise, e.g.recumbent bike, pedaling movements while sitting in a chair . Avoid exercises where you have to strain   3. Avoid Pooling of blood in legs: . Wear custom-fitted elastic stockings. The ones which extend to the abdomen work even better. Consider wearing an abdominal binder. . Perform physical counter-maneuvers, such as crossing legs and tensing leg muscles.  4. Eating and Drinking: . Small meals are recommended. Avoid large meals. Avoid standing suddenly after a large meal . Avoid alcohol . Increase intake of fluids and regular salt. A daily intake of up to 10 grams of sodium per day and a fluid intake of 2.0 to 2.5 liters per day (8 to 10 glasses of water) is recommended.  . Rapid (over 3 minutes) ingestion of approximately 0.5 liter (2 glasses of water) of tap water, raises blood pressure within 5 to 15 minutes and lasts for an hour.  5. Other Tips: . Avoid hot baths. Instead take warm baths . Maintain a BP record standing and lying down . If you are only any BP lowering drugs (antihypertensives, diuretics, antidepressants, drugs for prostate, etc) ask your doctor to revisit the need to keep you on these drugs . If all non-drug therapy fails, ask your doctor about drug therapy

## 2017-12-02 ENCOUNTER — Encounter: Payer: Self-pay | Admitting: Neurology

## 2017-12-02 MED ORDER — ASPIRIN EC 81 MG PO TBEC: 81.0000 mg | DELAYED_RELEASE_TABLET | Freq: Every day | ORAL | 11 refills | Status: DC

## 2017-12-05 LAB — METHYLMALONIC ACID, SERUM: METHYLMALONIC ACID: 206 nmol/L (ref 0–378)

## 2017-12-05 LAB — HEMOGLOBIN A1C
Est. average glucose Bld gHb Est-mCnc: 114 mg/dL
Hgb A1c MFr Bld: 5.6 % (ref 4.8–5.6)

## 2017-12-05 LAB — B12 AND FOLATE PANEL
FOLATE: 13.9 ng/mL (ref >3.0)
VITAMIN B 12: 257 pg/mL (ref 232–1245)

## 2017-12-06 ENCOUNTER — Telehealth: Payer: Self-pay | Admitting: Cardiology

## 2017-12-06 NOTE — Telephone Encounter (Signed)
I'm fine with that 

## 2017-12-06 NOTE — Telephone Encounter (Signed)
New Message  Pt call requesting to switch providers from Dr. Bettina Gavia to Dr. Radford Pax. Would it be okay to make the switch?

## 2017-12-07 NOTE — Telephone Encounter (Signed)
Yes

## 2017-12-08 ENCOUNTER — Telehealth: Payer: Self-pay

## 2017-12-08 ENCOUNTER — Encounter: Payer: Self-pay | Admitting: Psychology

## 2017-12-08 NOTE — Telephone Encounter (Signed)
I called pt to discuss. No answer, left a message asking him to call me back. 

## 2017-12-08 NOTE — Telephone Encounter (Signed)
Spoke with patient. Patient states that he is having a hard time controlling BP. Patient was advised by Dr. Jaynee Eagles, neurologist to switch cardiologist. Patient has a new patient appt with Dr. Radford Pax on 01/14/18. Patient verbalized understanding and thanked me for the call.

## 2017-12-15 DIAGNOSIS — R2689 Other abnormalities of gait and mobility: Secondary | ICD-10-CM | POA: Diagnosis not present

## 2017-12-15 DIAGNOSIS — R269 Unspecified abnormalities of gait and mobility: Secondary | ICD-10-CM | POA: Diagnosis not present

## 2017-12-16 ENCOUNTER — Ambulatory Visit (INDEPENDENT_AMBULATORY_CARE_PROVIDER_SITE_OTHER): Payer: Medicare Other | Admitting: Neurology

## 2017-12-16 DIAGNOSIS — R7309 Other abnormal glucose: Secondary | ICD-10-CM

## 2017-12-16 DIAGNOSIS — R413 Other amnesia: Secondary | ICD-10-CM

## 2017-12-16 DIAGNOSIS — E538 Deficiency of other specified B group vitamins: Secondary | ICD-10-CM

## 2017-12-16 DIAGNOSIS — R419 Unspecified symptoms and signs involving cognitive functions and awareness: Secondary | ICD-10-CM

## 2017-12-16 DIAGNOSIS — I951 Orthostatic hypotension: Secondary | ICD-10-CM

## 2017-12-16 DIAGNOSIS — R55 Syncope and collapse: Secondary | ICD-10-CM

## 2017-12-16 NOTE — Telephone Encounter (Signed)
I called pt again to discuss his lab results. No answer, left a message asking him to call me back.

## 2017-12-17 ENCOUNTER — Telehealth: Payer: Self-pay | Admitting: *Deleted

## 2017-12-17 DIAGNOSIS — R269 Unspecified abnormalities of gait and mobility: Secondary | ICD-10-CM | POA: Diagnosis not present

## 2017-12-17 DIAGNOSIS — R2689 Other abnormalities of gait and mobility: Secondary | ICD-10-CM | POA: Diagnosis not present

## 2017-12-17 NOTE — Procedures (Signed)
   HISTORY: 77 years old male presented with passing out episode.  TECHNIQUE:  16 channel EEG was performed based on standard 10-16 international system. One channel was dedicated to EKG, which has demonstrates normal sinus rhythm of 72 beats per minutes.  Upon awakening, the posterior background activity was well-developed, in alpha range, 10 Hz, reactive to eye opening and closure.  There was no evidence of epileptiform discharge.  Photic stimulation was performed, which induced a symmetric photic driving.  Hyperventilation was performed, there was no abnormality elicit.  No sleep was achieved.  CONCLUSION: This is a  normal awake EEG.  There is no electrodiagnostic evidence of epileptiform discharge.  Marcial Pacas, M.D. Ph.D.  [facility] [place], [place] [address] Phone: [phone] Fax:      [phone]

## 2017-12-17 NOTE — Telephone Encounter (Signed)
Faxed signed PT Plan of Care forms to [facility]. Received a receipt of confirmation.

## 2017-12-20 DIAGNOSIS — R269 Unspecified abnormalities of gait and mobility: Secondary | ICD-10-CM | POA: Diagnosis not present

## 2017-12-20 DIAGNOSIS — R2689 Other abnormalities of gait and mobility: Secondary | ICD-10-CM | POA: Diagnosis not present

## 2017-12-20 NOTE — Telephone Encounter (Signed)
I called pt, advised him that his vitamin b12 is borderline low, Dr. Jaynee Eagles recommends vitamin b12 1000-2000 mcg oral daily and a vitamin b12 recheck at his next visit. Pt verbalized understanding of results. Pt had no questions at this time but was encouraged to call back if questions arise.

## 2017-12-21 ENCOUNTER — Telehealth: Payer: Self-pay | Admitting: *Deleted

## 2017-12-21 NOTE — Telephone Encounter (Signed)
Called and LVM (ok per DPR) informing patient that his EEG is normal. This message does not require a call back but if he has any questions I encouraged him to call the office; opens tomorrow morning 8 AM.

## 2017-12-21 NOTE — Telephone Encounter (Signed)
-----   Message from Melvenia Beam, MD sent at 12/18/2017 11:40 AM EST ----- EEG is normal thanks

## 2017-12-23 DIAGNOSIS — R269 Unspecified abnormalities of gait and mobility: Secondary | ICD-10-CM | POA: Diagnosis not present

## 2017-12-23 DIAGNOSIS — R2689 Other abnormalities of gait and mobility: Secondary | ICD-10-CM | POA: Diagnosis not present

## 2017-12-27 DIAGNOSIS — R269 Unspecified abnormalities of gait and mobility: Secondary | ICD-10-CM | POA: Diagnosis not present

## 2017-12-27 DIAGNOSIS — R2689 Other abnormalities of gait and mobility: Secondary | ICD-10-CM | POA: Diagnosis not present

## 2017-12-30 DIAGNOSIS — R2689 Other abnormalities of gait and mobility: Secondary | ICD-10-CM | POA: Diagnosis not present

## 2017-12-30 DIAGNOSIS — R269 Unspecified abnormalities of gait and mobility: Secondary | ICD-10-CM | POA: Diagnosis not present

## 2018-01-03 DIAGNOSIS — I110 Hypertensive heart disease with heart failure: Secondary | ICD-10-CM | POA: Diagnosis not present

## 2018-01-03 DIAGNOSIS — F411 Generalized anxiety disorder: Secondary | ICD-10-CM | POA: Diagnosis not present

## 2018-01-03 DIAGNOSIS — I10 Essential (primary) hypertension: Secondary | ICD-10-CM | POA: Diagnosis not present

## 2018-01-04 DIAGNOSIS — R2689 Other abnormalities of gait and mobility: Secondary | ICD-10-CM | POA: Diagnosis not present

## 2018-01-04 DIAGNOSIS — R269 Unspecified abnormalities of gait and mobility: Secondary | ICD-10-CM | POA: Diagnosis not present

## 2018-01-06 DIAGNOSIS — R2689 Other abnormalities of gait and mobility: Secondary | ICD-10-CM | POA: Diagnosis not present

## 2018-01-06 DIAGNOSIS — R269 Unspecified abnormalities of gait and mobility: Secondary | ICD-10-CM | POA: Diagnosis not present

## 2018-01-10 DIAGNOSIS — R2689 Other abnormalities of gait and mobility: Secondary | ICD-10-CM | POA: Diagnosis not present

## 2018-01-10 DIAGNOSIS — R269 Unspecified abnormalities of gait and mobility: Secondary | ICD-10-CM | POA: Diagnosis not present

## 2018-01-13 DIAGNOSIS — R269 Unspecified abnormalities of gait and mobility: Secondary | ICD-10-CM | POA: Diagnosis not present

## 2018-01-13 DIAGNOSIS — R2689 Other abnormalities of gait and mobility: Secondary | ICD-10-CM | POA: Diagnosis not present

## 2018-01-14 ENCOUNTER — Ambulatory Visit (INDEPENDENT_AMBULATORY_CARE_PROVIDER_SITE_OTHER): Payer: Medicare Other | Admitting: Cardiology

## 2018-01-14 ENCOUNTER — Ambulatory Visit: Payer: Medicare Other | Admitting: Cardiology

## 2018-01-14 ENCOUNTER — Encounter: Payer: Self-pay | Admitting: Cardiology

## 2018-01-14 ENCOUNTER — Telehealth: Payer: Self-pay | Admitting: *Deleted

## 2018-01-14 VITALS — BP 117/70 | HR 69 | Ht 70.0 in | Wt 186.6 lb

## 2018-01-14 DIAGNOSIS — G4733 Obstructive sleep apnea (adult) (pediatric): Secondary | ICD-10-CM | POA: Diagnosis not present

## 2018-01-14 DIAGNOSIS — Z9989 Dependence on other enabling machines and devices: Secondary | ICD-10-CM

## 2018-01-14 DIAGNOSIS — E785 Hyperlipidemia, unspecified: Secondary | ICD-10-CM | POA: Diagnosis not present

## 2018-01-14 DIAGNOSIS — I110 Hypertensive heart disease with heart failure: Secondary | ICD-10-CM

## 2018-01-14 DIAGNOSIS — I10 Essential (primary) hypertension: Secondary | ICD-10-CM

## 2018-01-14 DIAGNOSIS — I951 Orthostatic hypotension: Secondary | ICD-10-CM | POA: Insufficient documentation

## 2018-01-14 NOTE — Telephone Encounter (Signed)
-----   Message from [place], RN sent at 01/14/2018  2:21 PM EST ----- Regarding: request for D/L Per Dr. Radford Pax get a D/L from DME from [facility] in [place], [place]. Contact number is [phone]  Thanks Mearl Latin

## 2018-01-14 NOTE — Progress Notes (Addendum)
Cardiology Office Note:    Date:  01/14/2018   ID:  Marcus King, DOB Jun 05, 1941, MRN [id number]  PCP:  Rochel Brome, MD  Cardiologist:  No primary care provider on file.    Referring MD: Rochel Brome, MD   Chief Complaint  Patient presents with  . Hypertension    History of Present Illness:    Marcus King is a 77 y.o. male with a hx of HTN, anxiety and OSA on CPAP who is here to establish cardiac care.  He has been seeing Dr. Bettina Gavia for treatment of hypertensive heart disease and has recently requested to change providers.  he is here today for followup and is doing well.  He denies any chest pain or pressure, SOB, DOE, PND, palpitations or orthopnea.  He occasionally has some mild LE edema.   He is compliant with his meds and is tolerating meds with no SE.    Past Medical History:  Diagnosis Date  . Anxiety   . Chronic pain of left knee 08/11/2016  . Food poisoning   . Headache    sinus  . Hypertension   . OSA on CPAP   . Prostate cancer ([place])   . 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 CARDIAC CATHETERIZATION    . CATARACT EXTRACTION W/ INTRAOCULAR LENS  IMPLANT, BILATERAL Bilateral   . COLON SURGERY  2014  . COLOSTOMY REVERSAL  2014  . HERNIA REPAIR     x2  . JOINT REPLACEMENT    . PROSTATE SURGERY  2006  . SEPTOPLASTY    . TOTAL KNEE ARTHROPLASTY Left 09/28/2016   Procedure: TOTAL KNEE ARTHROPLASTY;  Surgeon: [name], MD;  Location: [facility];  Service: Orthopedics;  Laterality: Left;    Current Medications: Current Meds  Medication Sig  . amLODipine (NORVASC) 10 MG tablet Take 10 mg by mouth 2 (two) times daily.  [name] aspirin EC 81 MG tablet Take 1 tablet (81 mg total) by mouth daily.  . busPIRone (BUSPAR) 10 MG tablet Take 10 mg by mouth 2 (two) times daily.  . carvedilol (COREG) 25 MG tablet Take 25 mg by mouth 2 (two) times daily.   . furosemide (LASIX) 20 MG tablet Take 20 mg by  mouth 2 (two) times daily.   [name] lisinopril (PRINIVIL,ZESTRIL) 40 MG tablet Take 40 mg by mouth 2 (two) times daily.  [name] LORazepam (ATIVAN) 1 MG tablet Take 1 mg by mouth 2 (two) times daily as needed.   [name] PARoxetine (PAXIL) 40 MG tablet Take 40 mg by mouth 2 (two) times daily.  . pravastatin (PRAVACHOL) 10 MG tablet Take 10 mg by mouth daily.  . [DISCONTINUED] cloNIDine (CATAPRES) 0.1 MG tablet Take 0.1 mg by mouth daily.      Allergies:   Beef-derived products; Clonazepam; Monosodium glutamate; and Pegademase bovine   Social History   Socioeconomic History  . Marital status: Married    Spouse name: None  . Number of children: 2  . Years of education: None  . Highest education level: High school graduate  Social Needs  . Financial resource strain: None  . Food insecurity - worry: None  . Food insecurity - inability: None  . Transportation needs - medical: None  . Transportation needs - non-medical: None  Occupational History  . None  Tobacco Use  . Smoking status: Former Smoker    Years: 20.00    Types: Cigarettes  Last attempt to quit: 1979    Years since quitting: 40.1  . Smokeless tobacco: Never Used  . Tobacco comment: quit 37 years ago  Substance and Sexual Activity  . Alcohol use: No  . Drug use: No  . Sexual activity: None  Other Topics Concern  . None  Social History Narrative   LIves at home with his wife   Self employed   Right handed   Drinks about 2 cups of caffeine daily     Family History: The patient's family history includes Leukemia in his sister; Lung disease in his father; Stroke in his father and mother. There is no history of Alzheimer's disease.  ROS:   Please see the history of present illness.    ROS  All other systems reviewed and negative.   EKGs/Labs/Other Studies Reviewed:    The following studies were reviewed today: none  EKG:  EKG is not ordered today.    Recent Labs: No results found for requested labs within last 8760  hours.   Recent Lipid Panel No results found for: CHOL, TRIG, HDL, CHOLHDL, VLDL, LDLCALC, LDLDIRECT  Physical Exam:    VS:  BP 117/70   Pulse 69   Ht 5\' 10"  (1.778 m)   Wt 186 lb 9.6 oz (84.6 kg)   BMI 26.77 kg/m     Wt Readings from Last 3 Encounters:  01/14/18 186 lb 9.6 oz (84.6 kg)  12/01/17 184 lb 3.2 oz (83.6 kg)  08/11/17 193 lb (87.5 kg)     GEN:  Well nourished, well developed in no acute distress HEENT: Normal NECK: No JVD; No carotid bruits LYMPHATICS: No lymphadenopathy CARDIAC: RRR, no murmurs, rubs, gallops RESPIRATORY:  Clear to auscultation without rales, wheezing or rhonchi  ABDOMEN: Soft, non-tender, non-distended MUSCULOSKELETAL:  No edema; No deformity  SKIN: Warm and dry NEUROLOGIC:  Alert and oriented x 3 PSYCHIATRIC:  Normal affect   ASSESSMENT:    1. Hypertensive heart disease with congestive heart failure, unspecified heart failure type ([place])   2. OSA on CPAP   3. Dyslipidemia   4. Orthostatic hypertension    PLAN:    In order of problems listed above:  1.  Hypertensive heart disease - BP is well controlled on exam today.  He will continue on amlodipine 10mg  daily, Carvedilol 25mg  BID,  Lisinopril 40mg  daily.  Creatinine is stable at 1.1 on 01/03/2018  2.  OSA on CPAP - I encouraged him to continue to use his CPAP as much as possible which will help with BP control.    3.  Hyperlipidemia - followed by his PCP.  His LDL was 88 in July 2018.  4.  Syncope with orthostasis.  He has had several episodes of presyncope and his neurologist recommended increasing fluid intake as his syncope was felt to be due to dehydration.  He has not had anymore episodes since increasing fluids but he is orthostatic on exam today with his BP dropping from 122/77 to 95/65mmHg after 3 minutes standing.  He brought in his BP readings today which, for the most part are fairly well controlled ranging from 355-732KGUR systolic but occasional readings in the 90's.  I have  recommended that he continue to stay well hydrated.  I am going to add compression hose on during the day.  If he continues to have episodes of dizziness or syncope may need to consider stopping BB which could be resulting in chronotropic incompetence.  Another option would be to add Proamatine but  I would like to hold off given his labile HTN.  He will followup with my PA in 4 weeks and me in 3 months.    Medication Adjustments/Labs and Tests Ordered: Current medicines are reviewed at length with the patient today.  Concerns regarding medicines are outlined above.  No orders of the defined types were placed in this encounter.  No orders of the defined types were placed in this encounter.   Signed, Fransico Him, MD  01/14/2018 3:01 PM    [place]

## 2018-01-14 NOTE — Patient Instructions (Addendum)
Medication Instructions:  Your physician recommends that you continue on your current medications as directed. Please refer to the Current Medication list given to you today.  If you need a refill on your cardiac medications, please contact your pharmacy first.  Labwork: None ordered   Testing/Procedures: None ordered   Follow-Up: Your physician recommends that you schedule a follow-up appointment in: 4 week with a PA  Your physician recommends that you schedule a follow-up appointment in: 3 months with Dr. Radford Pax.    Any Other Special Instructions Will Be Listed Below (If Applicable).  Take your chip to [facility] in [place], [place]. Fax download to Gae Bon, CPAP Assistant at [phone].   Your physician recommends your wear compression socks during the day.  How to Use Compression Stockings Compression stockings are elastic socks that squeeze the legs. They help to increase blood flow to the legs, decrease swelling in the legs, and reduce the chance of developing blood clots in the lower legs. Compression stockings are often used by people who:  Are recovering from surgery.  Have poor circulation in their legs.  Are prone to getting blood clots in their legs.  Have varicose veins.  Sit or stay in bed for long periods of time.  How to use compression stockings Before you put on your compression stockings:  Make sure that they are the correct size. If you do not know your size, ask your health care provider.  Make sure that they are clean, dry, and in good condition.  Check them for rips and tears. Do not put them on if they are ripped or torn.  Put your stockings on first thing in the morning, before you get out of bed. Keep them on for as long as your health care provider advises. When you are wearing your stockings:  Keep them as smooth as possible. Do not allow them to bunch up. It is especially important to prevent the stockings from bunching up around your  toes or behind your knees.  Do not roll the stockings downward and leave them rolled down. This can decrease blood flow to your leg.  Change them right away if they become wet or dirty.  When you take off your stockings, inspect your legs and feet. Anything that does not seem normal may require medical attention. Look for:  Open sores.  Red spots.  Swelling.  Information and tips  Do not stop wearing your compression stockings without talking to your health care provider first.  Wash your stockings every day with mild detergent in cold or warm water. Do not use bleach. Air-dry your stockings or dry them in a clothes dryer on low heat.  Replace your stockings every 3-6 months.  If skin moisturizing is part of your treatment plan, apply lotion or cream at night so that your skin will be dry when you put on the stockings in the morning. It is harder to put the stockings on when you have lotion on your legs or feet. Contact a health care provider if: Remove your stockings and seek medical care if:  You have a feeling of pins and needles in your feet or legs.  You have any new changes in your skin.  You have skin lesions that are getting worse.  You have swelling or pain that is getting worse.  Get help right away if:  You have numbness or tingling in your lower legs that does not get better right after you take the stockings off.  Your toes or feet become cold and blue.  You develop open sores or red spots on your legs that do not go away.  You see or feel a warm spot on your leg.  You have new swelling or soreness in your leg.  You are short of breath or you have chest pain for no reason.  You have a rapid or irregular heartbeat.  You feel light-headed or dizzy. This information is not intended to replace advice given to you by your health care provider. Make sure you discuss any questions you have with your health care provider. Document Released: 09/27/2009 Document  Revised: 04/29/2016 Document Reviewed: 11/07/2014 Elsevier Interactive Patient Education  [employer].     If you need a refill on your cardiac medications before your next appointment, please call your pharmacy.

## 2018-01-14 NOTE — Telephone Encounter (Signed)
D/L has been ordered

## 2018-01-17 ENCOUNTER — Encounter: Payer: Self-pay | Admitting: Cardiology

## 2018-01-17 DIAGNOSIS — R269 Unspecified abnormalities of gait and mobility: Secondary | ICD-10-CM | POA: Diagnosis not present

## 2018-01-17 DIAGNOSIS — R2689 Other abnormalities of gait and mobility: Secondary | ICD-10-CM | POA: Diagnosis not present

## 2018-01-20 DIAGNOSIS — R269 Unspecified abnormalities of gait and mobility: Secondary | ICD-10-CM | POA: Diagnosis not present

## 2018-01-20 DIAGNOSIS — R2689 Other abnormalities of gait and mobility: Secondary | ICD-10-CM | POA: Diagnosis not present

## 2018-01-24 DIAGNOSIS — E038 Other specified hypothyroidism: Secondary | ICD-10-CM | POA: Diagnosis not present

## 2018-01-24 DIAGNOSIS — R0981 Nasal congestion: Secondary | ICD-10-CM | POA: Diagnosis not present

## 2018-01-24 DIAGNOSIS — L501 Idiopathic urticaria: Secondary | ICD-10-CM | POA: Diagnosis not present

## 2018-01-24 DIAGNOSIS — R600 Localized edema: Secondary | ICD-10-CM | POA: Diagnosis not present

## 2018-01-24 DIAGNOSIS — F411 Generalized anxiety disorder: Secondary | ICD-10-CM | POA: Diagnosis not present

## 2018-01-24 DIAGNOSIS — I110 Hypertensive heart disease with heart failure: Secondary | ICD-10-CM | POA: Diagnosis not present

## 2018-01-25 DIAGNOSIS — R2689 Other abnormalities of gait and mobility: Secondary | ICD-10-CM | POA: Diagnosis not present

## 2018-01-25 DIAGNOSIS — R269 Unspecified abnormalities of gait and mobility: Secondary | ICD-10-CM | POA: Diagnosis not present

## 2018-01-27 DIAGNOSIS — R2689 Other abnormalities of gait and mobility: Secondary | ICD-10-CM | POA: Diagnosis not present

## 2018-01-27 DIAGNOSIS — R269 Unspecified abnormalities of gait and mobility: Secondary | ICD-10-CM | POA: Diagnosis not present

## 2018-01-31 DIAGNOSIS — R269 Unspecified abnormalities of gait and mobility: Secondary | ICD-10-CM | POA: Diagnosis not present

## 2018-01-31 DIAGNOSIS — R2689 Other abnormalities of gait and mobility: Secondary | ICD-10-CM | POA: Diagnosis not present

## 2018-02-03 DIAGNOSIS — R269 Unspecified abnormalities of gait and mobility: Secondary | ICD-10-CM | POA: Diagnosis not present

## 2018-02-03 DIAGNOSIS — R2689 Other abnormalities of gait and mobility: Secondary | ICD-10-CM | POA: Diagnosis not present

## 2018-02-03 NOTE — Telephone Encounter (Signed)
Order sent to [facility]

## 2018-02-03 NOTE — Telephone Encounter (Signed)
Spoke to patient and informed him Dr Radford Pax has changed his pressure setting to 11 cm H20 and get a download in 2 weeks.  Patient agrees with treatment and thanked me for calling.

## 2018-02-09 DIAGNOSIS — R269 Unspecified abnormalities of gait and mobility: Secondary | ICD-10-CM | POA: Diagnosis not present

## 2018-02-09 DIAGNOSIS — R2689 Other abnormalities of gait and mobility: Secondary | ICD-10-CM | POA: Diagnosis not present

## 2018-02-14 NOTE — Progress Notes (Signed)
Cardiology Office Note    Date:  02/15/2018   ID:  Marcus King, DOB 1941/01/28, MRN [id number]  PCP:  Rochel Brome, MD  Cardiologist: Fransico Him, MD  Chief Complaint  Patient presents with  . Follow-up    History of Present Illness:  Marcus King is a 77 y.o. male with history of hypertension, OSA on CPAP, anxiety, HLD, 2 previous caths in [facility] that were normal last one about 10 years ago according to patient.  Last seen by Dr. Radford Pax 01/14/18 with syncope and orthostasis.  He had seen a neurologist who recommended increasing fluid intake as it was felt to be secondary to dehydration.  He was orthostatic in the office dropping blood pressure from 122 down to 95 systolic.  She recommended compression stockings during the day and continued hydration.  Blood pressure readings from home are 850-277 systolic.  She felt if he continued to have episodes of dizziness or syncope we may need to consider stopping beta-blocker or possibly add Proamatine but she did not want to do this given his labile hypertension.  Patient comes in today accompanied by his wife.  He brings a list of his blood pressures from home taking it 4 times a day.  Still goes up in the early morning or in the evening before he takes his medications.  He has had no further dizziness or near syncope.  He is off balance but he is being followed by Dr. Lavell Anchors for this.    Past Medical History:  Diagnosis Date  . Anxiety   . Chronic pain of left knee 08/11/2016  . Food poisoning   . Headache    sinus  . Hypertension   . OSA on CPAP   . Prostate cancer ([place])   . 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 CARDIAC CATHETERIZATION    . CATARACT EXTRACTION W/ INTRAOCULAR LENS  IMPLANT, BILATERAL Bilateral   . COLON SURGERY  2014  . COLOSTOMY REVERSAL  2014  . HERNIA REPAIR     x2  . JOINT REPLACEMENT    . PROSTATE SURGERY  2006  .  SEPTOPLASTY    . TOTAL KNEE ARTHROPLASTY Left 09/28/2016   Procedure: TOTAL KNEE ARTHROPLASTY;  Surgeon: [name], MD;  Location: [facility];  Service: Orthopedics;  Laterality: Left;    Current Medications: Current Meds  Medication Sig  . amLODipine (NORVASC) 10 MG tablet Take 10 mg by mouth daily.   [name] aspirin EC 81 MG tablet Take 1 tablet (81 mg total) by mouth daily.  . busPIRone (BUSPAR) 15 MG tablet TK 1 T PO BID  . carvedilol (COREG) 25 MG tablet Take 25 mg by mouth 2 (two) times daily.   . furosemide (LASIX) 20 MG tablet Take 20 mg by mouth 2 (two) times daily.   [name] lisinopril (PRINIVIL,ZESTRIL) 40 MG tablet Take 40 mg by mouth 2 (two) times daily.  [name] PARoxetine (PAXIL) 40 MG tablet Take 40 mg by mouth daily.   . pravastatin (PRAVACHOL) 10 MG tablet Take 10 mg by mouth daily.     Allergies:   Beef-derived products; Clonazepam; Monosodium glutamate; and Pegademase bovine   Social History   Socioeconomic History  . Marital status: Married    Spouse name: None  . Number of children: 2  . Years of education: None  . Highest education level: High school graduate  Social Needs  . Financial resource strain:  None  . Food insecurity - worry: None  . Food insecurity - inability: None  . Transportation needs - medical: None  . Transportation needs - non-medical: None  Occupational History  . None  Tobacco Use  . Smoking status: Former Smoker    Years: 20.00    Types: Cigarettes    Last attempt to quit: 1979    Years since quitting: 40.2  . Smokeless tobacco: Never Used  . Tobacco comment: quit 37 years ago  Substance and Sexual Activity  . Alcohol use: No  . Drug use: No  . Sexual activity: None  Other Topics Concern  . None  Social History Narrative   LIves at home with his wife   Self employed   Right handed   Drinks about 2 cups of caffeine daily     Family History:  The patient's family history includes Leukemia in his sister; Lung disease in his father; Stroke  in his father and mother.   ROS:   Please see the history of present illness.    Review of Systems  Constitution: Negative.  HENT: Negative.   Cardiovascular: Negative.   Respiratory: Negative.   Endocrine: Negative.   Hematologic/Lymphatic: Negative.   Musculoskeletal: Negative.   Gastrointestinal: Negative.   Genitourinary: Negative.   Neurological: Positive for difficulty with concentration and loss of balance.  Psychiatric/Behavioral: Positive for memory loss.   All other systems reviewed and are negative.   PHYSICAL EXAM:   VS:  BP 128/70 (BP Location: Left Arm, Patient Position: Sitting, Cuff Size: Normal)   Pulse 60   Ht 5\' 10"  (1.778 m)   Wt 192 lb (87.1 kg)   SpO2 97%   BMI 27.55 kg/m   Physical Exam  GEN: Well nourished, well developed, in no acute distress  Neck: no JVD, carotid bruits, or masses Cardiac:RRR; no murmurs, rubs, or gallops  Respiratory:  clear to auscultation bilaterally, normal work of breathing GI: soft, nontender, nondistended, + BS Ext: without cyanosis, clubbing, or edema, Good distal pulses bilaterally Neuro:  Alert and Oriented x 3, Psych: euthymic mood, full affect  Wt Readings from Last 3 Encounters:  02/15/18 192 lb (87.1 kg)  01/14/18 186 lb 9.6 oz (84.6 kg)  12/01/17 184 lb 3.2 oz (83.6 kg)      Studies/Labs Reviewed:   EKG:  EKG is not ordered today.  Recent Labs: No results found for requested labs within last 8760 hours.   Lipid Panel No results found for: CHOL, TRIG, HDL, CHOLHDL, VLDL, LDLCALC, LDLDIRECT  Additional studies/ records that were reviewed today include:      ASSESSMENT:    1. Hypertensive heart disease with congestive heart failure, unspecified heart failure type ([place])   2. Orthostatic hypertension   3. Dyslipidemia   4. OSA on CPAP      PLAN:  In order of problems listed above:  Hypertensive heart disease patient's blood pressures are still running high early morning or in the evening before  he takes his medications.  I have asked him to take his medications 12 hours apart have better coverage.  I have also asked him to take a second dose of Lasix no later than 2 or 3 in the afternoon.  2 g sodium diet.  Patient is eating out fast food regularly.  Check 2D echo for systolic and diastolic function.  Orthostatic hypotension has resolved with staying hydrated.  If he cuts back on his sodium we could decrease his Lasix.  Edema has improved with  compression stockings.  Dyslipidemia continue Pravachol  Obstructive sleep apnea on CPAP with settings just adjusted by Dr. Radford Pax    Medication Adjustments/Labs and Tests Ordered: Current medicines are reviewed at length with the patient today.  Concerns regarding medicines are outlined above.  Medication changes, Labs and Tests ordered today are listed in the Patient Instructions below. Patient Instructions  Medication Instructions:  Your physician recommends that you continue on your current medications as directed. Please refer to the Current Medication list given to you today.  MAKE SURE YOU TAKE YOUR LASIX NO LATER THAN 2-3 IN THE AFTERNOON Labwork: None ordered  Testing/Procedures: Your physician has requested that you have an echocardiogram. Echocardiography is a painless test that uses sound waves to create images of your heart. It provides your doctor with information about the size and shape of your heart and how well your heart's chambers and valves are working. This procedure takes approximately one hour. There are no restrictions for this procedure.   Follow-Up: Your physician recommends that you schedule a follow-up appointment in: 5/2/9 ARRIVE AT 9:05 TO SEE DR. Radford Pax   Any Other Special Instructions Will Be Listed Below (If Applicable).  Echocardiogram An echocardiogram, or echocardiography, uses sound waves (ultrasound) to produce an image of your heart. The echocardiogram is simple, painless, obtained within a short  period of time, and offers valuable information to your health care provider. The images from an echocardiogram can provide information such as:  Evidence of coronary artery disease (CAD).  Heart size.  Heart muscle function.  Heart valve function.  Aneurysm detection.  Evidence of a past heart attack.  Fluid buildup around the heart.  Heart muscle thickening.  Assess heart valve function.  Tell a health care provider about:  Any allergies you have.  All medicines you are taking, including vitamins, herbs, eye drops, creams, and over-the-counter medicines.  Any problems you or family members have had with anesthetic medicines.  Any blood disorders you have.  Any surgeries you have had.  Any medical conditions you have.  Whether you are pregnant or may be pregnant. What happens before the procedure? No special preparation is needed. Eat and drink normally. What happens during the procedure?  In order to produce an image of your heart, gel will be applied to your chest and a wand-like tool (transducer) will be moved over your chest. The gel will help transmit the sound waves from the transducer. The sound waves will harmlessly bounce off your heart to allow the heart images to be captured in real-time motion. These images will then be recorded.  You may need an IV to receive a medicine that improves the quality of the pictures. What happens after the procedure? You may return to your normal schedule including diet, activities, and medicines, unless your health care provider tells you otherwise. This information is not intended to replace advice given to you by your health care provider. Make sure you discuss any questions you have with your health care provider. Document Released: 11/27/2000 Document Revised: 07/18/2016 Document Reviewed: 08/07/2013 Elsevier Interactive Patient Education  2017 [employer].     Low-Sodium Eating Plan Sodium, which is an element that  makes up salt, helps you maintain a healthy balance of fluids in your body. Too much sodium can increase your blood pressure and cause fluid and waste to be held in your body. Your health care provider or dietitian may recommend following this plan if you have high blood pressure (hypertension), kidney disease, liver disease,  or heart failure. Eating less sodium can help lower your blood pressure, reduce swelling, and protect your heart, liver, and kidneys. What are tips for following this plan? General guidelines  Most people on this plan should limit their sodium intake to 2,000 mg (milligrams) of sodium each day. Reading food labels  The Nutrition Facts label lists the amount of sodium in one serving of the food. If you eat more than one serving, you must multiply the listed amount of sodium by the number of servings.  Choose foods with less than 140 mg of sodium per serving.  Avoid foods with 300 mg of sodium or more per serving. Shopping  Look for lower-sodium products, often labeled as "low-sodium" or "no salt added."  Always check the sodium content even if foods are labeled as "unsalted" or "no salt added".  Buy fresh foods. ? Avoid canned foods and premade or frozen meals. ? Avoid canned, cured, or processed meats  Buy breads that have less than 80 mg of sodium per slice. Cooking  Eat more home-cooked food and less restaurant, buffet, and fast food.  Avoid adding salt when cooking. Use salt-free seasonings or herbs instead of table salt or sea salt. Check with your health care provider or pharmacist before using salt substitutes.  Cook with plant-based oils, such as canola, sunflower, or olive oil. Meal planning  When eating at a restaurant, ask that your food be prepared with less salt or no salt, if possible.  Avoid foods that contain MSG (monosodium glutamate). MSG is sometimes added to [place] food, bouillon, and some canned foods. What foods are recommended? The  items listed may not be a complete list. Talk with your dietitian about what dietary choices are best for you. Grains Low-sodium cereals, including oats, puffed wheat and rice, and shredded wheat. Low-sodium crackers. Unsalted rice. Unsalted pasta. Low-sodium bread. Whole-grain breads and whole-grain pasta. Vegetables Fresh or frozen vegetables. "No salt added" canned vegetables. "No salt added" tomato sauce and paste. Low-sodium or reduced-sodium tomato and vegetable juice. Fruits Fresh, frozen, or canned fruit. Fruit juice. Meats and other protein foods Fresh or frozen (no salt added) meat, poultry, seafood, and fish. Low-sodium canned tuna and salmon. Unsalted nuts. Dried peas, beans, and lentils without added salt. Unsalted canned beans. Eggs. Unsalted nut butters. Dairy Milk. Soy milk. Cheese that is naturally low in sodium, such as ricotta cheese, fresh mozzarella, or Swiss cheese Low-sodium or reduced-sodium cheese. Cream cheese. Yogurt. Fats and oils Unsalted butter. Unsalted margarine with no trans fat. Vegetable oils such as canola or olive oils. Seasonings and other foods Fresh and dried herbs and spices. Salt-free seasonings. Low-sodium mustard and ketchup. Sodium-free salad dressing. Sodium-free light mayonnaise. Fresh or refrigerated horseradish. Lemon juice. Vinegar. Homemade, reduced-sodium, or low-sodium soups. Unsalted popcorn and pretzels. Low-salt or salt-free chips. What foods are not recommended? The items listed may not be a complete list. Talk with your dietitian about what dietary choices are best for you. Grains Instant hot cereals. Bread stuffing, pancake, and biscuit mixes. Croutons. Seasoned rice or pasta mixes. Noodle soup cups. Boxed or frozen macaroni and cheese. Regular salted crackers. Self-rising flour. Vegetables Sauerkraut, pickled vegetables, and relishes. Olives. [place] fries. Onion rings. Regular canned vegetables (not low-sodium or reduced-sodium). Regular  canned tomato sauce and paste (not low-sodium or reduced-sodium). Regular tomato and vegetable juice (not low-sodium or reduced-sodium). Frozen vegetables in sauces. Meats and other protein foods Meat or fish that is salted, canned, smoked, spiced, or pickled. Bacon, ham, sausage, hotdogs,  corned beef, chipped beef, packaged lunch meats, salt pork, jerky, pickled herring, anchovies, regular canned tuna, sardines, salted nuts. Dairy Processed cheese and cheese spreads. Cheese curds. Blue cheese. Feta cheese. String cheese. Regular cottage cheese. Buttermilk. Canned milk. Fats and oils Salted butter. Regular margarine. Ghee. Bacon fat. Seasonings and other foods Onion salt, garlic salt, seasoned salt, table salt, and sea salt. Canned and packaged gravies. Worcestershire sauce. Tartar sauce. Barbecue sauce. Teriyaki sauce. Soy sauce, including reduced-sodium. Steak sauce. Fish sauce. Oyster sauce. Cocktail sauce. Horseradish that you find on the shelf. Regular ketchup and mustard. Meat flavorings and tenderizers. Bouillon cubes. Hot sauce and Tabasco sauce. Premade or packaged marinades. Premade or packaged taco seasonings. Relishes. Regular salad dressings. Salsa. Potato and tortilla chips. Corn chips and puffs. Salted popcorn and pretzels. Canned or dried soups. Pizza. Frozen entrees and pot pies. Summary  Eating less sodium can help lower your blood pressure, reduce swelling, and protect your heart, liver, and kidneys.  Most people on this plan should limit their sodium intake to 1,500-2,000 mg (milligrams) of sodium each day.  Canned, boxed, and frozen foods are high in sodium. Restaurant foods, fast foods, and pizza are also very high in sodium. You also get sodium by adding salt to food.  Try to cook at home, eat more fresh fruits and vegetables, and eat less fast food, canned, processed, or prepared foods. This information is not intended to replace advice given to you by your health care  provider. Make sure you discuss any questions you have with your health care provider. Document Released: 05/22/2002 Document Revised: 11/23/2016 Document Reviewed: 11/23/2016 Elsevier Interactive Patient Education  [employer].    If you need a refill on your cardiac medications before your next appointment, please call your pharmacy.      Sumner Boast, PA-C  02/15/2018 10:29 AM    [facility] [address], [place], [place]  [address] Phone: [phone]; Fax: [phone]

## 2018-02-15 ENCOUNTER — Encounter: Payer: Self-pay | Admitting: Physician Assistant

## 2018-02-15 ENCOUNTER — Ambulatory Visit (INDEPENDENT_AMBULATORY_CARE_PROVIDER_SITE_OTHER): Payer: Medicare Other | Admitting: Physician Assistant

## 2018-02-15 VITALS — BP 128/70 | HR 60 | Ht 70.0 in | Wt 192.0 lb

## 2018-02-15 DIAGNOSIS — Z9989 Dependence on other enabling machines and devices: Secondary | ICD-10-CM

## 2018-02-15 DIAGNOSIS — G4733 Obstructive sleep apnea (adult) (pediatric): Secondary | ICD-10-CM

## 2018-02-15 DIAGNOSIS — I110 Hypertensive heart disease with heart failure: Secondary | ICD-10-CM

## 2018-02-15 DIAGNOSIS — E785 Hyperlipidemia, unspecified: Secondary | ICD-10-CM

## 2018-02-15 DIAGNOSIS — I10 Essential (primary) hypertension: Secondary | ICD-10-CM

## 2018-02-15 NOTE — Patient Instructions (Addendum)
Medication Instructions:  Your physician recommends that you continue on your current medications as directed. Please refer to the Current Medication list given to you today.  MAKE SURE YOU TAKE YOUR LASIX NO LATER THAN 2-3 IN THE AFTERNOON Labwork: None ordered  Testing/Procedures: Your physician has requested that you have an echocardiogram. Echocardiography is a painless test that uses sound waves to create images of your heart. It provides your doctor with information about the size and shape of your heart and how well your heart's chambers and valves are working. This procedure takes approximately one hour. There are no restrictions for this procedure.   Follow-Up: Your physician recommends that you schedule a follow-up appointment in: 5/2/9 ARRIVE AT 9:05 TO SEE DR. Radford Pax   Any Other Special Instructions Will Be Listed Below (If Applicable).  Echocardiogram An echocardiogram, or echocardiography, uses sound waves (ultrasound) to produce an image of your heart. The echocardiogram is simple, painless, obtained within a short period of time, and offers valuable information to your health care provider. The images from an echocardiogram can provide information such as:  Evidence of coronary artery disease (CAD).  Heart size.  Heart muscle function.  Heart valve function.  Aneurysm detection.  Evidence of a past heart attack.  Fluid buildup around the heart.  Heart muscle thickening.  Assess heart valve function.  Tell a health care provider about:  Any allergies you have.  All medicines you are taking, including vitamins, herbs, eye drops, creams, and over-the-counter medicines.  Any problems you or family members have had with anesthetic medicines.  Any blood disorders you have.  Any surgeries you have had.  Any medical conditions you have.  Whether you are pregnant or may be pregnant. What happens before the procedure? No special preparation is needed. Eat  and drink normally. What happens during the procedure?  In order to produce an image of your heart, gel will be applied to your chest and a wand-like tool (transducer) will be moved over your chest. The gel will help transmit the sound waves from the transducer. The sound waves will harmlessly bounce off your heart to allow the heart images to be captured in real-time motion. These images will then be recorded.  You may need an IV to receive a medicine that improves the quality of the pictures. What happens after the procedure? You may return to your normal schedule including diet, activities, and medicines, unless your health care provider tells you otherwise. This information is not intended to replace advice given to you by your health care provider. Make sure you discuss any questions you have with your health care provider. Document Released: 11/27/2000 Document Revised: 07/18/2016 Document Reviewed: 08/07/2013 Elsevier Interactive Patient Education  2017 [place].     Low-Sodium Eating Plan Sodium, which is an element that makes up salt, helps you maintain a healthy balance of fluids in your body. Too much sodium can increase your blood pressure and cause fluid and waste to be held in your body. Your health care provider or dietitian may recommend following this plan if you have high blood pressure (hypertension), kidney disease, liver disease, or heart failure. Eating less sodium can help lower your blood pressure, reduce swelling, and protect your heart, liver, and kidneys. What are tips for following this plan? General guidelines  Most people on this plan should limit their sodium intake to 2,000 mg (milligrams) of sodium each day. Reading food labels  The Nutrition Facts label lists the amount of sodium in  one serving of the food. If you eat more than one serving, you must multiply the listed amount of sodium by the number of servings.  Choose foods with less than 140 mg of  sodium per serving.  Avoid foods with 300 mg of sodium or more per serving. Shopping  Look for lower-sodium products, often labeled as "low-sodium" or "no salt added."  Always check the sodium content even if foods are labeled as "unsalted" or "no salt added".  Buy fresh foods. ? Avoid canned foods and premade or frozen meals. ? Avoid canned, cured, or processed meats  Buy breads that have less than 80 mg of sodium per slice. Cooking  Eat more home-cooked food and less restaurant, buffet, and fast food.  Avoid adding salt when cooking. Use salt-free seasonings or herbs instead of table salt or sea salt. Check with your health care provider or pharmacist before using salt substitutes.  Cook with plant-based oils, such as canola, sunflower, or olive oil. Meal planning  When eating at a restaurant, ask that your food be prepared with less salt or no salt, if possible.  Avoid foods that contain MSG (monosodium glutamate). MSG is sometimes added to [place] food, bouillon, and some canned foods. What foods are recommended? The items listed may not be a complete list. Talk with your dietitian about what dietary choices are best for you. Grains Low-sodium cereals, including oats, puffed wheat and rice, and shredded wheat. Low-sodium crackers. Unsalted rice. Unsalted pasta. Low-sodium bread. Whole-grain breads and whole-grain pasta. Vegetables Fresh or frozen vegetables. "No salt added" canned vegetables. "No salt added" tomato sauce and paste. Low-sodium or reduced-sodium tomato and vegetable juice. Fruits Fresh, frozen, or canned fruit. Fruit juice. Meats and other protein foods Fresh or frozen (no salt added) meat, poultry, seafood, and fish. Low-sodium canned tuna and salmon. Unsalted nuts. Dried peas, beans, and lentils without added salt. Unsalted canned beans. Eggs. Unsalted nut butters. Dairy Milk. Soy milk. Cheese that is naturally low in sodium, such as ricotta cheese, fresh  mozzarella, or Swiss cheese Low-sodium or reduced-sodium cheese. Cream cheese. Yogurt. Fats and oils Unsalted butter. Unsalted margarine with no trans fat. Vegetable oils such as canola or olive oils. Seasonings and other foods Fresh and dried herbs and spices. Salt-free seasonings. Low-sodium mustard and ketchup. Sodium-free salad dressing. Sodium-free light mayonnaise. Fresh or refrigerated horseradish. Lemon juice. Vinegar. Homemade, reduced-sodium, or low-sodium soups. Unsalted popcorn and pretzels. Low-salt or salt-free chips. What foods are not recommended? The items listed may not be a complete list. Talk with your dietitian about what dietary choices are best for you. Grains Instant hot cereals. Bread stuffing, pancake, and biscuit mixes. Croutons. Seasoned rice or pasta mixes. Noodle soup cups. Boxed or frozen macaroni and cheese. Regular salted crackers. Self-rising flour. Vegetables Sauerkraut, pickled vegetables, and relishes. Olives. [place] fries. Onion rings. Regular canned vegetables (not low-sodium or reduced-sodium). Regular canned tomato sauce and paste (not low-sodium or reduced-sodium). Regular tomato and vegetable juice (not low-sodium or reduced-sodium). Frozen vegetables in sauces. Meats and other protein foods Meat or fish that is salted, canned, smoked, spiced, or pickled. Bacon, ham, sausage, hotdogs, corned beef, chipped beef, packaged lunch meats, salt pork, jerky, pickled herring, anchovies, regular canned tuna, sardines, salted nuts. Dairy Processed cheese and cheese spreads. Cheese curds. Blue cheese. Feta cheese. String cheese. Regular cottage cheese. Buttermilk. Canned milk. Fats and oils Salted butter. Regular margarine. Ghee. Bacon fat. Seasonings and other foods Onion salt, garlic salt, seasoned salt, table salt, and sea  salt. Canned and packaged gravies. Worcestershire sauce. Tartar sauce. Barbecue sauce. Teriyaki sauce. Soy sauce, including reduced-sodium.  Steak sauce. Fish sauce. Oyster sauce. Cocktail sauce. Horseradish that you find on the shelf. Regular ketchup and mustard. Meat flavorings and tenderizers. Bouillon cubes. Hot sauce and Tabasco sauce. Premade or packaged marinades. Premade or packaged taco seasonings. Relishes. Regular salad dressings. Salsa. Potato and tortilla chips. Corn chips and puffs. Salted popcorn and pretzels. Canned or dried soups. Pizza. Frozen entrees and pot pies. Summary  Eating less sodium can help lower your blood pressure, reduce swelling, and protect your heart, liver, and kidneys.  Most people on this plan should limit their sodium intake to 1,500-2,000 mg (milligrams) of sodium each day.  Canned, boxed, and frozen foods are high in sodium. Restaurant foods, fast foods, and pizza are also very high in sodium. You also get sodium by adding salt to food.  Try to cook at home, eat more fresh fruits and vegetables, and eat less fast food, canned, processed, or prepared foods. This information is not intended to replace advice given to you by your health care provider. Make sure you discuss any questions you have with your health care provider. Document Released: 05/22/2002 Document Revised: 11/23/2016 Document Reviewed: 11/23/2016 Elsevier Interactive Patient Education  [employer].    If you need a refill on your cardiac medications before your next appointment, please call your pharmacy.

## 2018-02-16 DIAGNOSIS — R269 Unspecified abnormalities of gait and mobility: Secondary | ICD-10-CM | POA: Diagnosis not present

## 2018-02-16 DIAGNOSIS — R2689 Other abnormalities of gait and mobility: Secondary | ICD-10-CM | POA: Diagnosis not present

## 2018-02-18 ENCOUNTER — Ambulatory Visit: Payer: Medicare Other | Admitting: Cardiology

## 2018-02-22 ENCOUNTER — Other Ambulatory Visit: Payer: Self-pay

## 2018-02-22 ENCOUNTER — Ambulatory Visit (HOSPITAL_COMMUNITY): Payer: Medicare Other | Attending: Cardiology

## 2018-02-22 DIAGNOSIS — I059 Rheumatic mitral valve disease, unspecified: Secondary | ICD-10-CM | POA: Insufficient documentation

## 2018-02-22 DIAGNOSIS — G4733 Obstructive sleep apnea (adult) (pediatric): Secondary | ICD-10-CM | POA: Insufficient documentation

## 2018-02-22 DIAGNOSIS — E785 Hyperlipidemia, unspecified: Secondary | ICD-10-CM | POA: Diagnosis not present

## 2018-02-22 DIAGNOSIS — I509 Heart failure, unspecified: Secondary | ICD-10-CM | POA: Insufficient documentation

## 2018-02-22 DIAGNOSIS — I110 Hypertensive heart disease with heart failure: Secondary | ICD-10-CM | POA: Diagnosis not present

## 2018-02-22 DIAGNOSIS — I77810 Thoracic aortic ectasia: Secondary | ICD-10-CM | POA: Insufficient documentation

## 2018-02-23 ENCOUNTER — Encounter: Payer: Self-pay | Admitting: *Deleted

## 2018-02-23 ENCOUNTER — Telehealth: Payer: Self-pay | Admitting: *Deleted

## 2018-02-23 DIAGNOSIS — I110 Hypertensive heart disease with heart failure: Secondary | ICD-10-CM

## 2018-02-23 NOTE — Telephone Encounter (Signed)
-----   Message from Imogene Burn, PA-C sent at 02/23/2018  3:12 PM EDT ----- Please  let patient know Dr. Lovena Le recommends [facility] and please place order.  Thank you

## 2018-02-23 NOTE — Telephone Encounter (Signed)
See result note.  

## 2018-02-23 NOTE — Progress Notes (Signed)
See result note.  

## 2018-02-28 ENCOUNTER — Encounter: Payer: Self-pay | Admitting: *Deleted

## 2018-03-03 ENCOUNTER — Telehealth (HOSPITAL_COMMUNITY): Payer: Self-pay | Admitting: *Deleted

## 2018-03-03 NOTE — Telephone Encounter (Signed)
Patient given detailed instructions per Myocardial Perfusion Study Information Sheet for the test on 03/08/18 at 10:00. Patient notified to arrive 15 minutes early and that it is imperative to arrive on time for appointment to keep from having the test rescheduled.  If you need to cancel or reschedule your appointment, please call the office within 24 hours of your appointment. . Patient verbalized understanding.Marcus King

## 2018-03-08 ENCOUNTER — Ambulatory Visit (HOSPITAL_COMMUNITY): Payer: Medicare Other | Attending: Cardiology

## 2018-03-08 DIAGNOSIS — I509 Heart failure, unspecified: Secondary | ICD-10-CM | POA: Diagnosis not present

## 2018-03-08 DIAGNOSIS — R55 Syncope and collapse: Secondary | ICD-10-CM

## 2018-03-08 DIAGNOSIS — I110 Hypertensive heart disease with heart failure: Secondary | ICD-10-CM | POA: Diagnosis not present

## 2018-03-08 LAB — MYOCARDIAL PERFUSION IMAGING
CHL CUP NUCLEAR SDS: 2
CHL CUP NUCLEAR SRS: 5
CHL CUP NUCLEAR SSS: 7
LV sys vol: 53 mL
LVDIAVOL: 111 mL (ref 62–150)
NUC STRESS TID: 0.83
Peak HR: 84 {beats}/min
RATE: 0.31
Rest HR: 78 {beats}/min

## 2018-03-08 MED ORDER — TECHNETIUM TC 99M TETROFOSMIN IV KIT
31.7000 | PACK | Freq: Once | INTRAVENOUS | Status: AC | PRN
  Administered 2018-03-08: 31.7 via INTRAVENOUS
  Filled 2018-03-08: qty 32

## 2018-03-08 MED ORDER — REGADENOSON 0.4 MG/5ML IV SOLN
0.4000 mg | Freq: Once | INTRAVENOUS | Status: AC
  Administered 2018-03-08: 0.4 mg via INTRAVENOUS

## 2018-03-08 MED ORDER — TECHNETIUM TC 99M TETROFOSMIN IV KIT
11.0000 | PACK | Freq: Once | INTRAVENOUS | Status: AC | PRN
  Administered 2018-03-08: 11 via INTRAVENOUS
  Filled 2018-03-08: qty 11

## 2018-03-08 NOTE — Progress Notes (Signed)
Pt has been made aware of normal result and verbalized understanding.  jw 03/08/18

## 2018-03-21 DIAGNOSIS — L821 Other seborrheic keratosis: Secondary | ICD-10-CM | POA: Diagnosis not present

## 2018-03-21 DIAGNOSIS — L300 Nummular dermatitis: Secondary | ICD-10-CM | POA: Diagnosis not present

## 2018-03-21 DIAGNOSIS — L578 Other skin changes due to chronic exposure to nonionizing radiation: Secondary | ICD-10-CM | POA: Diagnosis not present

## 2018-04-04 ENCOUNTER — Encounter: Payer: Self-pay | Admitting: Neurology

## 2018-04-04 ENCOUNTER — Ambulatory Visit (INDEPENDENT_AMBULATORY_CARE_PROVIDER_SITE_OTHER): Payer: Medicare Other | Admitting: Neurology

## 2018-04-04 VITALS — BP 136/80 | HR 71 | Ht 70.0 in | Wt 181.0 lb

## 2018-04-04 DIAGNOSIS — I951 Orthostatic hypotension: Secondary | ICD-10-CM

## 2018-04-04 DIAGNOSIS — G3184 Mild cognitive impairment, so stated: Secondary | ICD-10-CM

## 2018-04-04 NOTE — Patient Instructions (Addendum)
[facility] MIND Diet Dr. Si Raider on Thursday  Memory Compensation Strategies  1. Use "WARM" strategy.  W= write it down  A= associate it  R= repeat it  M= make a mental note  2.   You can keep a Social worker.  Use a 3-ring notebook with sections for the following: calendar, important names and phone numbers,  medications, doctors' names/phone numbers, lists/reminders, and a section to journal what you did  each day.   3.    Use a calendar to write appointments down.  4.    Write yourself a schedule for the day.  This can be placed on the calendar or in a separate section of the Memory Notebook.  Keeping a  regular schedule can help memory.  5.    Use medication organizer with sections for each day or morning/evening pills.  You may need help loading it  6.    Keep a basket, or pegboard by the door.  Place items that you need to take out with you in the basket or on the pegboard.  You may also want to  include a message board for reminders.  7.    Use sticky notes.  Place sticky notes with reminders in a place where the task is performed.  For example: " turn off the  stove" placed by the stove, "lock the door" placed on the door at eye level, " take your medications" on  the bathroom mirror or by the place where you normally take your medications.  8.    Use alarms/timers.  Use while cooking to remind yourself to check on food or as a reminder to take your medicine, or as a  reminder to make a call, or as a reminder to perform another task, etc.

## 2018-04-04 NOTE — Progress Notes (Signed)
[facility]    Provider:  Dr Jaynee Eagles Referring Provider: Rochel Brome, MD Primary Care Physician:  Rochel Brome, MD  CC:  Syncope and collapse  Interval history: patient here for follow up of syncope and memory loss. And anxiety.  - Orthostatic syncope:  Syncope due to orthostasis and he is trying to hydrate better and seeing cardiology frequently.  Compression stockings were also advised. EEG was normal. He is drinking more fluids.  He is wearing compression stockings as well and the swelling is better. No syncopal episodes. No dizziness when standing.   - Balance: Improved with Physical Therapy. Recommended Yoga or silver sneakers.  - Memory: Seeing Dr. Si Raider for formal neurocognitive testing. Still continues to struggle with memory. Forgetting conversations.   - Anxiety: Needs therapy, discussed   HPI:  Marcus King is a 77 y.o. male here as a referral from Dr. Tobie Poet for syncope and collapse. PMHx hypertensive left vent hypertrophy, hld, anxiety, OSA on cpap. Patient had a recent syncopal episode in the setting of a systolic of 83. Today he is orthostatic in the office. He ha had some labile BPs for 40 years. He had a knee replacement in 2017 and around 5pm would sundown become very confused but that resolved after the surgery. He has memory problems. He forgets names, in the last 12 months feel memory progressively worsening. Here with wife of 68 years. He had worsened dizziness since hitting his head. He doesn't exercise regularly since the age of 79.   Syncopal episode: He was dehydrated and he passed out the first week of November. Has happened multiple times in the past. In the setting of low blood pressures. He fell and had a LOC and hit his head. He has had multiple episodes of orthostatic syncope. They have changed his medication in the past. He also had some side effects in the past to medication including Clonazepam. Discussed orthostatic hypotension.  The night  afterwards they went to dinner, he was very unstable, he was confused, walking with one foot in front of the right. He insisted on having dinner and they helped him to a table. When he got up to leave he didn't notice his shoe was not on.  She had to help him undress at home. He did not remember anything from the dinner, going there or eating, wife says at the time he was answering appropriately, knew his friends. He doesn't remember anything. No hx of seizure-like events, other episodes of confusion (except the sundowing after surgery). When his blood pressure is low he can act confused. Wife did not check his BP that night however. He admits to not drinking water or wearing compression stockings. His urine is dark. Dizzy on standing.   Memory loss: Started about a year ago after the surgery. Started with more name loss. He forgets conversations sometimes but not often, he went to a play and couldn't remember the name of it the next day (the nutcracker). He has some difficulty remembering things in the newspaper. Driving and directions are fine. No accidents in the home, no falls, no swallowing difficulties. He pays company bills, not missing any or double paying. He takes his medications reliably, he monitors blood pressure. He is still social, still going out with friends. He has anxiety but no depression. He has sleep apnea and is compliant with the cpap.  He feels well rested and not snoring through the cpap. Does not repeat things int he same day.No hallucinations or delusions. No problems  with date and year. No confusion with traveling. No problems with money or tipping. No trouble finding words. He may forget where he dines last week. He loses things in the house. He is still working. No hx of demenita in the family but parents died at 63 (father) and mother dies of a stroke at 64.     Reviewed notes, labs and imaging from outside physicians, which showed:  Reviewed primary care notes.  Patient had  an episode of syncope approximately first week of November.  He lost his balance and fell backwards, slurred speech, hit his head, had loss of conscious for 3 minutes and was disoriented.  Refused to go to the emergency department.  1 week prior to that he got out of the car and started walking funny.  He went to dinner but did not remember the entire dinner.  He tried to leave the restaurant was missing issue under the table and did not realize it.  He has not been diagnosed with any form of amnesia or dementia, mental status appears to be gradually deteriorating over the last 12 months, but he has gotten confused with directions in the past, overall appearance is clean, behavior is described as anxious, no new drugs recently.  He had previously been evaluated for altered mental status and that was at that time attributed to sundowning which occurred 1 year ago when hospitalized.  Review of Systems: Patient complains of symptoms per HPI as well as the following symptoms: memory loss, dizziness, passing out, anxiety, decreased energy, itching. Pertinent negatives and positives per HPI. All others negative.   Social History   Socioeconomic History  . Marital status: Married    Spouse name: Not on file  . Number of children: 2  . Years of education: Not on file  . Highest education level: High school graduate  Occupational History  . Not on file  Social Needs  . Financial resource strain: Not on file  . Food insecurity:    Worry: Not on file    Inability: Not on file  . Transportation needs:    Medical: Not on file    Non-medical: Not on file  Tobacco Use  . Smoking status: Former Smoker    Years: 20.00    Types: Cigarettes    Last attempt to quit: 1979    Years since quitting: 40.3  . Smokeless tobacco: Never Used  . Tobacco comment: quit 37 years ago  Substance and Sexual Activity  . Alcohol use: No  . Drug use: No  . Sexual activity: Not on file  Lifestyle  . Physical activity:      Days per week: Not on file    Minutes per session: Not on file  . Stress: Not on file  Relationships  . Social connections:    Talks on phone: Not on file    Gets together: Not on file    Attends religious service: Not on file    Active member of club or organization: Not on file    Attends meetings of clubs or organizations: Not on file    Relationship status: Not on file  . Intimate partner violence:    Fear of current or ex partner: Not on file    Emotionally abused: Not on file    Physically abused: Not on file    Forced sexual activity: Not on file  Other Topics Concern  . Not on file  Social History Narrative   LIves at home with his  wife   Self employed   Right handed   Drinks about 2 cups of caffeine daily    Family History  Problem Relation Age of Onset  . Stroke Mother   . Lung disease Father   . Stroke Father   . Leukemia Sister   . Alzheimer's disease Neg Hx     Past Medical History:  Diagnosis Date  . Anxiety   . Chronic pain of left knee 08/11/2016  . Food poisoning   . Headache    sinus  . Hypertension   . OSA on CPAP   . Prostate cancer ([place])   . 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 CARDIAC CATHETERIZATION    . CATARACT EXTRACTION W/ INTRAOCULAR LENS  IMPLANT, BILATERAL Bilateral   . COLON SURGERY  2014  . COLOSTOMY REVERSAL  2014  . HERNIA REPAIR     x2  . JOINT REPLACEMENT    . PROSTATE SURGERY  2006  . SEPTOPLASTY    . TOTAL KNEE ARTHROPLASTY Left 09/28/2016   Procedure: TOTAL KNEE ARTHROPLASTY;  Surgeon: [name], MD;  Location: [facility];  Service: Orthopedics;  Laterality: Left;    Current Outpatient Medications  Medication Sig Dispense Refill  . amLODipine (NORVASC) 10 MG tablet Take 10 mg by mouth daily.     [name] aspirin EC 81 MG tablet Take 1 tablet (81 mg total) by mouth daily. 30 tablet 11  . busPIRone (BUSPAR) 15 MG tablet TK 1 T PO BID  0  .  carvedilol (COREG) 25 MG tablet Take 25 mg by mouth 2 (two) times daily.     . cloNIDine (CATAPRES) 0.1 MG tablet Take 0.1 mg by mouth daily.    . furosemide (LASIX) 20 MG tablet Take 20 mg by mouth 2 (two) times daily.     [name] lisinopril (PRINIVIL,ZESTRIL) 40 MG tablet Take 40 mg by mouth 2 (two) times daily.    [name] PARoxetine (PAXIL) 40 MG tablet Take 40 mg by mouth daily.     . pravastatin (PRAVACHOL) 10 MG tablet Take 10 mg by mouth daily.     No current facility-administered medications for this visit.     Allergies as of 04/04/2018 - Review Complete 04/04/2018  Allergen Reaction Noted  . Beef-derived products Diarrhea 09/15/2016  . Clonazepam Other (See Comments) 09/15/2016  . Monosodium glutamate Other (See Comments) 09/25/2016  . Pegademase bovine Diarrhea 09/15/2016    Vitals: BP 136/80   Pulse 71   Ht 5\' 10"  (1.778 m)   Wt 181 lb (82.1 kg)   BMI 25.97 kg/m  Last Weight:  Wt Readings from Last 1 Encounters:  04/04/18 181 lb (82.1 kg)   Last Height:   Ht Readings from Last 1 Encounters:  04/04/18 5\' 10"  (1.778 m)    Physical exam: Exam: Gen: NAD, conversant, well nourised, well groomed                     CV: RRR, no MRG. No Carotid Bruits. No peripheral edema, warm, nontender Eyes: Conjunctivae clear without exudates or hemorrhage  Neuro: Detailed Neurologic Exam  Speech:    Speech is normal; fluent and spontaneous with normal comprehension.  Cognition:    The patient is oriented to person, place, and time;   MMSE - Mini Mental State Exam 12/01/2017  Orientation to time 5  Orientation to Place 5  Registration 3  Attention/ Calculation 5  Recall 1  Language- name 2 objects 2  Language- repeat 1  Language- follow 3 step command 3  Language- read & follow direction 1  Write a sentence 1  Copy design 1  Total score 28       recent and remote memory impaired ;     language fluent;     normal attention, concentration,  fund of knowledge Cranial  Nerves:    The pupils are equal, round, and reactive to light. Attempted fundoscopic exam could not visualize. Visual fields are full to finger confrontation. Extraocular movements are intact. Trigeminal sensation is intact and the muscles of mastication are normal. The face is symmetric. The palate elevates in the midline. Hearing intact. Voice is normal. Shoulder shrug is normal. The tongue has normal motion without fasciculations.   Coordination:    Normal finger to nose and heel to shin. Normal rapid alternating movements.   Gait:    Heel-toe normal, tandem with difficulty  Motor Observation:    No asymmetry, no atrophy, and no involuntary movements noted. Tone:    Normal muscle tone.    Posture:    Posture is normal. normal erect    Strength:    Strength is V/V in the upper and lower limbs.      Sensation:  +Romberg, Dec pin prick and proprioception distally lower extremities      Reflex Exam:  DTR's:    Deep tendon reflexes in the upper and lower extremities are symmetrical bilaterally.   Toes:    The toes are downgoing bilaterally.   Clonus:    Clonus is absent.    Assessment/Plan:  77 y.o. male here as a referral from Dr. Tobie Poet for syncope and collapse. PMHx hypertensive left vent hypertrophy, labile blood pressure and orthostatic hypotension, hld, anxiety, OSA on cpap  - Patient orthostatic today systolic 412 laying to systolic 91 sitting. Likely cause of syncopal episodes. - Orthostasis and syncope: not drinking fluids, needs compression stockings, discussed non-pharmacologic strategies. Also appears he may not be taking his medications correctly due to cognitive decline, will request nursing to the home for medication evaluation and management - EEG: Episode of confusion in the setting of benzo use, most likely medication related and low likelihood of complex seizure but need to evaluate EEG WAS NORMAL - PT for abnormal gait and imbalance, orthostatic hypotension, (will  ask PT to see if they can custom measure for compression stockings?). Wife will accompany him to a [facility] outpatient facility - Formal neurocognitive testing for memory loss likely MCI or early Alzheimer's disease - Anxiety: recommend geriatric psychiatrist and therapy: Dr. Norma Fredrickson or PA Eino Farber at [facility], discussed with daughter OTP after appointment - They brought up Holter monitor: will defer to cardiology, talk to pcp - Unlikely TIA but can take daily ASA for prevention and follow with pcp for vascular risk factors - Neuropathy: check B12, tsh, hgba1c all normal - FDG PET scan: consider after above workup - For anxiety call medicare and find a provider  Cc: Dr. Tobie Poet  Daughter: Criss Rosales [phone] [place] [address] [address], [facility] [address] [place], [place] [address]  Phone [phone] Fax [phone]

## 2018-04-07 ENCOUNTER — Encounter: Payer: Self-pay | Admitting: Psychology

## 2018-04-07 ENCOUNTER — Ambulatory Visit: Payer: Medicare Other | Admitting: Psychology

## 2018-04-07 ENCOUNTER — Ambulatory Visit (INDEPENDENT_AMBULATORY_CARE_PROVIDER_SITE_OTHER): Payer: Medicare Other | Admitting: Psychology

## 2018-04-07 ENCOUNTER — Encounter

## 2018-04-07 DIAGNOSIS — F411 Generalized anxiety disorder: Secondary | ICD-10-CM

## 2018-04-07 DIAGNOSIS — R413 Other amnesia: Secondary | ICD-10-CM

## 2018-04-07 NOTE — Progress Notes (Addendum)
   Neuropsychology Note  Marcus King completed 60 minutes of neuropsychological testing with technician, Milana Kidney, BS, under the supervision of Dr. Macarthur Critchley, Licensed Psychologist. The patient did not appear overtly distressed by the testing session, per behavioral observation or via self-report to the technician. Rest breaks were offered.   Clinical Decision Making: In considering the patient's current level of functioning, level of presumed impairment, nature of symptoms, emotional and behavioral responses during the interview, level of literacy, and observed level of motivation/effort, a battery of tests was selected and communicated to the psychometrician.  Communication between the psychologist and technician was ongoing throughout the testing session and changes were made as deemed necessary based on patient performance on testing, technician observations and additional pertinent factors such as those listed above.  Marcus King will return within approximately 2 weeks for an interactive feedback session with Dr. Si Raider at which time his test performances, clinical impressions and treatment recommendations will be reviewed in detail. The patient understands he can contact our office should he require our assistance before this time.  15 minutes spent performing neuropsychological evaluation services/clinical decision making (psychologist). [CPT 24097] 60 minutes spent face-to-face with patient administering standardized tests, 30 minutes spent scoring (technician). [CPT Y8200648, 35329]  Full report to follow.

## 2018-04-07 NOTE — Progress Notes (Signed)
NEUROBEHAVIORAL STATUS EXAM   Name: Marcus King Date of Birth: 10-11-1941 Date of Interview: 04/07/2018  Reason for Referral:  Marcus King (goes by "[name]") is a 77 y.o. male who is referred for neuropsychological evaluation by Dr. Sarina Ill of [facility] due to concerns about memory loss. This patient is accompanied in the office by his wife who supplements the history.  History of Presenting Problem:  [name] and his wife reported new onset of memory difficulties after he underwent TKR surgery in October 2017. Immediately after the surgery he experienced altered mental status at nighttime and had to go into a nursing facility and have overnight supervision. This resolved but he and his family noticed ongoing short term memory difficulties. The major issue as I understand it is that he is forgetting recent conversations he has had, things he has read, and things he has watched on television. Prompting does not help. He has more difficulty recalling names. He has demonstrated some repeating, per his wife, but this is not significant. They deny any trouble with attention/concentration or distractibility. He is not forgetting/missing appointments or having any difficulty navigating when driving.   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 He continues to manage all instrumental ADLs but there are some concerns about his management of his medication. He refuses to use daily pill planners because the way he takes his blood pressure medication varies by what his BP is that day. He denies any problems managing his medication, states he does not forget or get confused about it. On one occasion he did tell his wife he could not recall whether or not he had taken his medication that night. Today he demonstrates some confusion about his med list and which anxiety medication he is taking twice a day (he first insisted he was taking Paxil twice a day  then realized it is Buspar he is taking twice a day). He manages the finances for his business and denies any difficulty with this. His wife has always done the home finances. He is still driving. He manages his appointments and his wife also keeps track of them just in case.  He has had significant difficulty with orthostatic hypotension (ongoing) which has caused syncope and falls. He did hit his head in the context of one fall, they are not sure when this was. He did lose consciousness for about 3 minutes. He admits he is not properly hydrated and he has a tendency to get up too quickly. The patient was first seen by Dr. Jaynee Eagles in 11/2017 for syncope. MMSE was 28/30. He had an EEG which was normal.   He notes he has had several surgeries in recent years and his wife wonders if repeated anesthesia has contributed to his memory decline.  There is no family history of dementia. His father was an alcoholic and died at age 52. His mother had Parkinson's disease and died at age 46 after a stroke.  The patient has a lifelong history of anxiety. He worries about things he cannot control. He wants to "fix everything". He is currently taking Paxil and Buspar. He has taken clonazepam in the past but seemed to have more confusion when he was on that. Dr. Jaynee Eagles recommended he see Eino Farber for psychiatry medication management, but he did not like the way the receptionist on the phone treated him so he refused to go. His PCP prescribes his psychotropic medications. He has never had counseling/therapy. He admits  to increased anxiety lately. He is very worried about their son who is on disability for a head injury. He is very close and involved with both of his adult children. His wife feels he has been dwelling on the negative, and seems to be more unhappy than he used to be. He denies suicidal ideation or intention. He sleeps fairly well. He is compliant with CPAP for sleep apnea.. He does not drink any  alcohol.   Social History: Education: high Printmaker. He notes that his "IQ is 2" and it has always bothered him that he didn't have the opportunity to get a higher level of education. 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 He eventually bought his partner out and was managing the business himself. He closed the office 1 1/2 years ago, but states he still does some part time work from home. Marital history: Married x55 years Children: One son and one daughter Alcohol: None Tobacco: None. Former smoker   Medical History: Past Medical History:  Diagnosis Date  . Anxiety   . Chronic pain of left knee 08/11/2016  . Food poisoning   . Headache    sinus  . Hypertension   . OSA on CPAP   . Prostate cancer ([place])   . 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 amLODipine (NORVASC) 10 MG tablet Take 10 mg by mouth daily.   [name] aspirin EC 81 MG tablet Take 1 tablet (81 mg total) by mouth daily.  . busPIRone (BUSPAR) 15 MG tablet TK 1 T PO BID  . carvedilol (COREG) 25 MG tablet Take 25 mg by mouth 2 (two) times daily.   . cloNIDine (CATAPRES) 0.1 MG tablet Take 0.1 mg by mouth daily.  . furosemide (LASIX) 20 MG tablet Take 20 mg by mouth 2 (two) times daily.   [name] lisinopril (PRINIVIL,ZESTRIL) 40 MG tablet Take 40 mg by mouth 2 (two) times daily.  [name] PARoxetine (PAXIL) 40 MG tablet Take 40 mg by mouth daily.   . pravastatin (PRAVACHOL) 10 MG tablet Take 10 mg by mouth daily.   No facility-administered encounter medications on file as of 04/07/2018.      Behavioral Observations:   Appearance: Neatly and appropriately dressed and groomed Gait: Ambulated independently, no gross abnormalities observed Speech: Fluent; normal rate, rhythm and volume. No significant word finding  difficulty. Some repeating. Thought process: Logical, somewhat tangential Affect: Full, generally bright Interpersonal: Pleasant, appropriate but mildly disinhibited (his wife states this is his baseline)   55 minutes spent face-to-face with patient completing neurobehavioral status exam. 40 minutes spent integrating medical records/clinical data and completing this report. CPT codes [id number] unit; [id number] unit.   TESTING: There is medical necessity to proceed with neuropsychological assessment as the results will be used to aid in differential diagnosis and clinical decision-making and to inform specific treatment recommendations. Per the patient, his wife and medical records reviewed, there has been a change in cognitive functioning and a reasonable suspicion of neurocognitive disorder (rule out MCI, early stage AD, cognitive effects of anesthesia/surgery, pseudodementia from anxiety/stress).  Clinical Decision Making: In considering the patient's current level of functioning, level of presumed impairment, nature of symptoms, emotional and behavioral responses during the interview, level of literacy, and observed level of motivation, a battery of tests was selected and communicated  to the psychometrician.   Following the clinical interview/neurobehavioral status exam, the patient completed this full battery of neuropsychological testing with my psychometrician under my supervision (see separate note).   PLAN: The patient will return to see me for a follow-up session at which time his test performances and my impressions and treatment recommendations will be reviewed in detail.  Evaluation ongoing; full report to follow.

## 2018-04-14 ENCOUNTER — Ambulatory Visit (INDEPENDENT_AMBULATORY_CARE_PROVIDER_SITE_OTHER): Payer: Medicare Other

## 2018-04-14 ENCOUNTER — Encounter: Payer: Self-pay | Admitting: Cardiology

## 2018-04-14 ENCOUNTER — Ambulatory Visit (INDEPENDENT_AMBULATORY_CARE_PROVIDER_SITE_OTHER): Payer: Medicare Other | Admitting: Cardiology

## 2018-04-14 VITALS — BP 122/60 | HR 68 | Ht 70.0 in | Wt 183.0 lb

## 2018-04-14 DIAGNOSIS — G4733 Obstructive sleep apnea (adult) (pediatric): Secondary | ICD-10-CM

## 2018-04-14 DIAGNOSIS — Z9989 Dependence on other enabling machines and devices: Secondary | ICD-10-CM

## 2018-04-14 DIAGNOSIS — I119 Hypertensive heart disease without heart failure: Secondary | ICD-10-CM

## 2018-04-14 DIAGNOSIS — I10 Essential (primary) hypertension: Secondary | ICD-10-CM

## 2018-04-14 DIAGNOSIS — R001 Bradycardia, unspecified: Secondary | ICD-10-CM

## 2018-04-14 DIAGNOSIS — R600 Localized edema: Secondary | ICD-10-CM | POA: Diagnosis not present

## 2018-04-14 HISTORY — DX: Localized edema: R60.0

## 2018-04-14 NOTE — Patient Instructions (Signed)
Medication Instructions:  Your physician recommends that you continue on your current medications as directed. Please refer to the Current Medication list given to you today.  If you need a refill on your cardiac medications, please contact your pharmacy first.  Labwork: None ordered   Testing/Procedures: Your physician has recommended that you wear an event monitor. Event monitors are medical devices that record the heart's electrical activity. Doctors most often [place] these monitors to diagnose arrhythmias. Arrhythmias are problems with the speed or rhythm of the heartbeat. The monitor is a small, portable device. You can wear one while you do your normal daily activities. This is usually used to diagnose what is causing palpitations/syncope (passing out).  Follow-Up: Your physician recommends that you schedule a follow-up appointment in: 4 weeks with Estella Husk, PA   Your physician wants you to follow-up in: 6 months with Dr. Radford Pax. You will receive a reminder letter in the mail two months in advance. If you don't receive a letter, please call our office to schedule the follow-up appointment.  Any Other Special Instructions Will Be Listed Below (If Applicable).   Thank you for choosing [facility], RN  [phone]  If you need a refill on your cardiac medications before your next appointment, please call your pharmacy.

## 2018-04-14 NOTE — Progress Notes (Addendum)
Cardiology Office Note:    Date:  04/14/2018   ID:  [name], DOB [birth year], MRN [id number]  PCP:  Rochel Brome, MD  Cardiologist:  Fransico Him, MD    Referring MD: Rochel Brome, MD   Chief Complaint  Patient presents with  . Hypertension  . Sleep Apnea    History of Present Illness:    Marcus King is a 77 y.o. male with a hx of HTN, anxiety, syncope secondary to orthostasis from dehydration and OSA on CPAP.  He was seen by Estella Husk, PA 02/15/2018 for followup of orthostatic hypotension and HTN.  He had not had any further dizzy spells. Unfortunately he had been eating fast food quite a bit and was instructed to follow a low sodium diet.  His LE edema has improved with compression hose.  2D echo showed mild LV dysfunction with EF 45-50% with diffuse HK and mildly dilated aortic root.  Nuclear stress test showed no ischemia and EF 52%.    He is here today for followup but is very concerned about issues with his blood pressure.  He says that about 45 to 50 years ago he started having blood pressure problems with anxiety.  For most of the time he is been on 4-6 blood pressure medicines.  He says about 1-1/2 weeks ago things completely changed with his heart.  He started having the orthostatic hypotension that he did see [name] for.  He also noted that his heart rate was varying anywhere from 48 to 80 bpm.  He said at times it felt like his heart was going to stop.  He is also been having terrible night sweats for the first time in his life.  His wife states that they are having to change his bed sheets several times a night.  He does have a CPAP but has not been able to use it recently because it was not working.  He has had to reduce his blood pressure medicines now down to 2 which include carvedilol and furosemide.  He is very concerned about what is going on with his heartbeat.  He said his blood pressure yesterday was as low as 102/59 mmHg with a heart rate of 49 but then blood  pressure later in the day was as high as 157/93 mmHg the heart rate 85.  He is in therapy for his anxiet.    He denies any chest pain or pressure, SOB, DOE, PND, orthopnea, LE edema,any further dizziness, palpitations or syncope. He is compliant with his meds and is tolerating meds with no SE.    Past Medical History:  Diagnosis Date  . Anxiety   . Chronic pain of left knee 08/11/2016  . Food poisoning   . Headache    sinus  . Hypertension   . OSA on CPAP   . Prostate cancer ([place])   . 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 CARDIAC CATHETERIZATION    . CATARACT EXTRACTION W/ INTRAOCULAR LENS  IMPLANT, BILATERAL Bilateral   . COLON SURGERY  2014  . COLOSTOMY REVERSAL  2014  . HERNIA REPAIR     x2  . JOINT REPLACEMENT    . PROSTATE SURGERY  2006  . SEPTOPLASTY    . TOTAL KNEE ARTHROPLASTY Left 09/28/2016   Procedure: TOTAL KNEE ARTHROPLASTY;  Surgeon: [name], MD;  Location: [facility];  Service: Orthopedics;  Laterality: Left;    Current Medications:  Current Meds  Medication Sig  . carvedilol (COREG) 25 MG tablet Take 25 mg by mouth 2 (two) times daily.   . furosemide (LASIX) 20 MG tablet Take 20 mg by mouth 2 (two) times daily.   [name] PARoxetine (PAXIL) 40 MG tablet Take 40 mg by mouth daily.   . pravastatin (PRAVACHOL) 10 MG tablet Take 10 mg by mouth daily.  . [DISCONTINUED] amLODipine (NORVASC) 10 MG tablet Take 10 mg by mouth daily.   . [DISCONTINUED] aspirin EC 81 MG tablet Take 1 tablet (81 mg total) by mouth daily.  . [DISCONTINUED] busPIRone (BUSPAR) 15 MG tablet TK 1 T PO BID  . [DISCONTINUED] cloNIDine (CATAPRES) 0.1 MG tablet Take 0.1 mg by mouth daily.     Allergies:   Beef-derived products; Clonazepam; Monosodium glutamate; and Pegademase bovine   Social History   Socioeconomic History  . Marital status: Married    Spouse name: Not on file  . Number of children: 2  . Years of  education: Not on file  . Highest education level: High school graduate  Occupational History  . Not on file  Social Needs  . Financial resource strain: Not on file  . Food insecurity:    Worry: Not on file    Inability: Not on file  . Transportation needs:    Medical: Not on file    Non-medical: Not on file  Tobacco Use  . Smoking status: Former Smoker    Years: 20.00    Types: Cigarettes    Last attempt to quit: 1979    Years since quitting: 40.3  . Smokeless tobacco: Never Used  . Tobacco comment: quit 37 years ago  Substance and Sexual Activity  . Alcohol use: No  . Drug use: No  . Sexual activity: Not on file  Lifestyle  . Physical activity:    Days per week: Not on file    Minutes per session: Not on file  . Stress: Not on file  Relationships  . Social connections:    Talks on phone: Not on file    Gets together: Not on file    Attends religious service: Not on file    Active member of club or organization: Not on file    Attends meetings of clubs or organizations: Not on file    Relationship status: Not on file  Other Topics Concern  . Not on file  Social History Narrative   LIves at home with his wife   Self employed   Right handed   Drinks about 2 cups of caffeine daily     Family History: The patient's family history includes Leukemia in his sister; Lung disease in his father; Stroke in his father and mother. There is no history of Alzheimer's disease.  ROS:   Please see the history of present illness.    ROS  All other systems reviewed and negative.   EKGs/Labs/Other Studies Reviewed:    The following studies were reviewed today: none  EKG:  EKG is  ordered today and showed NSR at 63bpm with no ST changes  Recent Labs: No results found for requested labs within last 8760 hours.   Recent Lipid Panel No results found for: CHOL, TRIG, HDL, CHOLHDL, VLDL, LDLCALC, LDLDIRECT  Physical Exam:    VS:  BP 122/60   Pulse 68   Ht 5\' 10"  (1.778 m)    Wt 183 lb (83 kg)   SpO2 96%   BMI 26.26 kg/m     Wt  Readings from Last 3 Encounters:  04/14/18 183 lb (83 kg)  04/04/18 181 lb (82.1 kg)  03/08/18 192 lb (87.1 kg)     GEN:  Well nourished, well developed in no acute distress HEENT: Normal NECK: No JVD; No carotid bruits LYMPHATICS: No lymphadenopathy CARDIAC: RRR, no murmurs, rubs, gallops RESPIRATORY:  Clear to auscultation without rales, wheezing or rhonchi  ABDOMEN: Soft, non-tender, non-distended MUSCULOSKELETAL:  No edema; No deformity  SKIN: Warm and dry NEUROLOGIC:  Alert and oriented x 3 PSYCHIATRIC:  Normal affect   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 OSA on CPAP   3. Orthostatic hypertension   4. Edema extremities   5. Bradycardia    PLAN:    In order of problems listed above:  1.  HTN - BP is well controlled on current meds.  He is now down to 1 medicatios for blood pressure which are carvedilol 25 mg twice daily and blood pressure is well controlled on exam today with a BP of 122/60 mmHg heart rate 68 bpm.  2.  OSA - the patient is tolerating PAP therapy but has had some problems with his device not working recently.   The patient has been using and benefiting from PAP use and will continue to benefit from therapy. I will get a download from his DME.   3.  Orthostatic hypotension - he has not had any further dizzy spells.  He is trying to stay hydrated and wear compression hose.  4.  LE edema - resolved on diuretics and compression hose. Continue Lasix 20mg  daily.  Creatinine was 1.2 on 01/2018.    5.  Bradycardia -this is subjective with him taking his pulse.  I suspect he may be having PVCs that are not perfused and his pulse and that is why he is feeling his heart rates are low.  I am going to get an event monitor to try to correlate symptoms with bradycardia arrhythmias.   Medication Adjustments/Labs and Tests Ordered: Current medicines are reviewed at length with  the patient today.  Concerns regarding medicines are outlined above.  Orders Placed This Encounter  Procedures  . CARDIAC EVENT MONITOR  . EKG 12-Lead   No orders of the defined types were placed in this encounter.   Signed, Fransico Him, MD  04/14/2018 9:19 PM    Marcus King

## 2018-04-22 ENCOUNTER — Telehealth: Payer: Self-pay | Admitting: *Deleted

## 2018-04-22 NOTE — Telephone Encounter (Signed)
-----   Message from Sueanne Margarita, MD sent at 04/21/2018  3:34 PM EDT ----- Good AHI on PAP but needs to improve compliance

## 2018-04-22 NOTE — Telephone Encounter (Signed)
Informed patient of titration results and verbalized understanding was indicated. Patient is aware and agreeable to AHI being within range at 2.4. Patient is aware and agreeable to improve on compliance with machine usage Patient is aware and agreeable to treatment.

## 2018-04-26 NOTE — Progress Notes (Signed)
NEUROPSYCHOLOGICAL EVALUATION   Name:    Marcus King  Date of Birth:   [birth year] Date of Interview:  04/07/2018 Date of Testing:  04/07/2018   Date of Feedback:  05/03/2018       Background Information:  Reason for Referral:  Marcus King (goes by "[name]") is a 77 y.o. male referred by Dr. Sarina Ill of [facility] to assess his current level of cognitive functioning and assist in differential diagnosis. The current evaluation consisted of a review of available medical records, an interview with the patient and his wife, and the completion of a neuropsychological testing battery. Informed consent was obtained.  History of Presenting Problem:  Mr. Chadderdon and his wife reported new onset of memory difficulties after he underwent TKR surgery in October 2017. Immediately after the surgery he experienced altered mental status at nighttime and had to go into a nursing facility and have overnight supervision. This resolved but he and his family noticed ongoing short term memory difficulties. The major issue as I understand it is that he is forgetting recent conversations he has had, things he has read, and things he has watched on television. Prompting does not help. He has more difficulty recalling names. He has demonstrated some repeating, per his wife, but this is not significant. They deny any trouble with attention/concentration or distractibility. He is not forgetting/missing appointments or having any difficulty navigating when driving.   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 He continues to manage all instrumental ADLs but there are some concerns about his management of his medication. He refuses to use daily pill planners because the way he takes his blood pressure medication varies by what his BP is that day. He denies any problems managing his medication, states he does not forget or get confused about it. On one occasion he did tell  his wife he could not recall whether or not he had taken his medication that night. Today he demonstrates some confusion about his med list and which anxiety medication he is taking twice a day (he first insisted he was taking Paxil twice a day then realized it is Buspar he is taking twice a day). He manages the finances for his business and denies any difficulty with this. His wife has always done the home finances. He is still driving. He manages his appointments and his wife also keeps track of them just in case.  He has had significant difficulty with orthostatic hypotension (ongoing) which has caused syncope and falls. He did hit his head in the context of one fall, they are not sure when this was. He did lose consciousness for about 3 minutes. He admits he is not properly hydrated and he has a tendency to get up too quickly. The patient was first seen by Dr. Jaynee Eagles in 11/2017 for syncope. MMSE was 28/30. He had an EEG which was normal.   He notes he has had several surgeries in recent years and his wife wonders if repeated anesthesia has contributed to his memory decline.  There is no family history of dementia. His father was an alcoholic and died at age 9. His mother had Parkinson's disease and died at age 70 after a stroke.  The patient has a lifelong history of anxiety. He worries about things he cannot control. He wants to "fix everything". He is currently taking Paxil and Buspar. He has taken clonazepam in the past but seemed to have more confusion when he was  on that. Dr. Jaynee Eagles recommended he see Eino Farber for psychiatry medication management, but he did not like the way the receptionist on the phone treated him so he refused to go. His PCP prescribes his psychotropic medications. He has never had counseling/therapy. He admits to increased anxiety lately. He is very worried about their son who is on disability for a head injury. He is very close and involved with both of his adult children.  His wife feels he has been dwelling on the negative, and seems to be more unhappy than he used to be. He denies suicidal ideation or intention. He sleeps fairly well. He is compliant with CPAP for sleep apnea. He does not drink any alcohol.   Social History: Education: high Printmaker. He notes that his "IQ is 24" and it has always bothered him that he didn't have the opportunity to get a higher level of education. 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 He eventually bought his partner out and was managing the business himself. He closed the office 1 1/2 years ago, but states he still does some part time work from home. Marital history: Married x55 years Children: One son and one daughter Alcohol: None Tobacco: None. Former smoker    Medical History:  Past Medical History:  Diagnosis Date  . Anxiety   . Chronic pain of left knee 08/11/2016  . Food poisoning   . Headache    sinus  . Hypertension   . OSA on CPAP   . Prostate cancer ([place])   . 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 carvedilol (COREG) 25 MG tablet Take 25 mg by mouth 2 (two) times daily.   . furosemide (LASIX) 20 MG tablet Take 20 mg by mouth 2 (two) times daily.   [name] lisinopril (PRINIVIL,ZESTRIL) 40 MG tablet Take 40 mg by mouth 2 (two) times daily.  [name] PARoxetine (PAXIL) 40 MG tablet Take 40 mg by mouth daily.   . pravastatin (PRAVACHOL) 10 MG tablet Take 10 mg by mouth daily.   No facility-administered encounter medications on file as of 05/03/2018.    Patient is also taking Buspirone 15 mg two times daily.   Current Examination:  Behavioral Observations:  Appearance: Neatly and appropriately dressed and groomed Gait: Ambulated independently, no gross abnormalities observed Speech: Fluent; normal  rate, rhythm and volume. No significant word finding difficulty. Some repeating. Thought process: Logical, somewhat tangential Affect: Full, generally bright Interpersonal: Pleasant, appropriate but mildly disinhibited (his wife states this is his baseline) Orientation: Oriented to all spheres. Accurately named the current President and his predecessor.   Tests Administered: . Test of Premorbid Functioning (TOPF) . Wechsler Adult Intelligence Scale-Fourth Edition (WAIS-IV): Similarities, Music therapist, Coding and Digit Span subtests . Wechsler Memory Scale-Fourth Edition (WMS-IV) Older Adult Version (ages 16-90): Logical Memory I, II and Recognition subtests  . [employer] Verbal Learning Test - 2nd Edition (CVLT-2) Short Form . Repeatable Battery for the Assessment of Neuropsychological Status (RBANS) Form A:  Figure Copy and Recall subtests and Semantic Fluency subtest . Boston Naming Test (BNT) . Boston Diagnostic Aphasia Examination: Complex Ideational Material subtest . Controlled Oral Word Association Test (COWAT) . Trail Making Test A and B . Clock drawing test . Beck Depression Inventory - 2nd Edition (BDI-II) . Generalized Anxiety Disorder - 7 item screener (  GAD-7)  Test Results: Note: Standardized scores are presented only for use by appropriately trained professionals and to allow for any future test-retest comparison. These scores should not be interpreted without consideration of all the information that is contained in the rest of the report. The most recent standardization samples from the test publisher or other sources were used whenever possible to derive standard scores; scores were corrected for age, gender, ethnicity and education when available.   Test Scores:  Test Name Raw Score Standardized Score Descriptor  TOPF 47/70 SS= 105 Average  WAIS-IV Subtests     Similarities 15/36 ss= 7 Low average  Block Design 33/66 ss= 11 Average  Coding 44/135 ss= 10 Average  Digit  Span Forward 9/16 ss= 9 Average  Digit Span Backward 7/16 ss= 9 Average  WMS-IV Subtests     LM I 25/53 ss= 8 Average  LM II 12/39 ss= 9 Average  LM II Recognition 19/23 Cum %: 51-75 WNL  RBANS Subtests     Figure Copy 16/20 Z= -1 Low average  Figure Recall 10/20 Z= -0.6 Average  Semantic Fluency 10 Z= -1.9 Borderline  CVLT-II Scores     Trial 1 2/9 Z= -3 Severely impaired  Trial 4 6/9 Z= -1 Low average  Trials 1-4 total 14/36 T= 25 Impaired  SD Free Recall 6/9 Z= -0.5 Average  LD Free Recall 0/9 Z= -2 Impaired  LD Cued Recall 6/9 Z= 0 Average  Recognition Discriminability 8/9 hits, 4 false positives Z= -0.5 Average  Forced Choice Recognition 9/9  WNL  BNT 32/60 T= 23 Severely impaired  BDAE Complex Ideational Material 12/12  WNL  COWAT-FAS 18 T= 36 Borderline  COWAT-Animals 7 T= 28 Impaired   Trail Making Test A  56" 0 errors T= 45 Average  Trail Making Test B  5 errors (Maximum errors made at I to J - time at that mark was 204") T= <20 Severely impaired  Clock Drawing   WNL  BDI-II 14/63  Mild  GAD-7 21/21  Severe      Description of Test Results:  Premorbid verbal intellectual abilities were estimated to have been within the average range based on a test of word reading. Psychomotor processing speed was average. Auditory attention and working memory were average. Visual-spatial construction was average. Language abilities were variable. Specifically, confrontation naming was severely impaired, and semantic verbal fluency was impaired. Auditory comprehension of complex ideational material was intact. With regard to verbal memory, encoding and acquisition of non-contextual information (i.e., word list) was impaired. After a brief distracter task, free recall was average (6/9 items). After a delay, free recall was impaired (0/9 items). Cued recall was average (6/9 items). Performance on a yes/no recognition task was average overall although he did commit somewhat elevated number  of false positive errors. On another verbal memory test, encoding and acquisition of contextual auditory information (i.e., short stories) was average. After a delay, free recall was average. Performance on a yes/no recognition task was normal. With regard to non-verbal memory, delayed free recall of visual information was average. Executive functioning was variable. Mental flexibility and set-shifting were severely impaired on Trails B. Verbal fluency with phonemic search restrictions was borderline. Verbal abstract reasoning was low average. Performance on a clock drawing task was normal. On a self-report measure of mood, the patient's responses were indicative of clinically significant depression at the present time. Symptoms endorsed included: sadness much of the time, pessimism, feelings of failure, anhedonia, loss of self confidence, self-criticalness, tearfulness,  restlessness, reduced energy, irritability, concentration difficulty, fatigue and reduced libido. He denied suicidal ideation or intention. On a self-report measure of anxiety, the patient endorsed severe, clinically significant generalized anxiety characterized by daily nervousness, excessive worries, inability to control worrying, difficulty relaxing, restlessness, irritability and fear of something awful happening.    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 Results of cognitive evaluation revealed many performances in the normal range for age and commensurate with estimated baseline abilities. However, there was evidence of impaired semantic retrieval (confrontation naming and verbal fluency) and executive dysfunction (mental flexibility/set-shifting and encoding/retrieval of non-contextual information). His test results and current level of functioning are consistent with a diagnosis of Mild Cognitive Impairment. Diagnostic criteria for dementia are NOT met. His cognitive  profile is suggestive of mild disruption of frontal-subcortical networks. This could be due to cerebrovascular disease and/or cerebral hypoperfusion in the context of multiple episodes of syncope/orthostatic hypotension.  The patient also presents with severe generalized anxiety and some mild depression. I suspect these factors are exacerbating underlying MCI and contributing to cognitive difficulties in daily life.    Recommendations/Plan: Based on the findings of the present evaluation, the following recommendations are offered:  1. Treatment for anxiety and depression: The patient is now scheduled to see Eino Farber, psychiatry provider, for initial evaluation in the near future. I encouraged him to keep this appointment. I also encouraged him to consider counseling (CBT) for generalized anxiety. 2. Optimal control of vascular risk factors (blood pressure) is of course of utmost importance for brain health. 3. Neuropsychological re-evaluation in 1-2 years is recommended in order to monitor cognitive functioning, track any change or progression in symptoms, and further assist with treatment planning. 4. He should use daily AM and PM pill planners for his medications. 5. He should limit his driving to familiar locations in close geographic proximity, avoid night driving. 6. Cognitive strategies for executive function difficulties were reviewed.   Feedback to Patient: Cortlin Marano and his wife returned for a feedback appointment on 05/03/2018 to review the results of his neuropsychological evaluation with this provider. 30 minutes face-to-face time was spent reviewing his test results, my impressions and my recommendations as detailed above.    Total time spent on this patient's case: 95 minutes for neurobehavioral status exam with psychologist (CPT code [phone], [phone]); 90 minutes of testing/scoring by psychometrician under psychologist's supervision (CPT codes [phone], [phone] units); 180 minutes  for integration of patient data, interpretation of standardized test results and clinical data, clinical decision making, treatment planning and preparation of this report, and interactive feedback with review of results to the patient/family by psychologist (CPT codes [phone], [phone] units).      Thank you for your referral of [facility]. Please feel free to contact me if you have any questions or concerns regarding this report.

## 2018-05-03 ENCOUNTER — Ambulatory Visit (INDEPENDENT_AMBULATORY_CARE_PROVIDER_SITE_OTHER): Payer: Medicare Other | Admitting: Psychology

## 2018-05-03 ENCOUNTER — Encounter: Payer: Self-pay | Admitting: Psychology

## 2018-05-03 DIAGNOSIS — R413 Other amnesia: Secondary | ICD-10-CM | POA: Diagnosis not present

## 2018-05-03 DIAGNOSIS — G3184 Mild cognitive impairment, so stated: Secondary | ICD-10-CM

## 2018-05-03 DIAGNOSIS — F411 Generalized anxiety disorder: Secondary | ICD-10-CM

## 2018-05-03 NOTE — Patient Instructions (Addendum)
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 Results of cognitive evaluation revealed many performances in the normal range for age and commensurate with estimated baseline abilities. However, there was evidence of impaired semantic retrieval (confrontation naming and verbal fluency) and executive dysfunction (mental flexibility/set-shifting and encoding/retrieval of non-contextual information). Test results and current level of functioning are consistent with a diagnosis of Mild Cognitive Impairment. Diagnostic criteria for dementia are NOT met. Mild cognitive changes could be due to cerebrovascular disease and/or cerebral hypoperfusion in the context of multiple episodes of syncope/orthostatic hypotension. Severe generalized anxiety (characterized by daily nervousness, excessive worries, inability to control worrying, difficulty relaxing, restlessness, irritability and fear of something awful happening) is likely contributing to cognitive symptoms as well.   Recommendations/Plan: Based on the findings of the present evaluation, the following recommendations are offered:  1. Treatment for anxiety and depression 2. Optimal control of vascular risk factors (blood pressure) 3. Re-evaluation in 1-2 years 4. Use daily pillbox for medications 5. Limit driving to familiar locations in close geographic proximity, avoid night driving 6. Compensatory strategies for executive function difficulties may be helpful (see below)  Strategies to enhance cognitive functioning Attention, concentration, memory encoding and consolidation    . Make a plan and be prepared o If you find that you are more attentive at certain times of the day (i.e., the morning), plan important activities and discussions at that time o Determine which activities take the most time and which are most important, then prioritize your "to do list" based on this  information o Break tasks into simpler parts, understand the steps involved before starting o Rehearse the steps mentally or write them down. If you write them down, you can use this as a checklist to check off as you complete them. o Visualize completing the task  . Use external aids  o Write everything down that you do not need to know or work with right now. Don't store extra information in your brain that you don't need right now.  o Use a calendar or planner to make checklists, due dates and "to do" lists. o Use ONLY ONE calendar or planner and look at it frequently o Set alarms for important deadlines or appointments  . Minimize interruptions and distractions  o Find a good work environment, e.g., quiet room with a desk, close curtains, use earplugs, mask sounds with a fan or white noise machine o Turn off cell phone and/or email alerts during important tasks. In fact, it is helpful to schedule a block of time each day where you limit phone and email interruptions and focus on just the more detailed work you have. o Try to minimize the amount of background noise (i.e., television, music) when engaged in important tasks or conversations with others (note that some individuals find soft background music helpful in minimize distraction, so you may need to experiment with optimal level of noise for specific situations)  . Use active effort = consciously attending to details, closely analyzing o Failures of encoding may reflect failure to attend to one's own actions o Be prepared to work more slowly than you usually do  o When reading, allow time for re-reading sections  o Check your work for errors  . Avoid multitasking o Do not attempt to complete more than one task at one time. Focus on one task until it is completed and then move on to the next one. o Avoid other activities while engaged in important tasks, such  as talking on the phone while balancing the checkbook, making a shopping  list during a meeting.   . Use self-talk during tasks o Repeat the steps of the activity to yourself as you complete them o Talk to yourself about your progress o This helps you maintain focus on the task and makes it easier to remember completing the task (Similar to "active effort" above)  . Conserve energy o Conserve energy to avoid fatigue and its effects on cognition - Get enough sleep - Pace yourself  and make sure to take breaks - Be open to receiving help - Exercise for increased energy  . Conversational vigilance = paying attention during a conversation  o Listen actively: focus on the speaker and position yourself so that you can clearly hear the him/her, and have open/relaxed posture  o Eye contact: Maintaining eye contact with the person you are speaking with may increase the likelihood that important information is properly received  o Ask questions: Ask questions for clarification (e.g., request that the speaker explain something in a different way) or ask for information to be repeated if you become distracted, or if you do not hear or understand something during a conversation  o Paraphrase: Summarize or repeat back important information from a conversation in your own words to facilitate communication and ensure that you have heard correctly and understand

## 2018-05-13 DIAGNOSIS — R001 Bradycardia, unspecified: Secondary | ICD-10-CM

## 2018-05-18 ENCOUNTER — Encounter: Payer: Self-pay | Admitting: Physician Assistant

## 2018-05-18 ENCOUNTER — Ambulatory Visit (INDEPENDENT_AMBULATORY_CARE_PROVIDER_SITE_OTHER): Payer: Medicare Other | Admitting: Physician Assistant

## 2018-05-18 VITALS — BP 110/48 | HR 73 | Ht 70.0 in | Wt 183.0 lb

## 2018-05-18 DIAGNOSIS — F419 Anxiety disorder, unspecified: Secondary | ICD-10-CM | POA: Diagnosis not present

## 2018-05-18 DIAGNOSIS — I119 Hypertensive heart disease without heart failure: Secondary | ICD-10-CM

## 2018-05-18 DIAGNOSIS — I493 Ventricular premature depolarization: Secondary | ICD-10-CM | POA: Diagnosis not present

## 2018-05-18 DIAGNOSIS — I951 Orthostatic hypotension: Secondary | ICD-10-CM | POA: Diagnosis not present

## 2018-05-18 DIAGNOSIS — G3184 Mild cognitive impairment, so stated: Secondary | ICD-10-CM | POA: Diagnosis not present

## 2018-05-18 DIAGNOSIS — E785 Hyperlipidemia, unspecified: Secondary | ICD-10-CM | POA: Diagnosis not present

## 2018-05-18 DIAGNOSIS — F909 Attention-deficit hyperactivity disorder, unspecified type: Secondary | ICD-10-CM | POA: Diagnosis not present

## 2018-05-18 HISTORY — DX: Ventricular premature depolarization: I49.3

## 2018-05-18 NOTE — Patient Instructions (Addendum)
Medication Instructions:  Your physician recommends that you continue on your current medications as directed. Please refer to the Current Medication list given to you today.   Labwork: none  Testing/Procedures: Your physician has recommended that you wear a holter monitor. Holter monitors are medical devices that record the heart's electrical activity. Doctors most often use these monitors to diagnose arrhythmias. Arrhythmias are problems with the speed or rhythm of the heartbeat. The monitor is a small, portable device. You can wear one while you do your normal daily activities. This is usually used to diagnose what is causing palpitations/syncope (passing out).    Follow-Up: Your physician wants you to follow-up in: 6 months with Dr. Radford Pax.  You will receive a reminder letter in the mail two months in advance. If you don't receive a letter, please call our office to schedule the follow-up appointment.  Low-Sodium Eating Plan Sodium, which is an element that makes up salt, helps you maintain a healthy balance of fluids in your body. Too much sodium can increase your blood pressure and cause fluid and waste to be held in your body. Your health care provider or dietitian may recommend following this plan if you have high blood pressure (hypertension), kidney disease, liver disease, or heart failure. Eating less sodium can help lower your blood pressure, reduce swelling, and protect your heart, liver, and kidneys. What are tips for following this plan? General guidelines  Most people on this plan should limit their sodium intake to 1,500-2,000 mg (milligrams) of sodium each day. Reading food labels  The Nutrition Facts label lists the amount of sodium in one serving of the food. If you eat more than one serving, you must multiply the listed amount of sodium by the number of servings.  Choose foods with less than 140 mg of sodium per serving.  Avoid foods with 300 mg of sodium or more per  serving. Shopping  Look for lower-sodium products, often labeled as "low-sodium" or "no salt added."  Always check the sodium content even if foods are labeled as "unsalted" or "no salt added".  Buy fresh foods. ? Avoid canned foods and premade or frozen meals. ? Avoid canned, cured, or processed meats  Buy breads that have less than 80 mg of sodium per slice. Cooking  Eat more home-cooked food and less restaurant, buffet, and fast food.  Avoid adding salt when cooking. Use salt-free seasonings or herbs instead of table salt or sea salt. Check with your health care provider or pharmacist before using salt substitutes.  Cook with plant-based oils, such as canola, sunflower, or olive oil. Meal planning  When eating at a restaurant, ask that your food be prepared with less salt or no salt, if possible.  Avoid foods that contain MSG (monosodium glutamate). MSG is sometimes added to [place] food, bouillon, and some canned foods. What foods are recommended? The items listed may not be a complete list. Talk with your dietitian about what dietary choices are best for you. Grains Low-sodium cereals, including oats, puffed wheat and rice, and shredded wheat. Low-sodium crackers. Unsalted rice. Unsalted pasta. Low-sodium bread. Whole-grain breads and whole-grain pasta. Vegetables Fresh or frozen vegetables. "No salt added" canned vegetables. "No salt added" tomato sauce and paste. Low-sodium or reduced-sodium tomato and vegetable juice. Fruits Fresh, frozen, or canned fruit. Fruit juice. Meats and other protein foods Fresh or frozen (no salt added) meat, poultry, seafood, and fish. Low-sodium canned tuna and salmon. Unsalted nuts. Dried peas, beans, and lentils without added salt.  Unsalted canned beans. Eggs. Unsalted nut butters. Dairy Milk. Soy milk. Cheese that is naturally low in sodium, such as ricotta cheese, fresh mozzarella, or Swiss cheese Low-sodium or reduced-sodium cheese. Cream  cheese. Yogurt. Fats and oils Unsalted butter. Unsalted margarine with no trans fat. Vegetable oils such as canola or olive oils. Seasonings and other foods Fresh and dried herbs and spices. Salt-free seasonings. Low-sodium mustard and ketchup. Sodium-free salad dressing. Sodium-free light mayonnaise. Fresh or refrigerated horseradish. Lemon juice. Vinegar. Homemade, reduced-sodium, or low-sodium soups. Unsalted popcorn and pretzels. Low-salt or salt-free chips. What foods are not recommended? The items listed may not be a complete list. Talk with your dietitian about what dietary choices are best for you. Grains Instant hot cereals. Bread stuffing, pancake, and biscuit mixes. Croutons. Seasoned rice or pasta mixes. Noodle soup cups. Boxed or frozen macaroni and cheese. Regular salted crackers. Self-rising flour. Vegetables Sauerkraut, pickled vegetables, and relishes. Olives. [place] fries. Onion rings. Regular canned vegetables (not low-sodium or reduced-sodium). Regular canned tomato sauce and paste (not low-sodium or reduced-sodium). Regular tomato and vegetable juice (not low-sodium or reduced-sodium). Frozen vegetables in sauces. Meats and other protein foods Meat or fish that is salted, canned, smoked, spiced, or pickled. Bacon, ham, sausage, hotdogs, corned beef, chipped beef, packaged lunch meats, salt pork, jerky, pickled herring, anchovies, regular canned tuna, sardines, salted nuts. Dairy Processed cheese and cheese spreads. Cheese curds. Blue cheese. Feta cheese. String cheese. Regular cottage cheese. Buttermilk. Canned milk. Fats and oils Salted butter. Regular margarine. Ghee. Bacon fat. Seasonings and other foods Onion salt, garlic salt, seasoned salt, table salt, and sea salt. Canned and packaged gravies. Worcestershire sauce. Tartar sauce. Barbecue sauce. Teriyaki sauce. Soy sauce, including reduced-sodium. Steak sauce. Fish sauce. Oyster sauce. Cocktail sauce. Horseradish that you  find on the shelf. Regular ketchup and mustard. Meat flavorings and tenderizers. Bouillon cubes. Hot sauce and Tabasco sauce. Premade or packaged marinades. Premade or packaged taco seasonings. Relishes. Regular salad dressings. Salsa. Potato and tortilla chips. Corn chips and puffs. Salted popcorn and pretzels. Canned or dried soups. Pizza. Frozen entrees and pot pies. Summary  Eating less sodium can help lower your blood pressure, reduce swelling, and protect your heart, liver, and kidneys.  Most people on this plan should limit their sodium intake to 1,500-2,000 mg (milligrams) of sodium each day.  Canned, boxed, and frozen foods are high in sodium. Restaurant foods, fast foods, and pizza are also very high in sodium. You also get sodium by adding salt to food.  Try to cook at home, eat more fresh fruits and vegetables, and eat less fast food, canned, processed, or prepared foods. This information is not intended to replace advice given to you by your health care provider. Make sure you discuss any questions you have with your health care provider. Document Released: 05/22/2002 Document Revised: 11/23/2016 Document Reviewed: 11/23/2016 Elsevier Interactive Patient Education  2018 [place] will receive a reminder letter in the mail two months in advance. If you don't receive a letter, please call our office to schedule the follow-up appointment.   Any Other Special Instructions Will Be Listed Below (If Applicable).     If you need a refill on your cardiac medications before your next appointment, please call your pharmacy.

## 2018-05-18 NOTE — Progress Notes (Signed)
Cardiology Office Note    Date:  05/18/2018   ID:  Marcus King, DOB [birth year], MRN [id number]  PCP:  Rochel Brome, MD  Cardiologist: Fransico Him, MD  Chief Complaint  Patient presents with  . Follow-up    History of Present Illness:  Marcus King is a 77 y.o. male with history of hypertension, syncope secondary to orthostatic hypotension and dehydration, anxiety.  He has had 2 previous cath in [facility] that were normal, the last one about 10 years ago according to the patient.  I saw the patient 02/2018 at which time he was having some elevated blood pressures but was eating fast food regularly.  I ordered a 2D echo which showed Mild global LV systolic dysfunction LVEF 45 to 50% with diffuse hypokinesis and grade 1 DD.  Left atrium was moderately dilated.  Because of the LV dysfunction Lexiscan was ordered and was low risk with mild LV dysfunction EF 52% no ischemia or infarct.  Patient then saw Dr. Radford Pax 04/14/2018 and blood pressure was well controlled he was down to only carvedilol.  There was some concern on the patient side about bradycardia but she thought he was having PVCs that were not perfused when he took his pulse.  Event monitor showed normal sinus rhythm average heart rate 64 bpm with sinus tachycardia up to 121 bpm and occasional PVCs and ventricular couplets.  Patient comes in today for follow-up accompanied by his wife.  He is concerned because his blood pressure still run high in the morning when he gets up sometimes 170/90.  During the day they are much lower.  He still eating quite a bit of salt and fast food.  He even salts his cantaloupe.   Past Medical History:  Diagnosis Date  . Anxiety   . Chronic pain of left knee 08/11/2016  . Food poisoning   . Headache    sinus  . Hypertension   . OSA on CPAP   . Prostate cancer ([place])   . 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 CARDIAC  CATHETERIZATION    . CATARACT EXTRACTION W/ INTRAOCULAR LENS  IMPLANT, BILATERAL Bilateral   . COLON SURGERY  2014  . COLOSTOMY REVERSAL  2014  . HERNIA REPAIR     x2  . JOINT REPLACEMENT    . PROSTATE SURGERY  2006  . SEPTOPLASTY    . TOTAL KNEE ARTHROPLASTY Left 09/28/2016   Procedure: TOTAL KNEE ARTHROPLASTY;  Surgeon: [name], MD;  Location: [place];  Service: Orthopedics;  Laterality: Left;    Current Medications: Current Meds  Medication Sig  . amLODipine (NORVASC) 10 MG tablet Take 1 tablet by mouth daily.  . busPIRone (BUSPAR) 15 MG tablet Take 1 tablet by mouth 2 (two) times daily.  . carvedilol (COREG) 25 MG tablet Take 25 mg by mouth 2 (two) times daily.   . furosemide (LASIX) 20 MG tablet Take 20 mg by mouth 2 (two) times daily.   [name] lisinopril (PRINIVIL,ZESTRIL) 40 MG tablet Take 40 mg by mouth 2 (two) times daily.  [name] PARoxetine (PAXIL) 40 MG tablet Take 40 mg by mouth daily.   . pravastatin (PRAVACHOL) 10 MG tablet Take 10 mg by mouth daily.     Allergies:   Beef-derived products; Clonazepam; Monosodium glutamate; and Pegademase bovine   Social History   Socioeconomic History  . Marital status: Married    Spouse name: Not on  file  . Number of children: 2  . Years of education: Not on file  . Highest education level: High school graduate  Occupational History  . Not on file  Social Needs  . Financial resource strain: Not on file  . Food insecurity:    Worry: Not on file    Inability: Not on file  . Transportation needs:    Medical: Not on file    Non-medical: Not on file  Tobacco Use  . Smoking status: Former Smoker    Years: 20.00    Types: Cigarettes    Last attempt to quit: 1979    Years since quitting: 40.4  . Smokeless tobacco: Never Used  . Tobacco comment: quit 37 years ago  Substance and Sexual Activity  . Alcohol use: No  . Drug use: No  . Sexual activity: Not on file  Lifestyle  . Physical activity:    Days per week: Not on file     Minutes per session: Not on file  . Stress: Not on file  Relationships  . Social connections:    Talks on phone: Not on file    Gets together: Not on file    Attends religious service: Not on file    Active member of club or organization: Not on file    Attends meetings of clubs or organizations: Not on file    Relationship status: Not on file  Other Topics Concern  . Not on file  Social History Narrative   LIves at home with his wife   Self employed   Right handed   Drinks about 2 cups of caffeine daily     Family History:  The patient's family history includes Leukemia in his sister; Lung disease in his father; Stroke in his father and mother.   ROS:   Please see the history of present illness.    Review of Systems  Constitution: Negative.  HENT: Negative.   Cardiovascular: Negative.   Respiratory: Negative.   Endocrine: Negative.   Hematologic/Lymphatic: Negative.   Musculoskeletal: Negative.   Gastrointestinal: Negative.   Genitourinary: Negative.   Neurological: Negative.    All other systems reviewed and are negative.   PHYSICAL EXAM:   VS:  BP (!) 110/48   Pulse 73   Ht 5\' 10"  (1.778 m)   Wt 183 lb (83 kg)   SpO2 95%   BMI 26.26 kg/m   Physical Exam  GEN: Well nourished, well developed, in no acute distress  Neck: no JVD, carotid bruits, or masses Cardiac:RRR; no murmurs, rubs, or gallops  Respiratory:  clear to auscultation bilaterally, normal work of breathing GI: soft, nontender, nondistended, + BS Ext: without cyanosis, clubbing, or edema, Good distal pulses bilaterally Neuro:  Alert and Oriented x 3 Psych: euthymic mood, full affect  Wt Readings from Last 3 Encounters:  05/18/18 183 lb (83 kg)  04/14/18 183 lb (83 kg)  04/04/18 181 lb (82.1 kg)      Studies/Labs Reviewed:   EKG:  EKG is not ordered today.   Recent Labs: No results found for requested labs within last 8760 hours.   Lipid Panel No results found for: CHOL, TRIG, HDL,  CHOLHDL, VLDL, LDLCALC, LDLDIRECT  Additional studies/ records that were reviewed today include:  Lexiscan 03/08/2018 Study Highlights      Nuclear stress EF: 52%.  There was no ST segment deviation noted during stress.  The study is normal.  This is a low risk study.  The left ventricular ejection  fraction is mildly decreased (45-54%).   Normal pharmacologic nuclear stress test with no evidence for prior infarct or ischemia.      2D echo 3/12/2019Study Conclusions   - Left ventricle: The cavity size was normal. There was mild focal   basal hypertrophy of the septum. Systolic function was mildly   reduced. The estimated ejection fraction was in the range of 45%   to 50%. Diffuse hypokinesis. Doppler parameters are consistent   with abnormal left ventricular relaxation (grade 1 diastolic   dysfunction). - Aortic valve: There was trivial regurgitation. - Aortic root: The aortic root was mildly dilated. - Mitral valve: Calcified annulus. - Left atrium: The atrium was moderately dilated.   Impressions:   - Mild global reduction in LV systolic function; mild diastolic   dysfunction; mildly dilated aortic root; trace AI; moderate LAE.   30-day monitor 5/2/2019Study Highlights    Sinus rhythm with average heart rate 64 bpm.  Sinus tachycardia up to 121 bpm.  Occasional PVCs and ventricular couplets.        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 Orthostatic hypotension   3. Dyslipidemia   4. PVC's (premature ventricular contractions)      PLAN:  In order of problems listed above:  Hypertensive heart disease blood pressure still fluctuates with higher readings in the morning when he gets up.  He is getting quite a bit of salt in his diet.  Once again have given him a 2 g sodium diet to follow.  Reluctant to add any more medications because of his history of orthostatic hypotension.  Orthostatic hypotension has  resolved  Dyslipidemia on Pravachol  OSA on CPAP followed by Dr. Radford Pax  PVCs on 30-day monitor.  Dr. Radford Pax reviewed his monitor and recommends a 24-hour Holter for PVC load.  Pending these results follow-up with her in 6 months.  Medication Adjustments/Labs and Tests Ordered: Current medicines are reviewed at length with the patient today.  Concerns regarding medicines are outlined above.  Medication changes, Labs and Tests ordered today are listed in the Patient Instructions below. Patient Instructions  Medication Instructions:  Your physician recommends that you continue on your current medications as directed. Please refer to the Current Medication list given to you today.   Labwork: none  Testing/Procedures: Your physician has recommended that you wear a holter monitor. Holter monitors are medical devices that record the heart's electrical activity. Doctors most often use these monitors to diagnose arrhythmias. Arrhythmias are problems with the speed or rhythm of the heartbeat. The monitor is a small, portable device. You can wear one while you do your normal daily activities. This is usually used to diagnose what is causing palpitations/syncope (passing out).    Follow-Up: Your physician wants you to follow-up in: 6 months with Dr. Radford Pax.  You will receive a reminder letter in the mail two months in advance. If you don't receive a letter, please call our office to schedule the follow-up appointment.  Low-Sodium Eating Plan Sodium, which is an element that makes up salt, helps you maintain a healthy balance of fluids in your body. Too much sodium can increase your blood pressure and cause fluid and waste to be held in your body. Your health care provider or dietitian may recommend following this plan if you have high blood pressure (hypertension), kidney disease, liver disease, or heart failure. Eating less sodium can help lower your blood pressure, reduce swelling, and protect  your heart, liver,  and kidneys. What are tips for following this plan? General guidelines  Most people on this plan should limit their sodium intake to 1,500-2,000 mg (milligrams) of sodium each day. Reading food labels  The Nutrition Facts label lists the amount of sodium in one serving of the food. If you eat more than one serving, you must multiply the listed amount of sodium by the number of servings.  Choose foods with less than 140 mg of sodium per serving.  Avoid foods with 300 mg of sodium or more per serving. Shopping  Look for lower-sodium products, often labeled as "low-sodium" or "no salt added."  Always check the sodium content even if foods are labeled as "unsalted" or "no salt added".  Buy fresh foods. ? Avoid canned foods and premade or frozen meals. ? Avoid canned, cured, or processed meats  Buy breads that have less than 80 mg of sodium per slice. Cooking  Eat more home-cooked food and less restaurant, buffet, and fast food.  Avoid adding salt when cooking. Use salt-free seasonings or herbs instead of table salt or sea salt. Check with your health care provider or pharmacist before using salt substitutes.  Cook with plant-based oils, such as canola, sunflower, or olive oil. Meal planning  When eating at a restaurant, ask that your food be prepared with less salt or no salt, if possible.  Avoid foods that contain MSG (monosodium glutamate). MSG is sometimes added to [place] food, bouillon, and some canned foods. What foods are recommended? The items listed may not be a complete list. Talk with your dietitian about what dietary choices are best for you. Grains Low-sodium cereals, including oats, puffed wheat and rice, and shredded wheat. Low-sodium crackers. Unsalted rice. Unsalted pasta. Low-sodium bread. Whole-grain breads and whole-grain pasta. Vegetables Fresh or frozen vegetables. "No salt added" canned vegetables. "No salt added" tomato sauce and paste.  Low-sodium or reduced-sodium tomato and vegetable juice. Fruits Fresh, frozen, or canned fruit. Fruit juice. Meats and other protein foods Fresh or frozen (no salt added) meat, poultry, seafood, and fish. Low-sodium canned tuna and salmon. Unsalted nuts. Dried peas, beans, and lentils without added salt. Unsalted canned beans. Eggs. Unsalted nut butters. Dairy Milk. Soy milk. Cheese that is naturally low in sodium, such as ricotta cheese, fresh mozzarella, or Swiss cheese Low-sodium or reduced-sodium cheese. Cream cheese. Yogurt. Fats and oils Unsalted butter. Unsalted margarine with no trans fat. Vegetable oils such as canola or olive oils. Seasonings and other foods Fresh and dried herbs and spices. Salt-free seasonings. Low-sodium mustard and ketchup. Sodium-free salad dressing. Sodium-free light mayonnaise. Fresh or refrigerated horseradish. Lemon juice. Vinegar. Homemade, reduced-sodium, or low-sodium soups. Unsalted popcorn and pretzels. Low-salt or salt-free chips. What foods are not recommended? The items listed may not be a complete list. Talk with your dietitian about what dietary choices are best for you. Grains Instant hot cereals. Bread stuffing, pancake, and biscuit mixes. Croutons. Seasoned rice or pasta mixes. Noodle soup cups. Boxed or frozen macaroni and cheese. Regular salted crackers. Self-rising flour. Vegetables Sauerkraut, pickled vegetables, and relishes. Olives. [place] fries. Onion rings. Regular canned vegetables (not low-sodium or reduced-sodium). Regular canned tomato sauce and paste (not low-sodium or reduced-sodium). Regular tomato and vegetable juice (not low-sodium or reduced-sodium). Frozen vegetables in sauces. Meats and other protein foods Meat or fish that is salted, canned, smoked, spiced, or pickled. Bacon, ham, sausage, hotdogs, corned beef, chipped beef, packaged lunch meats, salt pork, jerky, pickled herring, anchovies, regular canned tuna, sardines, salted  nuts.  Dairy Processed cheese and cheese spreads. Cheese curds. Blue cheese. Feta cheese. String cheese. Regular cottage cheese. Buttermilk. Canned milk. Fats and oils Salted butter. Regular margarine. Ghee. Bacon fat. Seasonings and other foods Onion salt, garlic salt, seasoned salt, table salt, and sea salt. Canned and packaged gravies. Worcestershire sauce. Tartar sauce. Barbecue sauce. Teriyaki sauce. Soy sauce, including reduced-sodium. Steak sauce. Fish sauce. Oyster sauce. Cocktail sauce. Horseradish that you find on the shelf. Regular ketchup and mustard. Meat flavorings and tenderizers. Bouillon cubes. Hot sauce and Tabasco sauce. Premade or packaged marinades. Premade or packaged taco seasonings. Relishes. Regular salad dressings. Salsa. Potato and tortilla chips. Corn chips and puffs. Salted popcorn and pretzels. Canned or dried soups. Pizza. Frozen entrees and pot pies. Summary  Eating less sodium can help lower your blood pressure, reduce swelling, and protect your heart, liver, and kidneys.  Most people on this plan should limit their sodium intake to 1,500-2,000 mg (milligrams) of sodium each day.  Canned, boxed, and frozen foods are high in sodium. Restaurant foods, fast foods, and pizza are also very high in sodium. You also get sodium by adding salt to food.  Try to cook at home, eat more fresh fruits and vegetables, and eat less fast food, canned, processed, or prepared foods. This information is not intended to replace advice given to you by your health care provider. Make sure you discuss any questions you have with your health care provider. Document Released: 05/22/2002 Document Revised: 11/23/2016 Document Reviewed: 11/23/2016 Elsevier Interactive Patient Education  2018 [place] will receive a reminder letter in the mail two months in advance. If you don't receive a letter, please call our office to schedule the follow-up appointment.   Any Other Special  Instructions Will Be Listed Below (If Applicable).     If you need a refill on your cardiac medications before your next appointment, please call your pharmacy.      Signed, Ermalinda Barrios, PA-C  05/18/2018 1:58 PM    [facility] [address], [place], [place]  [address] Phone: [phone]; Fax: [phone]

## 2018-05-20 ENCOUNTER — Ambulatory Visit (INDEPENDENT_AMBULATORY_CARE_PROVIDER_SITE_OTHER): Payer: Medicare Other

## 2018-05-20 DIAGNOSIS — I493 Ventricular premature depolarization: Secondary | ICD-10-CM | POA: Diagnosis not present

## 2018-06-23 ENCOUNTER — Encounter: Payer: Self-pay | Admitting: Cardiology

## 2018-06-24 ENCOUNTER — Ambulatory Visit (INDEPENDENT_AMBULATORY_CARE_PROVIDER_SITE_OTHER): Payer: Medicare Other | Admitting: Cardiology

## 2018-06-24 ENCOUNTER — Other Ambulatory Visit: Payer: Self-pay | Admitting: Cardiology

## 2018-06-24 ENCOUNTER — Encounter: Payer: Self-pay | Admitting: Cardiology

## 2018-06-24 VITALS — BP 114/64 | HR 72 | Ht 70.0 in | Wt 183.0 lb

## 2018-06-24 DIAGNOSIS — I493 Ventricular premature depolarization: Secondary | ICD-10-CM

## 2018-06-24 DIAGNOSIS — G4733 Obstructive sleep apnea (adult) (pediatric): Secondary | ICD-10-CM

## 2018-06-24 DIAGNOSIS — I951 Orthostatic hypotension: Secondary | ICD-10-CM | POA: Diagnosis not present

## 2018-06-24 DIAGNOSIS — Z9989 Dependence on other enabling machines and devices: Secondary | ICD-10-CM

## 2018-06-24 DIAGNOSIS — I119 Hypertensive heart disease without heart failure: Secondary | ICD-10-CM

## 2018-06-24 MED ORDER — LISINOPRIL 40 MG PO TABS: 40.0000 mg | ORAL_TABLET | Freq: Every evening | ORAL | 0 refills | Status: DC

## 2018-06-24 MED ORDER — CLONIDINE HCL 0.2 MG PO TABS: 0.2000 mg | ORAL_TABLET | Freq: Two times a day (BID) | ORAL | 3 refills | Status: DC

## 2018-06-24 MED ORDER — CHLORTHALIDONE 25 MG PO TABS: 25.0000 mg | ORAL_TABLET | Freq: Every day | ORAL | 0 refills | Status: DC

## 2018-06-24 MED ORDER — CLONIDINE HCL 0.2 MG PO TABS: 0.2000 mg | ORAL_TABLET | Freq: Every evening | ORAL | 0 refills | Status: DC

## 2018-06-24 NOTE — Telephone Encounter (Signed)
Patient was started on this medication at todays office visit. It was sent in for #30 with no refills, request is for #90. Okay to change? Please advise. Thanks, MI

## 2018-06-24 NOTE — Patient Instructions (Addendum)
Medication Instructions:  Your physician has recommended you make the following change in your medication:  STOP: Lasix  DECREASE: Lisinopril 40 mg in the evening  INCREASE: Clonidine 0.2 mg once a night  START: Chlorthalidone 25 mg once a day   If you need a refill on your cardiac medications, please contact your pharmacy first.  Labwork: None ordered   Testing/Procedures: Your physician has requested that you have a renal artery duplex. During this test, an ultrasound is used to evaluate blood flow to the kidneys. Allow one hour for this exam. Do not eat after midnight the day before and avoid carbonated beverages. Take your medications as you usually do.  Follow-Up: Your physician recommends that you schedule a follow-up appointment in: 1 week at the hypertension clinic    Your physician recommends that you schedule a follow-up appointment in: 3 months with Estella Husk, PA   Your physician wants you to follow-up in: 6 months with Dr. Radford Pax. You will receive a reminder letter in the mail two months in advance. If you don't receive a letter, please call our office to schedule the follow-up appointment.  Any Other Special Instructions Will Be Listed Below (If Applicable). Your physician recommends that you check your blood pressure once daily for the next week and bring blood pressure readings to your next visit.   Thank you for choosing [facility], RN  [phone]  If you need a refill on your cardiac medications before your next appointment, please call your pharmacy.

## 2018-06-24 NOTE — Progress Notes (Signed)
Cardiology Office Note:    Date:  06/24/2018   ID:  Marcus King, DOB [birth year], MRN [id number]  PCP:  Rochel Brome, MD  Cardiologist:  Fransico Him, MD    Referring MD: Rochel Brome, MD   Chief Complaint  Patient presents with  . Follow-up    PVCs, hypertension, orthostatic hypotension with syncope    History of Present Illness:    Marcus King is a 77 y.o. male with a hx of HTN, anxiety, syncope secondary to orthostasis from dehydration and OSA on CPAP.  He was seen by Estella Husk, PA 02/15/2018 for followup of orthostatic hypotension and HTN.  He had not had any further dizzy spells. Unfortunately he had been eating fast food quite a bit and was instructed to follow a low sodium diet.  His LE edema has improved with compression hose.  2D echo showed mild LV dysfunction with EF 45-50% with diffuse HK and mildly dilated aortic root.  Nuclear stress test showed no ischemia and EF 52%.    At his last office visit he was complaining of slow heartbeats but it was suspected that he was having intermittent PVCs.  Holter monitor showed sinus bradycardia, normal sinus rhythm and sinus tachycardia with an average heart rate of 70 bpm.  His heart rate ranged from 55 to 115 bpm.  There were occasional PVCs, ventricular couplets and triplets as well as bigeminal PVCs with a PVC load of 2%.  He is seeing [name] and his primary care physician since I saw him last in his been placed back on his old blood pressure medicines including lisinopril 40 mg twice daily, amlodipine 10 mg daily and Lasix 20 mill grams twice daily.  He is here today for followup and is doing well.  He denies any chest pain or pressure, SOB, DOE, PND, orthopnea, LE edema, dizziness, palpitations or syncope. He is compliant with his meds and is tolerating meds with no SE still having problems with wide fluctuations in his blood pressure.  During the day says his blood pressure is fine but he gets up to urinate several times at  night and each time he goes to urinate he checks his blood pressure and if it is high then he continues to keep checking it throughout the night.  He gets systolic readings of 474 mmHg at night but during the day they are around 110 to 150 mmHg.  He has not had any dizzy spells or syncope.  He is very frustrated and scared he is going to have a stroke.  He is still having a lot of anxiety and is been treated by his psychologist who recently put him on Ritalin which he says was for anxiety.  He is also on Paxil.  He continues to use some salt in his diet including eating hamburgers and [place] fries during the week.  Past Medical History:  Diagnosis Date  . Anxiety   . Chronic pain of left knee 08/11/2016  . Food poisoning   . Headache    sinus  . Hypertension   . OSA on CPAP   . Prostate cancer ([place])   . 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 CARDIAC CATHETERIZATION    . CATARACT EXTRACTION W/ INTRAOCULAR LENS  IMPLANT, BILATERAL Bilateral   . COLON SURGERY  2014  . COLOSTOMY REVERSAL  2014  . HERNIA REPAIR     x2  . JOINT REPLACEMENT    .  PROSTATE SURGERY  2006  . SEPTOPLASTY    . TOTAL KNEE ARTHROPLASTY Left 09/28/2016   Procedure: TOTAL KNEE ARTHROPLASTY;  Surgeon: [name], MD;  Location: [facility];  Service: Orthopedics;  Laterality: Left;    Current Medications: Current Meds  Medication Sig  . amLODipine (NORVASC) 10 MG tablet Take 1 tablet by mouth daily.  . busPIRone (BUSPAR) 15 MG tablet Take 1 tablet by mouth 2 (two) times daily.  . carvedilol (COREG) 25 MG tablet Take 25 mg by mouth 2 (two) times daily.   . cloNIDine (CATAPRES) 0.1 MG tablet Take 0.1 mg by mouth daily.  . furosemide (LASIX) 20 MG tablet Take 20 mg by mouth 2 (two) times daily.   [name] lisinopril (PRINIVIL,ZESTRIL) 40 MG tablet Take 40 mg by mouth 2 (two) times daily.  . methylphenidate (RITALIN) 10 MG tablet Take 0.5 tablets by  mouth 2 (two) times daily.  [name] PARoxetine (PAXIL) 40 MG tablet Take 40 mg by mouth daily.   . pravastatin (PRAVACHOL) 10 MG tablet Take 10 mg by mouth daily.     Allergies:   Beef-derived products; Clonazepam; Monosodium glutamate; and Pegademase bovine   Social History   Socioeconomic History  . Marital status: Married    Spouse name: Not on file  . Number of children: 2  . Years of education: Not on file  . Highest education level: High school graduate  Occupational History  . Not on file  Social Needs  . Financial resource strain: Not on file  . Food insecurity:    Worry: Not on file    Inability: Not on file  . Transportation needs:    Medical: Not on file    Non-medical: Not on file  Tobacco Use  . Smoking status: Former Smoker    Years: 20.00    Types: Cigarettes    Last attempt to quit: 1979    Years since quitting: 40.5  . Smokeless tobacco: Never Used  . Tobacco comment: quit 37 years ago  Substance and Sexual Activity  . Alcohol use: No  . Drug use: No  . Sexual activity: Not on file  Lifestyle  . Physical activity:    Days per week: Not on file    Minutes per session: Not on file  . Stress: Not on file  Relationships  . Social connections:    Talks on phone: Not on file    Gets together: Not on file    Attends religious service: Not on file    Active member of club or organization: Not on file    Attends meetings of clubs or organizations: Not on file    Relationship status: Not on file  Other Topics Concern  . Not on file  Social History Narrative   LIves at home with his wife   Self employed   Right handed   Drinks about 2 cups of caffeine daily     Family History: The patient's family history includes Leukemia in his sister; Lung disease in his father; Stroke in his father and mother. There is no history of Alzheimer's disease.  ROS:   Please see the history of present illness.    ROS  All other systems reviewed and negative.    EKGs/Labs/Other Studies Reviewed:    The following studies were reviewed today: none  EKG:  EKG is not ordered today.    Recent Labs: No results found for requested labs within last 8760 hours.   Recent Lipid Panel No results found  for: CHOL, TRIG, HDL, CHOLHDL, VLDL, LDLCALC, LDLDIRECT  Physical Exam:    VS:  BP 114/64   Pulse 72   Ht 5\' 10"  (1.778 m)   Wt 183 lb (83 kg)   SpO2 96%   BMI 26.26 kg/m     Wt Readings from Last 3 Encounters:  06/24/18 183 lb (83 kg)  05/18/18 183 lb (83 kg)  04/14/18 183 lb (83 kg)     GEN:  Well nourished, well developed in no acute distress HEENT: Normal NECK: No JVD; No carotid bruits LYMPHATICS: No lymphadenopathy CARDIAC: RRR, no murmurs, rubs, gallops RESPIRATORY:  Clear to auscultation without rales, wheezing or rhonchi  ABDOMEN: Soft, non-tender, non-distended MUSCULOSKELETAL:  No edema; No deformity  SKIN: Warm and dry NEUROLOGIC:  Alert and oriented x 3 PSYCHIATRIC:  Normal affect   ASSESSMENT:    1. PVC's (premature ventricular contractions)   2. Hypertensive heart disease without heart failure   3. Orthostatic hypotension   4. OSA on CPAP    PLAN:    In order of problems listed above:  1.  PVCs - Holter monitor was done because patient says his heart rate was in the low 40s on his pulse.  I suspect at the last office visit that it was likely PVCs.  Holter monitor confirmed normal sinus rhythm with an average heart rate of 70 bpm with a range of 55 to 115 bpm with occasional PVCs, bigeminal PVCs and ventricular couplets.  PVC load was low at 2%.  2.  Hypertension - blood pressure is well controlled on exam today but he is having elevated readings at night and early in the morning.  I recommended we decrease lisinopril to 40 mg in the evening as I do not think we will get much benefit to 80 mg a day.  I am going to stop his Lasix and start him on chlorthalidone 25 mg daily.  He will continue on amlodipine 10 mg daily  as well as carvedilol 25 mg twice daily.  I am going to increase his clonidine to 0.2 mg .  I have asked him to check his blood pressure daily for a week and I will set him up to be seen in our hypertension clinic in 1 week.  Also going to get a renal duplex to rule out renal artery stenosis.  I think a lot of his hypertension at night is driven by anxiety.  He checks his blood pressure multiple times at night when he gets up to go the bathroom and I have asked him to stop doing this as I think his blood pressure trends upward when he gets anxious thinking about if his blood pressure is going to be elevated.   3.  Orthostatic hypotension - this is resolved he has had no further presyncopal or syncopal episodes.  4. OSA - the patient is tolerating PAP therapy well without any problems.    Medication Adjustments/Labs and Tests Ordered: Current medicines are reviewed at length with the patient today.  Concerns regarding medicines are outlined above.  No orders of the defined types were placed in this encounter.  No orders of the defined types were placed in this encounter.   Signed, Fransico Him, MD  06/24/2018 2:24 PM    [place]

## 2018-06-26 ENCOUNTER — Other Ambulatory Visit: Payer: Self-pay | Admitting: Cardiology

## 2018-06-30 ENCOUNTER — Ambulatory Visit (HOSPITAL_COMMUNITY)
Admission: RE | Admit: 2018-06-30 | Discharge: 2018-06-30 | Disposition: A | Discharge status: Home / Self Care | Payer: Medicare Other | Source: Ambulatory Visit | Attending: Cardiology | Admitting: Cardiology

## 2018-06-30 DIAGNOSIS — I119 Hypertensive heart disease without heart failure: Secondary | ICD-10-CM

## 2018-07-04 DIAGNOSIS — L578 Other skin changes due to chronic exposure to nonionizing radiation: Secondary | ICD-10-CM | POA: Diagnosis not present

## 2018-07-04 DIAGNOSIS — L821 Other seborrheic keratosis: Secondary | ICD-10-CM | POA: Diagnosis not present

## 2018-07-04 DIAGNOSIS — L570 Actinic keratosis: Secondary | ICD-10-CM | POA: Diagnosis not present

## 2018-07-04 DIAGNOSIS — C44629 Squamous cell carcinoma of skin of left upper limb, including shoulder: Secondary | ICD-10-CM | POA: Diagnosis not present

## 2018-07-12 ENCOUNTER — Ambulatory Visit (INDEPENDENT_AMBULATORY_CARE_PROVIDER_SITE_OTHER): Payer: Medicare Other | Admitting: Pharmacist

## 2018-07-12 VITALS — BP 122/72 | HR 71

## 2018-07-12 DIAGNOSIS — I951 Orthostatic hypotension: Secondary | ICD-10-CM | POA: Diagnosis not present

## 2018-07-12 DIAGNOSIS — I119 Hypertensive heart disease without heart failure: Secondary | ICD-10-CM | POA: Diagnosis not present

## 2018-07-12 NOTE — Patient Instructions (Addendum)
Return for a follow up appointment in 3-4 weeks [phone])   Check your blood pressure at home daily (if able) and keep record of the readings.  Take your BP meds as follows: DECREASE clonidine to 0.1mg  each evening  Bring all of your meds, your BP cuff and your record of home blood pressures to your next appointment.  Exercise as you're able, try to walk approximately 30 minutes per day.  Keep salt intake to a minimum, especially watch canned and prepared boxed foods.  Eat more fresh fruits and vegetables and fewer canned items.  Avoid eating in fast food restaurants.    HOW TO TAKE YOUR BLOOD PRESSURE: . Rest 5 minutes before taking your blood pressure. .  Don't smoke or drink caffeinated beverages for at least 30 minutes before. . Take your blood pressure before (not after) you eat. . Sit comfortably with your back supported and both feet on the floor (don't cross your legs). . Elevate your arm to heart level on a table or a desk. . Use the proper sized cuff. It should fit smoothly and snugly around your bare upper arm. There should be enough room to slip a fingertip under the cuff. The bottom edge of the cuff should be 1 inch above the crease of the elbow. . Ideally, take 3 measurements at one sitting and record the average.

## 2018-07-12 NOTE — Progress Notes (Signed)
Patient ID: Marcus King                 DOB: March 30, 1941                      MRN: [id number]     HPI: Marcus King is a 77 y.o. male patient of Dr.Turner who presents today for hypertension evaluation. PMH significant for HTN, anxiety, syncope secondary to orthostasis from dehydrationand OSA on CPAP. He has a history of orthostatic hypotension. There were occasional PVCs, ventricular couplets and triplets as well as bigeminal PVCs with a PVC load of 2%. Of note he is on ritalin. At his most recent visit, his lisinopril was decreased to 40mg  due to lack of benefit with 80mg  dose. He was started on chlorthalidone and his clonidine was increased.   He presents today for blood pressure management with his wife. He reports that he has had some dizziness particularly in the morning and feeling "drunk." He believes that the chlorthalidone has worked extremely well for him (possibly even too well). He, his children and his wife are very involved with his care since he has previously been overmedicated and had issues with orthostatic hypotension.    Current HTN meds:  Amlodipine 10mg  daily - has not been taking for 2 months.  Carvedilol 25mg  BID Chlorthalidone 25mg  daily Clonidine 0.2mg  each evening Lisinopril 40mg  daily in the evening  BP goal: <130/80  Family History: Leukemia in his sister; Lung disease in his father; Stroke in his father and mother. There is no history of Alzheimer's disease.  Social History: former smoker, denies alcohol  Diet: Eat out and from home. He does endorse eating fast food when he eats out. He eats mostly meat and vegetables. He eats fruits vegetables and salads from home. He does use salt substitute at home. He does not add salt when eating out.   Exercise: He has not been regularly exercising, but does do yard work. He is very active around the home.   Home BP readings: mostly 110-130/75-85 per his report - highest he recalls is 155/87 and lowest is 87/50  Wt  Readings from Last 3 Encounters:  06/24/18 183 lb (83 kg)  05/18/18 183 lb (83 kg)  04/14/18 183 lb (83 kg)   BP Readings from Last 3 Encounters:  07/12/18 122/72  06/24/18 114/64  05/18/18 (!) 110/48   Pulse Readings from Last 3 Encounters:  07/12/18 71  06/24/18 72  05/18/18 73    Renal function: CrCl cannot be calculated (Unknown ideal weight.).  Past Medical History:  Diagnosis Date  . Anxiety   . Chronic pain of left knee 08/11/2016  . Food poisoning   . Headache    sinus  . Hypertension   . OSA on CPAP   . Prostate cancer ([place])   . 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 busPIRone (BUSPAR) 15 MG tablet Take 1 tablet by mouth 2 (two) times daily.  0  . carvedilol (COREG) 25 MG tablet Take 25 mg by mouth 2 (two) times daily.     . chlorthalidone (HYGROTON) 25 MG tablet TAKE 1 TABLET(25 MG) BY MOUTH DAILY 90 tablet 0  . cloNIDine (CATAPRES) 0.1 MG tablet Take 1 tablet (0.1 mg total) by mouth every evening.    [name] lisinopril (PRINIVIL,ZESTRIL) 40 MG tablet Take 1 tablet (40 mg total) by mouth  every evening. 30 tablet 0  . PARoxetine (PAXIL) 40 MG tablet Take 40 mg by mouth daily.     . pravastatin (PRAVACHOL) 10 MG tablet Take 10 mg by mouth daily.     No current facility-administered medications on file prior to visit.     Allergies  Allergen Reactions  . Beef-Derived Products Diarrhea  . Clonazepam Other (See Comments)    High doses cause drunk feeling (>1 mg)  . Monosodium Glutamate Other (See Comments)    MSG (soups and oriental foods)  . Pegademase Bovine Diarrhea    Blood pressure 122/72, pulse 71, SpO2 98 %.   Assessment/Plan: Hypertension: BMET today. BP today at goal. Will decrease clonidine dose to 0.1mg  each evening due to low pressures and "drunk" feeling. Will plan to decrease chlorthalidone if these symptoms or low pressures  continue. Advised to continue to monitor pressures and symptoms. Follow up in 3-4 weeks and call sooner if symptoms worsen.   Thank you, Lelan Pons. Patterson Hammersmith, [facility]  07/13/2018 8:45 AM   ADDENDUM: BMET returned with very slightly increased BUN and all other labs WNL. Will continue as above and repeat panel in 3-4 weeks with HTN follow up.

## 2018-07-13 ENCOUNTER — Other Ambulatory Visit: Payer: Self-pay

## 2018-07-13 ENCOUNTER — Encounter: Payer: Self-pay | Admitting: Pharmacist

## 2018-07-13 LAB — BASIC METABOLIC PANEL
BUN / CREAT RATIO: 22 (ref 10–24)
BUN: 28 mg/dL — AB (ref 8–27)
CO2: 22 mmol/L (ref 20–29)
CREATININE: 1.27 mg/dL (ref 0.76–1.27)
Calcium: 9.6 mg/dL (ref 8.6–10.2)
Chloride: 103 mmol/L (ref 96–106)
GFR calc Af Amer: 62 mL/min/{1.73_m2} (ref >59)
GFR, EST NON AFRICAN AMERICAN: 54 mL/min/{1.73_m2} — AB (ref >59)
GLUCOSE: 78 mg/dL (ref 65–99)
Potassium: 4.6 mmol/L (ref 3.5–5.2)
SODIUM: 143 mmol/L (ref 134–144)

## 2018-08-09 ENCOUNTER — Ambulatory Visit: Payer: Medicare Other

## 2018-08-29 ENCOUNTER — Ambulatory Visit: Payer: Medicare Other

## 2018-09-15 DIAGNOSIS — E782 Mixed hyperlipidemia: Secondary | ICD-10-CM | POA: Diagnosis not present

## 2018-09-15 DIAGNOSIS — I10 Essential (primary) hypertension: Secondary | ICD-10-CM | POA: Diagnosis not present

## 2018-09-15 DIAGNOSIS — F411 Generalized anxiety disorder: Secondary | ICD-10-CM | POA: Diagnosis not present

## 2018-09-15 DIAGNOSIS — Z23 Encounter for immunization: Secondary | ICD-10-CM | POA: Diagnosis not present

## 2018-09-15 DIAGNOSIS — I110 Hypertensive heart disease with heart failure: Secondary | ICD-10-CM | POA: Diagnosis not present

## 2018-09-21 ENCOUNTER — Encounter: Payer: Self-pay | Admitting: Cardiology

## 2018-09-21 ENCOUNTER — Ambulatory Visit (INDEPENDENT_AMBULATORY_CARE_PROVIDER_SITE_OTHER): Payer: Medicare Other | Admitting: Cardiology

## 2018-09-21 VITALS — BP 108/48 | HR 42 | Ht 70.0 in | Wt 188.8 lb

## 2018-09-21 DIAGNOSIS — I5030 Unspecified diastolic (congestive) heart failure: Secondary | ICD-10-CM

## 2018-09-21 DIAGNOSIS — I493 Ventricular premature depolarization: Secondary | ICD-10-CM | POA: Diagnosis not present

## 2018-09-21 DIAGNOSIS — I119 Hypertensive heart disease without heart failure: Secondary | ICD-10-CM

## 2018-09-21 DIAGNOSIS — E663 Overweight: Secondary | ICD-10-CM | POA: Diagnosis not present

## 2018-09-21 DIAGNOSIS — Z0000 Encounter for general adult medical examination without abnormal findings: Secondary | ICD-10-CM | POA: Diagnosis not present

## 2018-09-21 DIAGNOSIS — Z6827 Body mass index (BMI) 27.0-27.9, adult: Secondary | ICD-10-CM | POA: Diagnosis not present

## 2018-09-21 HISTORY — DX: Unspecified diastolic (congestive) heart failure: I50.30

## 2018-09-21 MED ORDER — CHLORTHALIDONE 25 MG PO TABS: ORAL_TABLET | ORAL | 0 refills | Status: DC

## 2018-09-21 NOTE — Patient Instructions (Addendum)
Medication Instructions:  STOP: Clonidine   DECREASE: Chlorthalidone to 12.5 mg daily ( Do not take if BP is <110 )  If you need a refill on your cardiac medications before your next appointment, please call your pharmacy.   Lab work: None  If you have labs (blood work) drawn today and your tests are completely normal, you will receive your results only by: [name] MyChart Message (if you have MyChart) OR . A paper copy in the mail If you have any lab test that is abnormal or we need to change your treatment, we will call you to review the results.  Testing/Procedures: None   Follow-Up: At [facility], you and your health needs are our priority.  As part of our continuing mission to provide you with exceptional heart care, we have created designated Provider Care Teams.  These Care Teams include your primary Cardiologist (physician) and Advanced Practice Providers (APPs -  Physician Assistants and Nurse Practitioners) who all work together to provide you with the care you need, when you need it. You will need a follow up appointment in 3-4 months. You may see Fransico Him, MD or one of the following Advanced Practice Providers on your designated Care Team:   [place], PA-C Melina Copa, PA-C . Ermalinda Barrios, PA-C  FOLLOW UP WITH NINA HAMMOND, NP IN 1 MONTH   YOUR PHYSICIAN HAD REQUESTED THAT YOU SEE SOMEONE IN OUR HYPERTENSION CLINIC IN 2 WEEKS   REMEMBER TO STAY HYDRATED   Any Other Special Instructions Will Be Listed Below (If Applicable). '

## 2018-09-21 NOTE — Progress Notes (Addendum)
Cardiology Office Note:    Date:  09/21/2018   ID:  Marcus King, DOB [birth year], MRN [id number]  PCP:  Rochel Brome, MD  Cardiologist:  Fransico Him, MD  Referring MD: Rochel Brome, MD   Chief Complaint  Patient presents with  . Follow-up    Hypertension    History of Present Illness:    Marcus King is a 77 y.o. male with a past medical history significant for HTN, anxiety, syncope secondary to orthostasis from dehydrationand OSA on CPAP. He has had trouble controlling BP without getting it too low.   He started chlortalidone in July but his BP was getting too low 80's-90's. The patient was washed out and unable to do any activity.  He stopped it for about a month. He felt better but his BP started creeping up so he restarted chlorthalidone 12.5 mg BID for about the last 2 weeks. BP's have been better 120's-140's with occasional lows 90's/50's. He feels washed out especially in the morning. He feels that the clonidine is still making him feel drunk even on the lower dose. Over the weekend his HR went up to 108. His wife says that he has not been drinking water like he has been told to.  No syncope lately.   No chest discomfort or edema. Heart rates have been in 40's-50's op home BP monitor.   30 day monitor in may-June showed average HR 64, up to max 121. Occ PVCs and ventricular couplets. Holter monitor in June showed HR 55-115 with average 70 bpm. Occ PVCs, ventricular couplets, triplets and bigeminy with PVC load of 2%.   Pt brought in labs done today at his PCP.  SCr 1.37 BUN 35 Potassium 4.3 LDL 71 TSH 1.100 Hgb 14.4  Past Medical History:  Diagnosis Date  . Anxiety   . Chronic pain of left knee 08/11/2016  . Food poisoning   . Headache    sinus  . Hypertension   . OSA on CPAP   . Prostate cancer ([place])   . 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 CARDIAC CATHETERIZATION    . CATARACT  EXTRACTION W/ INTRAOCULAR LENS  IMPLANT, BILATERAL Bilateral   . COLON SURGERY  2014  . COLOSTOMY REVERSAL  2014  . HERNIA REPAIR     x2  . JOINT REPLACEMENT    . PROSTATE SURGERY  2006  . SEPTOPLASTY    . TOTAL KNEE ARTHROPLASTY Left 09/28/2016   Procedure: TOTAL KNEE ARTHROPLASTY;  Surgeon: [name], MD;  Location: [facility];  Service: Orthopedics;  Laterality: Left;    Current Medications: Current Meds  Medication Sig  . busPIRone (BUSPAR) 15 MG tablet Take 1 tablet by mouth 2 (two) times daily.  . chlorthalidone (HYGROTON) 25 MG tablet Take 12.5 mg daily  . hydrOXYzine (ATARAX/VISTARIL) 25 MG tablet Take 1 tablet by mouth as directed.  [name] lisinopril (PRINIVIL,ZESTRIL) 40 MG tablet Take 1 tablet (40 mg total) by mouth every evening.  [name] PARoxetine (PAXIL) 40 MG tablet Take 40 mg by mouth daily.   . pravastatin (PRAVACHOL) 10 MG tablet Take 10 mg by mouth daily.  . [DISCONTINUED] chlorthalidone (HYGROTON) 25 MG tablet TAKE 1 TABLET(25 MG) BY MOUTH DAILY  . [DISCONTINUED] cloNIDine (CATAPRES) 0.1 MG tablet Take 1 tablet (0.1 mg total) by mouth every evening.     Allergies:   Beef-derived products; Clonazepam; Monosodium glutamate; and Pegademase bovine   Social History  Socioeconomic History  . Marital status: Married    Spouse name: Not on file  . Number of children: 2  . Years of education: Not on file  . Highest education level: High school graduate  Occupational History  . Not on file  Social Needs  . Financial resource strain: Not on file  . Food insecurity:    Worry: Not on file    Inability: Not on file  . Transportation needs:    Medical: Not on file    Non-medical: Not on file  Tobacco Use  . Smoking status: Former Smoker    Years: 20.00    Types: Cigarettes    Last attempt to quit: 1979    Years since quitting: 40.7  . Smokeless tobacco: Never Used  . Tobacco comment: quit 37 years ago  Substance and Sexual Activity  . Alcohol use: No  . Drug use: No    . Sexual activity: Not on file  Lifestyle  . Physical activity:    Days per week: Not on file    Minutes per session: Not on file  . Stress: Not on file  Relationships  . Social connections:    Talks on phone: Not on file    Gets together: Not on file    Attends religious service: Not on file    Active member of club or organization: Not on file    Attends meetings of clubs or organizations: Not on file    Relationship status: Not on file  Other Topics Concern  . Not on file  Social History Narrative   LIves at home with his wife   Self employed   Right handed   Drinks about 2 cups of caffeine daily     Family History: The patient's family history includes Leukemia in his sister; Lung disease in his father; Stroke in his father and mother. There is no history of Alzheimer's disease. ROS:   Please see the history of present illness.     All other systems reviewed and are negative.  EKGs/Labs/Other Studies Reviewed:    The following studies were reviewed today:  24 hour holter monitor 05/20/18  Sinus bradycardia, normal sinus rhythm sinus tachycardia with average heart rate 70 bpm. The heart rate ranged from 55 to 115 bpm.  Occasional PACs and atrial couplets  Occasional PVCs, ventricular couplets and triplets and bigeminal PVCs with PVC load at 2%   30 day event monitor 05/18/18  Sinus rhythm with average heart rate 64 bpm.  Sinus tachycardia up to 121 bpm.  Occasional PVCs and ventricular couplets.   Stress test 03/08/18  Nuclear stress EF: 52%.  There was no ST segment deviation noted during stress.  The study is normal.  This is a low risk study.  The left ventricular ejection fraction is mildly decreased (45-54%).   Normal pharmacologic nuclear stress test with no evidence for prior infarct or ischemia.   Echo 02/22/18 Study Conclusions - Left ventricle: The cavity size was normal. There was mild focal   basal hypertrophy of the septum. Systolic  function was mildly   reduced. The estimated ejection fraction was in the range of 45%   to 50%. Diffuse hypokinesis. Doppler parameters are consistent   with abnormal left ventricular relaxation (grade 1 diastolic   dysfunction). - Aortic valve: There was trivial regurgitation. - Aortic root: The aortic root was mildly dilated. - Mitral valve: Calcified annulus. - Left atrium: The atrium was moderately dilated.  Impressions: - Mild global reduction  in LV systolic function; mild diastolic   dysfunction; mildly dilated aortic root; trace AI; moderate LAE.  EKG:  EKG is not ordered today.    Recent Labs: 07/12/2018: BUN 28; Creatinine, Ser 1.27; Potassium 4.6; Sodium 143   Recent Lipid Panel No results found for: CHOL, TRIG, HDL, CHOLHDL, VLDL, LDLCALC, LDLDIRECT  Physical Exam:    VS:  BP (!) 108/48 Comment: 88/48  Pulse (!) 42   Ht 5\' 10"  (1.778 m)   Wt 188 lb 12 oz (85.6 kg)   SpO2 96%   BMI 27.08 kg/m     Wt Readings from Last 3 Encounters:  09/21/18 188 lb 12 oz (85.6 kg)  06/24/18 183 lb (83 kg)  05/18/18 183 lb (83 kg)     Physical Exam  Constitutional: He is oriented to person, place, and time. He appears well-developed and well-nourished. No distress.  HENT:  Head: Normocephalic and atraumatic.  Neck: Normal range of motion. Neck supple. No JVD present.  Cardiovascular: Normal rate, normal heart sounds and intact distal pulses. A regularly irregular rhythm present. Exam reveals no gallop and no friction rub.  No murmur heard. Musculoskeletal: Normal range of motion. He exhibits no edema.  Neurological: He is alert and oriented to person, place, and time.  Skin: Skin is warm and dry.  Psychiatric: He has a normal mood and affect. His behavior is normal. Judgment and thought content normal.  Vitals reviewed.   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 PVC's (premature ventricular contractions)   3. 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 Pt is feeling drained and fatigued on the chlorthalidone. BP was too low on 25 mg daily. A little better on 12.5 mg bid- pt adjusted it himself but still drained and feels washed out.  -Labs done today indicate mild dehydration, see above -Will decrease chlorthalidone to 12.5 mg daily. He can hold the dose if SBP <110.  -He also feels drunk on the clonidine even after dose decreased to 0.1 mg at night. Will stop the clonidine.  -Continue lisinopril.  -Advised to increase fluid intake.  -He follows a low sodium diet.  -Will follow up in 2 weeks at HTN clinic and he want to see me back in a month.   PVCs/bradycardia -On exam pt seems to be in bigeminy -30 day monitor in May-June showed average HR 64, up to max 121. Occ PVCs and ventricular couplets. Holter monitor in June showed HR 55-115 with average 70 bpm. Occ PVCs, ventricular couplets, triplets and bigeminy with PVC load of 2%.  -Beta blocker was dc'd due to bradycardia. Unable to give BB to suppress PVCs.  -This may be contributing to his lack of energy but based on his monitor results it appears that his HR comes up with activity. He does not seem to require pacemaker at this time, but may consider EP consult if he does not feel better once his BP is well controlled.   Chronic systolic HF -Appears euvolemic, no edema. Unable to tolerate BB. On ARB. Decrease chlorthalidone and monitor closely.    Medication Adjustments/Labs and Tests Ordered: Current medicines are reviewed at length with the patient today.  Concerns regarding medicines are outlined above. Labs and tests ordered and medication changes are outlined in the patient instructions below:  Patient Instructions  Medication Instructions:  STOP: Clonidine   DECREASE: Chlorthalidone to 12.5 mg daily ( Do not  take if BP is <110 )  If you need a refill on your cardiac medications before your next appointment, please call your pharmacy.   Lab work: None  If you have labs (blood work) drawn today and your tests are completely normal, you will receive your results only by: [name] MyChart Message (if you have MyChart) OR . A paper copy in the mail If you have any lab test that is abnormal or we need to change your treatment, we will call you to review the results.  Testing/Procedures: None   Follow-Up: At [facility], you and your health needs are our priority.  As part of our continuing mission to provide you with exceptional heart care, we have created designated Provider Care Teams.  These Care Teams include your primary Cardiologist (physician) and Advanced Practice Providers (APPs -  Physician Assistants and Nurse Practitioners) who all work together to provide you with the care you need, when you need it. You will need a follow up appointment in 3-4 months. You may see Fransico Him, MD or one of the following Advanced Practice Providers on your designated Care Team:   [place], PA-C Melina Copa, PA-C . Ermalinda Barrios, PA-C  FOLLOW UP WITH NINA Aerika Groll, NP IN 1 MONTH   YOUR PHYSICIAN HAD REQUESTED THAT YOU SEE SOMEONE IN OUR HYPERTENSION CLINIC IN 2 WEEKS   REMEMBER TO STAY HYDRATED   Any Other Special Instructions Will Be Listed Below (If Applicable). '     Signed, Daune Perch, NP  09/21/2018 4:50 PM    [facility]

## 2018-09-27 ENCOUNTER — Telehealth: Payer: Self-pay | Admitting: Cardiology

## 2018-09-27 ENCOUNTER — Ambulatory Visit: Payer: Medicare Other | Admitting: Physician Assistant

## 2018-09-27 NOTE — Telephone Encounter (Signed)
New Message         Patient is calling today for a return call from Pecolia Ades for Reference to the medication that  reduces pulse.

## 2018-10-11 ENCOUNTER — Encounter: Payer: Self-pay | Admitting: Pharmacist

## 2018-10-11 ENCOUNTER — Ambulatory Visit (INDEPENDENT_AMBULATORY_CARE_PROVIDER_SITE_OTHER): Payer: Medicare Other | Admitting: Pharmacist

## 2018-10-11 VITALS — BP 118/66

## 2018-10-11 DIAGNOSIS — I119 Hypertensive heart disease without heart failure: Secondary | ICD-10-CM | POA: Diagnosis not present

## 2018-10-11 MED ORDER — CLONIDINE HCL 0.2 MG PO TABS: 0.2000 mg | ORAL_TABLET | Freq: Two times a day (BID) | ORAL | Status: DC

## 2018-10-11 MED ORDER — LISINOPRIL 40 MG PO TABS: 20.0000 mg | ORAL_TABLET | Freq: Two times a day (BID) | ORAL | 0 refills | Status: DC

## 2018-10-11 NOTE — Progress Notes (Signed)
Patient ID: Marcus King                 DOB: [birth year]                      MRN: [id number]     HPI: Marcus King is a 77 y.o. male patient of Dr.Turner who presents today for hypertension evaluation. PMH significant for HTN, anxiety, syncope secondary to orthostasis from dehydrationand OSA on CPAP. He has a history of orthostatic hypotension. There were occasional PVCs, ventricular couplets and triplets as well as bigeminal PVCs with a PVC load of 2%. Of note he is on ritalin. Since his follow up in HTN clinic he has been seen by Marcus Perch, PA. At his most recent visit, his chlorthalidone was decreased and his clonidine was stopped due to low blood pressures. At some point prior to this appt his carvedilol was apparently stopped due to low heartrate.   He presents today for blood pressure management with his wife. He states that he does feel better than when he previously saw me in clinic. He states that after his last visit he decided to adjust his medications himself to help with his pressures. He states that he has periods of high and periods of low. He believes that the chlorthalidone was actually the medication that was causing him to feel poorly and "drunk." He also believes that this is the medication that has been making his heartrate go low.   In looking at his log his pressure range from 160s-80s/90s-50s. Several days reveal pressures that are [id number] systolic in the morning then low 80s-100s several hours after medications. On his log he does denotes some days that he has taken his medication differently than prescribed (a full tablet of clonidine 0.2mg ) or extra 1/2 tablet of his lisinopril (total of 100mg  of lisinopril).   About half way through out conversation he seemed to really open up and start to be honest with how he is actually taking his medications (see above). Initially he had reported that he was taking clonidine 0.1mg  BID. He does occasionally take this dose if he feels  poorly, but he is unclear of exactly what triggers him to take one dose or another. Discussed that clonidine is a short acting drug and can contribute to the fluctuations if not taken appropriately. He reports that he was previously told that clonidine was not a blood pressure medication at all. We discussed that this medication can be used to help with PTSD/anxiety like symptoms as well, but it most definitely a blood pressure medication.   Current HTN meds:  Lisinopril 40mg  BID Clonidine 0.2mg  BID Furosemide 40mg  daily    Previously tried: beta blocker - carvedilol (unable to tolerate - brady), clonidine (low pressures), chlorthalidone (low blood pressures and drunk feeling)  BP goal: <130/80  Family History: Leukemia in his sister; Lung disease in his father; Stroke in his father and mother. There is no history of Alzheimer's disease.  Social History: former smoker, denies alcohol  Diet: Eat out and from home. He does endorse eating fast food when he eats out. He eats mostly meat and vegetables. He eats fruits vegetables and salads from home. He does use salt substitute at home. He does not add salt when eating out.   Exercise: He has not been regularly exercising, but does do yard work. He is very active around the home.   Home BP readings: see above  Wt Readings from Last 3 Encounters:  09/21/18 188 lb 12 oz (85.6 kg)  06/24/18 183 lb (83 kg)  05/18/18 183 lb (83 kg)   BP Readings from Last 3 Encounters:  10/11/18 118/66  09/21/18 (!) 108/48  07/12/18 122/72   Pulse Readings from Last 3 Encounters:  09/21/18 (!) 42  07/12/18 71  06/24/18 72    Renal function: CrCl cannot be calculated (Patient's most recent lab result is older than the maximum 21 days allowed.).  Past Medical History:  Diagnosis Date  . Anxiety   . Chronic pain of left knee 08/11/2016  . Food poisoning   . Headache    sinus  . Hypertension   . OSA on CPAP   . Prostate cancer ([place])   . S/P TKR  (total knee replacement) using cement, 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    Current Outpatient Medications on File Prior to Visit  Medication Sig Dispense Refill  . busPIRone (BUSPAR) 15 MG tablet Take 1 tablet by mouth 2 (two) times daily.  0  . hydrOXYzine (ATARAX/VISTARIL) 25 MG tablet Take 1 tablet by mouth as directed.    [name] PARoxetine (PAXIL) 40 MG tablet Take 40 mg by mouth daily.     . pravastatin (PRAVACHOL) 10 MG tablet Take 10 mg by mouth daily.     No current facility-administered medications on file prior to visit.     Allergies  Allergen Reactions  . Beef-Derived Products Diarrhea  . Clonazepam Other (See Comments)    High doses cause drunk feeling (>1 mg)  . Monosodium Glutamate Other (See Comments)    MSG (soups and oriental foods)  . Pegademase Bovine Diarrhea    Blood pressure 118/66.   Assessment/Plan: Hypertension: BMET today. BP today is at goal. Per his log his pressure does fluctuate quite a bit. I believe this is contributing to him feeling poorly. I do think in part this is related to him self medicating. We discussed at length the need to be compliant with the regimen that we prescribed as this makes it much easier for [place] to determine what is going on with pressures and medications. He is willing to follow the prescribed regimen until follow up with Marcus Husk, PA. I also provided the HTN clinic number should he have any issues before then. Will adjust the lisinopril to the max recommended dose 20mg  BID (>40mg  daily not associated with any additional BP benefit). Will also schedule clonidine 0.2mg  BID. Would plan if pressures still dipping low after AM medications to adjust morning clonidine to 0.1mg  QAM and 0.2mg  QPM. Will not resume chlorthalidone due to side effects.   Thank you, Marcus King. Marcus King, [facility]  10/11/2018 4:18 PM

## 2018-10-11 NOTE — Patient Instructions (Addendum)
Return for a follow up appointment in as scheduled with Estella Husk, PA  Go to the lab today  Check your blood pressure at home daily (if able) and keep record of the readings.  Take your BP meds as follows: TAKE lisinopril 20mg  TWICE DAILY TAKE clonidine 0.2mg  TWICE DAILY  Keep track of your pressures. Make a note of ANY changes you make with your blood pressure medications on your log.  If your pressure is low or you feel poorly please skip your clonidine dose and call the clinic [phone].   If pressures consistently run high or low please call the clinic at the number above for a sooner appt.   Bring all of your meds, your BP cuff and your record of home blood pressures to your next appointment.  Exercise as you're able, try to walk approximately 30 minutes per day.  Keep salt intake to a minimum, especially watch canned and prepared boxed foods.  Eat more fresh fruits and vegetables and fewer canned items.  Avoid eating in fast food restaurants.    HOW TO TAKE YOUR BLOOD PRESSURE: . Rest 5 minutes before taking your blood pressure. .  Don't smoke or drink caffeinated beverages for at least 30 minutes before. . Take your blood pressure before (not after) you eat. . Sit comfortably with your back supported and both feet on the floor (don't cross your legs). . Elevate your arm to heart level on a table or a desk. . Use the proper sized cuff. It should fit smoothly and snugly around your bare upper arm. There should be enough room to slip a fingertip under the cuff. The bottom edge of the cuff should be 1 inch above the crease of the elbow. . Ideally, take 3 measurements at one sitting and record the average.

## 2018-10-12 LAB — BASIC METABOLIC PANEL
BUN / CREAT RATIO: 17 (ref 10–24)
BUN: 22 mg/dL (ref 8–27)
CO2: 23 mmol/L (ref 20–29)
Calcium: 9.5 mg/dL (ref 8.6–10.2)
Chloride: 103 mmol/L (ref 96–106)
Creatinine, Ser: 1.31 mg/dL — ABNORMAL HIGH (ref 0.76–1.27)
GFR calc Af Amer: 60 mL/min/{1.73_m2} (ref >59)
GFR, EST NON AFRICAN AMERICAN: 52 mL/min/{1.73_m2} — AB (ref >59)
Glucose: 79 mg/dL (ref 65–99)
Potassium: 4.7 mmol/L (ref 3.5–5.2)
SODIUM: 141 mmol/L (ref 134–144)

## 2018-10-14 ENCOUNTER — Telehealth: Payer: Self-pay | Admitting: Pharmacist

## 2018-10-14 MED ORDER — CLONIDINE HCL 0.2 MG PO TABS: ORAL_TABLET | ORAL | Status: DC

## 2018-10-14 NOTE — Telephone Encounter (Signed)
Pt called to report that BP was 165/88 -> 92/41 yesterday and 162/97 ->124/71 today and he is feeling poorly in the mornings when pressures drop after medications. He states his evening pressure maintain around 140/80 before and after medications.   Will reduce his clonidine AM dose to 0.1mg  and continue all other as prescribed until follow up with Estella Husk.   Pt states understanding and will call with any additional concerns.

## 2018-10-20 ENCOUNTER — Ambulatory Visit: Payer: Medicare Other | Admitting: Cardiology

## 2018-10-20 DIAGNOSIS — L300 Nummular dermatitis: Secondary | ICD-10-CM | POA: Diagnosis not present

## 2018-10-20 DIAGNOSIS — L299 Pruritus, unspecified: Secondary | ICD-10-CM | POA: Diagnosis not present

## 2018-10-25 NOTE — Progress Notes (Signed)
Cardiology Office Note    Date:  10/26/2018   ID:  [name], DOB 06/22/1941, MRN [id number]  PCP:  Rochel Brome, MD  Cardiologist: Fransico Him, MD EPS: None  No chief complaint on file.   History of Present Illness:  Marcus King is a 77 y.o. male with history of hypertension, syncope secondary to orthostatic hypotension secondary to dehydration, OSA on CPAP, anxiety.  Blood pressures go up and down.  Has not tolerated clonidine because it makes him feel drunk at lower doses.  30-day monitor 04/2018 average heart rate 64 max heart rate of 121 occasional PVCs and ventricular couplets.  24-hour Holter in June heart rate 55-115 average heart rate 70 bpm occasional PVCs ventricular couplets, triplets and bigeminy with PVC load of 2%.  Saw Daune Perch 09/21/2018 which time he was adjusting his chlorthalidone but blood pressure still low and he was feeling washed out.  He seemed to be dehydrated.  She decrease chlorthalidone to 12.5 mg once daily and stop clonidine.  Beta-blockers cannot be used because of bradycardia.  Patient was then seen by Eino Farber 10/11/18 in the hypertension clinic was found that he was taking his medications differently than prescribed.  Sometimes he was taking clonidine 0.1 mg twice daily when he felt bad and sometimes he took an extra half a tablet of lisinopril for a total of 100 mg.  Blood pressure was at goal.  She felt he was feeling poorly due to self-medication.  She adjusted lisinopril to the max recommended dose of 20 mg twice daily, clonidine 0.2 mg twice daily.  If morning blood pressures still dipping low could reduce morning clonidine to 0.1 mg in the morning and point 2 in the afternoon.  Patient comes in today for follow-up.  He continues to self medicate.  He is on lisinopril 40 mg twice a day, increase Catapres to 0.2 twice daily and got Lasix somewhere and is taking 20 mg daily.  His blood pressures are elevated in the morning but drop in the  afternoon.  He also self medicates with chlorthalidone at times if his blood pressures are high.  He now says he is willing to take chlorthalidone again.  He thinks this is the medication that lowers his pulse but I explained to him the clonidine is the medication that lowers his pulse.  Past Medical History:  Diagnosis Date  . Anxiety   . Chronic pain of left knee 08/11/2016  . Food poisoning   . Headache    sinus  . Hypertension   . OSA on CPAP   . Prostate cancer ([place])   . 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 CARDIAC CATHETERIZATION    . CATARACT EXTRACTION W/ INTRAOCULAR LENS  IMPLANT, BILATERAL Bilateral   . COLON SURGERY  2014  . COLOSTOMY REVERSAL  2014  . HERNIA REPAIR     x2  . JOINT REPLACEMENT    . PROSTATE SURGERY  2006  . SEPTOPLASTY    . TOTAL KNEE ARTHROPLASTY Left 09/28/2016   Procedure: TOTAL KNEE ARTHROPLASTY;  Surgeon: [name], MD;  Location: [facility];  Service: Orthopedics;  Laterality: Left;    Current Medications: Current Meds  Medication Sig  . cloNIDine (CATAPRES) 0.1 MG tablet Take 0.02 mg by mouth 2 (two) times daily.  . hydrOXYzine (ATARAX/VISTARIL) 25 MG tablet Take 1 tablet by mouth as directed.  [name] PARoxetine (PAXIL) 40 MG tablet  Take 40 mg by mouth daily.   . pravastatin (PRAVACHOL) 10 MG tablet Take 10 mg by mouth daily.  . [DISCONTINUED] cloNIDine (CATAPRES) 0.2 MG tablet Take 0.10m in the morning and 0.23min the evening (Patient taking differently: Take 0.2 mg by mouth 2 (two) times daily. Take 0.67m17mn the morning and 0.2mg74m the evening)  . [DISCONTINUED] furosemide (LASIX) 20 MG tablet Take 20 mg by mouth daily.  . [DISCONTINUED] lisinopril (PRINIVIL,ZESTRIL) 40 MG tablet Take 40 mg by mouth 2 (two) times daily.     Allergies:   Beef-derived products; Clonazepam; Monosodium glutamate; and Pegademase bovine   Social History   Socioeconomic History  .  Marital status: Married    Spouse name: Not on file  . Number of children: 2  . Years of education: Not on file  . Highest education level: High school graduate  Occupational History  . Not on file  Social Needs  . Financial resource strain: Not on file  . Food insecurity:    Worry: Not on file    Inability: Not on file  . Transportation needs:    Medical: Not on file    Non-medical: Not on file  Tobacco Use  . Smoking status: Former Smoker    Years: 20.00    Types: Cigarettes    Last attempt to quit: 1979    Years since quitting: 40.8  . Smokeless tobacco: Never Used  . Tobacco comment: quit 37 years ago  Substance and Sexual Activity  . Alcohol use: No  . Drug use: No  . Sexual activity: Not on file  Lifestyle  . Physical activity:    Days per week: Not on file    Minutes per session: Not on file  . Stress: Not on file  Relationships  . Social connections:    Talks on phone: Not on file    Gets together: Not on file    Attends religious service: Not on file    Active member of club or organization: Not on file    Attends meetings of clubs or organizations: Not on file    Relationship status: Not on file  Other Topics Concern  . Not on file  Social History Narrative   LIves at home with his wife   Self employed   Right handed   Drinks about 2 cups of caffeine daily     Family History:  The patient's family history includes Leukemia in his sister; Lung disease in his father; Stroke in his father and mother.   ROS:   Please see the history of present illness.    Review of Systems  Constitution: Positive for malaise/fatigue.  HENT: Negative.   Cardiovascular: Negative.   Respiratory: Negative.   Endocrine: Negative.   Hematologic/Lymphatic: Negative.   Musculoskeletal: Negative.   Gastrointestinal: Negative.   Genitourinary: Negative.   Neurological: Negative.    All other systems reviewed and are negative.   PHYSICAL EXAM:   VS:  BP 126/70   Pulse  79   Ht '5\' 10"'  (1.778 m)   Wt 185 lb (83.9 kg)   SpO2 99%   BMI 26.54 kg/m   Physical Exam  GEN: Well nourished, well developed, in no acute distress  Neck: no JVD, carotid bruits, or masses Cardiac:RRR; no murmurs, rubs, or gallops  Respiratory:  clear to auscultation bilaterally, normal work of breathing GI: soft, nontender, nondistended, + BS Ext: without cyanosis, clubbing, or edema, Good distal pulses bilaterally Neuro:  Alert and Oriented  x 3 Psych: euthymic mood, full affect  Wt Readings from Last 3 Encounters:  10/26/18 185 lb (83.9 kg)  09/21/18 188 lb 12 oz (85.6 kg)  06/24/18 183 lb (83 kg)      Studies/Labs Reviewed:   EKG:  EKG is not ordered today.    Recent Labs: 10/11/2018: BUN 22; Creatinine, Ser 1.31; Potassium 4.7; Sodium 141   Lipid Panel No results found for: CHOL, TRIG, HDL, CHOLHDL, VLDL, LDLCALC, LDLDIRECT  Additional studies/ records that were reviewed today include:   24 hour holter monitor 05/20/18  Sinus bradycardia, normal sinus rhythm sinus tachycardia with average heart rate 70 bpm. The heart rate ranged from 55 to 115 bpm.  Occasional PACs and atrial couplets  Occasional PVCs, ventricular couplets and triplets and bigeminal PVCs with PVC load at 2%   30 day event monitor 05/18/18  Sinus rhythm with average heart rate 64 bpm.  Sinus tachycardia up to 121 bpm.  Occasional PVCs and ventricular couplets.   Stress test 03/08/18  Nuclear stress EF: 52%.  There was no ST segment deviation noted during stress.  The study is normal.  This is a low risk study.  The left ventricular ejection fraction is mildly decreased (45-54%).   Normal pharmacologic nuclear stress test with no evidence for prior infarct or ischemia.    Echo 02/22/18 Study Conclusions - Left ventricle: The cavity size was normal. There was mild focal   basal hypertrophy of the septum. Systolic function was mildly   reduced. The estimated ejection fraction was in the  range of 45%   to 50%. Diffuse hypokinesis. Doppler parameters are consistent   with abnormal left ventricular relaxation (grade 1 diastolic   dysfunction). - Aortic valve: There was trivial regurgitation. - Aortic root: The aortic root was mildly dilated. - Mitral valve: Calcified annulus. - Left atrium: The atrium was moderately dilated.   Impressions: - Mild global reduction in LV systolic function; mild diastolic   dysfunction; mildly dilated aortic root; trace AI; moderate LAE.      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 PVC's (premature ventricular contractions)   3. Dyslipidemia      PLAN:  In order of problems listed above:  Hypertensive heart disease with fluctuating blood pressure in the past and intolerance to different medications including chlorthalidone and beta-blocker(bradycardia).  Patient continues to self medicate and take inappropriate doses of lisinopril.  Discussed with Eino Farber as well.  We will try to get him to decrease his lisinopril to appropriate dose of 20 mg twice daily.  Stop Lasix.  Add chlorthalidone 25 mg once daily since he is taking this at times anyway.  Check be met today and follow-up with Claiborne Billings in the hypertension clinic in 2 weeks.  Has follow-up with Dr. Radford Pax in January.  Pulse dips down in the 40s at times but is mostly in the 60s, most likely secondary to the clonidine but he insisted on taking 0.2 mg twice daily.  PVCs with 2% load on 24-hour Holter 05/2018 cannot take beta-blocker.  Dyslipidemia on Pravachol  Medication Adjustments/Labs and Tests Ordered: Current medicines are reviewed at length with the patient today.  Concerns regarding medicines are outlined above.  Medication changes, Labs and Tests ordered today are listed in the Patient Instructions below. Patient Instructions  Medication Instructions:  Your physician has recommended you make the following change in your medication:  1.   DECREASE the Lisinopril to 20 mg taking 1  tablet twice a day 2.  STOP the Lasix 3.  START Chlorthalidone 25 mg taking 1 tablet daily  If you need a refill on your cardiac medications before your next appointment, please call your pharmacy.   Lab work: None ordered  If you have labs (blood work) drawn today and your tests are completely normal, you will receive your results only by: [name] MyChart Message (if you have MyChart) OR . A paper copy in the mail If you have any lab test that is abnormal or we need to change your treatment, we will call you to review the results.  Testing/Procedures: None ordered  Follow-Up: Your physician recommends that you schedule a follow-up appointment in: [place], [place], PHARM-D   Your physician recommends that you schedule a follow-up appointment in: [place] 12/22/2018   Any Other Special Instructions Will Be Listed Below (If Applicable).       Sumner Boast, PA-C  10/26/2018 8:32 AM    [facility] [address], [place], [place]  [address] Phone: [phone]; Fax: [phone]

## 2018-10-26 ENCOUNTER — Telehealth: Payer: Self-pay | Admitting: Psychology

## 2018-10-26 ENCOUNTER — Ambulatory Visit (INDEPENDENT_AMBULATORY_CARE_PROVIDER_SITE_OTHER): Payer: Medicare Other | Admitting: Physician Assistant

## 2018-10-26 ENCOUNTER — Encounter: Payer: Self-pay | Admitting: Physician Assistant

## 2018-10-26 VITALS — BP 126/70 | HR 79 | Ht 70.0 in | Wt 185.0 lb

## 2018-10-26 DIAGNOSIS — I493 Ventricular premature depolarization: Secondary | ICD-10-CM

## 2018-10-26 DIAGNOSIS — I119 Hypertensive heart disease without heart failure: Secondary | ICD-10-CM

## 2018-10-26 DIAGNOSIS — E785 Hyperlipidemia, unspecified: Secondary | ICD-10-CM | POA: Diagnosis not present

## 2018-10-26 MED ORDER — CHLORTHALIDONE 25 MG PO TABS: 25.0000 mg | ORAL_TABLET | Freq: Every day | ORAL | 3 refills | Status: DC

## 2018-10-26 MED ORDER — LISINOPRIL 20 MG PO TABS: 20.0000 mg | ORAL_TABLET | Freq: Two times a day (BID) | ORAL | 3 refills | Status: DC

## 2018-10-26 NOTE — Patient Instructions (Signed)
Medication Instructions:  Your physician has recommended you make the following change in your medication:  1.  DECREASE the Lisinopril to 20 mg taking 1 tablet twice a day 2.  STOP the Lasix 3.  START Chlorthalidone 25 mg taking 1 tablet daily  If you need a refill on your cardiac medications before your next appointment, please call your pharmacy.   Lab work: None ordered  If you have labs (blood work) drawn today and your tests are completely normal, you will receive your results only by: [name] MyChart Message (if you have MyChart) OR . A paper copy in the mail If you have any lab test that is abnormal or we need to change your treatment, we will call you to review the results.  Testing/Procedures: None ordered  Follow-Up: Your physician recommends that you schedule a follow-up appointment in: [place], [place], PHARM-D   Your physician recommends that you schedule a follow-up appointment in: [place] 12/22/2018   Any Other Special Instructions Will Be Listed Below (If Applicable).

## 2018-10-26 NOTE — Telephone Encounter (Signed)
Dr Bailar is leaving our office to take a job closer to where she lives. We have mailed a letter to patient to let her know we have canceled all appointments with Bailar. Thank you for your understanding °

## 2018-11-02 ENCOUNTER — Other Ambulatory Visit: Payer: Self-pay

## 2018-11-09 ENCOUNTER — Encounter: Payer: Self-pay | Admitting: Pharmacist

## 2018-11-09 ENCOUNTER — Ambulatory Visit (INDEPENDENT_AMBULATORY_CARE_PROVIDER_SITE_OTHER): Payer: Medicare Other | Admitting: Pharmacist

## 2018-11-09 VITALS — BP 130/90 | HR 81

## 2018-11-09 DIAGNOSIS — I119 Hypertensive heart disease without heart failure: Secondary | ICD-10-CM | POA: Diagnosis not present

## 2018-11-09 MED ORDER — CLONIDINE HCL 0.2 MG PO TABS: 0.2000 mg | ORAL_TABLET | Freq: Two times a day (BID) | ORAL | 1 refills | Status: DC

## 2018-11-09 MED ORDER — CHLORTHALIDONE 25 MG PO TABS: 12.5000 mg | ORAL_TABLET | Freq: Two times a day (BID) | ORAL | 1 refills | Status: DC

## 2018-11-09 NOTE — Progress Notes (Signed)
Patient ID: Marcus King                 DOB: [birth year]                      MRN: [id number]     HPI: Marcus King is a 77 y.o. male patient of Dr.Turner who presents today for hypertension follow up. PMH significant for HTN, anxiety, syncope secondary to orthostasis from dehydrationand OSA on CPAP. He has a history of orthostatic hypotension. There were occasional PVCs, ventricular couplets and triplets as well as bigeminal PVCs with a PVC load of 2%. Of note he is on ritalin. Since his follow up in HTN clinic he has been seen by Estella Husk, PA, who also received varying information from the patient on how and when he was taking his blood pressure medications. At this visit he wished to go back on chlorthalidone. This was restarted as well as his lisinopril dose again adjusted to the max dose. HR was noted to be on low side at last visit as well.   He presents today for blood pressure management with his log, blood pressure cuff, and medications, including those that he is not currently taking. He states that things have been going better over the last few weeks. He reports that there was one day that his pressure was high and he took lisinopril 40mg  in the morning to help as well as chlorthalidone 25mg . He has been taking chlorthalidone 12.5mg  BID as this does better for his pressures. He states that he has noticed dizziness and a decrease in memory. He used to be good at names, but now cannot recall names without writing them done. He is unsure if this is related to the medications or him getting older. He recently restarted Buspar (he had been off due to disagreement between pharmacy and prescriber). He alsohas been using pseudophed OTC for the last few days. He has on some form of allergy medication for several years.   Current HTN meds:  Lisinopril 20mg  BID (8am and 10pm) Clonidine 0.2mg  BID (8am and 10pm) Chlorthalidone 12.5mg  BID (8am and 10pm)  Previously tried: beta blocker - carvedilol  (unable to tolerate - brady), clonidine (low pressures), chlorthalidone (low blood pressures and drunk feeling)  BP goal: <130/80  Family History: Leukemia in his sister; Lung disease in his father; Stroke in his father and mother. There is no history of Alzheimer's disease.  Social History: former smoker, denies alcohol  Diet: Eat out and from home. He does endorse eating fast food when he eats out. He eats mostly meat and vegetables. He eats fruits vegetables and salads from home. He does use salt substitute at home. He does not add salt when eating out.   Exercise: He has not been regularly exercising, but does do yard work. He is very active around the home.   Home BP readings: See scan from today. Checks at least 6 times per day trend generally: 7am 150-180/80-100 9am 120-130/57-70 11am-1pm- 130-140/60 4pm-7pm- 110-120/70 9pm-10pm- 140-170/80-100  HR range 53-80s mostly >60  Wt Readings from Last 3 Encounters:  10/26/18 185 lb (83.9 kg)  09/21/18 188 lb 12 oz (85.6 kg)  06/24/18 183 lb (83 kg)   BP Readings from Last 3 Encounters:  11/09/18 130/90  10/26/18 126/70  10/11/18 118/66   Pulse Readings from Last 3 Encounters:  11/09/18 81  10/26/18 79  09/21/18 (!) 42    Renal function: CrCl cannot be calculated (Patient's most  recent lab result is older than the maximum 21 days allowed.).  Past Medical History:  Diagnosis Date  . Anxiety   . Chronic pain of left knee 08/11/2016  . Food poisoning   . Headache    sinus  . Hypertension   . OSA on CPAP   . Prostate cancer ([place])   . 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 busPIRone (BUSPAR) 15 MG tablet Take 15 mg by mouth 2 (two) times daily.    . hydrOXYzine (ATARAX/VISTARIL) 25 MG tablet Take 1 tablet by mouth as directed.    [name] lisinopril (PRINIVIL,ZESTRIL) 20 MG tablet Take 1 tablet (20 mg total)  by mouth 2 (two) times daily. 180 tablet 3  . PARoxetine (PAXIL) 40 MG tablet Take 40 mg by mouth daily.     . pravastatin (PRAVACHOL) 10 MG tablet Take 10 mg by mouth daily.     No current facility-administered medications on file prior to visit.     Allergies  Allergen Reactions  . Beef-Derived Products Diarrhea  . Clonazepam Other (See Comments)    High doses cause drunk feeling (>1 mg)  . Monosodium Glutamate Other (See Comments)    MSG (soups and oriental foods)  . Pegademase Bovine Diarrhea    Blood pressure 130/90, pulse 81.   Assessment/Plan: Hypertension: BP today borderline at goal. He is most concerned about elevated evening pressures. Discussed that elevated pressures yesterday (the day he took extra medication) was likely related to pseudoephedrine usage. Advised he limit as much as possible. Since late evening pressures seem to give him the most issue will adjust the timing of medications. Will adjust chlorthalidone evening dose to earlier (8am and 8pm). Will keep other dosages at 10pm to control early morning pressures. Advised that dizziness could possibly be related to Buspar as this medication is frequently associated with this and if continues to worsen should contact prescriber about adjusting dosage or alternative. Will have him follow up with Dr. Radford Pax as scheduled in 5 weeks and HTN clinic after if needed for additional medication adjustment.   Thank you, Lelan Pons. Patterson Hammersmith, [facility]  11/09/2018 10:52 AM

## 2018-11-09 NOTE — Patient Instructions (Addendum)
CHANGE chlorthalidone dose to 12.5mg  at 8am and 8pm  CONTINUE ALL other medications as prescribed  Continue to monitor dizziness, if it worsens consider talking to your doctor about Buspar dosage.   Continue to monitor your pressures and follow up as scheduled with Dr. Radford Pax - call the clinic if you have any issues or blood pressure concerns before then [phone]

## 2018-11-28 ENCOUNTER — Telehealth: Payer: Self-pay | Admitting: Psychology

## 2018-11-28 NOTE — Telephone Encounter (Signed)
Spoke with wife regarding letter that was mailed (Dr. Si Raider leaving). Wife stated they are still trying to decide on what to do and may just want to wait on Dr. Marcia Brash replacement.  Wife stated that pt is experiencing high blood pressure and that is their primary focus for now.

## 2018-12-22 ENCOUNTER — Ambulatory Visit: Payer: Medicare Other | Admitting: Cardiology

## 2019-01-02 ENCOUNTER — Ambulatory Visit (INDEPENDENT_AMBULATORY_CARE_PROVIDER_SITE_OTHER): Payer: Medicare Other | Admitting: Cardiology

## 2019-01-02 ENCOUNTER — Encounter: Payer: Self-pay | Admitting: Cardiology

## 2019-01-02 VITALS — BP 122/70 | HR 56 | Ht 70.0 in | Wt 186.4 lb

## 2019-01-02 DIAGNOSIS — I951 Orthostatic hypotension: Secondary | ICD-10-CM

## 2019-01-02 DIAGNOSIS — Z9989 Dependence on other enabling machines and devices: Secondary | ICD-10-CM | POA: Diagnosis not present

## 2019-01-02 DIAGNOSIS — I119 Hypertensive heart disease without heart failure: Secondary | ICD-10-CM | POA: Diagnosis not present

## 2019-01-02 DIAGNOSIS — G4733 Obstructive sleep apnea (adult) (pediatric): Secondary | ICD-10-CM | POA: Diagnosis not present

## 2019-01-02 DIAGNOSIS — I493 Ventricular premature depolarization: Secondary | ICD-10-CM | POA: Diagnosis not present

## 2019-01-02 MED ORDER — CARVEDILOL 12.5 MG PO TABS: 12.5000 mg | ORAL_TABLET | Freq: Two times a day (BID) | ORAL | 11 refills | Status: DC

## 2019-01-02 MED ORDER — HYDRALAZINE HCL 10 MG PO TABS: 10.0000 mg | ORAL_TABLET | Freq: Three times a day (TID) | ORAL | 11 refills | Status: DC

## 2019-01-02 NOTE — Progress Notes (Signed)
Cardiology Office Note:    Date:  01/02/2019   ID:  Marcus King, DOB 26-Apr-1941, MRN [id number]  PCP:  Rochel Brome, MD  Cardiologist:  Fransico Him, MD    Referring MD: Rochel Brome, MD   Chief Complaint  Patient presents with  . Follow-up    Orthostatic hypotension, hypertension    History of Present Illness:    Marcus King is a 78 y.o. male with a hx of HTN, anxiety, syncope secondary to orthostasis from dehydrationand OSA on CPAP. 2D echo showed mild LV dysfunction with EF 45-50% with diffuse HK and mildly dilated aortic root. Nuclear stress test showed no ischemia and EF 52%.  Holter monitor showed sinus bradycardia, normal sinus rhythm and sinus tachycardia with an average heart rate of 70 bpm.  His heart rate ranged from 55 to 115 bpm.  There were occasional PVCs, ventricular couplets and triplets as well as bigeminal PVCs with a PVC load of 2%.    He is here today for followup and is doing fairly well but has continued to have problems with orthostatic hypotension.  Unfortunately he is only been wearing his compression hose occasionally and has not been consistent in wearing them daily.  He said he had an episode recently where he was sitting down and all of a sudden felt very poorly and they checked his heart rate and was 39 bpm.  He also states that he had an episode of syncope.  In questioning him further his wife said that he was sitting down and was unresponsive for a few seconds.  This was not at the same time of his heart rate being documented at 39 bpm.  He still has days where his blood pressure is really high and then all of a sudden it will drop 40 mmHg.    He denies any chest pain or pressure, SOB, DOE, PND, orthopnea, LE edema,  palpitations. He is fairly compliant with his meds and is tolerating meds with no SE although he had to stop the chlorthalidone because it made him feel very bad.  He does not appear to have been self-medicating himself much as he has in the  past..  Last office visit with [name] he had been taking lisinopril 40 mg twice daily instead of 20 mg twice daily and he was counseled on only taking it 20 mg twice daily.  His Lasix was stopped and chlorthalidone was added.  Was recommended that he take clonidine once a day but with [name] in his last note he was still taking it twice daily.[name]  He is doing well with his CPAP device and thinks that he has gotten used to it.  He tolerates the mask and feels the pressure is adequate.  Since going on CPAP he feels rested in the am and has no significant daytime sleepiness.  He denies any significant mouth or nasal dryness or nasal congestion.  He does not think that he snores.     Past Medical History:  Diagnosis Date  . Anxiety   . Chronic pain of left knee 08/11/2016  . Food poisoning   . Headache    sinus  . Hypertension   . OSA on CPAP   . Prostate cancer ([place])   . 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 CARDIAC CATHETERIZATION    . CATARACT EXTRACTION W/ INTRAOCULAR LENS  IMPLANT, BILATERAL Bilateral   . COLON SURGERY  2014  . COLOSTOMY REVERSAL  2014  . HERNIA REPAIR     x2  . JOINT REPLACEMENT    . PROSTATE SURGERY  2006  . SEPTOPLASTY    . TOTAL KNEE ARTHROPLASTY Left 09/28/2016   Procedure: TOTAL KNEE ARTHROPLASTY;  Surgeon: [name], MD;  Location: [facility];  Service: Orthopedics;  Laterality: Left;    Current Medications: Current Meds  Medication Sig  . busPIRone (BUSPAR) 10 MG tablet Take 10 mg by mouth as needed.  . carvedilol (COREG) 25 MG tablet Take 25 mg by mouth 2 (two) times daily with a meal.  . cloNIDine (CATAPRES) 0.2 MG tablet Take 0.2 mg by mouth daily.  . hydrALAZINE (APRESOLINE) 25 MG tablet Take 25 mg by mouth as needed.  [name] lisinopril (PRINIVIL,ZESTRIL) 20 MG tablet Take 1 tablet (20 mg total) by mouth 2 (two) times daily.  [name] PARoxetine (PAXIL) 40 MG tablet Take 40 mg  by mouth daily.   . pravastatin (PRAVACHOL) 10 MG tablet Take 10 mg by mouth daily.     Allergies:   Beef-derived products; Clonazepam; Monosodium glutamate; and Pegademase bovine   Social History   Socioeconomic History  . Marital status: Married    Spouse name: Not on file  . Number of children: 2  . Years of education: Not on file  . Highest education level: High school graduate  Occupational History  . Not on file  Social Needs  . Financial resource strain: Not on file  . Food insecurity:    Worry: Not on file    Inability: Not on file  . Transportation needs:    Medical: Not on file    Non-medical: Not on file  Tobacco Use  . Smoking status: Former Smoker    Years: 20.00    Types: Cigarettes    Last attempt to quit: 1979    Years since quitting: 41.0  . Smokeless tobacco: Never Used  . Tobacco comment: quit 37 years ago  Substance and Sexual Activity  . Alcohol use: No  . Drug use: No  . Sexual activity: Not on file  Lifestyle  . Physical activity:    Days per week: Not on file    Minutes per session: Not on file  . Stress: Not on file  Relationships  . Social connections:    Talks on phone: Not on file    Gets together: Not on file    Attends religious service: Not on file    Active member of club or organization: Not on file    Attends meetings of clubs or organizations: Not on file    Relationship status: Not on file  Other Topics Concern  . Not on file  Social History Narrative   LIves at home with his wife   Self employed   Right handed   Drinks about 2 cups of caffeine daily     Family History: The patient's family history includes Leukemia in his sister; Lung disease in his father; Stroke in his father and mother. There is no history of Alzheimer's disease.  ROS:   Please see the history of present illness.    ROS  All other systems reviewed and negative.   EKGs/Labs/Other Studies Reviewed:    The following studies were reviewed  today: PAP download  EKG:  EKG is not ordered today.    Recent Labs: 10/11/2018: BUN 22; Creatinine, Ser 1.31; Potassium 4.7; Sodium 141   Recent Lipid Panel No results found for: CHOL, TRIG, HDL,  CHOLHDL, VLDL, LDLCALC, LDLDIRECT  Physical Exam:    VS:  BP 122/70   Pulse (!) 56   Ht 5\' 10"  (1.778 m)   Wt 186 lb 6.4 oz (84.6 kg)   SpO2 97%   BMI 26.75 kg/m     Wt Readings from Last 3 Encounters:  01/02/19 186 lb 6.4 oz (84.6 kg)  10/26/18 185 lb (83.9 kg)  09/21/18 188 lb 12 oz (85.6 kg)    GEN:  Well nourished, well developed in no acute distress HEENT: Normal NECK: No JVD; No carotid bruits LYMPHATICS: No lymphadenopathy CARDIAC: RRR, no murmurs, rubs, gallops RESPIRATORY:  Clear to auscultation without rales, wheezing or rhonchi  ABDOMEN: Soft, non-tender, non-distended MUSCULOSKELETAL:  No edema; No deformity  SKIN: Warm and dry NEUROLOGIC:  Alert and oriented x 3 PSYCHIATRIC:  Normal affect   ASSESSMENT:    1. Orthostatic hypotension   2. PVC's (premature ventricular contractions)   3. Hypertensive left ventricular hypertrophy, without heart failure   4. OSA on CPAP    PLAN:    In order of problems listed above:  1. Orthostatic hypotension -he is continued to have problems with dizziness and drops in his systolic blood pressure is much is 40 mmHg.  Unfortunately he is not a candidate for Florinef or ProAmatine because he has resting hypertension.  He has not been as compliant with compression hose as I like him to be.  I am going to switch him from knee-high to thigh-high compression hose and have stressed the importance of wearing these daily while he is up and around in the daytime.  I am could order a an a.m. cortisol to rule out adrenal insufficiency since he has been significantly fatigued as well and his downloads this past year have shown adequate compliance and adequately treated sleep apnea.  Orthostatics in the office today did show a drop of greater  than 20 mm in systolic blood pressure with a blunted heart rate response.  His heart rate basically stayed at 59 bpm despite blood pressure dropping.  I suspect his beta-blocker is contributing to inability to increase heart rate in response to drops in blood pressure.  Unfortunately he needs better blood pressure control.  His blood pressure is fairly labile.  I am going to decrease his carvedilol to 12.5 mg twice daily and start him on hydralazine 10 mg 3 times daily.  He will continue on his clonidine 0.1 mg daily and his lisinopril 20 mg twice daily.-See my physician assistant back in 2 weeks and if he still having problems then would recommend trying to wean his beta-blocker down further to 6.25 mg twice daily while uptitrating his hydralazine.  May need to consider Mestinon if we cannot get control of his orthostatic episodes.  2.  PVCs -he denies any significant palpitations.  He will continue on carvedilol 25 mg twice daily.  3.  Hypertensive LVH -BP is adequately controlled on exam today but he is having wide swings He will continue on clonidine 0.2 mg daily, lisinopril 20 mg twice daily, I am going to add hydralazine 10 mg 3 times daily.  I will cut his carvedilol back to 12.5 mg twice daily due to blunted heart rate response to drops in blood pressure.  His last creatinine was stable at 1.31 on 10/11/2018 and potassium 4.7.  4.  OSA - the patient is tolerating PAP therapy well without any problems.     Medication Adjustments/Labs and Tests Ordered: Current medicines are reviewed at length  with the patient today.  Concerns regarding medicines are outlined above.  No orders of the defined types were placed in this encounter.  No orders of the defined types were placed in this encounter.   Signed, Fransico Him, MD  01/02/2019 11:11 AM    [place]

## 2019-01-02 NOTE — Patient Instructions (Addendum)
Medication Instructions:  Decrease: Carvedilol 12.5 mg, twice a day Decrease: Hydralazine 10 mg, three times a day  If you need a refill on your cardiac medications before your next appointment, please call your pharmacy.   Lab work: Future: Cortisol-am: 1/22 at 8 am at our lab.  If you have labs (blood work) drawn today and your tests are completely normal, you will receive your results only by: [name] MyChart Message (if you have MyChart) OR . A paper copy in the mail If you have any lab test that is abnormal or we need to change your treatment, we will call you to review the results.  Testing/Procedures: None  Follow-Up: Your physician recommends that you schedule a follow-up appointment in: 2 weeks with Ermalinda Barrios, PA-C  Your physician recommends that you schedule a follow-up appointment in: 2 months with Dr. Radford Pax  Check blood pressure twice a day for 1 week and call our office with all the results.   Thigh high compression hose ordered

## 2019-01-04 ENCOUNTER — Other Ambulatory Visit: Payer: Medicare Other | Admitting: *Deleted

## 2019-01-04 DIAGNOSIS — I119 Hypertensive heart disease without heart failure: Secondary | ICD-10-CM | POA: Diagnosis not present

## 2019-01-04 DIAGNOSIS — I951 Orthostatic hypotension: Secondary | ICD-10-CM

## 2019-01-05 ENCOUNTER — Telehealth: Payer: Self-pay

## 2019-01-05 DIAGNOSIS — I951 Orthostatic hypotension: Secondary | ICD-10-CM

## 2019-01-05 DIAGNOSIS — I110 Hypertensive heart disease with heart failure: Secondary | ICD-10-CM

## 2019-01-05 LAB — CORTISOL-AM, BLOOD: Cortisol - AM: 8.1 ug/dL (ref 6.2–19.4)

## 2019-01-05 NOTE — Telephone Encounter (Signed)
Spoke with the patient, he expressed understanding about the referral to Nephrology.

## 2019-01-05 NOTE — Telephone Encounter (Signed)
Please refer him to Dr. Jimmy Footman for recommendations

## 2019-01-05 NOTE — Telephone Encounter (Signed)
Spoke with the patient, since changing his medications he stated in the morning he takes his medication and around 10/11 am, he starts to feel "drunk feeling" and dizzy. His pressure around that time was 120/57. After this his pressure slowly increases and stays around 150-160 range the rest of the day. Sending to Dr. Radford Pax for recommendations.

## 2019-01-09 ENCOUNTER — Emergency Department (HOSPITAL_COMMUNITY)
Admission: EM | Admit: 2019-01-09 | Discharge: 2019-01-10 | Disposition: A | Discharge status: Home / Self Care | Payer: Medicare Other | Attending: Emergency Medicine | Admitting: Emergency Medicine

## 2019-01-09 ENCOUNTER — Emergency Department (HOSPITAL_COMMUNITY): Payer: Medicare Other

## 2019-01-09 DIAGNOSIS — R4789 Other speech disturbances: Secondary | ICD-10-CM | POA: Diagnosis not present

## 2019-01-09 DIAGNOSIS — Z87891 Personal history of nicotine dependence: Secondary | ICD-10-CM | POA: Diagnosis not present

## 2019-01-09 DIAGNOSIS — I5032 Chronic diastolic (congestive) heart failure: Secondary | ICD-10-CM | POA: Diagnosis not present

## 2019-01-09 DIAGNOSIS — Z8546 Personal history of malignant neoplasm of prostate: Secondary | ICD-10-CM | POA: Insufficient documentation

## 2019-01-09 DIAGNOSIS — R42 Dizziness and giddiness: Secondary | ICD-10-CM | POA: Insufficient documentation

## 2019-01-09 DIAGNOSIS — R4182 Altered mental status, unspecified: Secondary | ICD-10-CM | POA: Diagnosis not present

## 2019-01-09 DIAGNOSIS — R4781 Slurred speech: Secondary | ICD-10-CM | POA: Diagnosis present

## 2019-01-09 DIAGNOSIS — I110 Hypertensive heart disease with heart failure: Secondary | ICD-10-CM | POA: Diagnosis not present

## 2019-01-09 DIAGNOSIS — E785 Hyperlipidemia, unspecified: Secondary | ICD-10-CM | POA: Diagnosis not present

## 2019-01-09 DIAGNOSIS — Z79899 Other long term (current) drug therapy: Secondary | ICD-10-CM | POA: Diagnosis not present

## 2019-01-09 DIAGNOSIS — I951 Orthostatic hypotension: Secondary | ICD-10-CM

## 2019-01-09 DIAGNOSIS — R410 Disorientation, unspecified: Secondary | ICD-10-CM | POA: Diagnosis not present

## 2019-01-09 DIAGNOSIS — R0902 Hypoxemia: Secondary | ICD-10-CM | POA: Diagnosis not present

## 2019-01-09 LAB — COMPREHENSIVE METABOLIC PANEL
ALBUMIN: 3.5 g/dL (ref 3.5–5.0)
ALK PHOS: 66 U/L (ref 38–126)
ALT: 14 U/L (ref 0–44)
ANION GAP: 9 (ref 5–15)
AST: 22 U/L (ref 15–41)
BUN: 17 mg/dL (ref 8–23)
CO2: 28 mmol/L (ref 22–32)
Calcium: 9.5 mg/dL (ref 8.9–10.3)
Chloride: 100 mmol/L (ref 98–111)
Creatinine, Ser: 1.21 mg/dL (ref 0.61–1.24)
GFR calc Af Amer: >60 mL/min (ref >60)
GFR calc non Af Amer: 57 mL/min — ABNORMAL LOW (ref >60)
GLUCOSE: 100 mg/dL — AB (ref 70–99)
POTASSIUM: 3.9 mmol/L (ref 3.5–5.1)
SODIUM: 137 mmol/L (ref 135–145)
Total Bilirubin: 0.7 mg/dL (ref 0.3–1.2)
Total Protein: 6.1 g/dL — ABNORMAL LOW (ref 6.5–8.1)

## 2019-01-09 LAB — CBC WITH DIFFERENTIAL/PLATELET
Abs Immature Granulocytes: 0.05 10*3/uL (ref 0.00–0.07)
BASOS ABS: 0 10*3/uL (ref 0.0–0.1)
BASOS PCT: 0 %
EOS PCT: 6 %
Eosinophils Absolute: 0.4 10*3/uL (ref 0.0–0.5)
HCT: 41.6 % (ref 39.0–52.0)
HEMOGLOBIN: 13.6 g/dL (ref 13.0–17.0)
Immature Granulocytes: 1 %
LYMPHS PCT: 26 %
Lymphs Abs: 2 10*3/uL (ref 0.7–4.0)
MCH: 29.3 pg (ref 26.0–34.0)
MCHC: 32.7 g/dL (ref 30.0–36.0)
MCV: 89.7 fL (ref 80.0–100.0)
Monocytes Absolute: 0.8 10*3/uL (ref 0.1–1.0)
Monocytes Relative: 10 %
NEUTROS ABS: 4.4 10*3/uL (ref 1.7–7.7)
Neutrophils Relative %: 57 %
PLATELETS: 255 10*3/uL (ref 150–400)
RBC: 4.64 MIL/uL (ref 4.22–5.81)
RDW: 13.5 % (ref 11.5–15.5)
WBC: 7.6 10*3/uL (ref 4.0–10.5)
nRBC: 0 % (ref 0.0–0.2)

## 2019-01-09 LAB — URINALYSIS, ROUTINE W REFLEX MICROSCOPIC
BILIRUBIN URINE: NEGATIVE
GLUCOSE, UA: NEGATIVE mg/dL
HGB URINE DIPSTICK: NEGATIVE
Ketones, ur: NEGATIVE mg/dL
LEUKOCYTES UA: NEGATIVE
NITRITE: NEGATIVE
Protein, ur: NEGATIVE mg/dL
Specific Gravity, Urine: 1.01 (ref 1.005–1.030)
pH: 6 (ref 5.0–8.0)

## 2019-01-09 LAB — APTT: APTT: 29 s (ref 24–36)

## 2019-01-09 LAB — RAPID URINE DRUG SCREEN, HOSP PERFORMED
AMPHETAMINES: NOT DETECTED
Barbiturates: NOT DETECTED
Benzodiazepines: NOT DETECTED
Cocaine: NOT DETECTED
Opiates: NOT DETECTED
Tetrahydrocannabinol: NOT DETECTED

## 2019-01-09 LAB — ETHANOL: Alcohol, Ethyl (B): <10 mg/dL (ref <10)

## 2019-01-09 LAB — PROTIME-INR
INR: 1.03
Prothrombin Time: 13.4 seconds (ref 11.4–15.2)

## 2019-01-09 LAB — I-STAT TROPONIN, ED: Troponin i, poc: 0 ng/mL (ref 0.00–0.08)

## 2019-01-09 NOTE — ED Provider Notes (Signed)
[facility] Provider Note   CSN: [id number] Arrival date & time: 01/09/19  1846     History   Chief Complaint Chief Complaint  Patient presents with  . Altered Mental Status    HPI    Marcus King is a 78 y.o. male presenting today for slurred speech and lightheadedness.  History obtained from both EMS and patient  EMS called to residents for concern of fall, patient's wife states that she heard a loud crash and that there was a new scuff on the wall where she believed the patient fell, at EMS arrival patient ambulatory and denying a fall, states that he feels well.  EMS noted patient had somewhat slurred speech and dizzy with standing.  Patient reports that he woke up with some dizziness and "slower speech" this morning after going to bed around 10 PM last night.  Patient states that he has had intermittent slowness of speech and dizziness whenever his "medications are off".  Patient reports starting new medication chlorthalidone recently and believes that he is overmedicated, he states that he took chlorthalidone last night after having not taken it for the past few weeks.  Patient is convinced that his symptoms will resolve once the chlorthalidone wears off.  Patient describes his dizziness as a lightheaded feeling that only occurs when he stands.  Patient denies numbness/tingling or weakness, vision changes, neck pain, fever, nausea/vomiting, abdominal pain, chest pain, shortness of breath, recent illness or any additional concerns.  Patient is alert and oriented, denies fall today and states that his wall does not have any new scuff marks on it and it looks the same as it always has.  Upon ED arrival patient's last seen normal greater than 18 hours ago.  HPI  Past Medical History:  Diagnosis Date  . Anxiety   . Chronic pain of left knee 08/11/2016  . Food poisoning   . Headache    sinus  . Hypertension   . OSA on CPAP   . Prostate  cancer ([place])   . 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 PVC's (premature ventricular contractions) 05/18/2018  . Edema extremities 04/14/2018  . Orthostatic hypotension 01/14/2018  . Dyslipidemia 08/11/2017  . LVH (left ventricular hypertrophy) due to hypertensive disease 08/11/2017  . Anxiety 08/05/2017  . Food poisoning 08/05/2017  . Headache 08/05/2017  . Hypertensive heart disease 08/05/2017  . OSA on CPAP   . 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 Chronic pain of left knee 08/11/2016    Past Surgical History:  Procedure Laterality Date  . CARDIAC CATHETERIZATION    . CATARACT EXTRACTION W/ INTRAOCULAR LENS  IMPLANT, BILATERAL Bilateral   . COLON SURGERY  2014  . COLOSTOMY REVERSAL  2014  . HERNIA REPAIR     x2  . JOINT REPLACEMENT    . PROSTATE SURGERY  2006  . SEPTOPLASTY    . TOTAL KNEE ARTHROPLASTY Left 09/28/2016   Procedure: TOTAL KNEE ARTHROPLASTY;  Surgeon: [name], MD;  Location: [facility];  Service: Orthopedics;  Laterality: Left;        Home Medications    Prior to Admission medications   Medication Sig Start Date End Date Taking? Authorizing Provider  busPIRone (BUSPAR) 10 MG tablet Take 10 mg by mouth as needed (for anxiety).  Yes [provider]  carvedilol (COREG) 12.5 MG tablet Take 1 tablet (12.5 mg total) by mouth 2 (two) times daily. 01/02/19 01/02/20 Yes Turner, Eber Hong, MD  chlorthalidone (HYGROTON) 25 MG tablet Take 25 mg by mouth once.   Yes [provider]  cloNIDine (CATAPRES) 0.2 MG tablet Take 0.2 mg by mouth daily.   Yes [provider]  hydrALAZINE (APRESOLINE) 10 MG tablet Take 1 tablet (10 mg total) by mouth 3 (three) times daily. 01/02/19 01/02/20 Yes Turner, Eber Hong, MD  lisinopril (PRINIVIL,ZESTRIL) 20 MG tablet  Take 1 tablet (20 mg total) by mouth 2 (two) times daily. 10/26/18 01/24/19 Yes Imogene Burn, PA-C  PARoxetine (PAXIL) 40 MG tablet Take 40 mg by mouth daily.  06/16/17  Yes [provider]  pravastatin (PRAVACHOL) 10 MG tablet Take 10 mg by mouth daily.   Yes [provider]    Family History Family History  Problem Relation Age of Onset  . Stroke Mother   . Lung disease Father   . Stroke Father   . Leukemia Sister   . Alzheimer's disease Neg Hx     Social History Social History   Tobacco Use  . Smoking status: Former Smoker    Years: 20.00    Types: Cigarettes    Last attempt to quit: 1979    Years since quitting: 41.0  . Smokeless tobacco: Never Used  . Tobacco comment: quit 37 years ago  Substance Use Topics  . Alcohol use: No  . Drug use: No     Allergies   Beef-derived products; Clonazepam; Monosodium glutamate; and Pegademase bovine   Review of Systems Review of Systems  Constitutional: Negative.  Negative for chills and fever.  Eyes: Negative.  Negative for visual disturbance.  Respiratory: Negative.  Negative for cough and shortness of breath.   Cardiovascular: Negative.  Negative for chest pain.  Gastrointestinal: Negative.  Negative for abdominal pain, blood in stool, diarrhea, nausea and vomiting.  Musculoskeletal: Negative.  Negative for back pain and neck pain.  Neurological: Positive for speech difficulty (Slow speech) and light-headedness. Negative for syncope, weakness, numbness and headaches.  All other systems reviewed and are negative.   Physical Exam Updated Vital Signs BP 112/73   Pulse 60   Temp 98.9 F (37.2 C) (Oral)   SpO2 96%   Physical Exam Constitutional:      General: He is not in acute distress.    Appearance: Normal appearance. He is well-developed. He is not ill-appearing or diaphoretic.  HENT:     Head: Normocephalic and atraumatic. No raccoon eyes, Battle's sign, abrasion or contusion.     Right Ear:  Tympanic membrane, ear canal and external ear normal. No hemotympanum.     Left Ear: Tympanic membrane, ear canal and external ear normal. No hemotympanum.     Nose: Nose normal.     Mouth/Throat:     Lips: Pink.     Mouth: Mucous membranes are moist.     Pharynx: Oropharynx is clear. Uvula midline.  Eyes:     General: Vision grossly intact. Gaze aligned appropriately.     Extraocular Movements: Extraocular movements intact.     Conjunctiva/sclera: Conjunctivae normal.     Pupils: Pupils are equal, round, and reactive to light.  Neck:     Musculoskeletal: Full passive range of motion without pain, normal range of motion and neck supple. No spinous process tenderness or muscular tenderness.     Trachea: Trachea normal. No tracheal deviation.  Cardiovascular:     Rate and Rhythm: Normal rate and regular rhythm.     Pulses:          Dorsalis pedis pulses are 2+ on the right side and 2+ on the left side.       Posterior tibial pulses are 2+ on the right side and 2+ on the left side.     Heart sounds: Normal heart sounds.  Pulmonary:     Effort: Pulmonary effort is normal. No respiratory distress.     Breath sounds: Normal breath sounds and air entry. No rhonchi.  Chest:     Chest wall: No deformity, tenderness or crepitus.  Abdominal:     General: There is no distension.     Palpations: Abdomen is soft.     Tenderness: There is no abdominal tenderness. There is no guarding or rebound.  Musculoskeletal: Normal range of motion.     Right lower leg: Normal. He exhibits no tenderness and no swelling.     Left lower leg: Normal. He exhibits no tenderness and no swelling.     Comments: No midline C/T/L spinal tenderness to palpation, no paraspinal muscle tenderness, no deformity, crepitus, or step-off noted. No sign of injury to the neck or back.  Hips stable to compression bilaterally without pain.  Patient able to bring knee-to-chest bilaterally without pain.  Feet:     Right foot:      Protective Sensation: 3 sites tested. 3 sites sensed.     Left foot:     Protective Sensation: 3 sites tested. 3 sites sensed.  Skin:    General: Skin is warm and dry.  Neurological:     Mental Status: He is alert and oriented to person, place, and time.     GCS: GCS eye subscore is 4. GCS verbal subscore is 5. GCS motor subscore is 6.     Comments: Mental Status: Alert, oriented, thought content appropriate, able to give a coherent history.  Patient appears to have somewhat of a lisp and with slightly slurred speech, he does not appear to have difficulty with word finding. Able to follow 2 step commands without difficulty. Cranial Nerves: II: Peripheral visual fields grossly normal, pupils equal, round, reactive to light III,IV, VI: ptosis not present, extra-ocular motions intact bilaterally V,VII: smile symmetric, eyebrows raise symmetric, facial light touch sensation equal VIII: hearing grossly normal to voice X: uvula elevates symmetrically XI: bilateral shoulder shrug symmetric and strong XII: midline tongue extension without fassiculations Motor: Normal tone. 5/5 strength in upper and lower extremities bilaterally including strong and equal grip strength and dorsiflexion/plantar flexion Sensory: Sensation intact to light touch in all extremities.Negative Romberg.  Cerebellar: normal finger-to-nose with bilateral upper extremities. Normal heel-to -shin balance bilaterally of the lower extremity. No pronator drift.  CV: distal pulses palpable throughout  Psychiatric:        Mood and Affect: Mood normal.        Behavior: Behavior normal.    ED Treatments / Results  Labs (all labs ordered are listed, but only abnormal results are displayed) Labs Reviewed  COMPREHENSIVE METABOLIC PANEL - Abnormal; Notable for the following components:      Result Value   Glucose, Bld 100 (*)    Total Protein 6.1 (*)    GFR calc non Af Amer 57 (*)    All other components within normal  limits  CBC WITH DIFFERENTIAL/PLATELET  URINALYSIS, ROUTINE W REFLEX MICROSCOPIC  PROTIME-INR  ETHANOL  APTT  RAPID URINE DRUG  SCREEN, HOSP PERFORMED  I-STAT TROPONIN, ED  CBG MONITORING, ED    EKG EKG Interpretation  Date/Time:  Monday January 09 2019 18:52:27 EST Ventricular Rate:  62 PR Interval:    QRS Duration: 94 QT Interval:  454 QTC Calculation: 462 R Axis:   7 Text Interpretation:  Sinus rhythm Borderline T wave abnormalities Confirmed by Virgel Manifold [phone]) on 01/09/2019 9:03:37 PM   Radiology Ct Head Wo Contrast  Result Date: 01/09/2019 CLINICAL DATA:  Altered mental status since this morning. EXAM: CT HEAD WITHOUT CONTRAST TECHNIQUE: Contiguous axial images were obtained from the base of the skull through the vertex without intravenous contrast. COMPARISON:  MRI of the head 11/12/2017 FINDINGS: Brain: Age related involutional changes of brain with sulcal and ventricular prominence. Chronic appearing small vessel ischemic disease of periventricular and subcortical white matter. No acute intracranial hemorrhage, midline shift or edema. Midline fourth ventricle and basal cisterns without effacement. No large vascular territory infarct. Intra-axial mass nor extra-axial fluid. Vascular: Moderate atherosclerosis of the carotid siphons. Hyperdense vessel or unexpected calcification. Skull: No acute fracture or significant calvarial soft tissue swelling. Sinuses/Orbits: Bilateral cataract extractions with intra-ocular lens replacements. Right maxillary sinus mucosal thickening. Other: None IMPRESSION: Atrophy with chronic small vessel ischemic disease. No acute intracranial abnormality. Electronically Signed   By: Ashley Royalty M.D.   On: 01/09/2019 22:20    Procedures Procedures (including critical care time)  Medications Ordered in ED Medications - No data to display   Initial Impression / Assessment and Plan / ED Course  I have reviewed the triage vital signs and the  nursing notes.  Pertinent labs & imaging results that were available during my care of the patient were reviewed by me and considered in my medical decision making (see chart for details).    78 year old male presenting for slow speech since waking up this morning.  Last known normal 10 PM last night.  No obvious neuro deficits on exam today, patient well-appearing and without pain.  Patient able to sit up pain bring himself to a standing position without assistance or lightheadedness.  Patient speech appears to be slightly slower however does not appear to be having difficulty with word finding.  Troponin negative Ethanol negative APTT within normal limits PT/INR within normal limits CBC within normal limits CMP nonacute Urinalysis within normal limits UDS negative EKG without acute findings reviewed by Dr. Wilson Singer CT head without acute findings  Patient reevaluated, resting comfortably and in no acute distress.  Patient without any complaints, denies headedness.  Patient's wife now at bedside states that she feels that the patient speech is still somewhat slower than normal. ------------------  Care handoff given to Dr. Wilson Singer at shift change.   Note: Portions of this report may have been transcribed using voice recognition software. Every effort was made to ensure accuracy; however, inadvertent computerized transcription errors may still be present. Final Clinical Impressions(s) / ED Diagnoses   Final diagnoses:  Slow rate of speech    ED Discharge Orders    None       Gari Crown 01/09/19 2254    Virgel Manifold, MD 01/19/19 1610

## 2019-01-09 NOTE — ED Triage Notes (Addendum)
Pt [facility] from home with altered mental status sine this morning. Last known well 2200 last night. C/o dizziness for EMS. Wife states that she heard pt fall today. Patient states that he did not fall and does not remember losing his balance. VSS for EMS. A/Ox4.

## 2019-01-09 NOTE — ED Notes (Signed)
Pt unsteady on feet for Ortho VS. Sways forward and back as this EMT holds his arm.

## 2019-01-10 NOTE — ED Notes (Signed)
ED Provider at bedside. 

## 2019-01-14 IMAGING — NM NM MISC PROCEDURE
8 series · 48 of 48 positions shown · non-contrast
Comparison: none

[Series 1: rest · 6.40mm/px · 6 of 64 frames shown]
[frame 6/64]
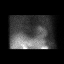
[frame 16/64]
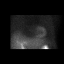
[frame 27/64]
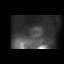
[frame 38/64]
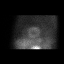
[frame 48/64]
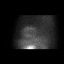
[frame 59/64]
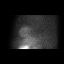

[Series 1: stress-gsp · 6.40mm/px · 6 of 492 frames shown]
[frame 42/492  full-range]
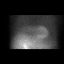
[frame 124/492  full-range]
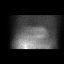
[frame 206/492  full-range]
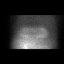
[frame 288/492  full-range]
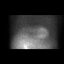
[frame 370/492  full-range]
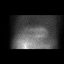
[frame 452/492  full-range]
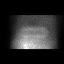

[Series 1: wbr_s-proj_st stress-gsp · 6.40mm/px · 6 of 512 frames shown]
[frame 43/512]
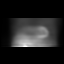
[frame 128/512]
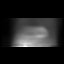
[frame 214/512]
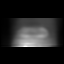
[frame 299/512]
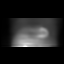
[frame 384/512]
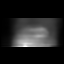
[frame 470/512]
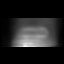

[Series 1: wbr_r-proj_st rest-mc · 6.40mm/px · 6 of 64 frames shown]
[frame 6/64]
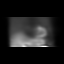
[frame 16/64]
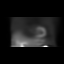
[frame 27/64]
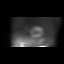
[frame 38/64]
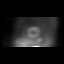
[frame 48/64]
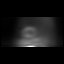
[frame 59/64]
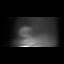

[Series 1: wbr_r-proj_st rest · 6.40mm/px · 6 of 64 frames shown]
[frame 6/64]
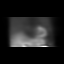
[frame 16/64]
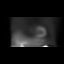
[frame 27/64]
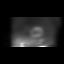
[frame 38/64]
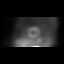
[frame 48/64]
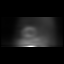
[frame 59/64]
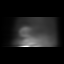

[Series 1: stress-sum-em · 6.40mm/px · 6 of 64 frames shown]
[frame 6/64]
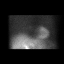
[frame 16/64]
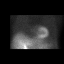
[frame 27/64]
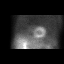
[frame 38/64]
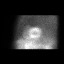
[frame 48/64]
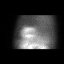
[frame 59/64]
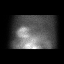

[Series 1: rest-mc · 6.40mm/px · 6 of 64 frames shown]
[frame 6/64]
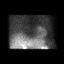
[frame 16/64]
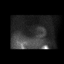
[frame 27/64]
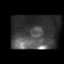
[frame 38/64]
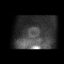
[frame 48/64]
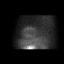
[frame 59/64]
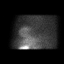

[Series 1: wbr_s-proj_st stress-sum-em · 6.40mm/px · 6 of 64 frames shown]
[frame 6/64]
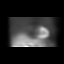
[frame 16/64]
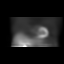
[frame 27/64]
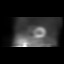
[frame 38/64]
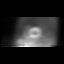
[frame 48/64]
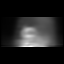
[frame 59/64]
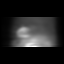

[48 of 48 positions shown; findings below may reference images not displayed]

Canned report from images found in remote index.

Refer to host system for actual result text.

## 2019-01-17 DIAGNOSIS — I10 Essential (primary) hypertension: Secondary | ICD-10-CM | POA: Diagnosis not present

## 2019-01-17 DIAGNOSIS — S0033XA Contusion of nose, initial encounter: Secondary | ICD-10-CM | POA: Diagnosis not present

## 2019-01-17 DIAGNOSIS — R4182 Altered mental status, unspecified: Secondary | ICD-10-CM | POA: Diagnosis not present

## 2019-01-18 DIAGNOSIS — S022XXA Fracture of nasal bones, initial encounter for closed fracture: Secondary | ICD-10-CM | POA: Diagnosis not present

## 2019-01-20 ENCOUNTER — Ambulatory Visit: Payer: Medicare Other | Admitting: Physician Assistant

## 2019-01-20 DIAGNOSIS — S022XXA Fracture of nasal bones, initial encounter for closed fracture: Secondary | ICD-10-CM | POA: Diagnosis not present

## 2019-01-23 DIAGNOSIS — I10 Essential (primary) hypertension: Secondary | ICD-10-CM | POA: Diagnosis not present

## 2019-01-23 DIAGNOSIS — Z6827 Body mass index (BMI) 27.0-27.9, adult: Secondary | ICD-10-CM | POA: Diagnosis not present

## 2019-01-23 DIAGNOSIS — E663 Overweight: Secondary | ICD-10-CM | POA: Diagnosis not present

## 2019-01-23 DIAGNOSIS — S022XXD Fracture of nasal bones, subsequent encounter for fracture with routine healing: Secondary | ICD-10-CM | POA: Diagnosis not present

## 2019-01-31 DIAGNOSIS — I129 Hypertensive chronic kidney disease with stage 1 through stage 4 chronic kidney disease, or unspecified chronic kidney disease: Secondary | ICD-10-CM | POA: Diagnosis not present

## 2019-02-07 DIAGNOSIS — I129 Hypertensive chronic kidney disease with stage 1 through stage 4 chronic kidney disease, or unspecified chronic kidney disease: Secondary | ICD-10-CM | POA: Diagnosis not present

## 2019-02-08 ENCOUNTER — Ambulatory Visit: Payer: Medicare Other | Admitting: Physician Assistant

## 2019-02-13 DIAGNOSIS — I129 Hypertensive chronic kidney disease with stage 1 through stage 4 chronic kidney disease, or unspecified chronic kidney disease: Secondary | ICD-10-CM | POA: Diagnosis not present

## 2019-02-13 DIAGNOSIS — N182 Chronic kidney disease, stage 2 (mild): Secondary | ICD-10-CM | POA: Diagnosis not present

## 2019-02-21 DIAGNOSIS — N182 Chronic kidney disease, stage 2 (mild): Secondary | ICD-10-CM | POA: Diagnosis not present

## 2019-02-21 DIAGNOSIS — I129 Hypertensive chronic kidney disease with stage 1 through stage 4 chronic kidney disease, or unspecified chronic kidney disease: Secondary | ICD-10-CM | POA: Diagnosis not present

## 2019-02-21 DIAGNOSIS — I10 Essential (primary) hypertension: Secondary | ICD-10-CM | POA: Diagnosis not present

## 2019-02-22 ENCOUNTER — Encounter: Payer: Self-pay | Admitting: Family Medicine

## 2019-02-22 DIAGNOSIS — N281 Cyst of kidney, acquired: Secondary | ICD-10-CM | POA: Diagnosis not present

## 2019-02-22 DIAGNOSIS — I10 Essential (primary) hypertension: Secondary | ICD-10-CM | POA: Diagnosis not present

## 2019-02-22 DIAGNOSIS — N182 Chronic kidney disease, stage 2 (mild): Secondary | ICD-10-CM | POA: Diagnosis not present

## 2019-03-07 DIAGNOSIS — I129 Hypertensive chronic kidney disease with stage 1 through stage 4 chronic kidney disease, or unspecified chronic kidney disease: Secondary | ICD-10-CM | POA: Diagnosis not present

## 2019-03-07 DIAGNOSIS — N183 Chronic kidney disease, stage 3 (moderate): Secondary | ICD-10-CM | POA: Diagnosis not present

## 2019-03-07 DIAGNOSIS — G8929 Other chronic pain: Secondary | ICD-10-CM | POA: Diagnosis not present

## 2019-03-07 DIAGNOSIS — E785 Hyperlipidemia, unspecified: Secondary | ICD-10-CM | POA: Diagnosis not present

## 2019-03-10 ENCOUNTER — Telehealth: Payer: Self-pay

## 2019-03-10 ENCOUNTER — Encounter: Payer: Self-pay | Admitting: Cardiology

## 2019-03-10 NOTE — Telephone Encounter (Signed)
New message:    Patient calling stated that he just spoke with [facility]. Please call patient back.

## 2019-03-10 NOTE — Telephone Encounter (Signed)
Error

## 2019-03-10 NOTE — Telephone Encounter (Signed)
TELEPHONE CALL NOTE  This patient has been deemed a candidate for follow-up tele-health visit to limit community exposure during the Covid-19 pandemic. I spoke with the patient via phone to discuss instructions. This has been outlined on the patient's AVS (dotphrase: hcevisitinfo). The patient was advised to review the section on consent for treatment as well. The patient will receive a phone call 2-3 days prior to their E-Visit at which time consent will be verbally confirmed. A Virtual Office Visit appointment type has been scheduled for 11 am on 03/10/19 with Dr. Radford Pax.  Sarina Ill, RN 03/10/2019 12:18 PM     Virtual Visit Pre-Appointment Phone Call  Steps For Call:  1. Confirm consent - "In the setting of the current Covid19 crisis, you are scheduled for a (phone or video) visit with your provider on (date) at (time).  Just as we do with many in-office visits, in order for you to participate in this visit, we must obtain consent.  If you'd like, I can send this to your mychart (if signed up) or email for you to review.  Otherwise, I can obtain your verbal consent now.  All virtual visits are billed to your insurance company just like a normal visit would be.  By agreeing to a virtual visit, we'd like you to understand that the technology does not allow for your provider to perform an examination, and thus may limit your provider's ability to fully assess your condition.  Finally, though the technology is pretty good, we cannot assure that it will always work on either your or our end, and in the setting of a video visit, we may have to convert it to a phone-only visit.  In either situation, we cannot ensure that we have a secure connection.  Are you willing to proceed?"  2. Give patient instructions for WebEx download to smartphone as below if video visit  3. Advise patient to be prepared with any vital sign or heart rhythm information, their current medicines, and a piece of paper  and pen handy for any instructions they may receive the day of their visit  4. Inform patient they will receive a phone call 15 minutes prior to their appointment time (may be from unknown caller ID) so they should be prepared to answer  5. Confirm that appointment type is correct in Epic appointment notes (video vs telephone)    TELEPHONE CALL NOTE  Marcus King has been deemed a candidate for a follow-up tele-health visit to limit community exposure during the Covid-19 pandemic. I spoke with the patient via phone to ensure availability of phone/video source, confirm preferred email & phone number, and discuss instructions and expectations.  I reminded Marcus King to be prepared with any vital sign and/or heart rhythm information that could potentially be obtained via home monitoring, at the time of his visit. I reminded Marcus King to expect a phone call at the time of his visit if his visit.  Did the patient verbally acknowledge consent to treatment? [place], RN 03/10/2019 12:19 PM   CONSENT FOR TELE-HEALTH VISIT - PLEASE REVIEW  I hereby voluntarily request, consent and authorize [facility] and its employed or contracted physicians, physician assistants, nurse practitioners or other licensed health care professionals (the Practitioner), to provide me with telemedicine health care services (the "Services") as deemed necessary by the treating Practitioner. I acknowledge and consent to receive the Services by the Practitioner via telemedicine. I understand that the telemedicine visit will involve  communicating with the Practitioner through live audiovisual communication technology and the disclosure of certain medical information by electronic transmission. I acknowledge that I have been given the opportunity to request an in-person assessment or other available alternative prior to the telemedicine visit and am voluntarily participating in the telemedicine visit.  I  understand that I have the right to withhold or withdraw my consent to the use of telemedicine in the course of my care at any time, without affecting my right to future care or treatment, and that the Practitioner or I may terminate the telemedicine visit at any time. I understand that I have the right to inspect all information obtained and/or recorded in the course of the telemedicine visit and may receive copies of available information for a reasonable fee.  I understand that some of the potential risks of receiving the Services via telemedicine include:  [name] Delay or interruption in medical evaluation due to technological equipment failure or disruption; . Information transmitted may not be sufficient (e.g. poor resolution of images) to allow for appropriate medical decision making by the Practitioner; and/or  . In rare instances, security protocols could fail, causing a breach of personal health information.  Furthermore, I acknowledge that it is my responsibility to provide information about my medical history, conditions and care that is complete and accurate to the best of my ability. I acknowledge that Practitioner's advice, recommendations, and/or decision may be based on factors not within their control, such as incomplete or inaccurate data provided by me or distortions of diagnostic images or specimens that may result from electronic transmissions. I understand that the practice of medicine is not an exact science and that Practitioner makes no warranties or guarantees regarding treatment outcomes. I acknowledge that I will receive a copy of this consent concurrently upon execution via email to the email address I last provided but may also request a printed copy by calling the office of [place].    I understand that my insurance will be billed for this visit.   I have read or had this consent read to me. . I understand the contents of this consent, which adequately explains the  benefits and risks of the Services being provided via telemedicine.  . I have been provided ample opportunity to ask questions regarding this consent and the Services and have had my questions answered to my satisfaction. . I give my informed consent for the services to be provided through the use of telemedicine in my medical care  By participating in this telemedicine visit I agree to the above.

## 2019-03-14 ENCOUNTER — Telehealth (INDEPENDENT_AMBULATORY_CARE_PROVIDER_SITE_OTHER): Payer: Medicare Other | Admitting: Cardiology

## 2019-03-14 ENCOUNTER — Other Ambulatory Visit: Payer: Self-pay

## 2019-03-14 ENCOUNTER — Telehealth: Payer: Self-pay | Admitting: Cardiology

## 2019-03-14 ENCOUNTER — Encounter: Payer: Self-pay | Admitting: Cardiology

## 2019-03-14 VITALS — BP 146/88 | HR 61 | Temp 97.8°F | Ht 70.0 in | Wt 188.0 lb

## 2019-03-14 DIAGNOSIS — I110 Hypertensive heart disease with heart failure: Secondary | ICD-10-CM

## 2019-03-14 DIAGNOSIS — I951 Orthostatic hypotension: Secondary | ICD-10-CM | POA: Diagnosis not present

## 2019-03-14 DIAGNOSIS — G4733 Obstructive sleep apnea (adult) (pediatric): Secondary | ICD-10-CM

## 2019-03-14 DIAGNOSIS — Z9989 Dependence on other enabling machines and devices: Secondary | ICD-10-CM | POA: Diagnosis not present

## 2019-03-14 DIAGNOSIS — I493 Ventricular premature depolarization: Secondary | ICD-10-CM

## 2019-03-14 NOTE — Telephone Encounter (Signed)
Pt was calling to see if Marcus King had sent new dosages of the medications (discussed at today's visit)  for him, and if new rx were sent to the pharmacy or not.

## 2019-03-14 NOTE — Progress Notes (Addendum)
Virtual Visit via Video Note    Evaluation Performed:  Follow-up visit  This visit type was conducted due to national recommendations for restrictions regarding the COVID-19 Pandemic (e.g. social distancing).  This format is felt to be most appropriate for this patient at this time.  All issues noted in this document were discussed and addressed.  No physical exam was performed (except for noted visual exam findings with Video Visits).  Please refer to the patient's chart (MyChart message for video visits and phone note for telephone visits) for the patient's consent to telehealth for [facility].  Date:  03/15/2019   ID:  Marcus King, DOB Feb 09, 1941, MRN [id number]  Patient Location:  Home  Provider location:   [place]  PCP:  Rochel Brome, MD  Cardiologist:  Fransico Him, MD  Electrophysiologist:  None   Chief Complaint:  Followup of orthostatic hypotension and hypertension  History of Present Illness:    Marcus King is a 78 y.o. male who presents via audio/video conferencing for a telehealth visit today.    Marcus King is a 78 y.o. male with a hx of HTN, anxiety, syncope secondary to orthostasis from dehydrationand OSA on CPAP. 2D echo showed mild LV dysfunction with EF 45-50% with diffuse HK and mildly dilated aortic root. Nuclear stress test showed no ischemia and EF 52%.Holter monitor showed sinus bradycardia, normal sinus rhythm and sinus tachycardia with an average heart rate of 70 bpm. His heart rate ranged from 55 to 115 bpm. There were occasional PVCs, ventricular couplets and triplets as well as bigeminal PVCs with a PVC load of 2%.   The patient does not have symptoms concerning for COVID-19 infection (fever, chills, cough, or new shortness of breath).    Prior CV studies:   The following studies were reviewed today:  none  Past Medical History:  Diagnosis Date  . Anxiety   . Chronic pain of left knee 08/11/2016  . Food poisoning   . Headache    sinus  . Hypertension   . Orthostatic hypotension   . OSA on CPAP   . Prostate cancer ([place])   . 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 CARDIAC CATHETERIZATION    . CATARACT EXTRACTION W/ INTRAOCULAR LENS  IMPLANT, BILATERAL Bilateral   . COLON SURGERY  2014  . COLOSTOMY REVERSAL  2014  . HERNIA REPAIR     x2  . JOINT REPLACEMENT    . PROSTATE SURGERY  2006  . SEPTOPLASTY    . TOTAL KNEE ARTHROPLASTY Left 09/28/2016   Procedure: TOTAL KNEE ARTHROPLASTY;  Surgeon: [name], MD;  Location: [place];  Service: Orthopedics;  Laterality: Left;     Current Meds  Medication Sig  . busPIRone (BUSPAR) 10 MG tablet Take 10 mg by mouth as needed (for anxiety).   . carvedilol (COREG) 25 MG tablet Take 25 mg by mouth 2 (two) times daily with a meal.  . cloNIDine (CATAPRES) 0.1 MG tablet Take 0.1 mg by mouth 2 (two) times daily.  . hydrALAZINE (APRESOLINE) 10 MG tablet Take 10 mg by mouth daily.  [name] lisinopril (PRINIVIL,ZESTRIL) 20 MG tablet Take 1 tablet (20 mg total) by mouth 2 (two) times daily.  [name] PARoxetine (PAXIL) 40 MG tablet Take 40 mg by mouth daily.   . pravastatin (PRAVACHOL) 10 MG tablet Take 10 mg by mouth daily.     Allergies:   Beef-derived products; Clonazepam; Monosodium glutamate; and  Pegademase bovine   Social History   Tobacco Use  . Smoking status: Former Smoker    Years: 20.00    Types: Cigarettes    Last attempt to quit: 1979    Years since quitting: 41.2  . Smokeless tobacco: Never Used  . Tobacco comment: quit 37 years ago  Substance Use Topics  . Alcohol use: No  . Drug use: No     Family Hx: The patient's family history includes Leukemia in his sister; Lung disease in his father; Stroke in his father and mother. There is no history of Alzheimer's disease.  ROS:   Please see the history of present illness.     All other systems reviewed and are negative.   Labs/Other  Tests and Data Reviewed:    Recent Labs: 01/09/2019: ALT 14; BUN 17; Creatinine, Ser 1.21; Hemoglobin 13.6; Platelets 255; Potassium 3.9; Sodium 137   Recent Lipid Panel No results found for: CHOL, TRIG, HDL, CHOLHDL, LDLCALC, LDLDIRECT  Wt Readings from Last 3 Encounters:  03/14/19 188 lb (85.3 kg)  01/02/19 186 lb 6.4 oz (84.6 kg)  10/26/18 185 lb (83.9 kg)     Objective:    Vital Signs:  BP (!) 146/88   Pulse 61   Temp 97.8 F (36.6 C)   Ht 5\' 10"  (1.778 m)   Wt 188 lb (85.3 kg)   BMI 26.98 kg/m    Well nourished, well developed male in no acute distress. Well appearing, alert and conversant, regular work of breathing,  good skin color  Eyes- anicteric mouth- oral mucosa is pink  neuro- grossly intact skin- no apparent rash or lesions or cyanosis   ASSESSMENT & PLAN:    1. Orthostatic hypotension - Unfortunately he is not a candidate for Florinef or ProAmatine because he has resting hypertension.  He has not been as compliant with compression hose as I would like him to be.  At his last OV  I switched him from knee-high to thigh-high compression hose stressed the importance of wearing these daily while he is up and around in the daytime.  AM cortisol level was normal. At last OV he was very orthostatic with SBP dropping more than 20 mm with a blunted heart rate response.  Beta-blocker suspected to be contributing to inability to increase heart rate in response to drops in blood pressure.    At last OV his carvedilol was decreased to 12.5 mg twice daily and I started him on hydralazine 10 mg 3 times daily as well as continued on clonidine 0.1 mg daily and his lisinopril 20 mg twice daily.  He was referred to Nephrology and saw Dr. Justin Mend who recommended changing Carvedilol to Labetolol, stopping Hydralazine, weaning off Clonidine and starting Amlodipine.  He has had LE edema on high doses of amlodipine so will only be able to tolerate 5mg  daily.    2.  HTN - His BPs are still  running on the high side ranging from 794-801KPVV systolic with HR 74-82LMB.  His BP was 140/87mmHg with HR 37bpm at nephrology OV.  Currently feeling pretty good but gets very wiped out for a few hours in the am after taking his meds.  I suspect that this is related to the Clonidine.   I have recommended the following:  Stop Hydralazine, start Amlodipine 5mg  daily, decrease Clondinine to 0.1mg  daily for 1 week then every other day for 1 week and then stop.  Change Carvedilol to Labetolol 200mg  BID.  I have asked him  to check his BP and HR daily for the next 2 weeks and call with the results.  I have asked him to call in the mean time if he has any problems.    3.  PVCs -he denies any significant palpitations.  We are changing him from Carvedilol to Labetolol.  4.  OSA - the patient is tolerating PAP therapy well without any problems.    COVID-19 Education: The signs and symptoms of COVID-19 were discussed with the patient and how to seek care for testing (follow up with PCP or arrange E-visit).  The importance of social distancing was discussed today.  Patient Risk:   After full review of this patient's clinical status, I feel that they are at least moderate risk at this time.  Time:   Today, I have spent 45 minutes with the patient with telehealth technology discussing at length his issues with his BP both low and high readings as well as medication side effects.  We discussed at length antihypertensive meds he has tried in the past as well as outlining a plan going forward with early followup.     Medication Adjustments/Labs and Tests Ordered: Current medicines are reviewed at length with the patient today.  Concerns regarding medicines are outlined above.  Tests Ordered: No orders of the defined types were placed in this encounter.  Medication Changes: No orders of the defined types were placed in this encounter.   Disposition:  Follow up in 3 week(s) virtual televisit  Signed,  Fransico Him, MD  03/15/2019 3:50 PM    [facility]

## 2019-03-15 ENCOUNTER — Encounter: Payer: Self-pay | Admitting: Cardiology

## 2019-03-15 MED ORDER — LABETALOL HCL 200 MG PO TABS: 200.0000 mg | ORAL_TABLET | Freq: Two times a day (BID) | ORAL | 11 refills | Status: DC

## 2019-03-15 MED ORDER — AMLODIPINE BESYLATE 5 MG PO TABS: 5.0000 mg | ORAL_TABLET | Freq: Every day | ORAL | 11 refills | Status: DC

## 2019-03-15 NOTE — Telephone Encounter (Signed)
Spoke with the patient, he expressed understanding about the changes from his video visit. He had no further questions.

## 2019-03-15 NOTE — Addendum Note (Signed)
Addended by: Sarina Ill on: 03/15/2019 04:16 PM   Modules accepted: Orders

## 2019-03-15 NOTE — Telephone Encounter (Signed)
  Patient needs the scripts for his new medications that were discussed in previous note to be sent to [facility]. Patient stated to give him a call if anything needs to further be discussed.

## 2019-03-15 NOTE — Telephone Encounter (Signed)
Spoke with the patient, advised that Dr. Radford Pax is still working on his medications and would let me know what to order soon.

## 2019-03-15 NOTE — Patient Instructions (Signed)
Medication Instructions:  Stop: Carvedilol Stop: Hydralazine Start: Labetalol 200 mg, twice a day, by mouth Start: Amlodipine 5 mg, daily, by mouth Change, then STOP: Decrease Clonidine to 0.1 mg, daily for 1 week, then decrease to 0.1 mg to every other day for 1 week, then STOP.  If you need a refill on your cardiac medications before your next appointment, please call your pharmacy.   Lab work: None If you have labs (blood work) drawn today and your tests are completely normal, you will receive your results only by: [name] MyChart Message (if you have MyChart) OR . A paper copy in the mail If you have any lab test that is abnormal or we need to change your treatment, we will call you to review the results.  Testing/Procedures: None  Follow-Up: Your physician recommends that you schedule a follow-up appointment in: 3 weeks with our office for a Tele-Visit with Dr. Radford Pax    Any Other Special Instructions Will Be Listed Below (If Applicable). Check blood pressure and heart rate everyday 1 hour after medication for 2 weeks and call the office with the results. 2

## 2019-03-22 DIAGNOSIS — R600 Localized edema: Secondary | ICD-10-CM | POA: Diagnosis not present

## 2019-03-22 DIAGNOSIS — I129 Hypertensive chronic kidney disease with stage 1 through stage 4 chronic kidney disease, or unspecified chronic kidney disease: Secondary | ICD-10-CM | POA: Diagnosis not present

## 2019-03-29 DIAGNOSIS — G8929 Other chronic pain: Secondary | ICD-10-CM | POA: Diagnosis not present

## 2019-03-29 DIAGNOSIS — N183 Chronic kidney disease, stage 3 (moderate): Secondary | ICD-10-CM | POA: Diagnosis not present

## 2019-03-29 DIAGNOSIS — I129 Hypertensive chronic kidney disease with stage 1 through stage 4 chronic kidney disease, or unspecified chronic kidney disease: Secondary | ICD-10-CM | POA: Diagnosis not present

## 2019-03-29 DIAGNOSIS — E785 Hyperlipidemia, unspecified: Secondary | ICD-10-CM | POA: Diagnosis not present

## 2019-04-05 DIAGNOSIS — I129 Hypertensive chronic kidney disease with stage 1 through stage 4 chronic kidney disease, or unspecified chronic kidney disease: Secondary | ICD-10-CM | POA: Diagnosis not present

## 2019-04-06 ENCOUNTER — Telehealth: Payer: Medicare Other | Admitting: Cardiology

## 2019-05-04 ENCOUNTER — Encounter: Payer: Medicare Other | Admitting: Psychology

## 2019-05-18 DIAGNOSIS — G542 Cervical root disorders, not elsewhere classified: Secondary | ICD-10-CM | POA: Diagnosis not present

## 2019-05-18 DIAGNOSIS — I10 Essential (primary) hypertension: Secondary | ICD-10-CM | POA: Diagnosis not present

## 2019-05-18 DIAGNOSIS — M4312 Spondylolisthesis, cervical region: Secondary | ICD-10-CM | POA: Diagnosis not present

## 2019-05-18 DIAGNOSIS — E782 Mixed hyperlipidemia: Secondary | ICD-10-CM | POA: Diagnosis not present

## 2019-05-18 DIAGNOSIS — R202 Paresthesia of skin: Secondary | ICD-10-CM | POA: Diagnosis not present

## 2019-05-18 DIAGNOSIS — M5003 Cervical disc disorder with myelopathy, cervicothoracic region: Secondary | ICD-10-CM | POA: Diagnosis not present

## 2019-05-18 DIAGNOSIS — M47812 Spondylosis without myelopathy or radiculopathy, cervical region: Secondary | ICD-10-CM | POA: Diagnosis not present

## 2019-05-25 ENCOUNTER — Encounter: Payer: Medicare Other | Admitting: Psychology

## 2019-05-31 ENCOUNTER — Other Ambulatory Visit: Payer: Self-pay | Admitting: Cardiology

## 2019-05-31 MED ORDER — AMLODIPINE BESYLATE 5 MG PO TABS: 5.0000 mg | ORAL_TABLET | Freq: Every day | ORAL | 2 refills | Status: DC

## 2019-07-06 DIAGNOSIS — L570 Actinic keratosis: Secondary | ICD-10-CM | POA: Diagnosis not present

## 2019-07-06 DIAGNOSIS — L821 Other seborrheic keratosis: Secondary | ICD-10-CM | POA: Diagnosis not present

## 2019-07-06 DIAGNOSIS — L578 Other skin changes due to chronic exposure to nonionizing radiation: Secondary | ICD-10-CM | POA: Diagnosis not present

## 2019-07-06 DIAGNOSIS — L300 Nummular dermatitis: Secondary | ICD-10-CM | POA: Diagnosis not present

## 2019-07-06 DIAGNOSIS — L820 Inflamed seborrheic keratosis: Secondary | ICD-10-CM | POA: Diagnosis not present

## 2019-08-23 DIAGNOSIS — G4733 Obstructive sleep apnea (adult) (pediatric): Secondary | ICD-10-CM | POA: Diagnosis not present

## 2019-08-23 DIAGNOSIS — Z23 Encounter for immunization: Secondary | ICD-10-CM | POA: Diagnosis not present

## 2019-08-23 DIAGNOSIS — I10 Essential (primary) hypertension: Secondary | ICD-10-CM | POA: Diagnosis not present

## 2019-08-23 DIAGNOSIS — E782 Mixed hyperlipidemia: Secondary | ICD-10-CM | POA: Diagnosis not present

## 2019-09-07 ENCOUNTER — Other Ambulatory Visit: Payer: Self-pay | Admitting: Cardiology

## 2019-09-14 ENCOUNTER — Ambulatory Visit (INDEPENDENT_AMBULATORY_CARE_PROVIDER_SITE_OTHER): Payer: Medicare Other | Admitting: Neurology

## 2019-09-14 ENCOUNTER — Other Ambulatory Visit: Payer: Self-pay

## 2019-09-14 ENCOUNTER — Encounter: Payer: Self-pay | Admitting: Neurology

## 2019-09-14 VITALS — BP 118/65 | HR 52 | Temp 98.0°F | Ht 70.0 in | Wt 193.0 lb

## 2019-09-14 DIAGNOSIS — I951 Orthostatic hypotension: Secondary | ICD-10-CM

## 2019-09-14 DIAGNOSIS — R7309 Other abnormal glucose: Secondary | ICD-10-CM | POA: Diagnosis not present

## 2019-09-14 DIAGNOSIS — G4733 Obstructive sleep apnea (adult) (pediatric): Secondary | ICD-10-CM

## 2019-09-14 DIAGNOSIS — Z9989 Dependence on other enabling machines and devices: Secondary | ICD-10-CM | POA: Diagnosis not present

## 2019-09-14 NOTE — Progress Notes (Signed)
SLEEP MEDICINE CLINIC    Provider:  Larey Seat, MD  Primary Care Physician:  Rochel Brome, MD [address] [place] [address]     Referring Provider: Rochel Brome, Md [address] [place],  [place] [address]          Chief Complaint according to patient   Patient presents with:    . New Patient (Initial Visit)     pt with wife, rm 68. over the last 2 yrs he has had some blood pressure concerns. he has been a CPAP user over 15 yrs      HISTORY OF PRESENT ILLNESS:  Marcus King is a 77 y.o. year old  Caucasian male patient seen here as upon referral on 09/14/2019 .  Chief concern according to patient : I haven't had a sleep study in 15 years but I am using CPAP. Current machine is 39 years old or more.    I have the pleasure of seeing Marcus King today, a right -handed Caucasian male with a history of OSA.  He has a medical history of Anxiety, Chronic pain of left knee (08/11/2016), Food poisoning, Headache, Hypertension, Orthostatic hypotension, OSA on CPAP, Prostate cancer ([place]), and S/P TKR (total knee replacement) using cement, left (09/28/2016). He has had uncontrolled  BP, had fainting spells.      Sleep relevant medical history: Nocturia after prostrate cancer , 4 times each night. Syncope- passing out many, many times. Has fallen on his face, has broken his nose. Bradycardia. Sleep walking; none , Tonsillectomy at age 89.   Family medical /sleep history: no  other family member on CPAP with a diagnosis of  OSA. father was alcoholic.   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 Family status is married , with  2 adult children, a daughter is a Therapist, sports. Marland KitchenPets are not  present. Tobacco use- quit 40 years ago after 20 years. ETOH use never - Caffeine intake in form of Coffee( 2 cups ) Soda( rare ) Tea ( rare ), nor energy drinks.  Regular exercise - none    Hobbies : yard work   Sleep habits are as follows: The patient's dinner time is between 5-6 PM.  There are snacks before bed time. The patient goes to bed at 11 PM and  Goes to sleep promptly- continues to sleep for 2-3 hours, wakes for many bathroom breaks.   The preferred sleep position is prone with extended arm. , with the support of 1 pillow. He is sleeping in a different room. Dreaming reportedly 50 % of night.   7.30 AM is the usual rise time. The patient wakes up spontaneously.[name]  He reports not feeling refreshed or restored in AM, he feels groggy in AM since he tried on many HTN medication. withot symptoms such as dry mouth, rare morning headaches-, and residual fatigue.  Naps are taken daily /frequently, lasting 60 minutes after yard work and feels refreshed.    Review of Systems: Out of a complete 14 system review, the patient complains of only the following symptoms, and all other reviewed systems are negative.:  Fatigue, sleepiness , no snoring on current CPAP interface , fragmented sleep,NOCTURIA>  He snores when napping.    How likely are you to doze in the following situations: 0 = not likely, 1 = slight  chance, 2 = moderate chance, 3 = high chance   Sitting and Reading? 1 Watching Television? 1 Sitting inactive in a public place (theater or meeting)?1 As a passenger in a car for an hour without a break?0 Lying down in the afternoon when circumstances permit?3 Sitting and talking to someone?0 Sitting quietly after lunch without alcohol?0 In a car, while stopped for a few minutes in traffic?0   Total = 6/ 24 points   FSS endorsed at N/A-63 points.   Social History   Socioeconomic History  . Marital status: Married    Spouse name: Not on file  . Number of children: 2  . Years of education: Not on file  . Highest education level: High school graduate  Occupational History  . Not on file  Social Needs  . Financial resource strain: Not on  file  . Food insecurity    Worry: Not on file    Inability: Not on file  . Transportation needs    Medical: Not on file    Non-medical: Not on file  Tobacco Use  . Smoking status: Former Smoker    Years: 20.00    Types: Cigarettes    Quit date: 1979    Years since quitting: 41.7  . Smokeless tobacco: Never Used  . Tobacco comment: quit 37 years ago  Substance and Sexual Activity  . Alcohol use: No  . Drug use: No  . Sexual activity: Not on file  Lifestyle  . Physical activity    Days per week: Not on file    Minutes per session: Not on file  . Stress: Not on file  Relationships  . Social Herbalist on phone: Not on file    Gets together: Not on file    Attends religious service: Not on file    Active member of club or organization: Not on file    Attends meetings of clubs or organizations: Not on file    Relationship status: Not on file  Other Topics Concern  . Not on file  Social History Narrative   LIves at home with his wife   Self employed   Right handed   Drinks about 2 cups of caffeine daily    Family History  Problem Relation Age of Onset  . Stroke Mother   . Lung disease Father   . Stroke Father   . Leukemia Sister   . Alzheimer's disease Neg Hx     Past Medical History:  Diagnosis Date  . Anxiety   . Chronic pain of left knee 08/11/2016  . Food poisoning   . Headache    sinus  . Hypertension   . Orthostatic hypotension   . OSA on CPAP   . Prostate cancer ([place])   . 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 CARDIAC CATHETERIZATION    . CATARACT EXTRACTION W/ INTRAOCULAR LENS  IMPLANT, BILATERAL Bilateral   . COLON SURGERY  2014  . COLOSTOMY REVERSAL  2014  . HERNIA REPAIR     x2  . JOINT REPLACEMENT    . PROSTATE SURGERY  2006  . SEPTOPLASTY    . TOTAL KNEE ARTHROPLASTY Left 09/28/2016   Procedure: TOTAL KNEE ARTHROPLASTY;  Surgeon: [name], MD;   Location: [place];  Service: Orthopedics;  Laterality: Left;     Current Outpatient Medications on File Prior to Visit  Medication Sig Dispense Refill  .  amLODipine (NORVASC) 5 MG tablet Take 1 tablet (5 mg total) by mouth daily. 90 tablet 2  . busPIRone (BUSPAR) 10 MG tablet Take 10 mg by mouth as needed (for anxiety).     . labetalol (NORMODYNE) 200 MG tablet Take 1 tablet (200 mg total) by mouth 2 (two) times daily. 60 tablet 11  . lisinopril (PRINIVIL,ZESTRIL) 20 MG tablet Take 1 tablet (20 mg total) by mouth 2 (two) times daily. 180 tablet 3  . PARoxetine (PAXIL) 40 MG tablet Take 40 mg by mouth daily.     . pravastatin (PRAVACHOL) 10 MG tablet Take 10 mg by mouth daily.     No current facility-administered medications on file prior to visit.     Allergies  Allergen Reactions  . Beef-Derived Products Diarrhea  . Clonazepam Other (See Comments)    High doses cause drunk feeling (>1 mg)  . Monosodium Glutamate Other (See Comments)    MSG (soups and oriental foods)  . Pegademase Bovine Diarrhea    Physical exam:  Today's Vitals   09/14/19 1301  BP: 118/65  Pulse: (!) 52  Temp: 98 F (36.7 C)  Weight: 193 lb (87.5 kg)  Height: 5\' 10"  (1.778 m)   Body mass index is 27.69 kg/m.   Wt Readings from Last 3 Encounters:  09/14/19 193 lb (87.5 kg)  03/14/19 188 lb (85.3 kg)  01/02/19 186 lb 6.4 oz (84.6 kg)     Ht Readings from Last 3 Encounters:  09/14/19 5\' 10"  (1.778 m)  03/14/19 5\' 10"  (1.778 m)  01/02/19 5\' 10"  (1.778 m)      General: The patient is awake, alert and appears not in acute distress. The patient is well groomed. Head: Normocephalic, atraumatic. Neck is supple. Mallampati  3, macroglossia. neck circumference:17 inches . Nasal airflow  patent. Recently broken nose.  Retrognathia is not  seen.  Dental status: full dentures Cardiovascular:  Regular rate and cardiac rhythm by pulse,  without distended neck veins. Respiratory: Lungs are clear to  auscultation.  Skin:  Without evidence of ankle edema, or rash. Trunk: The patient's posture is erect.   Neurologic exam : The patient is awake and alert, oriented to place and time.   Memory subjective described as intact.  Attention span & concentration ability appears normal.  Speech is fluent,  without  dysarthria, dysphonia or aphasia.  Mood and affect are appropriate.   Cranial nerves: no loss of smell or taste reported  Pupils are equal and briskly reactive to light. Funduscopic exam deferred. .  Extraocular movements in vertical and horizontal planes were intact and without nystagmus. No Diplopia. Visual fields by finger perimetry are intact.Hearing was intact to soft voice and finger rubbing.  Facial sensation intact to fine touch. Facial motor strength is symmetric and tongue and uvula move midline.  Neck ROM : rotation, tilt and flexion extension were normal for age and shoulder shrug was symmetrical.    Motor exam:  Symmetric bulk, tone and ROM.   Normal tone without cog wheeling, symmetric grip strength .  Sensory:  Fine touch, pinprick and vibration were tested  and  normal.  Proprioception tested in the upper extremities was normal.  Coordination: Rapid alternating movements in the fingers/hands were of normal speed.  The Finger-to-nose maneuver was intact without evidence of ataxia, dysmetria or tremor. Gait and station: Patient could rise unassisted from a seated position, walked without assistive device.  Stance is of normal width/ base and the patient turned with 3 steps.  Toe and heel walk were deferred.  Deep tendon reflexes: in the  upper and lower extremities are attenuated.         After spending a total time of 45 minutes face to face and additional time for physical and neurologic examination, review of laboratory studies,  personal review of imaging studies, reports and results of other testing and review of referral information / records as far as provided  in visit, I have established the following assessments:  Marcus King is seen here today to reevaluate his current state of sleep apnea.  He has a very remote history of cigarette smoking but he has developed over the last month maybe a year or more unstable blood pressure and has sometimes responded paradoxically to medication having very low and very high blood pressures at times, and in combination with bradycardia had even fainting spells and falls.  His current CPAP is an S9 AutoSet by [employer] this machine has never been specified to his needs and is literally on the fact factory settings.  It allows it allows for pressure between 5 and 20 cmH2O this 2 cm expiratory pressure relief, he uses it compliantly 100% of the time a review of the last 30 days ending last night on 30 September shows an average user time of 7 hours 29 minutes.  His pressure needed at the 95th percentile is 12.5 cmH2O and his residual AHI is 4.5 the residual apneas interestingly are all obstructive in nature even that there is room to go to his upper settings.  He does have significant air leakage and this may have led to apneas being erroneously counted.  Overall he has always felt that CPAP helped him to get better sleep and has not had an effect on his nocturia which seems to be related primarily to prostate cancer.  At this time I will order a repeat sleep study to evaluate the current degree of apnea and also to see if there is associated hypoxemia.  I would prefer an attended sleep study but I will leave it up to his Medicare insurance.  I will therefore order both the home sleep test and the attended sleep study depending on coverage.  The patient resides in [place] and does have a long drive.    My Plan is to proceed with:  Patient would prefer HST but attended sleep study will allow for more exact data in relation to hypoxemia.  I would like to thank Cox, Elnita Maxwell, MD and a for allowing me to meet with and to take care  of this pleasant patient.   In short, Marcus King is presenting with  OSA being a long time CPAP user, unstable hypertension and memory changes.  I plan to follow up either personally or through our NP within 2-3 month.   CC: I will share my notes with PCP>   Electronically signed by: Larey Seat, MD 09/14/2019 1:14 PM  [facility] [place] certified by The [employer] of Sleep Medicine and Diplomate of the [employer] of Sleep Medicine. Board certified In Neurology through the [employer], Fellow of the [employer] of Neurology. Medical Director of [employer].

## 2019-10-09 ENCOUNTER — Ambulatory Visit (INDEPENDENT_AMBULATORY_CARE_PROVIDER_SITE_OTHER): Payer: Medicare Other | Admitting: Neurology

## 2019-10-09 DIAGNOSIS — R7309 Other abnormal glucose: Secondary | ICD-10-CM

## 2019-10-09 DIAGNOSIS — G4734 Idiopathic sleep related nonobstructive alveolar hypoventilation: Secondary | ICD-10-CM

## 2019-10-09 DIAGNOSIS — I951 Orthostatic hypotension: Secondary | ICD-10-CM

## 2019-10-09 DIAGNOSIS — G4733 Obstructive sleep apnea (adult) (pediatric): Secondary | ICD-10-CM | POA: Diagnosis not present

## 2019-10-09 DIAGNOSIS — G4731 Primary central sleep apnea: Secondary | ICD-10-CM

## 2019-10-17 DIAGNOSIS — R7309 Other abnormal glucose: Secondary | ICD-10-CM | POA: Insufficient documentation

## 2019-10-17 DIAGNOSIS — I951 Orthostatic hypotension: Secondary | ICD-10-CM | POA: Insufficient documentation

## 2019-10-17 DIAGNOSIS — R0902 Hypoxemia: Secondary | ICD-10-CM | POA: Insufficient documentation

## 2019-10-17 DIAGNOSIS — G4731 Primary central sleep apnea: Secondary | ICD-10-CM

## 2019-10-17 DIAGNOSIS — G4739 Other sleep apnea: Secondary | ICD-10-CM

## 2019-10-17 DIAGNOSIS — G4734 Idiopathic sleep related nonobstructive alveolar hypoventilation: Secondary | ICD-10-CM | POA: Insufficient documentation

## 2019-10-17 HISTORY — DX: Hypoxemia: R09.02

## 2019-10-17 HISTORY — DX: Primary central sleep apnea: G47.31

## 2019-10-17 HISTORY — DX: Other sleep apnea: G47.39

## 2019-10-17 HISTORY — DX: Orthostatic hypotension: I95.1

## 2019-10-17 HISTORY — DX: Other abnormal glucose: R73.09

## 2019-10-17 NOTE — Procedures (Signed)
PATIENT'S NAME:  Marcus King, Marcus King DOB:      [birth year]      MRN:    [id number]    DATE OF RECORDING: 10/09/2019 REFERRING M.D.:  Dirk Dress, MD Study Performed:   Baseline Polysomnogram HISTORY:  Marcus King is a 78 y.o. year old Caucasian male patient seen on 09/14/2019. Chief concern according to patient: "I haven't had a sleep study in 15 years but I am using CPAP. Current machine is 48 years old or more". He is 100% compliant at 95% pressure of 12.5 cm water.  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 He has had uncontrolled BP, had fainting spells.  Sleep relevant medical history: Nocturia after prostate cancer, 4 times each night. Syncope- passing out many, many times. Has fallen on his face, has broken his nose. Bradycardia. Tonsillectomy at age 82.  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  Naps are taken daily /frequently, lasting 60 minutes after yard work and feel refreshing.    The patient endorsed the Epworth Sleepiness Scale at 6/24 points.   The patient's weight 193 pounds with a height of 70 (inches), resulting in a BMI of 27.8 kg/m2. The patient's neck circumference measured 17 inches.  CURRENT MEDICATIONS: Norvasc, Buspar, Normodyne, Prinivil, Paxil, Pravachol.   PROCEDURE:  This is a multichannel digital polysomnogram utilizing the Somnostar 11.2 system.  Electrodes and sensors were applied and monitored per AASM Specifications.   EEG, EOG, Chin and Limb EMG, were sampled at 200 Hz.  ECG, Snore and Nasal  Pressure, Thermal Airflow, Respiratory Effort, CPAP Flow and Pressure, Oximetry was sampled at 50 Hz. Digital video and audio were recorded.      BASELINE STUDY: Lights Out was at 21:20 and Lights On at 04:57.  Total recording time (TRT) was 457.5 minutes, with a total sleep time (TST) of 264.5 minutes.   The patient's sleep latency was 82.5 minutes.  REM latency was 0 minutes.  The sleep efficiency was 57.8 %.     SLEEP ARCHITECTURE: WASO (Wake after sleep onset) was 113 minutes. There were 27 minutes in Stage N1, 237.5 minutes Stage N2, 0 minutes Stage N3 and 0 minutes in Stage REM. The percentage of Stage N1 was 10.2%, Stage N2 was 89.8%, Stage N3 was 0% and Stage R (REM sleep) was 0%.  The arousals were noted as: 115 were spontaneous, 22 were associated with PLMs, and 143 were associated with respiratory events.   RESPIRATORY ANALYSIS:  There were a total of 204 respiratory events:  40 obstructive apneas, 100 central apneas and 6 mixed apneas with a total of 146 apneas and an apnea index (AI) of 33.1 /hour. There were 58 hypopneas with a hypopnea index of 13.2 /hour.    The total APNEA/HYPOPNEA INDEX (AHI) was 46.3/hour.  No REM sleep was recorded and 222 events in NREM, with a non-REM AHI of 46.3/h. The patient spent 71.5 minutes of total sleep time in the supine position and 193 minutes in non-supine. The supine AHI was 96.5 versus a non-supine AHI of 27.6.  OXYGEN SATURATION & C02:  The Wake baseline 02 saturation was 95%, with the lowest being 81%. Time spent below 89% saturation equaled 52 minutes.   PERIODIC LIMB MOVEMENTS:  The patient had a total of 30 Periodic Limb Movements.  The Periodic Limb Movement (PLM) index was 6.8 and the PLM Arousal index was 0.5./hour. Sleep talking was noted.  The patient took five (5) bathroom breaks. EKG was in keeping with regular sinus rhythm and isolated extra-systoles'.   IMPRESSION:  1. Central Sleep Apnea(CSA), severe. 2. 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 Sleep talking 4. Extremely fragmented sleep -Dysfunctions associated with sleep stages or arousal from sleep    RECOMMENDATIONS:  1. Advise full-night, attended, PAP titration study to optimize therapy.  The patient has complex, mostly central apneas and autotitration will not be sufficient.    I certify that I have reviewed the entire raw data recording prior to the issuance of this report in accordance with the Standards of Accreditation of the American Academy of Sleep Medicine (AASM)  Larey Seat, MD  10-17-2019 Diplomat, American Board of Psychiatry and Neurology  Diplomat, American Board of [place] Director, [facility] [employer]

## 2019-10-17 NOTE — Addendum Note (Signed)
Addended by: Larey Seat on: 10/17/2019 02:48 PM   Modules accepted: Orders

## 2019-10-18 ENCOUNTER — Telehealth: Payer: Self-pay | Admitting: Neurology

## 2019-10-18 NOTE — Telephone Encounter (Signed)

## 2019-10-18 NOTE — Telephone Encounter (Signed)
-----   Message from Larey Seat, MD sent at 10/17/2019  2:48 PM EST ----- The patient had a total of 30 Periodic Limb Movements.  The Periodic Limb Movement (PLM) index was 6.8 and the PLM Arousal index was 0.5./hour. Sleep talking was noted.  The patient took five (5) bathroom breaks. EKG was in keeping with regular sinus rhythm and isolated extra-systoles'.   IMPRESSION:  Central Sleep Apnea(CSA), severe. Mild - moderate Periodic Limb Movement Disorder (PLMD)  Sleep talking Extremely fragmented sleep -Dysfunctions associated with sleep stages or arousal from sleep    RECOMMENDATIONS:  Advise full-night, attended, PAP titration study to optimize therapy.  The patient has complex, mostly central apneas and autotitration will not be sufficient.    I certify that I have reviewed the entire raw data recording prior to the issuance of this report in accordance with the Standards of Accreditation of the [place] [employer] of [employer] Medicine (AASM)  Larey Seat, MD  10-17-2019  Cc Dr Tobie Poet, [facility]

## 2019-10-18 NOTE — Telephone Encounter (Signed)
-----   Message from Larey Seat, MD sent at 10/17/2019  2:48 PM EST ----- The patient had a total of 30 Periodic Limb Movements.  The Periodic Limb Movement (PLM) index was 6.8 and the PLM Arousal index was 0.5./hour. Sleep talking was noted.  The patient took five (5) bathroom breaks. EKG was in keeping with regular sinus rhythm and isolated extra-systoles'.   IMPRESSION:  Central Sleep Apnea(CSA), severe. Mild - moderate Periodic Limb Movement Disorder (PLMD)  Sleep talking Extremely fragmented sleep -Dysfunctions associated with sleep stages or arousal from sleep    RECOMMENDATIONS:  Advise full-night, attended, PAP titration study to optimize therapy.  The patient has complex, mostly central apneas and autotitration will not be sufficient.    I certify that I have reviewed the entire raw data recording prior to the issuance of this report in accordance with the Standards of Accreditation of the [place] Academy of Sleep Medicine (AASM)  Larey Seat, MD  10-17-2019  Cc Dr Tobie Poet, [facility]

## 2019-10-23 DIAGNOSIS — L300 Nummular dermatitis: Secondary | ICD-10-CM | POA: Diagnosis not present

## 2019-10-23 DIAGNOSIS — D485 Neoplasm of uncertain behavior of skin: Secondary | ICD-10-CM | POA: Diagnosis not present

## 2019-10-27 ENCOUNTER — Other Ambulatory Visit (HOSPITAL_COMMUNITY)
Admission: RE | Admit: 2019-10-27 | Discharge: 2019-10-27 | Disposition: A | Discharge status: Home / Self Care | Payer: Medicare Other | Source: Ambulatory Visit | Attending: Neurology | Admitting: Neurology

## 2019-10-27 DIAGNOSIS — Z20828 Contact with and (suspected) exposure to other viral communicable diseases: Secondary | ICD-10-CM | POA: Diagnosis not present

## 2019-10-27 DIAGNOSIS — Z01812 Encounter for preprocedural laboratory examination: Secondary | ICD-10-CM | POA: Diagnosis not present

## 2019-10-29 LAB — NOVEL CORONAVIRUS, NAA (HOSP ORDER, SEND-OUT TO REF LAB; TAT 18-24 HRS): SARS-CoV-2, NAA: NOT DETECTED

## 2019-10-31 ENCOUNTER — Ambulatory Visit (INDEPENDENT_AMBULATORY_CARE_PROVIDER_SITE_OTHER): Payer: Medicare Other | Admitting: Neurology

## 2019-10-31 DIAGNOSIS — G4731 Primary central sleep apnea: Secondary | ICD-10-CM

## 2019-10-31 DIAGNOSIS — Z9989 Dependence on other enabling machines and devices: Secondary | ICD-10-CM

## 2019-10-31 DIAGNOSIS — I951 Orthostatic hypotension: Secondary | ICD-10-CM

## 2019-10-31 DIAGNOSIS — G4734 Idiopathic sleep related nonobstructive alveolar hypoventilation: Secondary | ICD-10-CM

## 2019-11-08 ENCOUNTER — Telehealth: Payer: Self-pay

## 2019-11-08 ENCOUNTER — Other Ambulatory Visit: Payer: Self-pay

## 2019-11-08 NOTE — Addendum Note (Signed)
Addended by: Larey Seat on: 11/08/2019 12:45 PM   Modules accepted: Orders

## 2019-11-08 NOTE — Telephone Encounter (Signed)
I called pt. I advised pt that Dr. Brett Fairy reviewed their sleep study results and found that pt did best with a bipap. Dr. Brett Fairy recommends that pt start a bipap at home. I reviewed PAP compliance expectations with the pt. Pt is agreeable to starting a BiPAP. I advised pt that an order will be sent to a DME, [facility], and [facility] will call the pt within about one week after they file with the pt's insurance. [facility] will show the pt how to use the machine, fit for masks, and troubleshoot the BiPAP if needed. A follow up appt was made for insurance purposes with Jinny Blossom, NP on 2/11/21at 10:30am Pt verbalized understanding to arrive 15 minutes early and bring their BiPAP. A letter with all of this information in it will be mailed to the pt as a reminder. I verified with the pt that the address we have on file is correct. Pt verbalized understanding of results. Pt had no questions at this time but was encouraged to call back if questions arise. I have sent the order to [facility] and have received confirmation that they have received the order.

## 2019-11-08 NOTE — Telephone Encounter (Signed)
-----   Message from Larey Seat, MD sent at 11/08/2019 12:45 PM EST ----- DIAGNOSIS  1. Complex-Central Sleep Apnea responded better to BiPAP at 15/  11 cm than any other pressure applied. CPAP has caused central  apneas to emerge.  2. PLMs were most frequent under CPAP, and decreased as a cause  for arousals under BiPAP.   3. Sleep Related Hypoxemia improved.   PLANS/RECOMMENDATIONS: The patient was fitted with a ResMed Mirage Quattro Medium sized  Full-face mask but prefers a nasal pillow mask at home.  The patient can use any interface he likes, we will start on a  BiPAP auto-machine with a pressure window between 10/6 and 17/ 13  cm water. 4 cm spread in pressure. No additional oxygen is  needed.  PLMs may need to be addressed with medication.  RN- please send a CC to Dr. Tobie Poet in [place], please

## 2019-11-08 NOTE — Procedures (Signed)
PATIENT'S NAME:  Marcus King, Marcus King DOB:      1941-07-13      MR#:    [id number]     DATE OF RECORDING: 10/31/2019  CGA REFERRING M.D.:  / Debbora Presto NP- Dirk Dress, MD Study Performed:   Titration to CPAP and BiPAP  This Patient is returning for a CPAP Titration sleep study, after having a polysomnography on 10/09/19. Patient had an AHI of 46.3/h, supine AHI of 96.5/h, and an oxygen nadir of 81%, for 52 minutes. The patient has been an established CPAP user- 100% compliant for 30 days at 12.5 cm water pressure in the 95% percentile. Diagnosis of the PSG was that of Central Sleep Apnea(CSA), severe. Mild - moderate Periodic Limb Movement Disorder (PLMD), Sleep talking and extremely fragmented sleep, Dysfunctions associated with sleep stages or arousal from sleep The patient endorsed the Epworth Sleepiness Scale at 6/24 points, under consideration of daily naps.   The patient's weight 193 pounds with a height of 70 (inches), resulting in a BMI of 27.8 kg/m2. The patient's neck circumference measured 17 inches. Clinical concern was high blood pressure while compliantly using CPAP.   CURRENT MEDICATIONS: Norvasc, Buspar, Normodyne, Prinivil, Paxil, Pravachol.  PROCEDURE:  This is a multichannel digital polysomnogram utilizing the SomnoStar 11.2 system.  Electrodes and sensors were applied and monitored per AASM Specifications.   EEG, EOG, Chin and Limb EMG, were sampled at 200 Hz.  ECG, Snore and Nasal Pressure, Thermal Airflow, Respiratory Effort, CPAP Flow and Pressure, Oximetry was sampled at 50 Hz. Digital video and audio were recorded.      CPAP was initiated at 5 cmH20 with heated humidity per AASM split night standards and pressure was advanced to 11 cmH20 because of hypopneas, apneas and desaturations. The AHI was 60/h at this last explored CPAP pressure- the patient was changed to BiPAP , beginning at 12/8 cm water pressure.  At the last explored BIPAP pressure of 15/11 cmH20, there was a reduction  of the AHI to 3.9/h with improvement of sleep apnea and PLMs. Lights Out was at 22:02 and Lights On at 04:55. Total recording time (TRT) was 413.5 minutes, with a total sleep time (TST) of 295 minutes. The patient's sleep latency was 24 minutes. REM latency was 0 minutes.  The sleep efficiency was 71.3 %.    SLEEP ARCHITECTURE: WASO (Wake after sleep onset)  was 82 minutes.  There were 41 minutes in Stage N1, 239 minutes Stage N2, 15 minutes Stage N3 and 0 minutes in Stage REM.  The percentage of Stage N1 was 13.9%, Stage N2 was 81.%, Stage N3 was 5.1% and Stage R (REM sleep) was 0.0 %.   RESPIRATORY ANALYSIS:  There was a total of 90 respiratory events: 12 obstructive apneas, 40 central apneas and 19 mixed apneas with a total of 71 apneas and an apnea index (AI) of 14.4 /hour. There were 19 hypopneas with a hypopnea index of 3.9/hour. The patient also had 0 respiratory event related arousals (RERAs).     The total APNEA/HYPOPNEA INDEX (AHI) was 18.3 /hour and the total RESPIRATORY DISTURBANCE INDEX was 18.3 /hour.  0 events occurred in REM sleep and 90 events in NREM. The REM AHI was 0.0 /hour versus a non-REM AHI of 18.3 /hour.  The patient spent 78 minutes of total sleep time in the supine position and 217 minutes in non-supine. The supine AHI was 58.5/h, versus a non-supine AHI of 3.8.  OXYGEN SATURATION & C02:  The baseline 02 saturation was  94%, with the lowest being 77%. Time spent below 89% saturation equaled 14 minutes.  The arousals were noted as: 55 were spontaneous, 144 were associated with PLMs, 54 were associated with respiratory events. The patient had a total of 239 Periodic Limb Movements. The Periodic Limb Movement (PLM) index was 48.6 and the PLM Arousal index was 29.3 /hour. Audio and video analysis did not show any abnormal or unusual movements, behaviors, phonations or vocalizations.   The patient was fitted with a ResMed Mirage Quattro Medium sized Full-face mask but prefers a  nasal pillow mask at home.  DIAGNOSIS 1. Complex-Central Sleep Apnea responded better to BiPAP at 15/ 11 cm than any other pressure applied. CPAP has caused central apneas to emerge.  2. PLMs were most frequent under CPAP, and decreased as a cause for arousals under BiPAP.   3. Sleep Related Hypoxemia improved.   PLANS/RECOMMENDATIONS: The patient can use any interface he likes, we will start on a BiPAP auto-machine with a pressure window between 10/6 and 17/ 13 cm water. 4 cm spread in pressure. No additional oxygen is needed.  PLMs may need to be addressed with medication.   A follow up appointment will be scheduled in the Sleep Clinic at [facility].   Please call [phone] with any questions.     I certify that I have reviewed the entire raw data recording prior to the issuance of this report in accordance with the Standards of Accreditation of the American Academy of Sleep Medicine (AASM)   Larey Seat, M.D.   11-07-2019 Diplomat, American Board of Psychiatry and Neurology  Diplomat, [place] of Sleep Medicine Medical Director, [place] [facility] [place]

## 2019-11-15 ENCOUNTER — Telehealth: Payer: Self-pay | Admitting: Neurology

## 2019-11-15 NOTE — Telephone Encounter (Signed)
Re-faxed order to number provided. Received confirmation.

## 2019-11-15 NOTE — Telephone Encounter (Signed)
Called pt back and advised when he spoke with KD,RN last week on 11/08/2019 they sent orders to [facility] to get him started on the bipap machine. He has been a pt of theirs for 10-15 years. Instructed him to call them back at [phone] to check on status of getting started on BIPAP. He verbalized understanding.

## 2019-11-15 NOTE — Telephone Encounter (Signed)
[email or web address] has called asking the order for the Bipap machine be refaxed to her at [phone]

## 2019-11-15 NOTE — Telephone Encounter (Signed)
Patient called stating now that they have had both the Sleep hydration and cpap study and the results have come back to his underst anding a bypap was needed due to those results and would like to know what the next step is for a bypap.    Please follow up.

## 2019-11-24 DIAGNOSIS — F411 Generalized anxiety disorder: Secondary | ICD-10-CM | POA: Diagnosis not present

## 2019-11-24 DIAGNOSIS — Z0000 Encounter for general adult medical examination without abnormal findings: Secondary | ICD-10-CM | POA: Diagnosis not present

## 2019-11-24 DIAGNOSIS — G4733 Obstructive sleep apnea (adult) (pediatric): Secondary | ICD-10-CM | POA: Diagnosis not present

## 2019-11-24 DIAGNOSIS — I10 Essential (primary) hypertension: Secondary | ICD-10-CM | POA: Diagnosis not present

## 2019-11-24 DIAGNOSIS — E782 Mixed hyperlipidemia: Secondary | ICD-10-CM | POA: Diagnosis not present

## 2020-01-01 ENCOUNTER — Other Ambulatory Visit: Payer: Self-pay | Admitting: Physician Assistant

## 2020-01-08 DIAGNOSIS — L300 Nummular dermatitis: Secondary | ICD-10-CM | POA: Diagnosis not present

## 2020-01-08 DIAGNOSIS — L578 Other skin changes due to chronic exposure to nonionizing radiation: Secondary | ICD-10-CM | POA: Diagnosis not present

## 2020-01-08 DIAGNOSIS — L821 Other seborrheic keratosis: Secondary | ICD-10-CM | POA: Diagnosis not present

## 2020-01-19 ENCOUNTER — Other Ambulatory Visit: Payer: Self-pay | Admitting: Family Medicine

## 2020-01-23 ENCOUNTER — Other Ambulatory Visit: Payer: Self-pay | Admitting: Family Medicine

## 2020-01-25 ENCOUNTER — Other Ambulatory Visit: Payer: Self-pay

## 2020-01-25 ENCOUNTER — Encounter: Payer: Self-pay | Admitting: Adult Health

## 2020-01-25 ENCOUNTER — Ambulatory Visit (INDEPENDENT_AMBULATORY_CARE_PROVIDER_SITE_OTHER): Payer: Medicare Other | Admitting: Adult Health

## 2020-01-25 VITALS — BP 132/86 | HR 63 | Temp 97.2°F | Ht 70.0 in | Wt 187.8 lb

## 2020-01-25 DIAGNOSIS — G4733 Obstructive sleep apnea (adult) (pediatric): Secondary | ICD-10-CM

## 2020-01-25 NOTE — Progress Notes (Signed)
PATIENT: Marcus King DOB: 1941-01-05  REASON FOR VISIT: follow up HISTORY FROM: patient  HISTORY OF PRESENT ILLNESS: Today 01/25/20:  Marcus King is a 79 year old male with a history of obstructive sleep apnea on BiPAP.  He returns today for follow-up.  His download indicates that he uses machine nightly for compliance of 100%.  He uses machine greater than 4 hours-- 6 hours and 59 minutes.  His residual AHI is 8.7 auto bipap.  Patient reports that the machine is working fairly well for him.  He is not particularly happy with his DME company.  He returns today for an evaluation.  HISTORY (Copied from Dr.Dohmeier's note) Marcus King a 79 y.o. year old  Caucasian male patientseen here as upon referralon 09/14/2019 .  Chiefconcernaccording to patient : I haven't had a sleep study in 15 years but I am using CPAP. Current machine is 27 years old or more.   I have the pleasure of seeing Marcus King today,a right -handed Caucasian male with a history of OSA.  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 He has had uncontrolled  BP, had fainting spells.    Sleeprelevant medical history: Nocturia after prostrate cancer , 4 times each night. Syncope- passing out many, many times. Has fallen on his face, has broken his nose. Bradycardia. Sleep walking; none , Tonsillectomy at age 78.   Familymedical /sleep history:no  other family member on CPAP with a diagnosis of  OSA.father was alcoholic.   Social history:Patient is retired from Engineer, petroleum in health care only 2 years ago, in 2018  - self employed -and lives in a household with 2 persons/. Family status is married , with  2 adult children, a daughter is a Therapist, sports. Marland KitchenPets are not  present. Tobacco use- quit 40 years ago after  20 years. ETOH use never - Caffeine intake in form of Coffee( 2 cups ) Soda( rare ) Tea ( rare ), nor energy drinks. Regular exercise - none    Hobbies : yard work   Sleep habits are as follows:The patient's dinner time is between 5-6 PM.  There are snacks before bed time. The patient goes to bed at 11 PM and  Goes to sleep promptly- continues to sleep for 2-3 hours, wakes for many bathroom breaks.   The preferred sleep position is prone with extended arm. , with the support of 1 pillow. He is sleeping in a different room. Dreaming reportedly 50 % of night.   7.30 AM is the usual rise time. The patient wakes up spontaneously.[name]  He reports not feeling refreshed or restored in AM, he feels groggy in AM since he tried on many HTN medication. withot symptoms such as dry mouth, rare morning headaches-, and residual fatigue.  Naps are taken daily /frequently, lasting 60 minutes after yard work and feels refreshed.   REVIEW OF SYSTEMS: Out of a complete 14 system review of symptoms, the patient complains only of the following symptoms, and all other reviewed systems are negative.  See HPI  ALLERGIES: Allergies  Allergen Reactions  . Beef-Derived Products Diarrhea  . Clonazepam Other (See Comments)    High doses cause drunk feeling (>1 mg)  . Monosodium Glutamate Other (See Comments)    MSG (soups and oriental foods)  . Pegademase Bovine Diarrhea    HOME MEDICATIONS: Outpatient  Medications Prior to Visit  Medication Sig Dispense Refill  . amLODipine (NORVASC) 5 MG tablet Take 1 tablet (5 mg total) by mouth daily. 90 tablet 2  . busPIRone (BUSPAR) 10 MG tablet Take 10 mg by mouth as needed (for anxiety).     . carvedilol (COREG) 25 MG tablet take 1 tablet (25 mg) by oral route 2 times per day with food 180 tablet 0  . cloNIDine (CATAPRES) 0.2 MG tablet TAKE ONE TABLET BY MOUTH TWICE DAILY 180 tablet 0  . furosemide (LASIX) 20 MG tablet 20 mg daily.    . hydrOXYzine (ATARAX/VISTARIL) 10  MG tablet Take 10 mg by mouth 3 (three) times daily as needed.    . labetalol (NORMODYNE) 300 MG tablet Take 300 mg by mouth 2 (two) times daily.    [name] lisinopril (ZESTRIL) 20 MG tablet TAKE ONE TABLET BY MOUTH TWICE DAILY 180 tablet 0  . PARoxetine (PAXIL) 40 MG tablet Take 40 mg by mouth daily.     . pravastatin (PRAVACHOL) 10 MG tablet TAKE 1 TABLET BY MOUTH AT BEDTIME 90 tablet 1  . Probiotic Product (PROBIOTIC-10 PO) Take by mouth.    . SUPER B COMPLEX/C PO Take by mouth.    [name] UNABLE TO FIND Med Name: Pepsin 250 mg digestive support     No facility-administered medications prior to visit.    PAST MEDICAL HISTORY: Past Medical History:  Diagnosis Date  . Anxiety   . Chronic pain of left knee 08/11/2016  . Food poisoning   . Headache    sinus  . Hypertension   . Orthostatic hypotension   . OSA on CPAP   . Prostate cancer ([place])   . S/P TKR (total knee replacement) using cement, 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    PAST SURGICAL HISTORY: Past Surgical History:  Procedure Laterality Date  . CARDIAC CATHETERIZATION    . CATARACT EXTRACTION W/ INTRAOCULAR LENS  IMPLANT, BILATERAL Bilateral   . COLON SURGERY  2014  . COLOSTOMY REVERSAL  2014  . HERNIA REPAIR     x2  . JOINT REPLACEMENT    . PROSTATE SURGERY  2006  . SEPTOPLASTY    . TOTAL KNEE ARTHROPLASTY Left 09/28/2016   Procedure: TOTAL KNEE ARTHROPLASTY;  Surgeon: [name], MD;  Location: [facility];  Service: Orthopedics;  Laterality: Left;    FAMILY HISTORY: Family History  Problem Relation Age of Onset  . Stroke Mother   . Lung disease Father   . Stroke Father   . Leukemia Sister   . Alzheimer's disease Neg Hx     SOCIAL HISTORY: Social History   Socioeconomic History  . Marital status: Married    Spouse name: Not on file  . Number of children: 2  . Years of education: Not on file  . Highest education level: High school graduate  Occupational History  . Not on file  Tobacco Use  . Smoking status: Former Smoker     Years: 20.00    Types: Cigarettes    Quit date: 1979    Years since quitting: 42.1  . Smokeless tobacco: Never Used  . Tobacco comment: quit 37 years ago  Substance and Sexual Activity  . Alcohol use: No  . Drug use: No  . Sexual activity: Not on file  Other Topics Concern  . Not on file  Social History Narrative   LIves at home with his wife   Self employed   Right handed   Drinks about 2 cups of caffeine daily  Social Determinants of Health   Financial Resource Strain:   . Difficulty of Paying Living Expenses: Not on file  Food Insecurity:   . Worried About Charity fundraiser in the Last Year: Not on file  . Ran Out of Food in the Last Year: Not on file  Transportation Needs:   . Lack of Transportation (Medical): Not on file  . Lack of Transportation (Non-Medical): Not on file  Physical Activity:   . Days of Exercise per Week: Not on file  . Minutes of Exercise per Session: Not on file  Stress:   . Feeling of Stress : Not on file  Social Connections:   . Frequency of Communication with Friends and Family: Not on file  . Frequency of Social Gatherings with Friends and Family: Not on file  . Attends Religious Services: Not on file  . Active Member of Clubs or Organizations: Not on file  . Attends Archivist Meetings: Not on file  . Marital Status: Not on file  Intimate Partner Violence:   . Fear of Current or Ex-Partner: Not on file  . Emotionally Abused: Not on file  . Physically Abused: Not on file  . Sexually Abused: Not on file      PHYSICAL EXAM  Vitals:   01/25/20 1031  BP: 132/86  Pulse: 63  Temp: (!) 97.2 F (36.2 C)  Weight: 187 lb 12.8 oz (85.2 kg)  Height: 5\' 10"  (1.778 m)   Body mass index is 26.95 kg/m.  Generalized: Well developed, in no acute distress  Chest: Lungs clear to auscultation bilaterally  Neurological examination  Mentation: Alert oriented to time, place, history taking. Follows all commands speech and language  fluent Cranial nerve II-XII: Extraocular movements were full, visual field were full on confrontational test Head turning and shoulder shrug  were normal and symmetric. Motor: The motor testing reveals 5 over 5 strength of all 4 extremities. Good symmetric motor tone is noted throughout.  Sensory: Sensory testing is intact to soft touch on all 4 extremities. No evidence of extinction is noted.  Gait and station: Gait is normal.    DIAGNOSTIC DATA (LABS, IMAGING, TESTING) - I reviewed patient records, labs, notes, testing and imaging myself where available.  Lab Results  Component Value Date   WBC 7.6 01/09/2019   HGB 13.6 01/09/2019   HCT 41.6 01/09/2019   MCV 89.7 01/09/2019   PLT 255 01/09/2019      Component Value Date/Time   NA 137 01/09/2019 2020   NA 141 10/11/2018 1235   K 3.9 01/09/2019 2020   CL 100 01/09/2019 2020   CO2 28 01/09/2019 2020   GLUCOSE 100 (H) 01/09/2019 2020   BUN 17 01/09/2019 2020   BUN 22 10/11/2018 1235   CREATININE 1.21 01/09/2019 2020   CALCIUM 9.5 01/09/2019 2020   PROT 6.1 (L) 01/09/2019 2020   ALBUMIN 3.5 01/09/2019 2020   AST 22 01/09/2019 2020   ALT 14 01/09/2019 2020   ALKPHOS 66 01/09/2019 2020   BILITOT 0.7 01/09/2019 2020   GFRNONAA 57 (L) 01/09/2019 2020   GFRAA >60 01/09/2019 2020    Lab Results  Component Value Date   HGBA1C 5.6 12/01/2017   Lab Results  Component Value Date   VITAMINB12 257 12/01/2017   No results found for: TSH    ASSESSMENT AND PLAN 79 y.o. 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 here with:  1.  Obstructive sleep apnea on BiPAP  -Good compliance -Residual AHI is slightly elevated.  I will put him on a set pressure of 18/14 centimeters of water -He would like to switch to a new  DME company I will place this order and see if it is approved through insurance. Follow-up in 6 months or sooner if needed.   I spent 25 minutes with the patient this time was spent reviewing his sleep study and compliance download.   Ward Givens, MSN, NP-C 01/25/2020, 10:41 AM [facility] [address], [place], [place] [address] [phone]

## 2020-01-25 NOTE — Patient Instructions (Signed)
Your Plan:  Continue using Bipap Will switch to [facility] If your symptoms worsen or you develop new symptoms please let us know.   Thank you for coming to see [place] at [facility]. I hope we have been able to provide you high quality care today.  You may receive a patient satisfaction survey over the next few weeks. We would appreciate your feedback and comments so that we may continue to improve ourselves and the health of our patients.

## 2020-01-27 ENCOUNTER — Other Ambulatory Visit: Payer: Self-pay | Admitting: Family Medicine

## 2020-02-07 ENCOUNTER — Telehealth: Payer: Self-pay | Admitting: Adult Health

## 2020-02-07 DIAGNOSIS — G4733 Obstructive sleep apnea (adult) (pediatric): Secondary | ICD-10-CM

## 2020-02-07 NOTE — Telephone Encounter (Signed)
Patient called in regards to his cpap orders that were to be sent to [facility]. He states they informed him they have not received any orders.

## 2020-02-07 NOTE — Telephone Encounter (Signed)
Looked into the orders tab, I didn't see any orders other than the orders placed by Dr.Dohmeir on 11/08/19 at .12:44 PM.

## 2020-02-08 NOTE — Telephone Encounter (Signed)
Received CM that [facility] did receive message sent relating to new bipap orders on pt.

## 2020-02-08 NOTE — Telephone Encounter (Signed)
I placed order for  the bipap new pressure and switching of DME.  CMM to [facility].  I relayed this pt and he will follow up with [facility] next week.

## 2020-02-13 NOTE — Telephone Encounter (Signed)
Patient states he is needing a reset on his bipap and is no longer wanting to use [facility] as he hasnt been able to get anything done with them and would like help in being able to get in touch with them or would like to setup with [facility]

## 2020-02-13 NOTE — Telephone Encounter (Signed)
I called pt he states that he is still waiting on the orders changes from the new DME [facility]. I called spoke to [place] in [place], she spoke to Charmian Muff, they have all they need, but not ready to contact him yet, will be 7-10 days from order on 02-08-20.  I am willing to do what he wants if wants to stay or go back to [facility] pt.  He is to let me know.   Left [phone] with him.

## 2020-02-14 ENCOUNTER — Other Ambulatory Visit: Payer: Self-pay

## 2020-02-14 MED ORDER — PRAVASTATIN SODIUM 10 MG PO TABS: 10.0000 mg | ORAL_TABLET | Freq: Every day | ORAL | 0 refills | Status: DC

## 2020-02-15 ENCOUNTER — Other Ambulatory Visit: Payer: Self-pay

## 2020-02-15 MED ORDER — PRAVASTATIN SODIUM 10 MG PO TABS: 10.0000 mg | ORAL_TABLET | Freq: Every day | ORAL | 0 refills | Status: DC

## 2020-03-08 ENCOUNTER — Other Ambulatory Visit: Payer: Self-pay | Admitting: Cardiology

## 2020-03-12 DIAGNOSIS — Z23 Encounter for immunization: Secondary | ICD-10-CM | POA: Diagnosis not present

## 2020-03-26 ENCOUNTER — Telehealth: Payer: Self-pay | Admitting: Family Medicine

## 2020-03-26 NOTE — Progress Notes (Signed)
  Chronic Care Management   Outreach Note  03/26/2020 Name: Giro Potts MRN: [id number] DOB: 1940-12-20  Referred by: Rochel Brome, MD Reason for referral : No chief complaint on file.   An unsuccessful telephone outreach was attempted today. The patient was referred to the pharmacist for assistance with care management and care coordination.   Follow Up Plan:   Earney Hamburg Up[facility] Scheduler

## 2020-04-04 DIAGNOSIS — M1711 Unilateral primary osteoarthritis, right knee: Secondary | ICD-10-CM | POA: Diagnosis not present

## 2020-04-05 ENCOUNTER — Other Ambulatory Visit: Payer: Self-pay

## 2020-04-05 DIAGNOSIS — M1711 Unilateral primary osteoarthritis, right knee: Secondary | ICD-10-CM | POA: Diagnosis not present

## 2020-04-05 MED ORDER — CLONIDINE HCL 0.2 MG PO TABS: 0.2000 mg | ORAL_TABLET | Freq: Two times a day (BID) | ORAL | 0 refills | Status: DC

## 2020-04-08 ENCOUNTER — Telehealth: Payer: Self-pay | Admitting: Family Medicine

## 2020-04-08 NOTE — Progress Notes (Signed)
  Chronic Care Management   Outreach Note  04/08/2020 Name: Marcus King MRN: [id number] DOB: Nov 06, 1941  Referred by: Rochel Brome, MD Reason for referral : No chief complaint on file.   An unsuccessful telephone outreach was attempted today. The patient was referred to the pharmacist for assistance with care management and care coordination.   This note is not being shared with the patient for the following reason: To respect privacy (The patient or proxy has requested that the information not be shared).  Follow Up Plan:   Earney Hamburg [facility] Scheduler

## 2020-04-08 NOTE — Progress Notes (Signed)
  Chronic Care Management   Note  04/08/2020 Name: Marcus King MRN: [id number] DOB: Aug 09, 1941  Marcus King is a 79 y.o. year old male who is a primary care patient of Cox, Kirsten, MD. I reached out to [name] by phone today in response to a referral sent by Marcus King PCP, Cox, Kirsten, MD.   Marcus King was given information about Chronic Care Management services today including:  1. CCM service includes personalized support from designated clinical staff supervised by his physician, including individualized plan of care and coordination with other care providers 2. 24/7 contact phone numbers for assistance for urgent and routine care needs. 3. Service will only be billed when office clinical staff spend 20 minutes or more in a month to coordinate care. 4. Only one practitioner may furnish and bill the service in a calendar month. 5. The patient may stop CCM services at any time (effective at the end of the month) by phone call to the office staff.   Patient agreed to services and verbal consent obtained.   This note is not being shared with the patient for the following reason: To respect privacy (The patient or proxy has requested that the information not be shared).  Follow up plan:   Marcus King

## 2020-04-13 DIAGNOSIS — I2699 Other pulmonary embolism without acute cor pulmonale: Secondary | ICD-10-CM | POA: Insufficient documentation

## 2020-04-13 HISTORY — DX: Other pulmonary embolism without acute cor pulmonale: I26.99

## 2020-04-19 ENCOUNTER — Other Ambulatory Visit: Payer: Self-pay | Admitting: Family Medicine

## 2020-04-22 ENCOUNTER — Encounter: Payer: Self-pay | Admitting: Family Medicine

## 2020-04-22 NOTE — Progress Notes (Signed)
Subjective:  Patient ID: Marcus King, male    DOB: [birth year]  Age: 79 y.o. MRN: [id number]  Chief Complaint  Patient presents with  . Fatigue  . Shortness of Breath    Patient states he was sick 2 weeks ago. Since that time he has noticed he becomes SOB very easily.  . Sleep Apnea    Uses BIPAP    HPI Patietn presents for dyspnea on exertion. He was sick 2 weeks for 48 - 72 hours. Had malaise. No fever. No earache, st, nasal congestion. Dypsnea is not improving. Pt had J and J vaccine early in April 2021. Denies chest pain.  Patient gets severely dyspneic with mild exertion.  Patient has a history of hypertension and he opted to stop his clonidine about 2 weeks ago. He does not know why he discontinued it. His bp is very variable. At times very high, and then drops to low normal.   Past Medical History:  Diagnosis Date  . Anxiety   . Cervical disc disorder with myelopathy, cervicothoracic region   . Chronic pain of left knee 08/11/2016  . Food poisoning   . GAD (generalized anxiety disorder)   . Headache    sinus  . Hypertension   . Mixed hyperlipidemia   . Orthostatic hypotension   . OSA on CPAP   . Prostate cancer ([place])   . 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 CARDIAC CATHETERIZATION    . CATARACT EXTRACTION W/ INTRAOCULAR LENS  IMPLANT, BILATERAL Bilateral   . COLON SURGERY  2014  . COLOSTOMY REVERSAL  2014  . HERNIA REPAIR     x2  . JOINT REPLACEMENT    . PROSTATE SURGERY  2006  . SEPTOPLASTY    . TOTAL KNEE ARTHROPLASTY Left 09/28/2016   Procedure: TOTAL KNEE ARTHROPLASTY;  Surgeon: [name], MD;  Location: [facility];  Service: Orthopedics;  Laterality: Left;    Family History  Problem Relation Age of Onset  . Stroke Mother   . Parkinson's disease Mother   . Lung disease Father   . Stroke Father   . Alcoholism Father   . Leukemia Sister   . Muscular dystrophy Son         Immunologist  . Alzheimer's disease Neg Hx    Social History   Socioeconomic History  . Marital status: Married    Spouse name: Not on file  . Number of children: 2  . Years of education: Not on file  . Highest education level: High school graduate  Occupational History  . Occupation: Engineer, petroleum    Comment: ICDs/Wound Care  Tobacco Use  . Smoking status: Former Smoker    Years: 20.00    Types: Cigarettes    Quit date: 1979    Years since quitting: 42.3  . Smokeless tobacco: Never Used  . Tobacco comment: quit 37 years ago  Substance and Sexual Activity  . Alcohol use: No  . Drug use: No  . Sexual activity: Not on file  Other Topics Concern  . Not on file  Social History Narrative   LIves at home with his wife   Self employed   Right handed   Drinks about 2 cups of caffeine daily   Social Determinants of Health   Financial Resource Strain:   . Difficulty of Paying Living Expenses:   Food Insecurity:   . Worried About Charity fundraiser in the  Last Year:   . [place] in the Last Year:   Transportation Needs:   . Film/video editor (Medical):   [name] Lack of Transportation (Non-Medical):   Physical Activity:   . Days of Exercise per Week:   . Minutes of Exercise per Session:   Stress:   . Feeling of Stress :   Social Connections:   . Frequency of Communication with Friends and Family:   . Frequency of Social Gatherings with Friends and Family:   . Attends Religious Services:   . Active Member of Clubs or Organizations:   . Attends Archivist Meetings:   [name] Marital Status:     Review of Systems  Constitutional: Negative for chills, diaphoresis, fatigue and fever.  HENT: Negative for congestion, ear pain and sore throat.   Respiratory: Positive for shortness of breath (with mild exertion x 2 weeks). Negative for cough.   Cardiovascular: Negative for chest pain and leg swelling.  Neurological: Negative for dizziness and headaches.      Objective:  BP 132/78   Pulse 75   Temp (!) 97.3 F (36.3 C)   Ht 5\' 10"  (1.778 m)   Wt 183 lb (83 kg)   SpO2 98%   BMI 26.26 kg/m   BP/Weight 04/23/2020 01/25/2020 [id number]  Systolic BP Q000111Q Q000111Q [id number]  Diastolic BP 78 86 65  Wt. (Lbs) 183 187.8 193  BMI 26.26 26.95 27.69   Pulse oximetry dropped to 82% with ambulation. Put on 2 Liters oxygen and it improved to 99%. Pulse  62, BP 120/70.  Physical Exam Vitals reviewed.  Constitutional:      Appearance: Normal appearance. He is normal weight.  Cardiovascular:     Rate and Rhythm: Normal rate and regular rhythm.  Pulmonary:     Effort: Pulmonary effort is normal.     Breath sounds: Normal breath sounds.  Abdominal:     General: Abdomen is flat. Bowel sounds are normal.     Palpations: Abdomen is soft.  Neurological:     Mental Status: He is alert and oriented to person, place, and time.  Psychiatric:        Mood and Affect: Mood normal.        Behavior: Behavior normal.     Diabetic Foot Exam - Simple   No data filed       Lab Results  Component Value Date   WBC 7.6 01/09/2019   HGB 13.6 01/09/2019   HCT 41.6 01/09/2019   PLT 255 01/09/2019   GLUCOSE 100 (H) 01/09/2019   ALT 14 01/09/2019   AST 22 01/09/2019   NA 137 01/09/2019   K 3.9 01/09/2019   CL 100 01/09/2019   CREATININE 1.21 01/09/2019   BUN 17 01/09/2019   CO2 28 01/09/2019   INR 1.03 01/09/2019   HGBA1C 5.6 12/01/2017      Assessment & Plan:   1. Hypoxia Dropped with ambulation significantly. Recommend rule out PE. Go by EMS.   2. Fatigue, unspecified type Needs labs.  3. Dyspnea on exertion Needs further work up.  EKG: NSR. No st changes.   No orders of the defined types were placed in this encounter.    Follow-up: No follow-ups on file.  An After Visit Summary was printed and given to the patient.  Rochel Brome [facility] [phone]

## 2020-04-23 ENCOUNTER — Encounter: Payer: Self-pay | Admitting: Family Medicine

## 2020-04-23 ENCOUNTER — Other Ambulatory Visit: Payer: Self-pay

## 2020-04-23 ENCOUNTER — Ambulatory Visit (INDEPENDENT_AMBULATORY_CARE_PROVIDER_SITE_OTHER): Payer: Medicare Other | Admitting: Family Medicine

## 2020-04-23 VITALS — BP 132/78 | HR 75 | Temp 97.3°F | Ht 70.0 in | Wt 183.0 lb

## 2020-04-23 DIAGNOSIS — I10 Essential (primary) hypertension: Secondary | ICD-10-CM | POA: Diagnosis not present

## 2020-04-23 DIAGNOSIS — R5383 Other fatigue: Secondary | ICD-10-CM

## 2020-04-23 DIAGNOSIS — I2699 Other pulmonary embolism without acute cor pulmonale: Secondary | ICD-10-CM | POA: Diagnosis not present

## 2020-04-23 DIAGNOSIS — R0600 Dyspnea, unspecified: Secondary | ICD-10-CM | POA: Diagnosis not present

## 2020-04-23 DIAGNOSIS — I313 Pericardial effusion (noninflammatory): Secondary | ICD-10-CM | POA: Diagnosis not present

## 2020-04-23 DIAGNOSIS — I2602 Saddle embolus of pulmonary artery with acute cor pulmonale: Secondary | ICD-10-CM | POA: Diagnosis not present

## 2020-04-23 DIAGNOSIS — R0902 Hypoxemia: Secondary | ICD-10-CM

## 2020-04-23 DIAGNOSIS — I340 Nonrheumatic mitral (valve) insufficiency: Secondary | ICD-10-CM | POA: Diagnosis not present

## 2020-04-23 DIAGNOSIS — R0602 Shortness of breath: Secondary | ICD-10-CM | POA: Diagnosis not present

## 2020-04-23 DIAGNOSIS — I82402 Acute embolism and thrombosis of unspecified deep veins of left lower extremity: Secondary | ICD-10-CM | POA: Diagnosis not present

## 2020-04-23 DIAGNOSIS — R0609 Other forms of dyspnea: Secondary | ICD-10-CM

## 2020-04-23 HISTORY — DX: Other fatigue: R53.83

## 2020-04-23 HISTORY — DX: Dyspnea, unspecified: R06.00

## 2020-04-23 HISTORY — DX: Other forms of dyspnea: R06.09

## 2020-04-24 ENCOUNTER — Telehealth: Payer: Self-pay | Admitting: Cardiology

## 2020-04-24 DIAGNOSIS — G4733 Obstructive sleep apnea (adult) (pediatric): Secondary | ICD-10-CM | POA: Diagnosis not present

## 2020-04-24 DIAGNOSIS — Z7902 Long term (current) use of antithrombotics/antiplatelets: Secondary | ICD-10-CM | POA: Diagnosis not present

## 2020-04-24 DIAGNOSIS — I2602 Saddle embolus of pulmonary artery with acute cor pulmonale: Secondary | ICD-10-CM | POA: Diagnosis not present

## 2020-04-24 DIAGNOSIS — I77810 Thoracic aortic ectasia: Secondary | ICD-10-CM | POA: Diagnosis present

## 2020-04-24 DIAGNOSIS — I2699 Other pulmonary embolism without acute cor pulmonale: Secondary | ICD-10-CM | POA: Diagnosis not present

## 2020-04-24 DIAGNOSIS — Z79899 Other long term (current) drug therapy: Secondary | ICD-10-CM | POA: Diagnosis not present

## 2020-04-24 DIAGNOSIS — E785 Hyperlipidemia, unspecified: Secondary | ICD-10-CM | POA: Diagnosis present

## 2020-04-24 DIAGNOSIS — R937 Abnormal findings on diagnostic imaging of other parts of musculoskeletal system: Secondary | ICD-10-CM | POA: Diagnosis present

## 2020-04-24 DIAGNOSIS — I2510 Atherosclerotic heart disease of native coronary artery without angina pectoris: Secondary | ICD-10-CM | POA: Diagnosis present

## 2020-04-24 DIAGNOSIS — R0602 Shortness of breath: Secondary | ICD-10-CM | POA: Diagnosis not present

## 2020-04-24 DIAGNOSIS — Z9861 Coronary angioplasty status: Secondary | ICD-10-CM | POA: Diagnosis not present

## 2020-04-24 DIAGNOSIS — Z91018 Allergy to other foods: Secondary | ICD-10-CM | POA: Diagnosis not present

## 2020-04-24 DIAGNOSIS — R911 Solitary pulmonary nodule: Secondary | ICD-10-CM | POA: Diagnosis present

## 2020-04-24 DIAGNOSIS — I10 Essential (primary) hypertension: Secondary | ICD-10-CM | POA: Diagnosis present

## 2020-04-24 DIAGNOSIS — M1990 Unspecified osteoarthritis, unspecified site: Secondary | ICD-10-CM | POA: Diagnosis present

## 2020-04-24 DIAGNOSIS — Z9102 Food additives allergy status: Secondary | ICD-10-CM | POA: Diagnosis not present

## 2020-04-24 DIAGNOSIS — I82402 Acute embolism and thrombosis of unspecified deep veins of left lower extremity: Secondary | ICD-10-CM | POA: Diagnosis not present

## 2020-04-24 DIAGNOSIS — Z8545 Personal history of malignant neoplasm of unspecified male genital organ: Secondary | ICD-10-CM | POA: Diagnosis not present

## 2020-04-24 DIAGNOSIS — F418 Other specified anxiety disorders: Secondary | ICD-10-CM | POA: Diagnosis not present

## 2020-04-24 NOTE — Telephone Encounter (Signed)
New Message:     `Pt would like to switch from Dr Radford Pax to Dr Harriet Masson please. The reason for the change is because, it is closer to the patient's home.

## 2020-04-24 NOTE — Telephone Encounter (Signed)
I am fine with that 

## 2020-04-29 ENCOUNTER — Telehealth: Payer: Self-pay

## 2020-04-29 DIAGNOSIS — Z8546 Personal history of malignant neoplasm of prostate: Secondary | ICD-10-CM | POA: Diagnosis not present

## 2020-04-29 DIAGNOSIS — I080 Rheumatic disorders of both mitral and aortic valves: Secondary | ICD-10-CM | POA: Diagnosis not present

## 2020-04-29 DIAGNOSIS — Z87891 Personal history of nicotine dependence: Secondary | ICD-10-CM | POA: Diagnosis not present

## 2020-04-29 DIAGNOSIS — R918 Other nonspecific abnormal finding of lung field: Secondary | ICD-10-CM | POA: Diagnosis not present

## 2020-04-29 DIAGNOSIS — Z9981 Dependence on supplemental oxygen: Secondary | ICD-10-CM | POA: Diagnosis not present

## 2020-04-29 DIAGNOSIS — Z7901 Long term (current) use of anticoagulants: Secondary | ICD-10-CM | POA: Diagnosis not present

## 2020-04-29 DIAGNOSIS — J439 Emphysema, unspecified: Secondary | ICD-10-CM | POA: Diagnosis not present

## 2020-04-29 DIAGNOSIS — F419 Anxiety disorder, unspecified: Secondary | ICD-10-CM | POA: Diagnosis not present

## 2020-04-29 DIAGNOSIS — I10 Essential (primary) hypertension: Secondary | ICD-10-CM | POA: Diagnosis not present

## 2020-04-29 DIAGNOSIS — G4733 Obstructive sleep apnea (adult) (pediatric): Secondary | ICD-10-CM | POA: Diagnosis not present

## 2020-04-29 DIAGNOSIS — I82402 Acute embolism and thrombosis of unspecified deep veins of left lower extremity: Secondary | ICD-10-CM | POA: Diagnosis not present

## 2020-04-29 DIAGNOSIS — F329 Major depressive disorder, single episode, unspecified: Secondary | ICD-10-CM | POA: Diagnosis not present

## 2020-04-29 DIAGNOSIS — I712 Thoracic aortic aneurysm, without rupture: Secondary | ICD-10-CM | POA: Diagnosis not present

## 2020-04-29 DIAGNOSIS — I2699 Other pulmonary embolism without acute cor pulmonale: Secondary | ICD-10-CM | POA: Diagnosis not present

## 2020-04-29 DIAGNOSIS — E785 Hyperlipidemia, unspecified: Secondary | ICD-10-CM | POA: Diagnosis not present

## 2020-04-29 NOTE — Telephone Encounter (Signed)
  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  He was contacted today via telephone in regards to transition of care, I spoke with his wife Marcus King.  Marcus King presented to the ED with Marcus King that had been going on about 10 days.  He had a syncopal episode 3 weeks ago and received the J&J COVID Vaccine on March 30.  He was placed on IV Heparin then transitioned to rivaroxaban.  Due to insurance he was discharged on Eliquis 10 mg BID x 7 days then he is to start Eliquis 5 mg BID.  Incidental finding of a 8 x 7 mm focus on lateral left upper lobe, questionable scar vs. Nodule.  Abnormality also noted in T9 vertebral body--will follow-up with Marcus King for both findings  Ascending thoracic aorta dilation-measuring 4.2 cm in diameter--Recommended annual imaging follow-up by CT--followed by PCP  Discharge Instructions: Follow-up with Marcus King and PCP.  Home Oxygen CONTINUOUS   Items Reviewed:  Medications reviewed: yes  Allergies reviewed: yes  Dietary changes reviewed: yes  Referrals reviewed: yes   Any transportation issues/concerns?: yes  Patient's son is home helping, Marcus King states that if he is not available to help get him to the follow-up appointment she will need to reschedule    Any patient concerns? no   Confirmed importance and date/time of follow-up visits scheduled yes  Provider Appointment booked with Marcus King  Confirmed with patient if condition begins to worsen call PCP or go to the ER.  Patient was given the office number and  encouraged to call back with question or concerns.  : yes   Shelle Iron, LPN [id number] [phone] AM

## 2020-04-29 NOTE — Telephone Encounter (Signed)
Follow up   Per the previous message the patient daughter would like for the patient to now see Dr. Agustin Cree instead of Dr. Radford Pax because it is closer to home and the patient can't travel to [facility].

## 2020-04-30 NOTE — Telephone Encounter (Signed)
That will be fine with me. 

## 2020-04-30 NOTE — Progress Notes (Signed)
Subjective:  Patient ID: Marcus King, male    DOB: Sep 19, 1941  Age: 79 y.o. MRN: [id number]  Chief Complaint  Patient presents with  . Hospitalization Follow-up  . 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 Marcus King presented to the ED with [facility] that had been going on about  10 days. He had a syncopal episode 3 weeks ago and received the J&J Covid Vaccine on March 30th. He was placed on IV Heparin then transitioned to rivaroxaban. Due to insurance he was discharged on Eliquis 10 mg BID x 7 days then Eliquis 5 mg BID. He has a distant history of prostate cancer (2006) and had been lost to follow up with Marcus. Jeffie King. His PSA level was found to be elevated.  I spoke with Marcus. Jeffie King today and he will set up an appt for follow up. Discharge instructions: follow up with Marcus King and PCP. Home Oxygen continuous at 2 L.  Past Medical History:  Diagnosis Date  . Anxiety   . Cervical disc disorder with myelopathy, cervicothoracic region   . Chronic pain of left knee 08/11/2016  . Food poisoning   . GAD (generalized anxiety disorder)   . Headache    sinus  . Hypertension   . Mixed hyperlipidemia   . Orthostatic hypotension   . OSA on CPAP   . Prostate cancer ([place])   . 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 CARDIAC CATHETERIZATION    . CATARACT EXTRACTION W/ INTRAOCULAR LENS  IMPLANT, BILATERAL Bilateral   . COLON SURGERY  2014  . COLOSTOMY REVERSAL  2014  . HERNIA REPAIR     x2  . JOINT REPLACEMENT    . PROSTATE SURGERY  2006  . SEPTOPLASTY    . TOTAL KNEE ARTHROPLASTY Left 09/28/2016   Procedure: TOTAL KNEE ARTHROPLASTY;  Surgeon: Marcus Huger, MD;  Location: [place];  Service: Orthopedics;  Laterality: Left;    Family History  Problem Relation Age of Onset  . Stroke Mother   . Parkinson's disease Mother   . Lung disease Father   . Stroke Father   . Alcoholism Father   . Leukemia Sister   . Muscular dystrophy Son        Immunologist  . Alzheimer's disease Neg Hx    Social History   Socioeconomic History  . Marital status: Married    Spouse name: Not on file  . Number of children: 2  . Years of education: Not on file  . Highest education level: High school graduate  Occupational History  . Occupation: Engineer, petroleum    Comment: ICDs/Wound Care  Tobacco Use  . Smoking status: Former Smoker    Years: 20.00    Types: Cigarettes    Quit date: 1979    Years since quitting: 42.4  . Smokeless tobacco: Never Used  . Tobacco comment: quit 37 years ago  Substance and Sexual Activity  . Alcohol use: No  . Drug use: No  . Sexual activity: Not on file  Other Topics Concern  . Not on file  Social History Narrative   LIves at home with his wife   Self employed  Right handed   Drinks about 2 cups of caffeine daily   Social Determinants of Health   Financial Resource Strain:   . Difficulty of Paying Living Expenses:   Food Insecurity: No Food Insecurity  . Worried About Charity fundraiser in the Last Year: Never true  . Ran Out of Food in the Last Year: Never true  Transportation Needs:   . Lack of Transportation (Medical):   [name] Lack of Transportation (Non-Medical):   Physical Activity:   . Days of Exercise per Week:   . Minutes of Exercise per Session:   Stress:   . Feeling of Stress :   Social Connections:   . Frequency of Communication with Friends and Family:   . Frequency of Social Gatherings with Friends and Family:   . Attends Religious Services:   . Active Member of Clubs or Organizations:   . Attends Archivist Meetings:   [name] Marital Status:     Review of Systems  Constitutional:  Negative for chills, diaphoresis, fatigue and fever.  HENT: Negative for congestion, ear pain and sore throat.   Respiratory: Negative for cough and shortness of breath.   Cardiovascular: Negative for chest pain and leg swelling.  Gastrointestinal: Negative for abdominal pain, constipation, diarrhea, nausea and vomiting.  Genitourinary: Negative for dysuria and urgency.  Musculoskeletal: Negative for arthralgias and myalgias.  Neurological: Negative for dizziness and headaches.  Psychiatric/Behavioral: Negative for dysphoric mood.     Objective:  BP 120/68   Pulse 69   Temp 97.7 F (36.5 C)   Ht 5\' 10"  (1.778 m)   Wt 181 lb (82.1 kg)   SpO2 98%   BMI 25.97 kg/m   BP/Weight 05/02/2020 04/23/2020 [id number]  Systolic BP 123456 [id number] [id number]  Diastolic BP 68 78 86  Wt. (Lbs) 181 183 187.8  BMI 25.97 26.26 26.95    Physical Exam Vitals reviewed.  Constitutional:      Appearance: Normal appearance. He is normal weight.  Cardiovascular:     Rate and Rhythm: Normal rate and regular rhythm.  Pulmonary:     Effort: Pulmonary effort is normal.     Breath sounds: Normal breath sounds.  Abdominal:     General: Abdomen is flat. Bowel sounds are normal.     Palpations: Abdomen is soft.  Neurological:     Mental Status: He is alert and oriented to person, place, and time.  Psychiatric:        Mood and Affect: Mood normal.        Behavior: Behavior normal.     Diabetic Foot Exam - Simple   No data filed       Lab Results  Component Value Date   WBC 7.6 01/09/2019   HGB 13.6 01/09/2019   HCT 41.6 01/09/2019   PLT 255 01/09/2019   GLUCOSE 100 (H) 01/09/2019   ALT 14 01/09/2019   AST 22 01/09/2019   NA 137 01/09/2019   K 3.9 01/09/2019   CL 100 01/09/2019   CREATININE 1.21 01/09/2019   BUN 17 01/09/2019   CO2 28 01/09/2019   INR 1.03 01/09/2019   HGBA1C 5.6 12/01/2017      Assessment & Plan:   1. Solid nodule of lung greater than 8 mm in diameter - Ordering  P.E.T.  scan  2. Pulmonary embolism and infarction (HCC) -Complete eliquis 10 mg one twice a day for one week after discharge and then start on eliquis 5 mg one twice a day.  3.  Prostate cancer ([place]) - PSA elevated. Marcus. Jeffie King to call patient with an appointment. - [place] records and Pamala Hurry will be calling with an appointment for him for next week.   4. Acute respiratory failure due to Pulmonary emboli  - on 2 L O2.    Orders Placed This Encounter  Procedures  . NM PET (CERIANNA) WHOLE BODY     Follow-up: No follow-ups on file.  An After Visit Summary was printed and given to the patient.  Rochel Brome [facility] [phone]

## 2020-05-02 ENCOUNTER — Ambulatory Visit (INDEPENDENT_AMBULATORY_CARE_PROVIDER_SITE_OTHER): Payer: Medicare Other | Admitting: Family Medicine

## 2020-05-02 ENCOUNTER — Other Ambulatory Visit: Payer: Self-pay

## 2020-05-02 ENCOUNTER — Encounter: Payer: Self-pay | Admitting: Family Medicine

## 2020-05-02 VITALS — BP 120/68 | HR 69 | Temp 97.7°F | Ht 70.0 in | Wt 181.0 lb

## 2020-05-02 DIAGNOSIS — I2699 Other pulmonary embolism without acute cor pulmonale: Secondary | ICD-10-CM | POA: Diagnosis not present

## 2020-05-02 DIAGNOSIS — J9601 Acute respiratory failure with hypoxia: Secondary | ICD-10-CM

## 2020-05-02 DIAGNOSIS — C61 Malignant neoplasm of prostate: Secondary | ICD-10-CM

## 2020-05-02 DIAGNOSIS — R911 Solitary pulmonary nodule: Secondary | ICD-10-CM

## 2020-05-02 NOTE — Patient Instructions (Addendum)
Lung Nodule - ordering  P.E.T. scan PSA elevated. Dr. Jeffie Pollock to call with an appointment. [place] records and Pamala Hurry will be calling with an appointment for him for next week.  Blood clots  Complete eliquis 10 mg one twice a day for one week after discharge and then start on eliquis 5 mg one twice a day.

## 2020-05-03 ENCOUNTER — Other Ambulatory Visit: Payer: Self-pay | Admitting: Family Medicine

## 2020-05-07 DIAGNOSIS — L300 Nummular dermatitis: Secondary | ICD-10-CM | POA: Diagnosis not present

## 2020-05-07 DIAGNOSIS — L570 Actinic keratosis: Secondary | ICD-10-CM | POA: Diagnosis not present

## 2020-05-07 DIAGNOSIS — L578 Other skin changes due to chronic exposure to nonionizing radiation: Secondary | ICD-10-CM | POA: Diagnosis not present

## 2020-05-07 DIAGNOSIS — L821 Other seborrheic keratosis: Secondary | ICD-10-CM | POA: Diagnosis not present

## 2020-05-07 NOTE — Chronic Care Management (AMB) (Cosign Needed)
Chronic Care Management Pharmacy  Name: Marcus King  MRN: [id number] DOB: Jun 20, 1941  Chief Complaint/ HPI  Marcus King,  79 y.o. , male presents for their Initial CCM visit with the clinical pharmacist via telephone due to COVID-19 Pandemic.  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   Office Visits: 05/02/2020 - Post-hospital visit for [name] after PE. Changed to Eliquis 5 mg bid once exhausted current supply of 10 mg bid.  04/23/2020 - Referred to hospital for SOB/PE.  11/24/2019 - no anemia and good cholesterol panel.  Consult Visit: 04/04/2020 - Ortho visit - injection for knee pain.  01/25/2020 - Neuro visit - adjusted CPAPC 01/08/2020 - Dermatology visit with Dr. Michele Mcalpine.   Medications: Outpatient Encounter Medications as of 05/08/2020  Medication Sig  . amLODipine (NORVASC) 5 MG tablet Take 1 tablet (5 mg total) by mouth daily.  [name] apixaban (ELIQUIS) 5 MG TABS tablet Take 5 mg by mouth 2 (two) times daily.  . busPIRone (BUSPAR) 10 MG tablet TAKE ONE TABLET BY MOUTH AS NEEDED  . carvedilol (COREG) 25 MG tablet take 1 tablet (25 mg) by oral route 2 times per day with food  . furosemide (LASIX) 20 MG tablet 20 mg daily.  . hydrOXYzine (ATARAX/VISTARIL) 10 MG tablet Take 10 mg by mouth 3 (three) times daily as needed.  [name] PARoxetine (PAXIL) 40 MG tablet Take 40 mg by mouth daily.   . pravastatin (PRAVACHOL) 10 MG tablet Take 1 tablet (10 mg total) by mouth at bedtime.  [name] lisinopril (ZESTRIL) 20 MG tablet TAKE ONE TABLET BY MOUTH TWICE DAILY (Patient not taking: Reported on 05/08/2020)   No facility-administered encounter medications on file as of 05/08/2020.     Current Diagnosis/Assessment:  Goals Addressed            This Visit's Progress   . Pharmacy Care Plan       CARE PLAN  ENTRY  Current Barriers:  . Chronic Disease Management support, education, and care coordination needs related to Hypertension and Hyperlipidemia   Hypertension . Pharmacist Clinical Goal(s): o Over the next 90 days, patient will work with PharmD and providers to maintain BP goal <140/90 . Current regimen:  o Carvedilol 25 mg bid, Furosemide 20 mg daily, Amlodipine 5 mg daily . Interventions: o Recommend patient keep up the good work controlling salt intake.  o Recommend patient keep up the good work walking for exercise.  . Patient self care activities - Over the next 90 days, patient will: o Check BP twice daily, document, and provide at future appointments o Ensure daily salt intake < 2300 mg/day  Hyperlipidemia . Pharmacist Clinical Goal(s): o Over the next 90 days, patient will work with PharmD and providers to maintain LDL goal < 100 . Current regimen:  o Pravastatin 10 mg daily . Interventions: o Recommend patient continue to walk for exercise.  o Recommend patient keep up the good work eating a heart healhty diet.  . Patient self care activities - Over the next 90 days, patient will: o Continue to do a great job taking medication as prescribed.  Medication management . Pharmacist Clinical Goal(s): o Over the next 90 days, patient will work with PharmD and providers to maintain optimal medication adherence . Current pharmacy: [facility] . Interventions o Comprehensive medication review performed. o Continue current medication management strategy .  Patient self care activities - Over the next 90 days, patient will: o Focus on medication adherence by continuing to use pill box.  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 Patient is trying to walk for exercise. Patient's wife helps him watch sodium in his diet. His wife watches sodium very closely for him.  Checking blood pressure twice daily. Reports blood pressure range is 110 - 160/68.  Plan  Continue current medications   S/P bilateral PE and Left lower extremity DVT   Patient has failed these meds in past: Xarelto, Heparin Patient is currently controlled on the following medications: Eliquis 5 mg bid  We discussed:  Patient is tolerating Eliquis well. He has no issues with bleeding or bruising.   Plan  Continue current medications   Hyperlipidemia   Lipid Panel  No results found for: CHOL, TRIG, HDL, LDLCALC, LDLDIRECT   The ASCVD Risk score Mikey Bussing DC Jr., et al., 2013) failed to calculate for the following reasons:   Cannot find a previous HDL lab   Cannot find a previous total cholesterol lab   Patient has failed these meds in past: n/a Patient is currently controlled on the following medications:  . Pravastatin 10 mg daily  We discussed:  diet and exercise extensively. Patient is walking with PT. He is also mindful of a heart healthy diet.   Plan  Continue current medications   Other Diagnosis:Anxiety    Patient has failed these meds in past: n/a Patient is currently controlled on the following medications: buspirone 10 mg prn, hydroxyzine 10 mg tid prn, paroxetine 40 mg daily  We discussed: Patient reports that paroxetine 40 mg daily is controlling symptoms. He uses prn medications 1-2x/week.   Plan  Continue current medications  Vaccines   Reviewed and discussed patient's vaccination history.  Patient has had Prevnar 11/16, Pneumovax 23 12/26/12, TD 12/11/09. COVID - J&J vaccine administered  03/12/2020.   Immunization History  Administered Date(s) Administered  . Influenza-Unspecified 09/07/2016, 09/13/2018  . Janssen (J&J) SARS-COV-2 Vaccination 03/12/2020  . Zoster Recombinat (Shingrix) 09/23/2018, 11/29/2018   Plan  Recommended patient receive annual flu vaccine in office.   Medication Management   Pt uses [facility] pharmacy for all medications Uses pill box? Yes Pt endorses good compliance  We discussed: Patient reports that he does not miss doses of medication.   Plan  Continue current medication management strategy    Follow up: 6 month phone visit

## 2020-05-08 ENCOUNTER — Ambulatory Visit: Payer: Medicare Other

## 2020-05-08 DIAGNOSIS — E785 Hyperlipidemia, unspecified: Secondary | ICD-10-CM

## 2020-05-08 DIAGNOSIS — I110 Hypertensive heart disease with heart failure: Secondary | ICD-10-CM

## 2020-05-08 NOTE — Patient Instructions (Incomplete)
Visit Information  Thank you for your time discussing your medications. I look forward to working with you to achieve your health care goals. Below is a summary of what we talked about during our visit.   Goals Addressed   None     [name] was given information about Chronic Care Management services today including:  1. CCM service includes personalized support from designated clinical staff supervised by his physician, including individualized plan of care and coordination with other care providers 2. 24/7 contact phone numbers for assistance for urgent and routine care needs. 3. Standard insurance, coinsurance, copays and deductibles apply for chronic care management only during months in which we provide at least 20 minutes of these services. Most insurances cover these services at 100%, however patients may be responsible for any copay, coinsurance and/or deductible if applicable. This service may help you avoid the need for more expensive face-to-face services. 4. Only one practitioner may furnish and bill the service in a calendar month. 5. The patient may stop CCM services at any time (effective at the end of the month) by phone call to the office staff.  Patient agreed to services and verbal consent obtained.   The patient verbalized understanding of instructions provided today and agreed to receive a mailed copy of patient instruction and/or educational materials. Telephone follow up appointment with pharmacy team member scheduled for: May 2022 unless needs assistance sooner.   Sherre Poot, PharmD Clinical Pharmacist [facility] [phone] (office) [phone] (mobile)  DASH Eating Plan DASH stands for "Dietary Approaches to Stop Hypertension." The DASH eating plan is a healthy eating plan that has been shown to reduce high blood pressure (hypertension). It may also reduce your risk for type 2 diabetes, heart disease, and stroke. The DASH eating plan may also  help with weight loss. What are tips for following this plan?  General guidelines  Avoid eating more than 2,300 mg (milligrams) of salt (sodium) a day. If you have hypertension, you may need to reduce your sodium intake to 1,500 mg a day.  Limit alcohol intake to no more than 1 drink a day for nonpregnant women and 2 drinks a day for men. One drink equals 12 oz of beer, 5 oz of wine, or 1 oz of hard liquor.  Work with your health care provider to maintain a healthy body weight or to lose weight. Ask what an ideal weight is for you.  Get at least 30 minutes of exercise that causes your heart to beat faster (aerobic exercise) most days of the week. Activities may include walking, swimming, or biking.  Work with your health care provider or diet and nutrition specialist (dietitian) to adjust your eating plan to your individual calorie needs. Reading food labels   Check food labels for the amount of sodium per serving. Choose foods with less than 5 percent of the Daily Value of sodium. Generally, foods with less than 300 mg of sodium per serving fit into this eating plan.  To find whole grains, look for the word "whole" as the first word in the ingredient list. Shopping  Buy products labeled as "low-sodium" or "no salt added."  Buy fresh foods. Avoid canned foods and premade or frozen meals. Cooking  Avoid adding salt when cooking. Use salt-free seasonings or herbs instead of table salt or sea salt. Check with your health care provider or pharmacist before using salt substitutes.  Do not fry foods. Cook foods using healthy methods such as baking, boiling,  grilling, and broiling instead.  Cook with heart-healthy oils, such as olive, canola, soybean, or sunflower oil. Meal planning  Eat a balanced diet that includes: ? 5 or more servings of fruits and vegetables each day. At each meal, try to fill half of your plate with fruits and vegetables. ? Up to 6-8 servings of whole grains each  day. ? Less than 6 oz of lean meat, poultry, or fish each day. A 3-oz serving of meat is about the same size as a deck of cards. One egg equals 1 oz. ? 2 servings of low-fat dairy each day. ? A serving of nuts, seeds, or beans 5 times each week. ? Heart-healthy fats. Healthy fats called Omega-3 fatty acids are found in foods such as flaxseeds and coldwater fish, like sardines, salmon, and mackerel.  Limit how much you eat of the following: ? Canned or prepackaged foods. ? Food that is high in trans fat, such as fried foods. ? Food that is high in saturated fat, such as fatty meat. ? Sweets, desserts, sugary drinks, and other foods with added sugar. ? Full-fat dairy products.  Do not salt foods before eating.  Try to eat at least 2 vegetarian meals each week.  Eat more home-cooked food and less restaurant, buffet, and fast food.  When eating at a restaurant, ask that your food be prepared with less salt or no salt, if possible. What foods are recommended? The items listed may not be a complete list. Talk with your dietitian about what dietary choices are best for you. Grains Whole-grain or whole-wheat bread. Whole-grain or whole-wheat pasta. Marcus King rice. Marcus King. Bulgur. Whole-grain and low-sodium cereals. Pita bread. Low-fat, low-sodium crackers. Whole-wheat flour tortillas. Vegetables Fresh or frozen vegetables (raw, steamed, roasted, or grilled). Low-sodium or reduced-sodium tomato and vegetable juice. Low-sodium or reduced-sodium tomato sauce and tomato paste. Low-sodium or reduced-sodium canned vegetables. Fruits All fresh, dried, or frozen fruit. Canned fruit in natural juice (without added sugar). Meat and other protein foods Skinless chicken or [place]. Ground chicken or [place]. Pork with fat trimmed off. Fish and seafood. Egg whites. Dried beans, peas, or lentils. Unsalted nuts, nut butters, and seeds. Unsalted canned beans. Lean cuts of beef with fat trimmed off.  Low-sodium, lean deli meat. Dairy Low-fat (1%) or fat-free (skim) milk. Fat-free, low-fat, or reduced-fat cheeses. Nonfat, low-sodium ricotta or cottage cheese. Low-fat or nonfat yogurt. Low-fat, low-sodium cheese. Fats and oils Soft margarine without trans fats. Vegetable oil. Low-fat, reduced-fat, or light mayonnaise and salad dressings (reduced-sodium). Canola, safflower, olive, soybean, and sunflower oils. Avocado. Seasoning and other foods Herbs. Spices. Seasoning mixes without salt. Unsalted popcorn and pretzels. Fat-free sweets. What foods are not recommended? The items listed may not be a complete list. Talk with your dietitian about what dietary choices are best for you. Grains Baked goods made with fat, such as croissants, muffins, or some breads. Dry pasta or rice meal packs. Vegetables Creamed or fried vegetables. Vegetables in a cheese sauce. Regular canned vegetables (not low-sodium or reduced-sodium). Regular canned tomato sauce and paste (not low-sodium or reduced-sodium). Regular tomato and vegetable juice (not low-sodium or reduced-sodium). Angie Fava. Olives. Fruits Canned fruit in a light or heavy syrup. Fried fruit. Fruit in cream or butter sauce. Meat and other protein foods Fatty cuts of meat. Ribs. Fried meat. Berniece Salines. Sausage. Bologna and other processed lunch meats. Salami. Fatback. Hotdogs. Bratwurst. Salted nuts and seeds. Canned beans with added salt. Canned or smoked fish. Whole eggs or egg yolks. Chicken or  [place] with skin. Dairy Whole or 2% milk, cream, and half-and-half. Whole or full-fat cream cheese. Whole-fat or sweetened yogurt. Full-fat cheese. Nondairy creamers. Whipped toppings. Processed cheese and cheese spreads. Fats and oils Butter. Stick margarine. Lard. Shortening. Ghee. Bacon fat. Tropical oils, such as coconut, palm kernel, or palm oil. Seasoning and other foods Salted popcorn and pretzels. Onion salt, garlic salt, seasoned salt, table salt, and sea  salt. Worcestershire sauce. Tartar sauce. Barbecue sauce. Teriyaki sauce. Soy sauce, including reduced-sodium. Steak sauce. Canned and packaged gravies. Fish sauce. Oyster sauce. Cocktail sauce. Horseradish that you find on the shelf. Ketchup. Mustard. Meat flavorings and tenderizers. Bouillon cubes. Hot sauce and Tabasco sauce. Premade or packaged marinades. Premade or packaged taco seasonings. Relishes. Regular salad dressings. Where to find more information:  National Heart, Lung, and [place]: https://wilson-eaton.com/  American Heart Association: www.heart.org Summary  The DASH eating plan is a healthy eating plan that has been shown to reduce high blood pressure (hypertension). It may also reduce your risk for type 2 diabetes, heart disease, and stroke.  With the DASH eating plan, you should limit salt (sodium) intake to 2,300 mg a day. If you have hypertension, you may need to reduce your sodium intake to 1,500 mg a day.  When on the DASH eating plan, aim to eat more fresh fruits and vegetables, whole grains, lean proteins, low-fat dairy, and heart-healthy fats.  Work with your health care provider or diet and nutrition specialist (dietitian) to adjust your eating plan to your individual calorie needs. This information is not intended to replace advice given to you by your health care provider. Make sure you discuss any questions you have with your health care provider. Document Revised: 11/12/2017 Document Reviewed: 11/23/2016 Elsevier Patient Education  2020 [employer].

## 2020-05-14 ENCOUNTER — Other Ambulatory Visit: Payer: Self-pay

## 2020-05-14 DIAGNOSIS — I5030 Unspecified diastolic (congestive) heart failure: Secondary | ICD-10-CM

## 2020-05-14 DIAGNOSIS — E785 Hyperlipidemia, unspecified: Secondary | ICD-10-CM

## 2020-05-14 NOTE — Progress Notes (Signed)
Appt was on 05/08/2020 with Sara Beth Brown, PharmD.  

## 2020-05-16 ENCOUNTER — Encounter: Payer: Self-pay | Admitting: Family Medicine

## 2020-05-16 DIAGNOSIS — Z86718 Personal history of other venous thrombosis and embolism: Secondary | ICD-10-CM | POA: Diagnosis not present

## 2020-05-16 DIAGNOSIS — I2699 Other pulmonary embolism without acute cor pulmonale: Secondary | ICD-10-CM | POA: Diagnosis not present

## 2020-05-16 DIAGNOSIS — Z86711 Personal history of pulmonary embolism: Secondary | ICD-10-CM | POA: Diagnosis not present

## 2020-05-20 ENCOUNTER — Other Ambulatory Visit: Payer: Self-pay | Admitting: Cardiology

## 2020-05-20 ENCOUNTER — Encounter: Payer: Self-pay | Admitting: Family Medicine

## 2020-05-20 DIAGNOSIS — R911 Solitary pulmonary nodule: Secondary | ICD-10-CM | POA: Diagnosis not present

## 2020-05-21 ENCOUNTER — Other Ambulatory Visit: Payer: Self-pay

## 2020-05-21 DIAGNOSIS — Z86718 Personal history of other venous thrombosis and embolism: Secondary | ICD-10-CM | POA: Diagnosis not present

## 2020-05-21 DIAGNOSIS — I2699 Other pulmonary embolism without acute cor pulmonale: Secondary | ICD-10-CM | POA: Diagnosis not present

## 2020-05-21 DIAGNOSIS — Z86711 Personal history of pulmonary embolism: Secondary | ICD-10-CM | POA: Diagnosis not present

## 2020-05-21 MED ORDER — LABETALOL HCL 300 MG PO TABS: 300.0000 mg | ORAL_TABLET | Freq: Two times a day (BID) | ORAL | 0 refills | Status: DC

## 2020-05-22 ENCOUNTER — Other Ambulatory Visit: Payer: Self-pay

## 2020-05-22 MED ORDER — LISINOPRIL 20 MG PO TABS: 20.0000 mg | ORAL_TABLET | Freq: Two times a day (BID) | ORAL | 0 refills | Status: DC

## 2020-05-22 NOTE — Telephone Encounter (Signed)
Pt switch provider. Pt now sees Dr. Agustin Cree. Please address

## 2020-05-23 DIAGNOSIS — J439 Emphysema, unspecified: Secondary | ICD-10-CM

## 2020-05-23 DIAGNOSIS — I2699 Other pulmonary embolism without acute cor pulmonale: Secondary | ICD-10-CM

## 2020-05-23 DIAGNOSIS — I712 Thoracic aortic aneurysm, without rupture: Secondary | ICD-10-CM

## 2020-05-23 DIAGNOSIS — I080 Rheumatic disorders of both mitral and aortic valves: Secondary | ICD-10-CM

## 2020-05-23 NOTE — Progress Notes (Signed)
Subjective:  Patient ID: Marcus King, male    DOB: 1941-10-02  Age: 79 y.o. MRN: [id number]  Chief Complaint  Patient presents with  . Hyperlipidemia  . Hypertension  . Anxiety  . Congestive Heart Failure    HPI   Patient is a 79 year old white male who was recently in the hospital for pulmonary embolisms and DVTs.  At that time a lesion in his lungs was noted and his PSA was elevated in the setting of previous prostate cancer status post prostatectomy.  A PET scan was completed 2 days ago. Ordered mainly due to the lung lesion.  The PET scan was noted to have inguinal lymphadenopathy and an area on the side of the rectum that lit up.  The lesion in the lung in fact did not light up with increased activity.  The patient does note that he has seen a change in his bowel movement shape where one side is flatter.  The PET scan is concerning for possible primary rectal cancer.  The patient also has an appointment with Dr. Jeffie Pollock for the elevated PSA.  He saw Dr. Bobby Rumpf on May 21, 2020.  Dr. Bobby Rumpf arranged appointment with Dr. Lilia Pro who will evaluate and determine the best place for biopsy. His history and work up are concerning for prostate cancer and rectal cancer.  In terms of his pulmonary embolism his breathing is much better.  He is not having dyspnea on exertion.  He is still wearing 2 L of oxygen at night.  He denies any leg pain where the blood clot is.    Hypertension has been an issue for him.  He checks his blood pressure 5-6 times per day and it ranges anywhere from 027-253 systolic.  Is been quite sometime since he had his blood pressure and here to compare with ours.  He does recall that his blood pressure monitor ran a little higher than ours whenever he did bring it in.  His blood pressure is good today. His current cardiac medication regimen includes a diuretic ( furosemide 20 mg once daily ), a beta-blocker ( carvedilol 25 mg i po bid ), a calcium channel blocker ( amlodipine 5 mg once  daily) and lisinopril 20 mg one twice a day..   Current Outpatient Medications on File Prior to Visit  Medication Sig Dispense Refill  . hydrOXYzine (ATARAX/VISTARIL) 25 MG tablet Take 25 mg by mouth 3 (three) times daily as needed.    [name] amLODipine (NORVASC) 5 MG tablet Take 1 tablet (5 mg total) by mouth daily. 90 tablet 1  . apixaban (ELIQUIS) 5 MG TABS tablet Take 5 mg by mouth 2 (two) times daily.    . busPIRone (BUSPAR) 10 MG tablet TAKE ONE TABLET BY MOUTH AS NEEDED 90 tablet 1  . carvedilol (COREG) 25 MG tablet take 1 tablet (25 mg) by oral route 2 times per day with food 180 tablet 1  . furosemide (LASIX) 20 MG tablet 20 mg daily.    [name] lisinopril (ZESTRIL) 20 MG tablet Take 1 tablet (20 mg total) by mouth 2 (two) times daily. 180 tablet 0  . PARoxetine (PAXIL) 40 MG tablet Take 40 mg by mouth daily.     . pravastatin (PRAVACHOL) 10 MG tablet Take 1 tablet (10 mg total) by mouth at bedtime. 90 tablet 0   No current facility-administered medications on file prior to visit.   Past Medical History:  Diagnosis Date  . Anxiety   . Cervical disc disorder with myelopathy,  cervicothoracic region   . Chronic pain of left knee 08/11/2016  . Food poisoning   . GAD (generalized anxiety disorder)   . Headache    sinus  . Hypertension   . Mixed hyperlipidemia   . Orthostatic hypotension   . OSA on CPAP   . Prostate cancer ([place])   . 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 CARDIAC CATHETERIZATION    . CATARACT EXTRACTION W/ INTRAOCULAR LENS  IMPLANT, BILATERAL Bilateral   . COLON SURGERY  2014  . COLOSTOMY REVERSAL  2014  . HERNIA REPAIR     x2  . JOINT REPLACEMENT    . PROSTATE SURGERY  2006  . SEPTOPLASTY    . TOTAL KNEE ARTHROPLASTY Left 09/28/2016   Procedure: TOTAL KNEE ARTHROPLASTY;  Surgeon: [name], MD;  Location: [place] [facility];  Service: Orthopedics;  Laterality: Left;    Family History  Problem  Relation Age of Onset  . Stroke Mother   . Parkinson's disease Mother   . Lung disease Father   . Stroke Father   . Alcoholism Father   . Leukemia Sister   . Muscular dystrophy Son        Immunologist  . Alzheimer's disease Neg Hx    Social History   Socioeconomic History  . Marital status: Married    Spouse name: Not on file  . Number of children: 2  . Years of education: Not on file  . Highest education level: High school graduate  Occupational History  . Occupation: Engineer, petroleum    Comment: ICDs/Wound Care  Tobacco Use  . Smoking status: Former Smoker    Years: 20.00    Types: Cigarettes    Quit date: 1979    Years since quitting: 42.4  . Smokeless tobacco: Never Used  . Tobacco comment: quit 37 years ago  Vaping Use  . Vaping Use: Never used  Substance and Sexual Activity  . Alcohol use: No  . Drug use: No  . Sexual activity: Not on file  Other Topics Concern  . Not on file  Social History Narrative   LIves at home with his wife   Self employed   Right handed   Drinks about 2 cups of caffeine daily   Social Determinants of Health   Financial Resource Strain:   . Difficulty of Paying Living Expenses:   Food Insecurity: No Food Insecurity  . Worried About Charity fundraiser in the Last Year: Never true  . Ran Out of Food in the Last Year: Never true  Transportation Needs:   . Lack of Transportation (Medical):   [name] Lack of Transportation (Non-Medical):   Physical Activity:   . Days of Exercise per Week:   . Minutes of Exercise per Session:   Stress:   . Feeling of Stress :   Social Connections:   . Frequency of Communication with Friends and Family:   . Frequency of Social Gatherings with Friends and Family:   . Attends Religious Services:   . Active Member of Clubs or Organizations:   . Attends Archivist Meetings:   [name] Marital Status:     Review of Systems  Constitutional: Positive for fatigue. Negative for chills and fever.  HENT:  Negative for congestion, ear pain and sore throat.   Respiratory: Negative for cough and shortness of breath.   Cardiovascular: Negative for chest pain and palpitations.  Gastrointestinal: Positive for anal  bleeding, blood in stool and rectal pain. Negative for abdominal pain, constipation and diarrhea.  Genitourinary: Positive for frequency. Negative for dysuria.  Neurological: Negative for dizziness and headaches.  Psychiatric/Behavioral: Negative for dysphoric mood. The patient is nervous/anxious.      Objective:  BP (!) 90/56   Pulse 72   Temp 97.8 F (36.6 C)   Resp 16   Ht 5\' 10"  (1.778 m)   Wt 184 lb 9.6 oz (83.7 kg)   BMI 26.49 kg/m   BP/Weight 05/24/2020 05/02/2020 4/40/3474  Systolic BP 90 259 563  Diastolic BP 56 68 78  Wt. (Lbs) 184.6 181 183  BMI 26.49 25.97 26.26    Physical Exam Vitals reviewed.  Constitutional:      Appearance: He is normal weight.  Neck:     Vascular: No carotid bruit.  Cardiovascular:     Rate and Rhythm: Normal rate and regular rhythm.  Pulmonary:     Effort: Pulmonary effort is normal. No respiratory distress.     Breath sounds: Normal breath sounds.  Abdominal:     General: Bowel sounds are normal.     Palpations: Abdomen is soft.     Tenderness: There is no abdominal tenderness.  Neurological:     Mental Status: He is alert and oriented to person, place, and time.  Psychiatric:        Mood and Affect: Mood normal.        Behavior: Behavior normal.     Diabetic Foot Exam - Simple   No data filed       Lab Results  Component Value Date   WBC 7.6 01/09/2019   HGB 13.6 01/09/2019   HCT 41.6 01/09/2019   PLT 255 01/09/2019   GLUCOSE 100 (H) 01/09/2019   ALT 14 01/09/2019   AST 22 01/09/2019   NA 137 01/09/2019   K 3.9 01/09/2019   CL 100 01/09/2019   CREATININE 1.21 01/09/2019   BUN 17 01/09/2019   CO2 28 01/09/2019   INR 1.03 01/09/2019   HGBA1C 5.6 12/01/2017      Assessment & Plan:   1. Prostate cancer  [facility]) Keep appt with Dr. Jeffie Pollock  2. Rectal mass/Inguinal lymphadenopathy Patient scheduled to see Dr. Lilia Pro later this morning. Biopsy is the next step.  3. Hypertensive heart disease with congestive heart failure, unspecified heart failure type (HCC) Bp is quite low today. Patients bp log shows Bps are higher. Check BP twice a day (no more) Patient instructed to not make changes in his medicines with out discussing it with me (or his cardiologist.)  4. GAD  The current medical regimen is effective;  continue present plan and medications.  5. Dyslipidemia Low fat diet.  6. Pulmonary embolism and infarction ([place]) Continue eliquis.  7. Solid nodule of lung greater than 8 mm in diameter Noncancerous.    Follow-up: Return in about 2 months (around 07/24/2020).  An After Visit Summary was printed and given to the patient.  Rochel Brome [facility] [phone]

## 2020-05-24 ENCOUNTER — Encounter: Payer: Self-pay | Admitting: Family Medicine

## 2020-05-24 ENCOUNTER — Other Ambulatory Visit: Payer: Self-pay

## 2020-05-24 ENCOUNTER — Ambulatory Visit (INDEPENDENT_AMBULATORY_CARE_PROVIDER_SITE_OTHER): Payer: Medicare Other | Admitting: Family Medicine

## 2020-05-24 VITALS — BP 90/56 | HR 72 | Temp 97.8°F | Resp 16 | Ht 70.0 in | Wt 184.6 lb

## 2020-05-24 DIAGNOSIS — F411 Generalized anxiety disorder: Secondary | ICD-10-CM

## 2020-05-24 DIAGNOSIS — R911 Solitary pulmonary nodule: Secondary | ICD-10-CM

## 2020-05-24 DIAGNOSIS — C61 Malignant neoplasm of prostate: Secondary | ICD-10-CM | POA: Diagnosis not present

## 2020-05-24 DIAGNOSIS — K6289 Other specified diseases of anus and rectum: Secondary | ICD-10-CM | POA: Insufficient documentation

## 2020-05-24 DIAGNOSIS — I2699 Other pulmonary embolism without acute cor pulmonale: Secondary | ICD-10-CM

## 2020-05-24 DIAGNOSIS — R590 Localized enlarged lymph nodes: Secondary | ICD-10-CM

## 2020-05-24 DIAGNOSIS — E785 Hyperlipidemia, unspecified: Secondary | ICD-10-CM

## 2020-05-24 DIAGNOSIS — I110 Hypertensive heart disease with heart failure: Secondary | ICD-10-CM

## 2020-05-24 DIAGNOSIS — C7989 Secondary malignant neoplasm of other specified sites: Secondary | ICD-10-CM | POA: Insufficient documentation

## 2020-05-24 HISTORY — DX: Other specified diseases of anus and rectum: K62.89

## 2020-05-26 DIAGNOSIS — R590 Localized enlarged lymph nodes: Secondary | ICD-10-CM

## 2020-05-26 DIAGNOSIS — R911 Solitary pulmonary nodule: Secondary | ICD-10-CM | POA: Insufficient documentation

## 2020-05-26 HISTORY — DX: Solitary pulmonary nodule: R91.1

## 2020-05-26 HISTORY — DX: Localized enlarged lymph nodes: R59.0

## 2020-05-29 DIAGNOSIS — I82402 Acute embolism and thrombosis of unspecified deep veins of left lower extremity: Secondary | ICD-10-CM | POA: Diagnosis not present

## 2020-05-29 DIAGNOSIS — J439 Emphysema, unspecified: Secondary | ICD-10-CM | POA: Diagnosis not present

## 2020-05-29 DIAGNOSIS — E785 Hyperlipidemia, unspecified: Secondary | ICD-10-CM | POA: Diagnosis not present

## 2020-05-29 DIAGNOSIS — I712 Thoracic aortic aneurysm, without rupture: Secondary | ICD-10-CM | POA: Diagnosis not present

## 2020-05-29 DIAGNOSIS — I2699 Other pulmonary embolism without acute cor pulmonale: Secondary | ICD-10-CM | POA: Diagnosis not present

## 2020-05-29 DIAGNOSIS — Z7901 Long term (current) use of anticoagulants: Secondary | ICD-10-CM | POA: Diagnosis not present

## 2020-05-29 DIAGNOSIS — I10 Essential (primary) hypertension: Secondary | ICD-10-CM | POA: Diagnosis not present

## 2020-05-29 DIAGNOSIS — Z9981 Dependence on supplemental oxygen: Secondary | ICD-10-CM | POA: Diagnosis not present

## 2020-05-29 DIAGNOSIS — Z8546 Personal history of malignant neoplasm of prostate: Secondary | ICD-10-CM | POA: Diagnosis not present

## 2020-05-29 DIAGNOSIS — Z87891 Personal history of nicotine dependence: Secondary | ICD-10-CM | POA: Diagnosis not present

## 2020-05-29 DIAGNOSIS — F419 Anxiety disorder, unspecified: Secondary | ICD-10-CM | POA: Diagnosis not present

## 2020-05-29 DIAGNOSIS — G4733 Obstructive sleep apnea (adult) (pediatric): Secondary | ICD-10-CM | POA: Diagnosis not present

## 2020-05-29 DIAGNOSIS — R918 Other nonspecific abnormal finding of lung field: Secondary | ICD-10-CM | POA: Diagnosis not present

## 2020-05-29 DIAGNOSIS — I080 Rheumatic disorders of both mitral and aortic valves: Secondary | ICD-10-CM | POA: Diagnosis not present

## 2020-05-29 DIAGNOSIS — F329 Major depressive disorder, single episode, unspecified: Secondary | ICD-10-CM | POA: Diagnosis not present

## 2020-06-07 ENCOUNTER — Other Ambulatory Visit: Payer: Self-pay | Admitting: Family Medicine

## 2020-06-07 DIAGNOSIS — C61 Malignant neoplasm of prostate: Secondary | ICD-10-CM | POA: Diagnosis not present

## 2020-06-07 DIAGNOSIS — C774 Secondary and unspecified malignant neoplasm of inguinal and lower limb lymph nodes: Secondary | ICD-10-CM | POA: Diagnosis not present

## 2020-06-12 ENCOUNTER — Ambulatory Visit (INDEPENDENT_AMBULATORY_CARE_PROVIDER_SITE_OTHER): Payer: Medicare Other | Admitting: Cardiology

## 2020-06-12 ENCOUNTER — Encounter: Payer: Self-pay | Admitting: Cardiology

## 2020-06-12 ENCOUNTER — Other Ambulatory Visit: Payer: Self-pay

## 2020-06-12 VITALS — BP 150/68 | HR 60 | Ht 70.0 in | Wt 189.8 lb

## 2020-06-12 DIAGNOSIS — I951 Orthostatic hypotension: Secondary | ICD-10-CM

## 2020-06-12 DIAGNOSIS — R0609 Other forms of dyspnea: Secondary | ICD-10-CM

## 2020-06-12 DIAGNOSIS — Z86711 Personal history of pulmonary embolism: Secondary | ICD-10-CM

## 2020-06-12 DIAGNOSIS — R0600 Dyspnea, unspecified: Secondary | ICD-10-CM

## 2020-06-12 DIAGNOSIS — R0902 Hypoxemia: Secondary | ICD-10-CM

## 2020-06-12 DIAGNOSIS — C61 Malignant neoplasm of prostate: Secondary | ICD-10-CM | POA: Diagnosis not present

## 2020-06-12 NOTE — Patient Instructions (Signed)

## 2020-06-13 ENCOUNTER — Telehealth: Payer: Self-pay | Admitting: Emergency Medicine

## 2020-06-13 DIAGNOSIS — Z86711 Personal history of pulmonary embolism: Secondary | ICD-10-CM

## 2020-06-13 HISTORY — DX: Personal history of pulmonary embolism: Z86.711

## 2020-06-13 NOTE — Progress Notes (Signed)
Cardiology Office Note:    Date:  06/13/2020   ID:  [name], DOB 07-21-1941, MRN [id number]  PCP:  Rochel Brome, MD  Cardiologist:  Jenne Campus, MD    Referring MD: Rochel Brome, MD   No chief complaint on file. Doing much better, shortness of breath improved  History of Present Illness:    Zalyn Amend is a 79 y.o. male with past medical history significant for orthostatic hypotension, essential hypertension, obstructive sleep apnea, in March he ended going to the hospital because swelling of lower extremities as well as shortness of breath.  He was identified to have DVT as well as submassive PE both sides however, telemetry he was stable no thrombolytic therapy has been given.  After that he was found to have high PSA and diagnosis of prostate cancer has been established.  My understanding is that plan is to initiate chemotherapy.  No talks about surgery.  He is overall doing much better.  He is feeling better in terms of shortness of breath.  Still gets some swelling of lower extremities but very minimal, does not use oxygen during the day he does have pulse oximetry and check his pulse oximetry quite often and numbers are typically more than 95%.  Denies having any recent episode of syncope.  He check his blood pressure on the regular basis and blood pressure is reasonable.  Past Medical History:  Diagnosis Date  . Anxiety   . Cervical disc disorder with myelopathy, cervicothoracic region   . Chronic pain of left knee 08/11/2016  . Food poisoning   . GAD (generalized anxiety disorder)   . Headache    sinus  . Hypertension   . Mixed hyperlipidemia   . Orthostatic hypotension   . OSA on CPAP   . Prostate cancer ([place])   . 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 CARDIAC CATHETERIZATION    . CATARACT EXTRACTION W/ INTRAOCULAR LENS  IMPLANT, BILATERAL Bilateral   . COLON SURGERY  2014  .  COLOSTOMY REVERSAL  2014  . HERNIA REPAIR     x2  . JOINT REPLACEMENT    . PROSTATE SURGERY  2006  . SEPTOPLASTY    . TOTAL KNEE ARTHROPLASTY Left 09/28/2016   Procedure: TOTAL KNEE ARTHROPLASTY;  Surgeon: [name], MD;  Location: [place];  Service: Orthopedics;  Laterality: Left;    Current Medications: Current Meds  Medication Sig  . amLODipine (NORVASC) 5 MG tablet Take 1 tablet (5 mg total) by mouth daily.  [name] apixaban (ELIQUIS) 5 MG TABS tablet Take 5 mg by mouth 2 (two) times daily.  . busPIRone (BUSPAR) 10 MG tablet TAKE ONE TABLET BY MOUTH AS NEEDED  . carvedilol (COREG) 25 MG tablet take 1 tablet (25 mg) by oral route 2 times per day with food  . furosemide (LASIX) 20 MG tablet take 1 tablet (20 mg) by oral route once daily  . hydrOXYzine (ATARAX/VISTARIL) 25 MG tablet Take 25 mg by mouth 3 (three) times daily as needed.  [name] PARoxetine (PAXIL) 40 MG tablet Take 40 mg by mouth daily.   . pravastatin (PRAVACHOL) 10 MG tablet Take 1 tablet (10 mg total) by mouth at bedtime.     Allergies:   Beef-derived products, Clonazepam, Monosodium glutamate, and Pegademase bovine   Social History   Socioeconomic History  . Marital status: Married    Spouse name: Not on file  . Number of  children: 2  . Years of education: Not on file  . Highest education level: High school graduate  Occupational History  . Occupation: Engineer, petroleum    Comment: ICDs/Wound Care  Tobacco Use  . Smoking status: Former Smoker    Years: 20.00    Types: Cigarettes    Quit date: 1979    Years since quitting: 42.5  . Smokeless tobacco: Never Used  . Tobacco comment: quit 37 years ago  Vaping Use  . Vaping Use: Never used  Substance and Sexual Activity  . Alcohol use: No  . Drug use: No  . Sexual activity: Not on file  Other Topics Concern  . Not on file  Social History Narrative   LIves at home with his wife   Self employed   Right handed   Drinks about 2 cups of caffeine daily   Social  Determinants of Health   Financial Resource Strain:   . Difficulty of Paying Living Expenses:   Food Insecurity: No Food Insecurity  . Worried About Charity fundraiser in the Last Year: Never true  . Ran Out of Food in the Last Year: Never true  Transportation Needs:   . Lack of Transportation (Medical):   [name] Lack of Transportation (Non-Medical):   Physical Activity:   . Days of Exercise per Week:   . Minutes of Exercise per Session:   Stress:   . Feeling of Stress :   Social Connections:   . Frequency of Communication with Friends and Family:   . Frequency of Social Gatherings with Friends and Family:   . Attends Religious Services:   . Active Member of Clubs or Organizations:   . Attends Archivist Meetings:   [name] Marital Status:      Family History: The patient's family history includes Alcoholism in his father; Leukemia in his sister; Lung disease in his father; Muscular dystrophy in his son; Parkinson's disease in his mother; Stroke in his father and mother. There is no history of Alzheimer's disease. ROS:   Please see the history of present illness.    All 14 point review of systems negative except as described per history of present illness  EKGs/Labs/Other Studies Reviewed:      Recent Labs: No results found for requested labs within last 8760 hours.  Recent Lipid Panel No results found for: CHOL, TRIG, HDL, CHOLHDL, VLDL, LDLCALC, LDLDIRECT  Physical Exam:    VS:  BP (!) 150/68 (BP Location: Left Arm, Patient Position: Sitting, Cuff Size: Normal)   Pulse 60   Ht 5\' 10"  (1.778 m)   Wt 189 lb 12.8 oz (86.1 kg)   SpO2 96%   BMI 27.23 kg/m     Wt Readings from Last 3 Encounters:  06/12/20 189 lb 12.8 oz (86.1 kg)  05/24/20 184 lb 9.6 oz (83.7 kg)  05/02/20 181 lb (82.1 kg)     GEN:  Well nourished, well developed in no acute distress HEENT: Normal NECK: No JVD; No carotid bruits LYMPHATICS: No lymphadenopathy CARDIAC: RRR, no murmurs, no rubs,  no gallops RESPIRATORY:  Clear to auscultation without rales, wheezing or rhonchi  ABDOMEN: Soft, non-tender, non-distended MUSCULOSKELETAL:  No edema; No deformity  SKIN: Warm and dry LOWER EXTREMITIES: no swelling NEUROLOGIC:  Alert and oriented x 3 PSYCHIATRIC:  Normal affect   ASSESSMENT:    1. Prostate cancer ([place])   2. History of pulmonary embolus (PE)   3. Hypoxia   4. Orthostatic hypotension   5. Dyspnea  on exertion    PLAN:    In order of problems listed above:  1. Prostate cancer.  Noted followed by urology.  He will be initiated chemotherapy. 2. History of pulmonary emboli.  He seems to be doing well.  He is echocardiogram will be repeated within the next few weeks.  Obviously is anticoagulated which I will continue. 3. Hypoxia related to pulmonary emboli.  But now his oxygen saturation is decent. 4. Orthostatic hypotension denies having any recent big problem from that point review.  I told him if he still low blood pressure he need to drink some extra fluids and maybe even take some extra salty food.  He understand he will try to do.   5. Dyspnea on exertion: Multifactorial.   Medication Adjustments/Labs and Tests Ordered: Current medicines are reviewed at length with the patient today.  Concerns regarding medicines are outlined above.  No orders of the defined types were placed in this encounter.  Medication changes: No orders of the defined types were placed in this encounter.   Signed, Park Liter, MD, [facility] 06/13/2020 8:09 AM    [place]

## 2020-06-13 NOTE — Telephone Encounter (Signed)
Called patient informed him per Dr. Agustin Cree that the medications that he was asking Dr. Agustin Cree about are ok today. I did let him know that apalutamide can cause blood pressure to become high and he needs to keep a watch on this and let us know if this happens. Patient verbally understood, he thanked me for the call. No further questions.

## 2020-06-21 DIAGNOSIS — Z86711 Personal history of pulmonary embolism: Secondary | ICD-10-CM | POA: Diagnosis not present

## 2020-06-21 DIAGNOSIS — C61 Malignant neoplasm of prostate: Secondary | ICD-10-CM | POA: Diagnosis not present

## 2020-06-21 DIAGNOSIS — C778 Secondary and unspecified malignant neoplasm of lymph nodes of multiple regions: Secondary | ICD-10-CM | POA: Diagnosis not present

## 2020-06-21 DIAGNOSIS — Z86718 Personal history of other venous thrombosis and embolism: Secondary | ICD-10-CM | POA: Diagnosis not present

## 2020-07-04 DIAGNOSIS — Z86718 Personal history of other venous thrombosis and embolism: Secondary | ICD-10-CM | POA: Diagnosis not present

## 2020-07-04 DIAGNOSIS — Z86711 Personal history of pulmonary embolism: Secondary | ICD-10-CM | POA: Diagnosis not present

## 2020-07-04 DIAGNOSIS — C778 Secondary and unspecified malignant neoplasm of lymph nodes of multiple regions: Secondary | ICD-10-CM | POA: Diagnosis not present

## 2020-07-04 DIAGNOSIS — C61 Malignant neoplasm of prostate: Secondary | ICD-10-CM | POA: Diagnosis not present

## 2020-07-05 DIAGNOSIS — C61 Malignant neoplasm of prostate: Secondary | ICD-10-CM | POA: Diagnosis not present

## 2020-07-05 DIAGNOSIS — C774 Secondary and unspecified malignant neoplasm of inguinal and lower limb lymph nodes: Secondary | ICD-10-CM | POA: Diagnosis not present

## 2020-07-13 ENCOUNTER — Other Ambulatory Visit: Payer: Self-pay | Admitting: Family Medicine

## 2020-07-18 DIAGNOSIS — C61 Malignant neoplasm of prostate: Secondary | ICD-10-CM | POA: Diagnosis not present

## 2020-07-18 DIAGNOSIS — C774 Secondary and unspecified malignant neoplasm of inguinal and lower limb lymph nodes: Secondary | ICD-10-CM | POA: Diagnosis not present

## 2020-07-19 ENCOUNTER — Other Ambulatory Visit: Payer: Self-pay | Admitting: Family Medicine

## 2020-07-22 DIAGNOSIS — C774 Secondary and unspecified malignant neoplasm of inguinal and lower limb lymph nodes: Secondary | ICD-10-CM | POA: Diagnosis not present

## 2020-07-22 DIAGNOSIS — C61 Malignant neoplasm of prostate: Secondary | ICD-10-CM | POA: Diagnosis not present

## 2020-07-22 DIAGNOSIS — Z7189 Other specified counseling: Secondary | ICD-10-CM | POA: Diagnosis not present

## 2020-07-24 ENCOUNTER — Encounter: Payer: Self-pay | Admitting: Neurology

## 2020-07-29 ENCOUNTER — Encounter: Payer: Self-pay | Admitting: Adult Health

## 2020-07-29 ENCOUNTER — Ambulatory Visit (INDEPENDENT_AMBULATORY_CARE_PROVIDER_SITE_OTHER): Payer: Medicare Other | Admitting: Adult Health

## 2020-07-29 ENCOUNTER — Other Ambulatory Visit: Payer: Self-pay | Admitting: Family Medicine

## 2020-07-29 VITALS — BP 146/88 | HR 86 | Ht 70.0 in | Wt 193.0 lb

## 2020-07-29 DIAGNOSIS — G4733 Obstructive sleep apnea (adult) (pediatric): Secondary | ICD-10-CM

## 2020-07-29 DIAGNOSIS — C61 Malignant neoplasm of prostate: Secondary | ICD-10-CM

## 2020-07-29 NOTE — Progress Notes (Signed)
psa order put in. kc

## 2020-07-29 NOTE — Progress Notes (Signed)
PATIENT: Marcus King DOB: 1941-04-23  REASON FOR VISIT: follow up HISTORY FROM: patient  HISTORY OF PRESENT ILLNESS: Today 07/29/20:  Marcus King is an 79 year old male with a history of obstructive sleep apnea on BiPAP.  He returns today for follow-up.  His download indicates that he use his machine nightly for compliance of 100%.  He uses machine greater than 4 hours each night.  On average he uses his machine 8 hours and 20 minutes.  His residual AHI is 6.3 on 18/14 centimeters of water.  He reports that he does not like his mask.  And would like to try different one.  He reports that since the last visit he was put on supplemental O2 at night, Eliquis due to blood thinners and diagnosed with prostate cancer.  He returns today for an evaluation.   REVIEW OF SYSTEMS: Out of a complete 14 system review of symptoms, the patient complains only of the following symptoms, and all other reviewed systems are negative.  FSS 57 ESS 7  ALLERGIES: Allergies  Allergen Reactions   Beef-Derived Products Diarrhea   Clonazepam Other (See Comments)    High doses cause drunk feeling (>1 mg)   Monosodium Glutamate Other (See Comments)    MSG (soups and oriental foods)   Pegademase Bovine Diarrhea    HOME MEDICATIONS: Outpatient Medications Prior to Visit  Medication Sig Dispense Refill   amLODipine (NORVASC) 5 MG tablet Take 1 tablet (5 mg total) by mouth daily. 90 tablet 1   apixaban (ELIQUIS) 5 MG TABS tablet Take 5 mg by mouth 2 (two) times daily.     busPIRone (BUSPAR) 10 MG tablet TAKE ONE TABLET BY MOUTH AS NEEDED 90 tablet 1   carvedilol (COREG) 25 MG tablet take 1 tablet (25 mg) by oral route 2 times per day with food 180 tablet 1   darolutamide (NUBEQA) 300 MG tablet Take 600 mg by mouth 2 (two) times daily with a meal.     furosemide (LASIX) 20 MG tablet take 1 tablet (20 mg) by oral route once daily 90 tablet 1   hydrOXYzine (ATARAX/VISTARIL) 25 MG tablet TAKE 1-2  TABLETS BY MOUTH DAILY AS NEEDED FOR ITCHING 60 tablet 2   labetalol (NORMODYNE) 300 MG tablet Take 300 mg by mouth 2 (two) times daily.     Leuprolide Acetate, 3 Month, (LUPRON DEPOT, 55-MONTH, IM) Inject into the muscle.     LISINOPRIL PO Take 20 mg by mouth in the morning and at bedtime.     PARoxetine (PAXIL) 40 MG tablet TAKE ONE TABLET BY MOUTH EVERY DAY 90 tablet 2   pravastatin (PRAVACHOL) 10 MG tablet Take 1 tablet (10 mg total) by mouth at bedtime. 90 tablet 0   No facility-administered medications prior to visit.    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    PAST SURGICAL HISTORY: Past Surgical History:  Procedure Laterality Date   CARDIAC CATHETERIZATION     CATARACT EXTRACTION W/ INTRAOCULAR LENS  IMPLANT, BILATERAL Bilateral  COLON SURGERY  2014   COLOSTOMY REVERSAL  2014   HERNIA REPAIR     x2   JOINT REPLACEMENT     PROSTATE SURGERY  2006   SEPTOPLASTY     TOTAL KNEE ARTHROPLASTY Left 09/28/2016   Procedure: TOTAL KNEE ARTHROPLASTY;  Surgeon: [name], MD;  Location: [facility];  Service: Orthopedics;  Laterality: Left;    FAMILY HISTORY: Family History  Problem Relation Age of Onset   Stroke Mother    Parkinson's disease Mother    Lung disease Father    Stroke Father    Alcoholism Father    Leukemia Sister    Muscular dystrophy Son        Becker's   Alzheimer's disease Neg Hx     SOCIAL HISTORY: Social History   Socioeconomic History   Marital status: Married    Spouse name: Not on file   Number of children: 2   Years of  education: Not on file   Highest education level: High school graduate  Occupational History   Occupation: Engineer, petroleum    Comment: ICDs/Wound Care  Tobacco Use   Smoking status: Former Smoker    Years: 20.00    Types: Cigarettes    Quit date: 1979    Years since quitting: 42.6   Smokeless tobacco: Never Used   Tobacco comment: quit 37 years ago  Media planner   Vaping Use: Never used  Substance and Sexual Activity   Alcohol use: No   Drug use: No   Sexual activity: Not on file  Other Topics Concern   Not on file  Social History Narrative   LIves at home with his wife   Self employed   Right handed   Drinks about 2 cups of caffeine daily   Social Determinants of Health   Financial Resource Strain:    Difficulty of Paying Living Expenses:   Food Insecurity: No Food Insecurity   Worried About Charity fundraiser in the Last Year: Never true   Arboriculturist in the Last Year: Never true  Transportation Needs:    Film/video editor (Medical):    Lack of Transportation (Non-Medical):   Physical Activity:    Days of Exercise per Week:    Minutes of Exercise per Session:   Stress:    Feeling of Stress :   Social Connections:    Frequency of Communication with Friends and Family:    Frequency of Social Gatherings with Friends and Family:    Attends Religious Services:    Active Member of Clubs or Organizations:    Attends Archivist Meetings:    Marital Status:   Intimate Partner Violence:    Fear of Current or Ex-Partner:    Emotionally Abused:    Physically Abused:    Sexually Abused:       PHYSICAL EXAM  Vitals:   07/29/20 1118  BP: (!) 146/88  Pulse: 86  Weight: 193 lb (87.5 kg)  Height: 5\' 10"  (1.778 m)   Body mass index is 27.69 kg/m.  Generalized: Well developed, in no acute distress  Chest: Lungs clear to auscultation bilaterally  Neurological examination  Mentation: Alert oriented to time, place,  history taking. Follows all commands speech and language fluent Cranial nerve II-XII: Extraocular movements were full, visual field were full on confrontational test Head turning and shoulder shrug  were normal and symmetric. Motor: The motor testing reveals 5 over 5 strength of all 4 extremities. Good symmetric  motor tone is noted throughout.  Sensory: Sensory testing is intact to soft touch on all 4 extremities. No evidence of extinction is noted.  Gait and station: Gait is normal.    DIAGNOSTIC DATA (LABS, IMAGING, TESTING) - I reviewed patient records, labs, notes, testing and imaging myself where available.  Lab Results  Component Value Date   WBC 7.6 01/09/2019   HGB 13.6 01/09/2019   HCT 41.6 01/09/2019   MCV 89.7 01/09/2019   PLT 255 01/09/2019      Component Value Date/Time   NA 137 01/09/2019 2020   NA 141 10/11/2018 1235   K 3.9 01/09/2019 2020   CL 100 01/09/2019 2020   CO2 28 01/09/2019 2020   GLUCOSE 100 (H) 01/09/2019 2020   BUN 17 01/09/2019 2020   BUN 22 10/11/2018 1235   CREATININE 1.21 01/09/2019 2020   CALCIUM 9.5 01/09/2019 2020   PROT 6.1 (L) 01/09/2019 2020   ALBUMIN 3.5 01/09/2019 2020   AST 22 01/09/2019 2020   ALT 14 01/09/2019 2020   ALKPHOS 66 01/09/2019 2020   BILITOT 0.7 01/09/2019 2020   GFRNONAA 57 (L) 01/09/2019 2020   GFRAA >60 01/09/2019 2020   No results found for: CHOL, HDL, LDLCALC, LDLDIRECT, TRIG, CHOLHDL Lab Results  Component Value Date   HGBA1C 5.6 12/01/2017   Lab Results  Component Value Date   VITAMINB12 257 12/01/2017   No results found for: TSH    ASSESSMENT AND PLAN 79 y.o. year old male  has a past medical history of Anxiety, Cervical disc disorder with myelopathy, cervicothoracic region, Chronic pain of left knee (08/11/2016), Food poisoning, GAD (generalized anxiety disorder), Headache, Hypertension, Mixed hyperlipidemia, Orthostatic hypotension, OSA on CPAP, Prostate cancer ([place]), and S/P TKR (total knee  replacement) using cement, left (09/28/2016). here with:  1. OSA on BiPAP  - BiPAP compliance excellent - Good treatment of AHI  - Encourage patient to use CPAP nightly and > 4 hours each night -Order placed for new mask - F/U in 1 year or sooner if needed   I spent 20 minutes of face-to-face and non-face-to-face time with patient.  This included previsit chart review, lab review, study review, order entry, electronic health record documentation, patient education.  Ward Givens, MSN, NP-C 07/29/2020, 12:47 PM [facility] [address], [place],  [id number] [phone]

## 2020-07-30 ENCOUNTER — Other Ambulatory Visit: Payer: Self-pay

## 2020-07-30 MED ORDER — APIXABAN 5 MG PO TABS: 5.0000 mg | ORAL_TABLET | Freq: Two times a day (BID) | ORAL | 3 refills | Status: DC

## 2020-08-02 ENCOUNTER — Ambulatory Visit (INDEPENDENT_AMBULATORY_CARE_PROVIDER_SITE_OTHER): Payer: Medicare Other | Admitting: Family Medicine

## 2020-08-02 ENCOUNTER — Other Ambulatory Visit: Payer: Self-pay

## 2020-08-02 ENCOUNTER — Encounter: Payer: Self-pay | Admitting: Family Medicine

## 2020-08-02 VITALS — BP 130/68 | HR 80 | Temp 97.4°F | Ht 70.0 in | Wt 186.0 lb

## 2020-08-02 DIAGNOSIS — C61 Malignant neoplasm of prostate: Secondary | ICD-10-CM

## 2020-08-02 DIAGNOSIS — I110 Hypertensive heart disease with heart failure: Secondary | ICD-10-CM

## 2020-08-02 DIAGNOSIS — E785 Hyperlipidemia, unspecified: Secondary | ICD-10-CM

## 2020-08-02 DIAGNOSIS — I2699 Other pulmonary embolism without acute cor pulmonale: Secondary | ICD-10-CM | POA: Diagnosis not present

## 2020-08-02 DIAGNOSIS — R21 Rash and other nonspecific skin eruption: Secondary | ICD-10-CM | POA: Diagnosis not present

## 2020-08-02 DIAGNOSIS — F419 Anxiety disorder, unspecified: Secondary | ICD-10-CM | POA: Diagnosis not present

## 2020-08-02 MED ORDER — PREDNISONE 50 MG PO TABS: 50.0000 mg | ORAL_TABLET | Freq: Every day | ORAL | 0 refills | Status: DC

## 2020-08-02 MED ORDER — FAMOTIDINE 40 MG PO TABS
40.0000 mg | ORAL_TABLET | Freq: Every day | ORAL | 2 refills | Status: DC
Start: 2020-08-02 — End: 2020-08-20

## 2020-08-02 MED ORDER — BUSPIRONE HCL 30 MG PO TABS: 30.0000 mg | ORAL_TABLET | Freq: Two times a day (BID) | ORAL | 3 refills | Status: DC

## 2020-08-02 MED ORDER — TRIAMCINOLONE ACETONIDE 40 MG/ML IJ SUSP
80.0000 mg | Freq: Once | INTRAMUSCULAR | Status: AC
  Administered 2020-08-02: 80 mg via INTRA_ARTICULAR

## 2020-08-02 NOTE — Progress Notes (Signed)
Subjective:  Patient ID: Marcus King, male    DOB: 1941-06-07  Age: 79 y.o. MRN: [id number]  Chief Complaint  Patient presents with   Hypertension   Anxiety   Hyperlipidemia    HPI   Prostate cancer- last PSA 0.6 (07/22/2020.) Improved with hormone treatment with lupron and darolutamide. His psa was over 9. He is being followed by Dr. Bobby Rumpf with oncology. He has developed a rash over his torso x 1 month. It is pruritic. It may have improved some when discontinue darolutamide for one week, but did not resolve. Denies any pain. He saw Dr. Orlene Plum per the patient he did not wish to give him a steroid shot.   In terms of his pulmonary embolism which occurred in May 2021, his breathing is good. He is no longer on oxygen. He is on eliquis.    Hypertension: His current cardiac medication regimen includes a diuretic ( furosemide 20 mg once daily ), a beta-blocker ( carvedilol 25 mg i po bid ), a calcium channel blocker ( amlodipine 5 mg once daily) labetolol 300 mg one twice a day, and lisinopril 20 mg one twice a day..   Patient continues to be very anxious. Denies depression. Currently on paxil, hydroxyzine.  Current Outpatient Medications on File Prior to Visit  Medication Sig Dispense Refill   amLODipine (NORVASC) 5 MG tablet Take 1 tablet (5 mg total) by mouth daily. 90 tablet 1   apixaban (ELIQUIS) 5 MG TABS tablet Take 1 tablet (5 mg total) by mouth 2 (two) times daily. 60 tablet 3   carvedilol (COREG) 25 MG tablet take 1 tablet (25 mg) by oral route 2 times per day with food 180 tablet 1   darolutamide (NUBEQA) 300 MG tablet Take 600 mg by mouth 2 (two) times daily with a meal.     furosemide (LASIX) 20 MG tablet take 1 tablet (20 mg) by oral route once daily 90 tablet 1   hydrOXYzine (ATARAX/VISTARIL) 25 MG tablet TAKE 1-2 TABLETS BY MOUTH DAILY AS NEEDED FOR ITCHING 60 tablet 2   labetalol (NORMODYNE) 300 MG tablet Take 300 mg by mouth 2 (two) times daily.      Leuprolide Acetate, 3 Month, (LUPRON DEPOT, 57-MONTH, IM) Inject into the muscle.     LISINOPRIL PO Take 20 mg by mouth in the morning and at bedtime.     PARoxetine (PAXIL) 40 MG tablet TAKE ONE TABLET BY MOUTH EVERY DAY 90 tablet 2   pravastatin (PRAVACHOL) 10 MG tablet Take 1 tablet (10 mg total) by mouth at bedtime. 90 tablet 0   No current facility-administered medications on file prior to visit.   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   Past Surgical History:  Procedure Laterality Date   CARDIAC CATHETERIZATION     CATARACT EXTRACTION W/ INTRAOCULAR LENS  IMPLANT, BILATERAL Bilateral    COLON SURGERY  2014   COLOSTOMY REVERSAL  2014   HERNIA REPAIR     x2   JOINT REPLACEMENT  PROSTATE SURGERY  2006   SEPTOPLASTY     TOTAL KNEE ARTHROPLASTY Left 09/28/2016   Procedure: TOTAL KNEE ARTHROPLASTY;  Surgeon: [name], MD;  Location: [facility];  Service: Orthopedics;  Laterality: Left;    Family History  Problem Relation Age of Onset   Stroke Mother    Parkinson's disease Mother    Lung disease Father    Stroke Father    Alcoholism Father    Leukemia Sister    Muscular dystrophy Son        Becker's   Alzheimer's disease Neg Hx    Social History   Socioeconomic History   Marital status: Married    Spouse name: Not on file   Number of children: 2   Years of education: Not on file   Highest education level: High school graduate  Occupational History   Occupation: Engineer, petroleum    Comment: ICDs/Wound Care  Tobacco Use    Smoking status: Former Smoker    Years: 20.00    Types: Cigarettes    Quit date: 1979    Years since quitting: 42.6   Smokeless tobacco: Never Used   Tobacco comment: quit 37 years ago  Media planner   Vaping Use: Never used  Substance and Sexual Activity   Alcohol use: No   Drug use: No   Sexual activity: Not on file  Other Topics Concern   Not on file  Social History Narrative   LIves at home with his wife   Self employed   Right handed   Drinks about 2 cups of caffeine daily   Social Determinants of Health   Financial Resource Strain:    Difficulty of Paying Living Expenses: Not on file  Food Insecurity: No Food Insecurity   Worried About Charity fundraiser in the Last Year: Never true   [place] in the Last Year: Never true  Transportation Needs:    Lack of Transportation (Medical): Not on file   Lack of Transportation (Non-Medical): Not on file  Physical Activity:    Days of Exercise per Week: Not on file   Minutes of Exercise per Session: Not on file  Stress:    Feeling of Stress : Not on file  Social Connections:    Frequency of Communication with Friends and Family: Not on file   Frequency of Social Gatherings with Friends and Family: Not on file   Attends Religious Services: Not on file   Active Member of Clubs or Organizations: Not on file   Attends Archivist Meetings: Not on file   Marital Status: Not on file    Review of Systems  Constitutional: Negative for chills, fatigue (sometimes) and fever.  HENT: Negative for congestion, ear pain and sore throat.   Respiratory: Negative for cough and shortness of breath.   Cardiovascular: Negative for chest pain and palpitations.  Gastrointestinal: Negative for abdominal pain, anal bleeding, blood in stool, constipation, diarrhea and rectal pain.  Genitourinary: Positive for frequency. Negative for dysuria.  Skin: Positive for rash (abdominal and back area).  Neurological:  Negative for dizziness and headaches.  Psychiatric/Behavioral: Negative for dysphoric mood. The patient is nervous/anxious.      Objective:  BP 130/68    Pulse 80    Temp (!) 97.4 F (36.3 C)    Ht 5\' 10"  (1.778 m)    Wt 186 lb (84.4 kg)    BMI 26.69 kg/m   BP/Weight 08/02/2020 07/29/2020 0/63/0160  Systolic BP 109  840 375  Diastolic BP 68 88 68  Wt. (Lbs) 186 193 189.8  BMI 26.69 27.69 27.23    Physical Exam Vitals reviewed.  Constitutional:      Appearance: He is normal weight.  Neck:     Vascular: No carotid bruit.  Cardiovascular:     Rate and Rhythm: Normal rate and regular rhythm.  Pulmonary:     Effort: Pulmonary effort is normal. No respiratory distress.     Breath sounds: Normal breath sounds.  Abdominal:     General: Bowel sounds are normal.     Palpations: Abdomen is soft.     Tenderness: There is no abdominal tenderness.  Skin:    Findings: Rash (Diffuse welts on torso front and back as well as upper arms BL.) present.  Neurological:     Mental Status: He is alert and oriented to person, place, and time.  Psychiatric:        Mood and Affect: Mood normal.        Behavior: Behavior normal.     Lab Results  Component Value Date   WBC 8.2 08/02/2020   HGB 14.9 08/02/2020   HCT 44.3 08/02/2020   PLT 298 08/02/2020   GLUCOSE 83 08/02/2020   CHOL 173 08/02/2020   TRIG 78 08/02/2020   HDL 61 08/02/2020   LDLCALC 97 08/02/2020   ALT 16 08/02/2020   AST 25 08/02/2020   NA 139 08/02/2020   K 4.3 08/02/2020   CL 100 08/02/2020   CREATININE 1.23 08/02/2020   BUN 29 (H) 08/02/2020   CO2 23 08/02/2020   INR 1.03 01/09/2019   HGBA1C 5.6 12/01/2017      Assessment & Plan:   1. Prostate cancer [facility]) Continue treatment with Dr. Bobby Rumpf. I discussed severe rash with him and as his treatments are hormonal, rather than chemo.  2. Hypertensive heart disease with congestive heart failure, unspecified heart failure type (HCC) Bp is quite low today. Patients bp  log shows Bps are higher. Check BP twice a day (no more) Patient instructed to not make changes in his medicines with out discussing it with me (or his cardiologist.)  3. GAD  Not at goal.  Continue current medications. In addition increase buspirone to 30 mg one twice a day.  4. Dyslipidemia Low fat diet.  5. Pulmonary embolism and infarction ([place]) Continue eliquis.  6. Rash in adult Recommend zyrtec 10 mg once at night.  Recommend start on pepcid 40 mg once daily. Start on prednisone 50 mg once daily for 5 days. - triamcinolone acetonide (KENALOG-40) injection 80 mg  Follow-up: Return in about 3 months (around 11/02/2020) for fasting. PRN if rash is not improving.[name]  An After Visit Summary was printed and given to the patient.  Rochel Brome Analy [facility] [phone]

## 2020-08-02 NOTE — Patient Instructions (Addendum)
Increase buspirone 20 mg one twice a day.  Kenalog shot 80 mg given today Prednisone 50 mg once daily for 5 days Start on pepcid 40 mg once daily.  Start on ceterizine (zyrtec) 10 mg once at night.  If rash has not significantly improved or resolved, pt should call Dr. Michele Mcalpine in 1- 2 weeks.

## 2020-08-03 LAB — CBC WITH DIFFERENTIAL/PLATELET
Basophils Absolute: 0 10*3/uL (ref 0.0–0.2)
Basos: 0 %
EOS (ABSOLUTE): 0.8 10*3/uL — ABNORMAL HIGH (ref 0.0–0.4)
Eos: 10 %
Hematocrit: 44.3 % (ref 37.5–51.0)
Hemoglobin: 14.9 g/dL (ref 13.0–17.7)
Immature Grans (Abs): 0 10*3/uL (ref 0.0–0.1)
Immature Granulocytes: 1 %
Lymphocytes Absolute: 1.5 10*3/uL (ref 0.7–3.1)
Lymphs: 18 %
MCH: 30.2 pg (ref 26.6–33.0)
MCHC: 33.6 g/dL (ref 31.5–35.7)
MCV: 90 fL (ref 79–97)
Monocytes Absolute: 0.8 10*3/uL (ref 0.1–0.9)
Monocytes: 10 %
Neutrophils Absolute: 5.1 10*3/uL (ref 1.4–7.0)
Neutrophils: 61 %
Platelets: 298 10*3/uL (ref 150–450)
RBC: 4.94 x10E6/uL (ref 4.14–5.80)
RDW: 13.8 % (ref 11.6–15.4)
WBC: 8.2 10*3/uL (ref 3.4–10.8)

## 2020-08-03 LAB — COMPREHENSIVE METABOLIC PANEL
ALT: 16 IU/L (ref 0–44)
AST: 25 IU/L (ref 0–40)
Albumin/Globulin Ratio: 1.8 (ref 1.2–2.2)
Albumin: 4.6 g/dL (ref 3.7–4.7)
Alkaline Phosphatase: 90 IU/L (ref 48–121)
BUN/Creatinine Ratio: 24 (ref 10–24)
BUN: 29 mg/dL — ABNORMAL HIGH (ref 8–27)
Bilirubin Total: 0.7 mg/dL (ref 0.0–1.2)
CO2: 23 mmol/L (ref 20–29)
Calcium: 10 mg/dL (ref 8.6–10.2)
Chloride: 100 mmol/L (ref 96–106)
Creatinine, Ser: 1.23 mg/dL (ref 0.76–1.27)
GFR calc Af Amer: 64 mL/min/{1.73_m2} (ref >59)
GFR calc non Af Amer: 55 mL/min/{1.73_m2} — ABNORMAL LOW (ref >59)
Globulin, Total: 2.5 g/dL (ref 1.5–4.5)
Glucose: 83 mg/dL (ref 65–99)
Potassium: 4.3 mmol/L (ref 3.5–5.2)
Sodium: 139 mmol/L (ref 134–144)
Total Protein: 7.1 g/dL (ref 6.0–8.5)

## 2020-08-03 LAB — CARDIOVASCULAR RISK ASSESSMENT

## 2020-08-03 LAB — LIPID PANEL
Chol/HDL Ratio: 2.8 ratio (ref 0.0–5.0)
Cholesterol, Total: 173 mg/dL (ref 100–199)
HDL: 61 mg/dL (ref >39)
LDL Chol Calc (NIH): 97 mg/dL (ref 0–99)
Triglycerides: 78 mg/dL (ref 0–149)
VLDL Cholesterol Cal: 15 mg/dL (ref 5–40)

## 2020-08-03 LAB — PSA: Prostate Specific Ag, Serum: 0.6 ng/mL (ref 0.0–4.0)

## 2020-08-04 ENCOUNTER — Encounter: Payer: Self-pay | Admitting: Family Medicine

## 2020-08-12 ENCOUNTER — Telehealth: Payer: Self-pay | Admitting: Adult Health

## 2020-08-12 NOTE — Telephone Encounter (Signed)
Pt called stating that the prescription for the head gear for his Bipap needs to be called in to [facility] in [place] to [phone]

## 2020-08-12 NOTE — Telephone Encounter (Signed)
I faxed to [facility] [phone] for new headgear for pt / mask for bipap.

## 2020-08-12 NOTE — Telephone Encounter (Signed)
I called pt and relayed that order faxed to [facility] as requested.

## 2020-08-12 NOTE — Telephone Encounter (Signed)
received fax confirmation [phone] .

## 2020-08-13 ENCOUNTER — Other Ambulatory Visit: Payer: Self-pay | Admitting: Family Medicine

## 2020-08-20 ENCOUNTER — Ambulatory Visit (INDEPENDENT_AMBULATORY_CARE_PROVIDER_SITE_OTHER): Payer: Medicare Other | Admitting: Cardiology

## 2020-08-20 ENCOUNTER — Other Ambulatory Visit: Payer: Self-pay

## 2020-08-20 ENCOUNTER — Encounter: Payer: Self-pay | Admitting: Cardiology

## 2020-08-20 VITALS — BP 130/70 | HR 74 | Ht 70.0 in | Wt 191.0 lb

## 2020-08-20 DIAGNOSIS — I951 Orthostatic hypotension: Secondary | ICD-10-CM

## 2020-08-20 DIAGNOSIS — E785 Hyperlipidemia, unspecified: Secondary | ICD-10-CM | POA: Diagnosis not present

## 2020-08-20 DIAGNOSIS — Z86711 Personal history of pulmonary embolism: Secondary | ICD-10-CM | POA: Diagnosis not present

## 2020-08-20 DIAGNOSIS — R0600 Dyspnea, unspecified: Secondary | ICD-10-CM

## 2020-08-20 DIAGNOSIS — R0609 Other forms of dyspnea: Secondary | ICD-10-CM

## 2020-08-20 DIAGNOSIS — C61 Malignant neoplasm of prostate: Secondary | ICD-10-CM

## 2020-08-20 NOTE — Progress Notes (Signed)
Cardiology Office Note:    Date:  08/20/2020   ID:  Marcus King, DOB Mar 15, 1941, MRN [id number]  PCP:  Rochel Brome, MD  Cardiologist:  Jenne Campus, MD    Referring MD: Rochel Brome, MD   No chief complaint on file. I am doing fine  History of Present Illness:    Marcus King is a 79 y.o. male with past medical history significant for orthostatic hypotension, essential hypertension, obstructive sleep apnea, in March he ended going to the hospital because swelling of lower extremities as well as shortness of breath.  He was identified to have DVT as well as submassive PE both sides however, telemetry he was stable no thrombolytic therapy has been given.  After that he was found to have high PSA and diagnosis of prostate cancer has been established.  My understanding is that plan is to initiate chemotherapy.  No talks about surgery.  He is overall doing much better.  He is feeling better in terms of shortness of breath.  Still gets some swelling of lower extremities but very minimal, does not use oxygen during the day he does have pulse oximetry and check his pulse oximetry quite often and numbers are typically more than 95%.  Denies having any recent episode of syncope.  Comes today 2 months of follow-up.  Overall doing well.  He tried to regulate his blood pressure medications.  He does not take labetalol anymore, he also takes lisinopril on as-needed basis and he decide about that medication at evening time.  When he check his blood pressure at 5:00 PM with his the numbers more than 195 systolic then he takes 20 mg of lisinopril.  I told him that he needs to make more smooth dosages of medication and suggested to take lisinopril 10 mg every single day rather than 20 mg every other day also.  Denies have any dizziness or passing out overall doing well.  Does not have any shortness of breath  Past Medical History:  Diagnosis Date  . Anxiety   . Cervical disc disorder with  myelopathy, cervicothoracic region   . Chronic pain of left knee 08/11/2016  . Food poisoning   . GAD (generalized anxiety disorder)   . Headache    sinus  . Hypertension   . Mixed hyperlipidemia   . Orthostatic hypotension   . OSA on CPAP   . Prostate cancer ([place])   . Pulmonary embolism ([place]) 04/2020  . 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 CARDIAC CATHETERIZATION    . CATARACT EXTRACTION W/ INTRAOCULAR LENS  IMPLANT, BILATERAL Bilateral   . COLON SURGERY  2014  . COLOSTOMY REVERSAL  2014  . HERNIA REPAIR     x2  . JOINT REPLACEMENT    . PROSTATE SURGERY  2006  . SEPTOPLASTY    . TOTAL KNEE ARTHROPLASTY Left 09/28/2016   Procedure: TOTAL KNEE ARTHROPLASTY;  Surgeon: [name], MD;  Location: [facility];  Service: Orthopedics;  Laterality: Left;    Current Medications: Current Meds  Medication Sig  . amLODipine (NORVASC) 5 MG tablet Take 1 tablet (5 mg total) by mouth daily.  [name] apixaban (ELIQUIS) 5 MG TABS tablet Take 1 tablet (5 mg total) by mouth 2 (two) times daily.  . busPIRone (BUSPAR) 30 MG tablet Take 1 tablet (30 mg total) by mouth 2 (two) times daily.  . carvedilol (COREG) 25 MG tablet take 1 tablet (  25 mg) by oral route 2 times per day with food  . darolutamide (NUBEQA) 300 MG tablet Take 300 mg by mouth 2 (two) times daily with a meal.   . hydrOXYzine (ATARAX/VISTARIL) 25 MG tablet TAKE 1-2 TABLETS BY MOUTH DAILY AS NEEDED FOR ITCHING (Patient taking differently: Take 25 mg by mouth in the morning and at bedtime. )  . Leuprolide Acetate, 3 Month, (LUPRON DEPOT, 12-MONTH, IM) Inject into the muscle.  . lisinopril (ZESTRIL) 20 MG tablet Take 1 tablet (20 mg total) by mouth 2 (two) times daily. (Patient taking differently: Take 20 mg by mouth daily. )  . PARoxetine (PAXIL) 40 MG tablet TAKE ONE TABLET BY MOUTH EVERY DAY  . pravastatin (PRAVACHOL) 10 MG tablet Take 1 tablet (10 mg total) by  mouth at bedtime.     Allergies:   Beef-derived products, Clonazepam, Monosodium glutamate, and Pegademase bovine   Social History   Socioeconomic History  . Marital status: Married    Spouse name: Not on file  . Number of children: 2  . Years of education: Not on file  . Highest education level: High school graduate  Occupational History  . Occupation: Engineer, petroleum    Comment: ICDs/Wound Care  Tobacco Use  . Smoking status: Former Smoker    Years: 20.00    Types: Cigarettes    Quit date: 1979    Years since quitting: 42.7  . Smokeless tobacco: Never Used  . Tobacco comment: quit 37 years ago  Vaping Use  . Vaping Use: Never used  Substance and Sexual Activity  . Alcohol use: No  . Drug use: No  . Sexual activity: Not on file  Other Topics Concern  . Not on file  Social History Narrative   LIves at home with his wife   Self employed   Right handed   Drinks about 2 cups of caffeine daily   Social Determinants of Health   Financial Resource Strain:   . Difficulty of Paying Living Expenses: Not on file  Food Insecurity: No Food Insecurity  . Worried About Charity fundraiser in the Last Year: Never true  . Ran Out of Food in the Last Year: Never true  Transportation Needs:   . Lack of Transportation (Medical): Not on file  . Lack of Transportation (Non-Medical): Not on file  Physical Activity:   . Days of Exercise per Week: Not on file  . Minutes of Exercise per Session: Not on file  Stress:   . Feeling of Stress : Not on file  Social Connections:   . Frequency of Communication with Friends and Family: Not on file  . Frequency of Social Gatherings with Friends and Family: Not on file  . Attends Religious Services: Not on file  . Active Member of Clubs or Organizations: Not on file  . Attends Archivist Meetings: Not on file  . Marital Status: Not on file     Family History: The patient's family history includes Alcoholism in his father;  Leukemia in his sister; Lung disease in his father; Muscular dystrophy in his son; Parkinson's disease in his mother; Stroke in his father and mother. There is no history of Alzheimer's disease. ROS:   Please see the history of present illness.    All 14 point review of systems negative except as described per history of present illness  EKGs/Labs/Other Studies Reviewed:      Recent Labs: 08/02/2020: ALT 16; BUN 29; Creatinine, Ser 1.23; Hemoglobin 14.9; Platelets  298; Potassium 4.3; Sodium 139  Recent Lipid Panel    Component Value Date/Time   CHOL 173 08/02/2020 1102   TRIG 78 08/02/2020 1102   HDL 61 08/02/2020 1102   CHOLHDL 2.8 08/02/2020 1102   LDLCALC 97 08/02/2020 1102    Physical Exam:    VS:  BP 130/70   Pulse 74   Ht 5\' 10"  (1.778 m)   Wt 191 lb (86.6 kg)   SpO2 99%   BMI 27.41 kg/m     Wt Readings from Last 3 Encounters:  08/20/20 191 lb (86.6 kg)  08/02/20 186 lb (84.4 kg)  07/29/20 193 lb (87.5 kg)     GEN:  Well nourished, well developed in no acute distress HEENT: Normal NECK: No JVD; No carotid bruits LYMPHATICS: No lymphadenopathy CARDIAC: RRR, no murmurs, no rubs, no gallops RESPIRATORY:  Clear to auscultation without rales, wheezing or rhonchi  ABDOMEN: Soft, non-tender, non-distended MUSCULOSKELETAL:  No edema; No deformity  SKIN: Warm and dry LOWER EXTREMITIES: no swelling NEUROLOGIC:  Alert and oriented x 3 PSYCHIATRIC:  Normal affect   ASSESSMENT:    1. Orthostatic hypotension   2. Prostate cancer ([place])   3. History of pulmonary embolus (PE)   4. Dyspnea on exertion   5. Dyslipidemia    PLAN:    In order of problems listed above:  1. Orthostatic hypotension denies have any problems from that point review.  We will continue present management. 2. Prostate CA: Followed by primary care physician and urologist. 3. History of pulmonary emboli.  Recovered.  No pulmonary hypertension last echocardiogram was good. 4. Dyspnea exertion:  Denies having any. 5. Dyslipidemia: On statin which I will continue.   Medication Adjustments/Labs and Tests Ordered: Current medicines are reviewed at length with the patient today.  Concerns regarding medicines are outlined above.  No orders of the defined types were placed in this encounter.  Medication changes: No orders of the defined types were placed in this encounter.   Signed, Park Liter, MD, [facility] 08/20/2020 2:44 PM    [place]

## 2020-08-20 NOTE — Patient Instructions (Signed)
Medication Instructions:  Your physician recommends that you continue on your current medications as directed. Please refer to the Current Medication list given to you today.  *If you need a refill on your cardiac medications before your next appointment, please call your pharmacy*   Lab Work: none If you have labs (blood work) drawn today and your tests are completely normal, you will receive your results only by: [name] MyChart Message (if you have MyChart) OR . A paper copy in the mail If you have any lab test that is abnormal or we need to change your treatment, we will call you to review the results.   Testing/Procedures: none   Follow-Up: At [facility], you and your health needs are our priority.  As part of our continuing mission to provide you with exceptional heart care, we have created designated Provider Care Teams.  These Care Teams include your primary Cardiologist (physician) and Advanced Practice Providers (APPs -  Physician Assistants and Nurse Practitioners) who all work together to provide you with the care you need, when you need it.  We recommend signing up for the patient portal called "MyChart".  Sign up information is provided on this After Visit Summary.  MyChart is used to connect with patients for Virtual Visits (Telemedicine).  Patients are able to view lab/test results, encounter notes, upcoming appointments, etc.  Non-urgent messages can be sent to your provider as well.   To learn more about what you can do with MyChart, go to [email or web address].    Your next appointment:   3 month(s)  The format for your next appointment:   In Person  Provider:   Jenne Campus, MD   Other Instructions

## 2020-08-26 DIAGNOSIS — C774 Secondary and unspecified malignant neoplasm of inguinal and lower limb lymph nodes: Secondary | ICD-10-CM | POA: Diagnosis not present

## 2020-08-26 DIAGNOSIS — C61 Malignant neoplasm of prostate: Secondary | ICD-10-CM | POA: Diagnosis not present

## 2020-08-27 DIAGNOSIS — C61 Malignant neoplasm of prostate: Secondary | ICD-10-CM | POA: Diagnosis not present

## 2020-08-27 DIAGNOSIS — C774 Secondary and unspecified malignant neoplasm of inguinal and lower limb lymph nodes: Secondary | ICD-10-CM | POA: Diagnosis not present

## 2020-08-30 ENCOUNTER — Encounter: Payer: Self-pay | Admitting: Oncology

## 2020-09-09 ENCOUNTER — Encounter: Payer: Self-pay | Admitting: Pharmacist

## 2020-09-09 DIAGNOSIS — C61 Malignant neoplasm of prostate: Secondary | ICD-10-CM

## 2020-09-09 HISTORY — DX: Malignant neoplasm of prostate: C61

## 2020-09-19 ENCOUNTER — Other Ambulatory Visit: Payer: Self-pay | Admitting: Hematology and Oncology

## 2020-09-19 DIAGNOSIS — C774 Secondary and unspecified malignant neoplasm of inguinal and lower limb lymph nodes: Secondary | ICD-10-CM | POA: Diagnosis not present

## 2020-09-19 DIAGNOSIS — C61 Malignant neoplasm of prostate: Secondary | ICD-10-CM | POA: Diagnosis not present

## 2020-09-19 LAB — CBC AND DIFFERENTIAL
HCT: 41 (ref 41–53)
Hemoglobin: 13.8 (ref 13.5–17.5)
Platelets: 275 (ref 150–399)
WBC: 7.7

## 2020-09-19 LAB — BASIC METABOLIC PANEL
BUN: 24 — AB (ref 4–21)
CO2: 24 — AB (ref 13–22)
Chloride: 103 (ref 99–108)
Creatinine: 1.1 (ref 0.6–1.3)
Glucose: 122
Potassium: 4 (ref 3.4–5.3)
Sodium: 138 (ref 137–147)

## 2020-09-19 LAB — CBC: RBC: 4.56 (ref 3.87–5.11)

## 2020-09-19 LAB — COMPREHENSIVE METABOLIC PANEL: Calcium: 9.2 (ref 8.7–10.7)

## 2020-09-20 ENCOUNTER — Other Ambulatory Visit: Payer: Self-pay

## 2020-09-20 ENCOUNTER — Inpatient Hospital Stay: Payer: Medicare Other | Attending: Oncology

## 2020-09-20 ENCOUNTER — Other Ambulatory Visit: Payer: Self-pay | Admitting: Family Medicine

## 2020-09-20 VITALS — BP 170/70 | HR 61 | Temp 98.7°F | Resp 18 | Ht 70.0 in | Wt 194.4 lb

## 2020-09-20 DIAGNOSIS — Z5111 Encounter for antineoplastic chemotherapy: Secondary | ICD-10-CM | POA: Insufficient documentation

## 2020-09-20 DIAGNOSIS — C61 Malignant neoplasm of prostate: Secondary | ICD-10-CM | POA: Insufficient documentation

## 2020-09-20 DIAGNOSIS — C774 Secondary and unspecified malignant neoplasm of inguinal and lower limb lymph nodes: Secondary | ICD-10-CM | POA: Diagnosis not present

## 2020-09-20 MED ORDER — LEUPROLIDE ACETATE (3 MONTH) 22.5 MG ~~LOC~~ KIT
PACK | SUBCUTANEOUS | Status: AC
  Filled 2020-09-20: qty 22.5

## 2020-09-20 MED ORDER — LEUPROLIDE ACETATE (3 MONTH) 22.5 MG IM KIT
PACK | INTRAMUSCULAR | Status: AC
  Filled 2020-09-20: qty 22.5

## 2020-09-20 MED ORDER — LEUPROLIDE ACETATE (3 MONTH) 22.5 MG IM KIT
22.5000 mg | PACK | Freq: Once | INTRAMUSCULAR | Status: AC
  Administered 2020-09-20: 22.5 mg via INTRAMUSCULAR

## 2020-09-20 NOTE — Patient Instructions (Signed)
Leuprolide injection What is this medicine? LEUPROLIDE (loo PROE lide) is a man-made hormone. It is used to treat the symptoms of prostate cancer. This medicine may also be used to treat children with early onset of puberty. It may be used for other hormonal conditions. This medicine may be used for other purposes; ask your health care provider or pharmacist if you have questions. COMMON BRAND NAME(S): Lupron What should I tell my health care provider before I take this medicine? They need to know if you have any of these conditions:  diabetes  heart disease or previous heart attack  high blood pressure  high cholesterol  pain or difficulty passing urine  spinal cord metastasis  stroke  tobacco smoker  an unusual or allergic reaction to leuprolide, benzyl alcohol, other medicines, foods, dyes, or preservatives  pregnant or trying to get pregnant  breast-feeding How should I use this medicine? This medicine is for injection under the skin or into a muscle. You will be taught how to prepare and give this medicine. Use exactly as directed. Take your medicine at regular intervals. Do not take your medicine more often than directed. It is important that you put your used needles and syringes in a special sharps container. Do not put them in a trash can. If you do not have a sharps container, call your pharmacist or healthcare provider to get one. A special MedGuide will be given to you by the pharmacist with each prescription and refill. Be sure to read this information carefully each time. Talk to your pediatrician regarding the use of this medicine in children. While this medicine may be prescribed for children as young as 8 years for selected conditions, precautions do apply. Overdosage: If you think you have taken too much of this medicine contact a poison control center or emergency room at once. NOTE: This medicine is only for you. Do not share this medicine with others. What if  I miss a dose? If you miss a dose, take it as soon as you can. If it is almost time for your next dose, take only that dose. Do not take double or extra doses. What may interact with this medicine? Do not take this medicine with any of the following medications:  chasteberry This medicine may also interact with the following medications:  herbal or dietary supplements, like black cohosh or DHEA  male hormones, like estrogens or progestins and birth control pills, patches, rings, or injections  male hormones, like testosterone This list may not describe all possible interactions. Give your health care provider a list of all the medicines, herbs, non-prescription drugs, or dietary supplements you use. Also tell them if you smoke, drink alcohol, or use illegal drugs. Some items may interact with your medicine. What should I watch for while using this medicine? Visit your doctor or health care professional for regular checks on your progress. During the first week, your symptoms may get worse, but then will improve as you continue your treatment. You may get hot flashes, increased bone pain, increased difficulty passing urine, or an aggravation of nerve symptoms. Discuss these effects with your doctor or health care professional, some of them may improve with continued use of this medicine. Male patients may experience a menstrual cycle or spotting during the first 2 months of therapy with this medicine. If this continues, contact your doctor or health care professional. This medicine may increase blood sugar. Ask your healthcare provider if changes in diet or medicines are needed if   you have diabetes. What side effects may I notice from receiving this medicine? Side effects that you should report to your doctor or health care professional as soon as possible:  allergic reactions like skin rash, itching or hives, swelling of the face, lips, or tongue  breathing problems  chest  pain  depression or memory disorders  pain in your legs or groin  pain at site where injected  severe headache  signs and symptoms of high blood sugar such as being more thirsty or hungry or having to urinate more than normal. You may also feel very tired or have blurry vision  swelling of the feet and legs  visual changes  vomiting Side effects that usually do not require medical attention (report to your doctor or health care professional if they continue or are bothersome):  breast swelling or tenderness  decrease in sex drive or performance  diarrhea  hot flashes  loss of appetite  muscle, joint, or bone pains  nausea  redness or irritation at site where injected  skin problems or acne This list may not describe all possible side effects. Call your doctor for medical advice about side effects. You may report side effects to FDA at 1-800-FDA-1088. Where should I keep my medicine? Keep out of the reach of children. Store below 25 degrees C (77 degrees F). Do not freeze. Protect from light. Do not use if it is not clear or if there are particles present. Throw away any unused medicine after the expiration date. NOTE: This sheet is a summary. It may not cover all possible information. If you have questions about this medicine, talk to your doctor, pharmacist, or health care provider.  2020 Elsevier/Gold Standard (2018-09-29 09:52:48)  

## 2020-09-20 NOTE — Progress Notes (Signed)
Patient is stable at discharge

## 2020-09-25 ENCOUNTER — Other Ambulatory Visit: Payer: Self-pay

## 2020-09-25 ENCOUNTER — Encounter: Payer: Self-pay | Admitting: Family Medicine

## 2020-09-25 ENCOUNTER — Ambulatory Visit (INDEPENDENT_AMBULATORY_CARE_PROVIDER_SITE_OTHER): Payer: Medicare Other

## 2020-09-25 DIAGNOSIS — Z23 Encounter for immunization: Secondary | ICD-10-CM

## 2020-09-30 ENCOUNTER — Other Ambulatory Visit: Payer: Self-pay | Admitting: Family Medicine

## 2020-10-10 DIAGNOSIS — L300 Nummular dermatitis: Secondary | ICD-10-CM | POA: Diagnosis not present

## 2020-10-10 DIAGNOSIS — L501 Idiopathic urticaria: Secondary | ICD-10-CM | POA: Diagnosis not present

## 2020-10-10 DIAGNOSIS — D485 Neoplasm of uncertain behavior of skin: Secondary | ICD-10-CM | POA: Diagnosis not present

## 2020-10-14 ENCOUNTER — Other Ambulatory Visit: Payer: Self-pay | Admitting: Family Medicine

## 2020-10-14 ENCOUNTER — Telehealth: Payer: Self-pay | Admitting: Cardiology

## 2020-10-14 ENCOUNTER — Telehealth: Payer: Self-pay

## 2020-10-14 NOTE — Telephone Encounter (Signed)
Patient called requesting a refill on Labetalol per Dr. Tobie Poet patient needs to discuss this medication refill with Dr. Bettina Gavia as he is who d/c the medication. Patient aware and is going to contact his Cardiologist.

## 2020-10-14 NOTE — Telephone Encounter (Signed)
*  STAT* If patient is at the pharmacy, call can be transferred to refill team.   1. Which medications need to be refilled? (please list name of each medication and dose if known) Labetalol 300 MG tablet  2. Which pharmacy/location (including street and city if local pharmacy) is medication to be sent to? [place], [place]  3. Do they need a 30 day or 90 day supply? 90 day supply

## 2020-10-14 NOTE — Telephone Encounter (Signed)
Called patient. He reports his pcp fills labetalol that is why it is not on our list. He couldn't get her on the phone so he wants Dr.Krasowski to fill for him until he sees her next week. I advised him we can not do that unless we are going to fill continuously from now on. He is going to call pcp office back since he didn't want to switch rx to Dr. Agustin Cree. He will let us know if he needs anything further.

## 2020-10-14 NOTE — Telephone Encounter (Signed)
Left message for patient to return call.

## 2020-10-17 ENCOUNTER — Telehealth: Payer: Self-pay

## 2020-10-17 NOTE — Telephone Encounter (Signed)
Called and spoke with patient regarding reapplying for Nubeqa, his current enrollment will end 12/13/2020. He will be in tomorrow 10/18/2020@10 :00am to complete needed paperwork.

## 2020-10-18 NOTE — Progress Notes (Signed)
Met with patient today and filled out application for free Nebeqa through [employer]. I will fax in application.

## 2020-10-21 ENCOUNTER — Other Ambulatory Visit: Payer: Self-pay | Admitting: Family Medicine

## 2020-10-21 DIAGNOSIS — F419 Anxiety disorder, unspecified: Secondary | ICD-10-CM

## 2020-10-21 MED ORDER — BUSPIRONE HCL 30 MG PO TABS: 30.0000 mg | ORAL_TABLET | Freq: Two times a day (BID) | ORAL | 0 refills | Status: DC

## 2020-10-23 ENCOUNTER — Ambulatory Visit (INDEPENDENT_AMBULATORY_CARE_PROVIDER_SITE_OTHER): Payer: Medicare Other | Admitting: Family Medicine

## 2020-10-23 ENCOUNTER — Encounter: Payer: Self-pay | Admitting: Family Medicine

## 2020-10-23 ENCOUNTER — Other Ambulatory Visit: Payer: Self-pay

## 2020-10-23 VITALS — BP 142/78 | HR 56 | Temp 96.4°F | Ht 70.0 in | Wt 188.0 lb

## 2020-10-23 DIAGNOSIS — L509 Urticaria, unspecified: Secondary | ICD-10-CM

## 2020-10-23 DIAGNOSIS — F411 Generalized anxiety disorder: Secondary | ICD-10-CM | POA: Diagnosis not present

## 2020-10-23 DIAGNOSIS — E782 Mixed hyperlipidemia: Secondary | ICD-10-CM

## 2020-10-23 DIAGNOSIS — R001 Bradycardia, unspecified: Secondary | ICD-10-CM

## 2020-10-23 DIAGNOSIS — C61 Malignant neoplasm of prostate: Secondary | ICD-10-CM | POA: Diagnosis not present

## 2020-10-23 DIAGNOSIS — I110 Hypertensive heart disease with heart failure: Secondary | ICD-10-CM

## 2020-10-23 DIAGNOSIS — Z86711 Personal history of pulmonary embolism: Secondary | ICD-10-CM

## 2020-10-23 MED ORDER — AMLODIPINE BESYLATE 5 MG PO TABS: 5.0000 mg | ORAL_TABLET | Freq: Every day | ORAL | 1 refills | Status: DC

## 2020-10-23 MED ORDER — LABETALOL HCL 200 MG PO TABS: 400.0000 mg | ORAL_TABLET | Freq: Two times a day (BID) | ORAL | 2 refills | Status: DC

## 2020-10-23 NOTE — Patient Instructions (Signed)
DISCONTINUE CARVEDILOL.  START ON LABETOLOL 200 MG 1 1/2  TWICE A DAY. GIVE BP/PULSE TWICE DAILY. .  CONTINUE AMLODIPINE 5 MG ONCE DAILY.

## 2020-10-23 NOTE — Progress Notes (Signed)
Subjective:  Patient ID: Marcus King, male    DOB: 1941-12-10  Age: 79 y.o. MRN: [id number]  Chief Complaint  Patient presents with  . Hypertension  . Anxiety    HPI   Prostate cancer- last PSA 0.6 (07/22/2020.) Improved with hormone treatment.  Currently on Lupron.  Patient is followed by  Dr. Bobby Rumpf with oncology.   In terms of his pulmonary embolism which occurred in May 2021, his breathing is good. He is no longer on oxygen. He is on eliquis.    Hypertension: His current cardiac medication regimen includes a diuretic ( furosemide 20 mg once daily ), a beta-blocker ( carvedilol 25 mg i po bid ), a calcium channel blocker ( amlodipine 5 mg once daily), and patient says he is also been taking labetalol 300 mg twice a day.  Per cardiology's note he is not supposed to be taking labetalol.  He is not taking lisinopril anymore.  His blood pressures have been elevated 139-170 over 60s to 80s.  His heart rate ranges anywhere from mid 50s to mid 60s.  He is checking his blood pressure approximately 5-6 times per day.  He does not feel particularly symptomatic with his blood pressure up.  The patient reports he has taken carvedilol without the labetalol and his blood pressure goes up.  He has not however taken the labetalol without the carvedilol.  Patient continues to be very anxious. Denies depression. Currently on paxil, hydroxyzine and BuSpar.  He does also take the hydroxyzine for the itchy rash he gets intermittently.  It is very itchy.  Essentially has urticaria seemingly randomly.  Current Outpatient Medications on File Prior to Visit  Medication Sig Dispense Refill  . apixaban (ELIQUIS) 5 MG TABS tablet Take 1 tablet (5 mg total) by mouth 2 (two) times daily. 60 tablet 3  . busPIRone (BUSPAR) 30 MG tablet Take 1 tablet (30 mg total) by mouth 2 (two) times daily. 180 tablet 0  . darolutamide (NUBEQA) 300 MG tablet Take 600 mg by mouth daily.    . hydrOXYzine (ATARAX/VISTARIL) 25 MG  tablet TAKE 1-2 TABLETS BY MOUTH DAILY AS NEEDED FOR ITCHING (Patient taking differently: Take 25 mg by mouth in the morning and at bedtime. ) 60 tablet 2  . Leuprolide Acetate, 3 Month, (LUPRON DEPOT, 40-MONTH, IM) Inject into the muscle.    [name] PARoxetine (PAXIL) 40 MG tablet TAKE ONE TABLET BY MOUTH EVERY DAY 90 tablet 2  . pravastatin (PRAVACHOL) 10 MG tablet TAKE 1 TABLET BY MOUTH AT BEDTIME 90 tablet 1   No current facility-administered medications on file prior to visit.   Past Medical History:  Diagnosis Date  . Anxiety   . Cervical disc disorder with myelopathy, cervicothoracic region   . Chronic pain of left knee 08/11/2016  . Food poisoning   . GAD (generalized anxiety disorder)   . Headache    sinus  . Hypertension   . Mixed hyperlipidemia   . Orthostatic hypotension   . OSA on CPAP   . Prostate cancer ([place])   . Pulmonary embolism ([place]) 04/2020  . 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 CARDIAC CATHETERIZATION    . CATARACT EXTRACTION W/ INTRAOCULAR LENS  IMPLANT, BILATERAL Bilateral   . COLON SURGERY  2014  . COLOSTOMY REVERSAL  2014  . HERNIA REPAIR     x2  . JOINT REPLACEMENT    . PROSTATE SURGERY  2006  . SEPTOPLASTY    . TOTAL KNEE ARTHROPLASTY Left 09/28/2016   Procedure: TOTAL KNEE ARTHROPLASTY;  Surgeon: [name], MD;  Location: [facility];  Service: Orthopedics;  Laterality: Left;    Family History  Problem Relation Age of Onset  . Stroke Mother   . Parkinson's disease Mother   . Lung disease Father   . Stroke Father   . Alcoholism Father   . Leukemia Sister   . Muscular dystrophy Son        Immunologist  . Alzheimer's disease Neg Hx    Social History   Socioeconomic History  . Marital status: Married    Spouse name: Not on file  . Number of children: 2  . Years of education: Not on file  . Highest education level: High school graduate  Occupational History  .  Occupation: Engineer, petroleum    Comment: ICDs/Wound Care  Tobacco Use  . Smoking status: Former Smoker    Years: 20.00    Types: Cigarettes    Quit date: 1979    Years since quitting: 42.8  . Smokeless tobacco: Never Used  . Tobacco comment: quit 37 years ago  Vaping Use  . Vaping Use: Never used  Substance and Sexual Activity  . Alcohol use: No  . Drug use: No  . Sexual activity: Not on file  Other Topics Concern  . Not on file  Social History Narrative   LIves at home with his wife   Self employed   Right handed   Drinks about 2 cups of caffeine daily   Social Determinants of Health   Financial Resource Strain:   . Difficulty of Paying Living Expenses: Not on file  Food Insecurity: No Food Insecurity  . Worried About Charity fundraiser in the Last Year: Never true  . Ran Out of Food in the Last Year: Never true  Transportation Needs:   . Lack of Transportation (Medical): Not on file  . Lack of Transportation (Non-Medical): Not on file  Physical Activity:   . Days of Exercise per Week: Not on file  . Minutes of Exercise per Session: Not on file  Stress:   . Feeling of Stress : Not on file  Social Connections:   . Frequency of Communication with Friends and Family: Not on file  . Frequency of Social Gatherings with Friends and Family: Not on file  . Attends Religious Services: Not on file  . Active Member of Clubs or Organizations: Not on file  . Attends Archivist Meetings: Not on file  . Marital Status: Not on file    Review of Systems  Constitutional: Negative for chills, fatigue (sometimes) and fever.  HENT: Negative for congestion, ear pain and sore throat.   Respiratory: Negative for cough and shortness of breath.   Cardiovascular: Negative for chest pain and palpitations.  Gastrointestinal: Negative for abdominal pain, anal bleeding, blood in stool, constipation, diarrhea and rectal pain.  Genitourinary: Positive for frequency. Negative for  dysuria.  Musculoskeletal: Negative for arthralgias, back pain and myalgias.  Skin: Rash: abdominal and back area.  Neurological: Negative for dizziness and headaches.  Psychiatric/Behavioral: Negative for dysphoric mood. The patient is nervous/anxious.      Objective:  BP (!) 142/78   Pulse (!) 56   Temp (!) 96.4 F (35.8 C)   Ht 5\' 10"  (1.778 m)   Wt 188 lb (85.3 kg)   SpO2 95%   BMI 26.98 kg/m   BP/Weight 10/23/2020 09/20/2020  08/17/7653  Systolic BP 650 354 656  Diastolic BP 78 70 70  Wt. (Lbs) 188 194.38 191  BMI 26.98 27.89 27.41    Physical Exam Vitals reviewed.  Constitutional:      Appearance: He is normal weight.  Neck:     Vascular: No carotid bruit.  Cardiovascular:     Rate and Rhythm: Regular rhythm. Bradycardia present.  Pulmonary:     Effort: Pulmonary effort is normal. No respiratory distress.     Breath sounds: Normal breath sounds.  Abdominal:     General: Bowel sounds are normal.     Palpations: Abdomen is soft.     Tenderness: There is no abdominal tenderness.  Skin:    Findings: Rash (URTICARIA COMES AND GOES. ON HYDROXYZINE.) present.  Neurological:     Mental Status: He is alert and oriented to person, place, and time.  Psychiatric:        Mood and Affect: Mood normal.        Behavior: Behavior normal.     Lab Results  Component Value Date   WBC 7.7 09/19/2020   HGB 13.8 09/19/2020   HCT 41 09/19/2020   PLT 275 09/19/2020   GLUCOSE 83 08/02/2020   CHOL 173 08/02/2020   TRIG 78 08/02/2020   HDL 61 08/02/2020   LDLCALC 97 08/02/2020   ALT 16 08/02/2020   AST 25 08/02/2020   NA 138 09/19/2020   K 4.0 09/19/2020   CL 103 09/19/2020   CREATININE 1.1 09/19/2020   BUN 24 (A) 09/19/2020   CO2 24 (A) 09/19/2020   INR 1.03 01/09/2019   HGBA1C 5.6 12/01/2017      Assessment & Plan:  1. Hypertensive heart disease with congestive heart failure, unspecified heart failure type ([place]) Poorly controlled.  Check lab work.  Discontinue  carvedilol.  Continue labetalol total of 300 mg twice a day.  If blood pressure is not improved in 1 to 2 weeks will increase to 400 mg twice a day.  Anticipate having to increase the labetalol but I am concerned about his heart rate.  Patient to check blood pressure and pulse twice daily and follow-up in 2 weeks. - amLODipine (NORVASC) 5 MG tablet; Take 1 tablet (5 mg total) by mouth daily.  Dispense: 90 tablet; Refill: 1 - CBC with Differential/Platelet - Comprehensive metabolic panel - Lipid panel - labetalol (NORMODYNE) 200 MG tablet; Take 2 tablets (400 mg total) by mouth 2 (two) times daily.  Dispense: 120 tablet; Refill: 2  2. Bradycardia Discontinue carvedilol.  This may be why the patient has some fatigue.  3. GAD (generalized anxiety disorder) Continue current medications.  Fairly effective.  4. Urticaria Continue hydroxyzine twice daily.  5. Prostate cancer [facility]) Continue management with Dr. Bobby Rumpf.  6. History of pulmonary embolism Continue Eliquis indefinitely.  His prostate cancer was found in the spring at the same time as he had his pulmonary embolism, as well as he received the Jonette Mate Covid vaccine 2 weeks prior to becoming short of breath and having the pulmonary embolism.  It is unclear what the causes however until his cancer is cleared I feel he should remain on Eliquis.    7. Mixed hyperlipidemia Checking lipid panel.  Continue pravastatin for now. Recommend eating healthy and exercise.  Follow-up: Return in about 2 weeks (around 11/06/2020) for [place] BP..  Time spent: 30 minutes.  I reviewed cardiology records, discussed how hypertension and heart rate are linked, made changes to his medications,  and set him up for follow-up to review his blood pressures and heart rate.  An After Visit Summary was printed and given to the patient.  Rochel Brome [facility] [phone]

## 2020-10-24 LAB — COMPREHENSIVE METABOLIC PANEL
ALT: 18 IU/L (ref 0–44)
AST: 18 IU/L (ref 0–40)
Albumin/Globulin Ratio: 1.8 (ref 1.2–2.2)
Albumin: 4.1 g/dL (ref 3.7–4.7)
Alkaline Phosphatase: 73 IU/L (ref 44–121)
BUN/Creatinine Ratio: 26 — ABNORMAL HIGH (ref 10–24)
BUN: 27 mg/dL (ref 8–27)
Bilirubin Total: 0.8 mg/dL (ref 0.0–1.2)
CO2: 23 mmol/L (ref 20–29)
Calcium: 9.6 mg/dL (ref 8.6–10.2)
Chloride: 103 mmol/L (ref 96–106)
Creatinine, Ser: 1.05 mg/dL (ref 0.76–1.27)
GFR calc Af Amer: 77 mL/min/{1.73_m2} (ref >59)
GFR calc non Af Amer: 67 mL/min/{1.73_m2} (ref >59)
Globulin, Total: 2.3 g/dL (ref 1.5–4.5)
Glucose: 86 mg/dL (ref 65–99)
Potassium: 4.5 mmol/L (ref 3.5–5.2)
Sodium: 141 mmol/L (ref 134–144)
Total Protein: 6.4 g/dL (ref 6.0–8.5)

## 2020-10-24 LAB — CBC WITH DIFFERENTIAL/PLATELET
Basophils Absolute: 0 10*3/uL (ref 0.0–0.2)
Basos: 0 %
EOS (ABSOLUTE): 0.4 10*3/uL (ref 0.0–0.4)
Eos: 3 %
Hematocrit: 40.8 % (ref 37.5–51.0)
Hemoglobin: 14.1 g/dL (ref 13.0–17.7)
Immature Grans (Abs): 0.1 10*3/uL (ref 0.0–0.1)
Immature Granulocytes: 1 %
Lymphocytes Absolute: 1.5 10*3/uL (ref 0.7–3.1)
Lymphs: 14 %
MCH: 31.3 pg (ref 26.6–33.0)
MCHC: 34.6 g/dL (ref 31.5–35.7)
MCV: 91 fL (ref 79–97)
Monocytes Absolute: 0.9 10*3/uL (ref 0.1–0.9)
Monocytes: 8 %
Neutrophils Absolute: 8 10*3/uL — ABNORMAL HIGH (ref 1.4–7.0)
Neutrophils: 74 %
Platelets: 291 10*3/uL (ref 150–450)
RBC: 4.5 x10E6/uL (ref 4.14–5.80)
RDW: 12.9 % (ref 11.6–15.4)
WBC: 10.9 10*3/uL — ABNORMAL HIGH (ref 3.4–10.8)

## 2020-10-24 LAB — LIPID PANEL
Chol/HDL Ratio: 2.8 ratio (ref 0.0–5.0)
Cholesterol, Total: 165 mg/dL (ref 100–199)
HDL: 60 mg/dL (ref >39)
LDL Chol Calc (NIH): 91 mg/dL (ref 0–99)
Triglycerides: 73 mg/dL (ref 0–149)
VLDL Cholesterol Cal: 14 mg/dL (ref 5–40)

## 2020-10-24 LAB — CARDIOVASCULAR RISK ASSESSMENT

## 2020-10-28 ENCOUNTER — Other Ambulatory Visit: Payer: Self-pay | Admitting: Family Medicine

## 2020-11-05 ENCOUNTER — Ambulatory Visit (INDEPENDENT_AMBULATORY_CARE_PROVIDER_SITE_OTHER): Payer: Medicare Other | Admitting: Family Medicine

## 2020-11-05 ENCOUNTER — Other Ambulatory Visit: Payer: Self-pay

## 2020-11-05 ENCOUNTER — Encounter: Payer: Self-pay | Admitting: Family Medicine

## 2020-11-05 ENCOUNTER — Other Ambulatory Visit: Payer: Self-pay | Admitting: Hematology and Oncology

## 2020-11-05 VITALS — BP 118/76 | HR 63 | Temp 97.4°F | Ht 70.0 in | Wt 187.0 lb

## 2020-11-05 DIAGNOSIS — I110 Hypertensive heart disease with heart failure: Secondary | ICD-10-CM | POA: Diagnosis not present

## 2020-11-05 DIAGNOSIS — C61 Malignant neoplasm of prostate: Secondary | ICD-10-CM

## 2020-11-05 DIAGNOSIS — I5032 Chronic diastolic (congestive) heart failure: Secondary | ICD-10-CM

## 2020-11-05 MED ORDER — LABETALOL HCL 300 MG PO TABS: 300.0000 mg | ORAL_TABLET | Freq: Two times a day (BID) | ORAL | 1 refills | Status: DC

## 2020-11-05 NOTE — Progress Notes (Signed)
Subjective:  Patient ID: Marcus King, male    DOB: 02-01-1941  Age: 79 y.o. MRN: [id number]  Chief Complaint  Patient presents with  . Hypertension    HPI Hypertension - Has been taking Labetalol 300 mg BID and amlodipine 5 mg daily. Has questions rather he was taking the correct dosage. At home bp readings range 112-162/59-87.  Hyperlipidemia - continue pravastatin. Eating healthy.   Current Outpatient Medications on File Prior to Visit  Medication Sig Dispense Refill  . amLODipine (NORVASC) 5 MG tablet Take 1 tablet (5 mg total) by mouth daily. 90 tablet 1  . apixaban (ELIQUIS) 5 MG TABS tablet Take 1 tablet (5 mg total) by mouth 2 (two) times daily. 60 tablet 3  . busPIRone (BUSPAR) 30 MG tablet Take 1 tablet (30 mg total) by mouth 2 (two) times daily. 180 tablet 0  . darolutamide (NUBEQA) 300 MG tablet Take 600 mg by mouth daily.    . hydrOXYzine (ATARAX/VISTARIL) 25 MG tablet TAKE 1-2 TABLETS BY MOUTH DAILY AS NEEDED FOR ITCHING 60 tablet 2  . Leuprolide Acetate, 3 Month, (LUPRON DEPOT, 53-MONTH, IM) Inject into the muscle.    [name] PARoxetine (PAXIL) 40 MG tablet TAKE ONE TABLET BY MOUTH EVERY DAY 90 tablet 2  . pravastatin (PRAVACHOL) 10 MG tablet TAKE 1 TABLET BY MOUTH AT BEDTIME 90 tablet 1   No current facility-administered medications on file prior to visit.   Past Medical History:  Diagnosis Date  . Anxiety   . Cervical disc disorder with myelopathy, cervicothoracic region   . Chronic pain of left knee 08/11/2016  . Food poisoning   . GAD (generalized anxiety disorder)   . Headache    sinus  . Hypertension   . Mixed hyperlipidemia   . Orthostatic hypotension   . OSA on CPAP   . Prostate cancer ([place])   . Pulmonary embolism ([place]) 04/2020  . 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 CARDIAC CATHETERIZATION    . CATARACT EXTRACTION W/ INTRAOCULAR LENS  IMPLANT, BILATERAL Bilateral   .  COLON SURGERY  2014  . COLOSTOMY REVERSAL  2014  . HERNIA REPAIR     x2  . JOINT REPLACEMENT    . PROSTATE SURGERY  2006  . SEPTOPLASTY    . TOTAL KNEE ARTHROPLASTY Left 09/28/2016   Procedure: TOTAL KNEE ARTHROPLASTY;  Surgeon: [name], MD;  Location: [facility];  Service: Orthopedics;  Laterality: Left;    Family History  Problem Relation Age of Onset  . Stroke Mother   . Parkinson's disease Mother   . Lung disease Father   . Stroke Father   . Alcoholism Father   . Leukemia Sister   . Muscular dystrophy Son        Immunologist  . Alzheimer's disease Neg Hx    Social History   Socioeconomic History  . Marital status: Married    Spouse name: Not on file  . Number of children: 2  . Years of education: Not on file  . Highest education level: High school graduate  Occupational History  . Occupation: Engineer, petroleum    Comment: ICDs/Wound Care  Tobacco Use  . Smoking status: Former Smoker    Years: 20.00    Types: Cigarettes    Quit date: 1979    Years since quitting: 42.9  . Smokeless tobacco: Never Used  . Tobacco comment: quit 37 years ago  Vaping Use  .  Vaping Use: Never used  Substance and Sexual Activity  . Alcohol use: No  . Drug use: No  . Sexual activity: Not on file  Other Topics Concern  . Not on file  Social History Narrative   LIves at home with his wife   Self employed   Right handed   Drinks about 2 cups of caffeine daily   Social Determinants of Health   Financial Resource Strain:   . Difficulty of Paying Living Expenses: Not on file  Food Insecurity: No Food Insecurity  . Worried About Charity fundraiser in the Last Year: Never true  . Ran Out of Food in the Last Year: Never true  Transportation Needs:   . Lack of Transportation (Medical): Not on file  . Lack of Transportation (Non-Medical): Not on file  Physical Activity:   . Days of Exercise per Week: Not on file  . Minutes of Exercise per Session: Not on file  Stress:   . Feeling of  Stress : Not on file  Social Connections:   . Frequency of Communication with Friends and Family: Not on file  . Frequency of Social Gatherings with Friends and Family: Not on file  . Attends Religious Services: Not on file  . Active Member of Clubs or Organizations: Not on file  . Attends Archivist Meetings: Not on file  . Marital Status: Not on file    Review of Systems  Constitutional: Negative for chills, diaphoresis, fatigue and fever.  HENT: Negative for congestion, ear pain and sore throat.   Respiratory: Negative for cough and shortness of breath.   Cardiovascular: Negative for chest pain and leg swelling.  Gastrointestinal: Negative for abdominal pain, constipation, diarrhea, nausea and vomiting.  Genitourinary: Negative for dysuria and urgency.  Musculoskeletal: Negative for arthralgias and myalgias.  Neurological: Negative for dizziness and headaches.  Psychiatric/Behavioral: Negative for dysphoric mood.     Objective:  BP 118/76   Pulse 63   Temp (!) 97.4 F (36.3 C)   Ht 5\' 10"  (1.778 m)   Wt 187 lb (84.8 kg)   SpO2 98%   BMI 26.83 kg/m   BP/Weight 11/05/2020 10/23/2020 28/0/0349  Systolic BP 179 150 569  Diastolic BP 76 78 70  Wt. (Lbs) 187 188 194.38  BMI 26.83 26.98 27.89    Physical Exam Vitals reviewed.  Constitutional:      Appearance: Normal appearance.  Cardiovascular:     Rate and Rhythm: Normal rate and regular rhythm.     Pulses: Normal pulses.     Heart sounds: Normal heart sounds.  Pulmonary:     Effort: Pulmonary effort is normal.     Breath sounds: Normal breath sounds. No wheezing, rhonchi or rales.  Neurological:     Mental Status: He is alert.  Psychiatric:        Mood and Affect: Mood normal.        Behavior: Behavior normal.      Lab Results  Component Value Date   WBC 10.9 (H) 10/23/2020   HGB 14.1 10/23/2020   HCT 40.8 10/23/2020   PLT 291 10/23/2020   GLUCOSE 86 10/23/2020   CHOL 165 10/23/2020   TRIG  73 10/23/2020   HDL 60 10/23/2020   LDLCALC 91 10/23/2020   ALT 18 10/23/2020   AST 18 10/23/2020   NA 141 10/23/2020   K 4.5 10/23/2020   CL 103 10/23/2020   CREATININE 1.05 10/23/2020   BUN 27 10/23/2020   CO2  23 10/23/2020   INR 1.03 01/09/2019   HGBA1C 5.6 12/01/2017      Assessment & Plan:   1. Hypertensive heart disease with chronic diastolic congestive heart failure (HCC) Continue Amlodipine 5 mg once at night.  Continue Labetolol 300 mg once twice a day.   Meds ordered this encounter  Medications  . labetalol (NORMODYNE) 300 MG tablet    Sig: Take 1 tablet (300 mg total) by mouth 2 (two) times daily.    Dispense:  180 tablet    Refill:  1    Please cancel all previous rxs for labetolol and carvedilol.    I spent 20 minutes dedicated to the care of this patient on the date of this encounter to include face-to-face time with the patient, as well as: reviewed medicines/labs/echo.  Follow-up: Return in about 3 months (around 02/05/2021) for fasting.[name]  An After Visit Summary was printed and given to the patient.  Rochel Brome, MD [facility] [phone]

## 2020-11-05 NOTE — Patient Instructions (Signed)
Continue Amlodipine 5 mg once at night.  Continue Labetolol 300 mg once twice a day.

## 2020-11-11 DIAGNOSIS — D485 Neoplasm of uncertain behavior of skin: Secondary | ICD-10-CM | POA: Diagnosis not present

## 2020-11-11 DIAGNOSIS — L501 Idiopathic urticaria: Secondary | ICD-10-CM | POA: Diagnosis not present

## 2020-11-18 ENCOUNTER — Other Ambulatory Visit: Payer: Self-pay

## 2020-11-18 DIAGNOSIS — I10 Essential (primary) hypertension: Secondary | ICD-10-CM | POA: Insufficient documentation

## 2020-11-18 DIAGNOSIS — M5003 Cervical disc disorder with myelopathy, cervicothoracic region: Secondary | ICD-10-CM | POA: Insufficient documentation

## 2020-11-18 DIAGNOSIS — E782 Mixed hyperlipidemia: Secondary | ICD-10-CM | POA: Insufficient documentation

## 2020-11-18 DIAGNOSIS — F411 Generalized anxiety disorder: Secondary | ICD-10-CM | POA: Insufficient documentation

## 2020-11-19 ENCOUNTER — Inpatient Hospital Stay: Payer: Medicare Other | Attending: Oncology

## 2020-11-19 ENCOUNTER — Other Ambulatory Visit: Payer: Self-pay | Admitting: Hematology and Oncology

## 2020-11-19 ENCOUNTER — Other Ambulatory Visit: Payer: Self-pay

## 2020-11-19 ENCOUNTER — Ambulatory Visit (INDEPENDENT_AMBULATORY_CARE_PROVIDER_SITE_OTHER): Payer: Medicare Other | Admitting: Cardiology

## 2020-11-19 ENCOUNTER — Encounter: Payer: Self-pay | Admitting: Cardiology

## 2020-11-19 VITALS — BP 146/84 | HR 59 | Ht 70.0 in | Wt 188.0 lb

## 2020-11-19 DIAGNOSIS — C774 Secondary and unspecified malignant neoplasm of inguinal and lower limb lymph nodes: Secondary | ICD-10-CM | POA: Diagnosis not present

## 2020-11-19 DIAGNOSIS — Z86711 Personal history of pulmonary embolism: Secondary | ICD-10-CM

## 2020-11-19 DIAGNOSIS — I5030 Unspecified diastolic (congestive) heart failure: Secondary | ICD-10-CM

## 2020-11-19 DIAGNOSIS — I10 Essential (primary) hypertension: Secondary | ICD-10-CM

## 2020-11-19 DIAGNOSIS — Z0001 Encounter for general adult medical examination with abnormal findings: Secondary | ICD-10-CM | POA: Diagnosis not present

## 2020-11-19 DIAGNOSIS — C61 Malignant neoplasm of prostate: Secondary | ICD-10-CM | POA: Insufficient documentation

## 2020-11-19 DIAGNOSIS — D649 Anemia, unspecified: Secondary | ICD-10-CM | POA: Diagnosis not present

## 2020-11-19 LAB — HEPATIC FUNCTION PANEL
ALT: 22 (ref 10–40)
AST: 27 (ref 14–40)
Alkaline Phosphatase: 88 (ref 25–125)
Bilirubin, Total: 0.7

## 2020-11-19 LAB — CBC
MCV: 91 (ref 76–111)
RBC: 4.45 (ref 3.87–5.11)

## 2020-11-19 LAB — BASIC METABOLIC PANEL
BUN: 27 — AB (ref 4–21)
CO2: 26 — AB (ref 13–22)
Chloride: 103 (ref 99–108)
Creatinine: 1.1 (ref 0.6–1.3)
Glucose: 91
Potassium: 3.9 (ref 3.4–5.3)
Sodium: 138 (ref 137–147)

## 2020-11-19 LAB — CBC AND DIFFERENTIAL
HCT: 41 (ref 41–53)
Hemoglobin: 14.1 (ref 13.5–17.5)
Neutrophils Absolute: 7.15
Platelets: 257 (ref 150–399)
WBC: 9.8

## 2020-11-19 LAB — COMPREHENSIVE METABOLIC PANEL
Albumin: 4.1 (ref 3.5–5.0)
Calcium: 9.8 (ref 8.7–10.7)

## 2020-11-19 NOTE — Progress Notes (Signed)
[facility]  [address] [place],    [address] [phone]  Clinic Day:  12/8/221  Referring physician: Rochel Brome, MD   HISTORY OF PRESENT ILLNESS:  The patient is a 79 y.o. 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  There was also evidence of disease being studded along his anal canal.  Her remains on complete androgen deprivation therapy, which consists of Lupron/darolutamide.  The patient has tolerated this combination regimen fairly well.  However, pruritus remains an issue, which he attributes to his darolutamide therapy.  His dose has already been decreased to 300 mg daily.  As it pertains to his prostate cancer, he denies having any new symptoms or findings that concern him for disease progression while on his complete androgen blockade therapy.  PHYSICAL EXAM:  Blood pressure 123/60, pulse 69, temperature 98.3 F (36.8 C), resp. rate 16, height 5\' 10"  (1.778 m), weight 190 lb 14.4 oz (86.6 kg), SpO2 94 %. Wt Readings from Last 3 Encounters:  11/20/20 190 lb 14.4 oz (86.6 kg)  11/19/20 188 lb (85.3 kg)  11/05/20 187 lb (84.8 kg)   Body mass index is 27.39 kg/m. Performance status (ECOG): 0 - Asymptomatic Physical Exam Constitutional:      Appearance: Normal appearance. He is not ill-appearing.  HENT:     Mouth/Throat:     Mouth: Mucous membranes are moist.     Pharynx: Oropharynx is clear. No oropharyngeal exudate or posterior oropharyngeal erythema.  Cardiovascular:     Rate and Rhythm: Normal rate and regular rhythm.     Heart sounds: No murmur heard.  No friction rub. No gallop.   Pulmonary:     Effort: Pulmonary effort is normal. No respiratory distress.     Breath sounds: Normal breath sounds. No wheezing, rhonchi or rales.  Abdominal:     General: Bowel sounds are normal. There is no distension.     Palpations: Abdomen is soft. There is no mass.      Tenderness: There is no abdominal tenderness.  Musculoskeletal:        General: No swelling.     Right lower leg: No edema.     Left lower leg: No edema.  Lymphadenopathy:     Cervical: No cervical adenopathy.     Upper Body:     Right upper body: No supraclavicular or axillary adenopathy.     Left upper body: No supraclavicular or axillary adenopathy.     Lower Body: No right inguinal adenopathy. No left inguinal adenopathy.  Skin:    General: Skin is warm.     Coloration: Skin is not jaundiced.     Findings: No lesion or rash.  Neurological:     General: No focal deficit present.     Mental Status: He is alert and oriented to person, place, and time. Mental status is at baseline.     Cranial Nerves: Cranial nerves are intact.  Psychiatric:        Mood and Affect: Mood normal.        Behavior: Behavior normal.        Thought Content: Thought content normal.     LABS:   CBC Latest Ref Rng & Units 11/19/2020 10/23/2020 09/19/2020  WBC - 9.8 10.9(H) 7.7  Hemoglobin 13.5 - 17.5 14.1 14.1 13.8  Hematocrit 41 - 53 41 40.8 41  Platelets 150 - 399 257 291 275   CMP Latest Ref Rng &  Units 11/19/2020 10/23/2020 09/19/2020  Glucose 65 - 99 mg/dL - 86 -  BUN 4 - 21 27(A) 27 24(A)  Creatinine 0.6 - 1.3 1.1 1.05 1.1  Sodium 137 - 147 138 141 138  Potassium 3.4 - 5.3 3.9 4.5 4.0  Chloride 99 - 108 103 103 103  CO2 13 - 22 26(A) 23 24(A)  Calcium 8.7 - 10.7 9.8 9.6 9.2  Total Protein 6.0 - 8.5 g/dL - 6.4 -  Total Bilirubin 0.0 - 1.2 mg/dL - 0.8 -  Alkaline Phos 25 - 125 88 73 -  AST 14 - 40 27 18 -  ALT 10 - 40 22 18 -    Lab Results  Component Value Date   PSA1 0.3 11/19/2020    Ref. Range 08/02/2020 11:02 09/19/2020 00:00 10/23/2020 10:33 11/19/2020 00:00 11/19/2020 11:41  Prostate Specific Ag, Serum Latest Ref Range: 0.0 - 4.0 ng/mL 0.6    0.3     ASSESSMENT & PLAN:  Assessment/Plan:  A 79 y.o. male with metastatic prostate cancer.  I am pleased as his PSA level continues to  fall while on his complete androgen blockade therapy.   The only problem that persists is his skin rash, for which he believes his darolutamide is responsible.  As his PSA continues to fall and he is clinically doing well, I have told him to decrease his darolutamide to 300 mg 4/7 days per week.  His PSA level will continue to be followed closely to ensure his disease is not getting worse to where a change in therapy may need to be considered.  I will see him back in 2 months for repeat clinical assessment.  The patient understands all the plans discussed today and is in agreement with them.      Kamsiyochukwu Buist Macarthur Critchley, MD

## 2020-11-19 NOTE — Patient Instructions (Signed)
Medication Instructions:  Your physician recommends that you continue on your current medications as directed. Please refer to the Current Medication list given to you today.  *If you need a refill on your cardiac medications before your next appointment, please call your pharmacy*   Lab Work: None If you have labs (blood work) drawn today and your tests are completely normal, you will receive your results only by: MyChart Message (if you have MyChart) OR A paper copy in the mail If you have any lab test that is abnormal or we need to change your treatment, we will call you to review the results.   Testing/Procedures: None   Follow-Up: At [facility], you and your health needs are our priority.  As part of our continuing mission to provide you with exceptional heart care, we have created designated Provider Care Teams.  These Care Teams include your primary Cardiologist (physician) and Advanced Practice Providers (APPs -  Physician Assistants and Nurse Practitioners) who all work together to provide you with the care you need, when you need it.  We recommend signing up for the patient portal called "MyChart".  Sign up information is provided on this After Visit Summary.  MyChart is used to connect with patients for Virtual Visits (Telemedicine).  Patients are able to view lab/test results, encounter notes, upcoming appointments, etc.  Non-urgent messages can be sent to your provider as well.   To learn more about what you can do with MyChart, go to [email or web address].    Your next appointment:   1 month(s)  The format for your next appointment:   In Person  Provider:   Robert Krasowski, MD   Other Instructions   

## 2020-11-19 NOTE — Progress Notes (Signed)
Cardiology Office Note:    Date:  11/19/2020   ID:  Marcus King, DOB [birth year], MRN [id number]  PCP:  Marcus Brome, MD  Cardiologist:  Jenne Campus, MD    Referring MD: Marcus Brome, MD   Chief Complaint  Patient presents with  . Unstable balance due bue BP changes    History of Present Illness:    Marcus King is a 79 y.o. male with past medical history significant for orthostatic hypotension, essential hypertension, obstructive sleep apnea, history of pulmonary emboli diagnosed in March, prostate cancer.  He comes today 2 months for follow-up.  We will he had difficulty controlling his blood pressure he brought measurements to me and blood pressures all over the place.  He said usually he wakes up he will have high blood pressure then he takes his medications then mid morning he feels absolutely miserable weak and tired.  There was some measurements that his blood pressure was low.  He also got dizziness when he gets up.  It made him difficult to function overall.  To the point that he get a two-story house he does not go upstairs since March because he is where he is in a fall down.  Past Medical History:  Diagnosis Date  . Anxiety   . Cervical disc disorder with myelopathy, cervicothoracic region   . Chronic pain of left knee 08/11/2016  . 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 Congestive heart failure with LV diastolic dysfunction, NYHA class 1 ([place]) 09/21/2018  . Dyslipidemia 08/11/2017  . Dyspnea on exertion 04/23/2020  . Edema extremities 04/14/2018  . Elevated glucose 10/17/2019  . Fatigue 04/23/2020  . Food poisoning   . GAD (generalized anxiety disorder)   . Headache    sinus  . History of pulmonary embolus (PE) 06/13/2020  . Hypertension   . Hypertensive heart disease 08/05/2017  . Hypoxia 10/17/2019  . Inguinal lymphadenopathy 05/26/2020  . LVH (left ventricular hypertrophy) due to hypertensive disease 08/11/2017  . Malignant neoplasm of prostate ([place])  09/09/2020  . Mixed hyperlipidemia   . Orthostatic hypotension   . Orthostatic syncope 10/17/2019  . OSA on CPAP   . Prostate cancer ([place])   . Pulmonary embolism ([place]) 04/2020  . PVC's (premature ventricular contractions) 05/18/2018  . Rectal mass 05/24/2020  . 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 Solid nodule of lung greater than 8 mm in diameter 05/26/2020    Past Surgical History:  Procedure Laterality Date  . CARDIAC CATHETERIZATION    . CATARACT EXTRACTION W/ INTRAOCULAR LENS  IMPLANT, BILATERAL Bilateral   . COLON SURGERY  2014  . COLOSTOMY REVERSAL  2014  . HERNIA REPAIR     x2  . JOINT REPLACEMENT    . PROSTATE SURGERY  2006  . SEPTOPLASTY    . TOTAL KNEE ARTHROPLASTY Left 09/28/2016   Procedure: TOTAL KNEE ARTHROPLASTY;  Surgeon: [name], MD;  Location: [facility];  Service: Orthopedics;  Laterality: Left;    Current Medications: Current Meds  Medication Sig  . amLODipine (NORVASC) 5 MG tablet Take 1 tablet (5 mg total) by mouth daily.  [name] apixaban (ELIQUIS) 5 MG TABS tablet Take 1 tablet (5 mg total) by mouth 2 (two) times daily.  . busPIRone (BUSPAR) 30 MG tablet Take 1 tablet (30 mg total) by mouth 2 (two) times daily.  . darolutamide (NUBEQA) 300 MG tablet Take 600 mg by mouth daily.  [name] dexamethasone (DECADRON) 1 MG tablet Take 1 mg  by mouth every morning.  . hydrOXYzine (ATARAX/VISTARIL) 25 MG tablet TAKE 1-2 TABLETS BY MOUTH DAILY AS NEEDED FOR ITCHING  . labetalol (NORMODYNE) 300 MG tablet Take 1 tablet (300 mg total) by mouth 2 (two) times daily.  [name] Leuprolide Acetate, 3 Month, (LUPRON DEPOT, 50-MONTH, IM) Inject into the muscle.  [name] PARoxetine (PAXIL) 40 MG tablet TAKE ONE TABLET BY MOUTH EVERY DAY  . pravastatin (PRAVACHOL) 10 MG tablet TAKE 1 TABLET BY MOUTH AT BEDTIME  . triamcinolone (KENALOG) 0.1 % Apply 1 application topically 3 (three) times daily as needed.     Allergies:   Beef-derived products, Clonazepam, Monosodium  glutamate, and Pegademase bovine   Social History   Socioeconomic History  . Marital status: Married    Spouse name: Not on file  . Number of children: 2  . Years of education: Not on file  . Highest education level: High school graduate  Occupational History  . Occupation: Engineer, petroleum    Comment: ICDs/Wound Care  Tobacco Use  . Smoking status: Former Smoker    Years: 20.00    Types: Cigarettes    Quit date: 1979    Years since quitting: 42.9  . Smokeless tobacco: Never Used  . Tobacco comment: quit 37 years ago  Vaping Use  . Vaping Use: Never used  Substance and Sexual Activity  . Alcohol use: No  . Drug use: No  . Sexual activity: Not on file  Other Topics Concern  . Not on file  Social History Narrative   LIves at home with his wife   Self employed   Right handed   Drinks about 2 cups of caffeine daily   Social Determinants of Health   Financial Resource Strain:   . Difficulty of Paying Living Expenses: Not on file  Food Insecurity: No Food Insecurity  . Worried About Charity fundraiser in the Last Year: Never true  . Ran Out of Food in the Last Year: Never true  Transportation Needs:   . Lack of Transportation (Medical): Not on file  . Lack of Transportation (Non-Medical): Not on file  Physical Activity:   . Days of Exercise per Week: Not on file  . Minutes of Exercise per Session: Not on file  Stress:   . Feeling of Stress : Not on file  Social Connections:   . Frequency of Communication with Friends and Family: Not on file  . Frequency of Social Gatherings with Friends and Family: Not on file  . Attends Religious Services: Not on file  . Active Member of Clubs or Organizations: Not on file  . Attends Archivist Meetings: Not on file  . Marital Status: Not on file     Family History: The patient's family history includes Alcoholism in his father; Leukemia in his sister; Lung disease in his father; Muscular dystrophy in his son;  Parkinson's disease in his mother; Stroke in his father and mother. There is no history of Alzheimer's disease. ROS:   Please see the history of present illness.    All 14 point review of systems negative except as described per history of present illness  EKGs/Labs/Other Studies Reviewed:      Recent Labs: 11/19/2020: ALT 22; BUN 27; Creatinine 1.1; Hemoglobin 14.1; Platelets 257; Potassium 3.9; Sodium 138  Recent Lipid Panel    Component Value Date/Time   CHOL 165 10/23/2020 1033   TRIG 73 10/23/2020 1033   HDL 60 10/23/2020 1033   CHOLHDL 2.8 10/23/2020 1033  [place] 91 10/23/2020 1033    Physical Exam:    VS:  BP (!) 146/84 (BP Location: Right Arm, Patient Position: Sitting)   Pulse (!) 59   Ht 5\' 10"  (1.778 m)   Wt 188 lb (85.3 kg)   SpO2 95%   BMI 26.98 kg/m     Wt Readings from Last 3 Encounters:  11/19/20 188 lb (85.3 kg)  11/05/20 187 lb (84.8 kg)  10/23/20 188 lb (85.3 kg)     GEN:  Well nourished, well developed in no acute distress HEENT: Normal NECK: No JVD; No carotid bruits LYMPHATICS: No lymphadenopathy CARDIAC: RRR, no murmurs, no rubs, no gallops RESPIRATORY:  Clear to auscultation without rales, wheezing or rhonchi  ABDOMEN: Soft, non-tender, non-distended MUSCULOSKELETAL:  No edema; No deformity  SKIN: Warm and dry LOWER EXTREMITIES: no swelling NEUROLOGIC:  Alert and oriented x 3 PSYCHIATRIC:  Normal affect   ASSESSMENT:    1. Primary hypertension   2. History of pulmonary embolism   3. Prostate cancer ([place])   4. Congestive heart failure with LV diastolic dysfunction, NYHA class 1 ([place])    PLAN:    In order of problems listed above:  1. Essential hypertension which is difficult to control.  I will make arrangement for him to have 24 hours blood pressure monitor to see exactly what the distribution of his blood pressure is.  In the meantime I will not change any of his medications. 2. History of pulmonary emboli overall clinically  doing very well.  Denies have any shortness of breath.  He is anticoagulated which I will continue. 3. Prostate cancer: Followed by oncologist/urology doing well from that point review. 4. Congestive heart failure with diastolic dysfunction.  Doing well from that point review.   Medication Adjustments/Labs and Tests Ordered: Current medicines are reviewed at length with the patient today.  Concerns regarding medicines are outlined above.  No orders of the defined types were placed in this encounter.  Medication changes: No orders of the defined types were placed in this encounter.   Signed, Park Liter, MD, [facility] 11/19/2020 1:26 PM    [facility]

## 2020-11-20 ENCOUNTER — Other Ambulatory Visit: Payer: Self-pay | Admitting: Family Medicine

## 2020-11-20 ENCOUNTER — Other Ambulatory Visit: Payer: Self-pay | Admitting: Oncology

## 2020-11-20 ENCOUNTER — Telehealth: Payer: Self-pay | Admitting: Oncology

## 2020-11-20 ENCOUNTER — Encounter: Payer: Self-pay | Admitting: Oncology

## 2020-11-20 ENCOUNTER — Inpatient Hospital Stay (INDEPENDENT_AMBULATORY_CARE_PROVIDER_SITE_OTHER): Payer: Medicare Other | Admitting: Oncology

## 2020-11-20 VITALS — BP 123/60 | HR 69 | Temp 98.3°F | Resp 16 | Ht 70.0 in | Wt 190.9 lb

## 2020-11-20 DIAGNOSIS — C61 Malignant neoplasm of prostate: Secondary | ICD-10-CM

## 2020-11-20 LAB — PROSTATE-SPECIFIC AG, SERUM (LABCORP): Prostate Specific Ag, Serum: 0.3 ng/mL (ref 0.0–4.0)

## 2020-11-20 NOTE — Telephone Encounter (Signed)
Per 12/8 los next appt given to patient

## 2020-11-21 ENCOUNTER — Other Ambulatory Visit: Payer: Self-pay | Admitting: Cardiology

## 2020-11-21 ENCOUNTER — Ambulatory Visit (HOSPITAL_COMMUNITY)
Admission: RE | Admit: 2020-11-21 | Discharge: 2020-11-21 | Disposition: A | Discharge status: Home / Self Care | Payer: Medicare Other | Source: Ambulatory Visit | Attending: Cardiology | Admitting: Cardiology

## 2020-11-21 ENCOUNTER — Other Ambulatory Visit: Payer: Self-pay | Admitting: *Deleted

## 2020-11-21 ENCOUNTER — Encounter: Payer: Self-pay | Admitting: Cardiology

## 2020-11-21 ENCOUNTER — Ambulatory Visit: Payer: Medicare Other

## 2020-11-21 ENCOUNTER — Ambulatory Visit (INDEPENDENT_AMBULATORY_CARE_PROVIDER_SITE_OTHER): Payer: Medicare Other

## 2020-11-21 DIAGNOSIS — I110 Hypertensive heart disease with heart failure: Secondary | ICD-10-CM

## 2020-11-21 DIAGNOSIS — I951 Orthostatic hypotension: Secondary | ICD-10-CM | POA: Insufficient documentation

## 2020-11-21 DIAGNOSIS — I10 Essential (primary) hypertension: Secondary | ICD-10-CM

## 2020-11-21 DIAGNOSIS — R030 Elevated blood-pressure reading, without diagnosis of hypertension: Secondary | ICD-10-CM | POA: Diagnosis not present

## 2020-11-22 ENCOUNTER — Encounter: Payer: Self-pay | Admitting: Cardiology

## 2020-11-22 ENCOUNTER — Other Ambulatory Visit: Payer: Self-pay | Admitting: Cardiology

## 2020-11-22 DIAGNOSIS — I493 Ventricular premature depolarization: Secondary | ICD-10-CM

## 2020-11-22 DIAGNOSIS — I5030 Unspecified diastolic (congestive) heart failure: Secondary | ICD-10-CM

## 2020-11-22 DIAGNOSIS — I951 Orthostatic hypotension: Secondary | ICD-10-CM

## 2020-11-22 DIAGNOSIS — I110 Hypertensive heart disease with heart failure: Secondary | ICD-10-CM

## 2020-11-22 DIAGNOSIS — I10 Essential (primary) hypertension: Secondary | ICD-10-CM

## 2020-11-27 ENCOUNTER — Ambulatory Visit (INDEPENDENT_AMBULATORY_CARE_PROVIDER_SITE_OTHER): Payer: Medicare Other | Admitting: Family Medicine

## 2020-11-27 ENCOUNTER — Ambulatory Visit: Payer: Medicare Other

## 2020-11-27 ENCOUNTER — Other Ambulatory Visit: Payer: Self-pay

## 2020-11-27 ENCOUNTER — Encounter: Payer: Self-pay | Admitting: Family Medicine

## 2020-11-27 VITALS — BP 118/64 | HR 66 | Resp 18 | Ht 70.0 in | Wt 192.6 lb

## 2020-11-27 DIAGNOSIS — H547 Unspecified visual loss: Secondary | ICD-10-CM

## 2020-11-27 NOTE — Patient Instructions (Addendum)
  Patient Care Team: Rochel Brome, MD as PCP - General (Family Medicine) Marice Potter, MD as PCP - Hematology/Oncology (Oncology) Burnice Logan, [facility] as Pharmacist (Pharmacist) Richardo Priest, MD as Consulting Physician (Cardiology) Jolene Schimke, MD as Referring Physician (Dermatology) Edrick Oh, MD as Consulting Physician (Nephrology)      Marcus King , Thank you for taking time to come for your Medicare Wellness Visit. I appreciate your ongoing commitment to your health goals. Please review the following plan we discussed and let me know if I can assist you in the future.   These are the goals we discussed: Will check on colonoscopy recommendations.Continue to monitor blood pressure Goals    . Pharmacy Care Plan     CARE PLAN ENTRY  Current Barriers:  . Chronic Disease Management support, education, and care coordination needs related to Hypertension and Hyperlipidemia   Hypertension . Pharmacist Clinical Goal(s): o Over the next 90 days, patient will work with PharmD and providers to maintain BP goal <140/90 . Current regimen:  o Carvedilol 25 mg bid, Furosemide 20 mg daily, Amlodipine 5 mg daily . Interventions: o Recommend patient keep up the good work controlling salt intake.  o Recommend patient keep up the good work walking for exercise.  . Patient self care activities - Over the next 90 days, patient will: o Check BP twice daily, document, and provide at future appointments o Ensure daily salt intake < 2300 mg/day  Hyperlipidemia . Pharmacist Clinical Goal(s): o Over the next 90 days, patient will work with PharmD and providers to maintain LDL goal < 100 . Current regimen:  o Pravastatin 10 mg daily . Interventions: o Recommend patient continue to walk for exercise.  o Recommend patient keep up the good work eating a heart healhty diet.  . Patient self care activities - Over the next 90 days, patient will: o Continue to do a great job taking medication  as prescribed.  Medication management . Pharmacist Clinical Goal(s): o Over the next 90 days, patient will work with PharmD and providers to maintain optimal medication adherence . Current pharmacy: [facility] . Interventions o Comprehensive medication review performed. o Continue current medication management strategy . Patient self care activities - Over the next 90 days, patient will: o Focus on medication adherence by continuing to use pill box.  o Take medications as prescribed o Report any questions or concerns to PharmD and/or provider(s)  Initial goal documentation        This is a list of the screening recommended for you and due dates:  Health Maintenance  Topic Date Due  . Tetanus Vaccine  12/12/2019  . COVID-19 Vaccine (2 - Booster for [employer] series) 05/07/2020  . Flu Shot  Completed  . Pneumonia vaccines  Completed

## 2020-11-27 NOTE — Progress Notes (Signed)
Subjective:   Marcus King is a 79 y.o. male who presents for Medicare Annual/Subsequent preventive examination. Cardiac Risk Factors include: advanced age (>30men, >55 women);hypertension Pt states J and J vaccine caused blood clots-PE    Objective:    Today's Vitals   11/27/20 1418  BP: 118/64  Pulse: 66  Resp: 18  SpO2: 97%  Weight: 192 lb 9.6 oz (87.4 kg)  Height: 5\' 10"  (1.778 m)   Body mass index is 27.64 kg/m.  Advanced Directives 11/27/2020 09/29/2016 09/18/2016  Does Patient Have a Medical Advance Directive? Yes Yes Yes  Type of Paramedic of [place];Living will Living will [place];Living will  Does patient want to make changes to medical advance directive? - No - Patient declined No - Patient declined  Copy of [place] in Chart? No - copy requested Yes No - copy requested    Current Medications (verified) Outpatient Encounter Medications as of 11/27/2020  Medication Sig  . amLODipine (NORVASC) 5 MG tablet Take 1 tablet (5 mg total) by mouth daily.  [name] apixaban (ELIQUIS) 5 MG TABS tablet Take 1 tablet (5 mg total) by mouth 2 (two) times daily.  . busPIRone (BUSPAR) 30 MG tablet Take 1 tablet (30 mg total) by mouth 2 (two) times daily.  . carvedilol (COREG) 25 MG tablet Take 25 mg by mouth 2 (two) times daily.  . darolutamide (NUBEQA) 300 MG tablet Take 600 mg by mouth daily.  [name] dexamethasone (DECADRON) 1 MG tablet Take 1 mg by mouth every morning.  . furosemide (LASIX) 20 MG tablet Take 20 mg by mouth daily.  . hydrOXYzine (ATARAX/VISTARIL) 25 MG tablet TAKE 1-2 TABLETS BY MOUTH DAILY AS NEEDED FOR ITCHING  . labetalol (NORMODYNE) 300 MG tablet Take 1 tablet (300 mg total) by mouth 2 (two) times daily.  [name] Leuprolide Acetate, 3 Month, (LUPRON DEPOT, 39-MONTH, IM) Inject into the muscle.  . lisinopril (ZESTRIL) 20 MG tablet Take 20 mg by mouth 2 (two) times daily.  [name] PARoxetine (PAXIL) 40 MG  tablet TAKE ONE TABLET BY MOUTH EVERY DAY  . pravastatin (PRAVACHOL) 10 MG tablet TAKE 1 TABLET BY MOUTH AT BEDTIME  . triamcinolone (KENALOG) 0.1 % Apply 1 application topically 3 (three) times daily as needed.   No facility-administered encounter medications on file as of 11/27/2020.    Allergies (verified) Beef-derived products, Clonazepam, Monosodium glutamate, and Pegademase bovine   History: Past Medical History:  Diagnosis Date  . Anxiety   . Cervical disc disorder with myelopathy, cervicothoracic region   . Chronic pain of left knee 08/11/2016  . 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 Congestive heart failure with LV diastolic dysfunction, NYHA class 1 ([place]) 09/21/2018  . Dyslipidemia 08/11/2017  . Dyspnea on exertion 04/23/2020  . Edema extremities 04/14/2018  . Elevated glucose 10/17/2019  . Fatigue 04/23/2020  . Food poisoning   . GAD (generalized anxiety disorder)   . Headache    sinus  . History of pulmonary embolus (PE) 06/13/2020  . Hypertension   . Hypertensive heart disease 08/05/2017  . Hypoxia 10/17/2019  . Inguinal lymphadenopathy 05/26/2020  . LVH (left ventricular hypertrophy) due to hypertensive disease 08/11/2017  . Malignant neoplasm of prostate ([place]) 09/09/2020  . Mixed hyperlipidemia   . Orthostatic hypotension   . Orthostatic syncope 10/17/2019  . OSA on CPAP   . Prostate cancer ([place])   . Pulmonary embolism ([place]) 04/2020  . PVC's (premature ventricular contractions) 05/18/2018  .  Rectal mass 05/24/2020  . 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 Solid nodule of lung greater than 8 mm in diameter 05/26/2020   Past Surgical History:  Procedure Laterality Date  . CARDIAC CATHETERIZATION    . CATARACT EXTRACTION W/ INTRAOCULAR LENS  IMPLANT, BILATERAL Bilateral   . COLON SURGERY  2014  . COLOSTOMY REVERSAL  2014  . HERNIA REPAIR     x2  . JOINT REPLACEMENT    . PROSTATE SURGERY  2006  . SEPTOPLASTY    . TOTAL KNEE ARTHROPLASTY  Left 09/28/2016   Procedure: TOTAL KNEE ARTHROPLASTY;  Surgeon: [name], MD;  Location: [facility];  Service: Orthopedics;  Laterality: Left;   Family History  Problem Relation Age of Onset  . Stroke Mother   . Parkinson's disease Mother   . Lung disease Father   . Stroke Father   . Alcoholism Father   . Leukemia Sister   . Muscular dystrophy Son        Immunologist  . Alzheimer's disease Neg Hx    Social History   Socioeconomic History  . Marital status: Married    Spouse name: Not on file  . Number of children: 2  . Years of education: Not on file  . Highest education level: High school graduate  Occupational History  . Occupation: Engineer, petroleum    Comment: ICDs/Wound Care  Tobacco Use  . Smoking status: Former Smoker    Years: 20.00    Types: Cigarettes    Quit date: 1979    Years since quitting: 42.9  . Smokeless tobacco: Never Used  . Tobacco comment: quit 37 years ago  Vaping Use  . Vaping Use: Never used  Substance and Sexual Activity  . Alcohol use: No  . Drug use: No  . Sexual activity: Not on file  Other Topics Concern  . Not on file  Social History Narrative   LIves at home with his wife   Self employed   Right handed   Drinks about 2 cups of caffeine daily   Social Determinants of Health   Financial Resource Strain: Not on file  Food Insecurity: No Food Insecurity  . Worried About Charity fundraiser in the Last Year: Never true  . Ran Out of Food in the Last Year: Never true  Transportation Needs: Not on file  Physical Activity: Not on file  Stress: Not on file  Social Connections: Not on file    Tobacco Counseling Counseling given: Not Answered Comment: quit 37 years ago   Clinical Intake:  Pre-visit preparation completed: Yes  Pain : No/denies pain     Nutritional Risks: None  How often do you need to have someone help you when you read instructions, pamphlets, or other written materials from your doctor or pharmacy?: 1 -  Never What is the last grade level you completed in school?: 12  Diabetic?No  Interpreter Needed?: No      Activities of Daily Living In your present state of health, do you have any difficulty performing the following activities: 11/27/2020 04/23/2020  Hearing? N N  Vision? Y N  Difficulty concentrating or making decisions? Y N  Walking or climbing stairs? Y N  Dressing or bathing? N N  Doing errands, shopping? N N  Preparing Food and eating ? N -  Using the Toilet? Y -  In the past six months, have you accidently leaked urine? Y -  Do you have problems with loss  of bowel control? N -  Managing your Medications? N -  Managing your Finances? N -  Housekeeping or managing your Housekeeping? N -  Some recent data might be hidden    Patient Care Team: Rochel Brome, MD as PCP - General (Family Medicine) Marice Potter, MD as PCP - Hematology/Oncology (Oncology) Burnice Logan, [facility] as Pharmacist (Pharmacist) Richardo Priest, MD as Consulting Physician (Cardiology) Jolene Schimke, MD as Referring Physician (Dermatology) Edrick Oh, MD as Consulting Physician (Nephrology)     Assessment:   This is a routine wellness examination for [place].  Hearing/Vision screen  Hearing Screening   125Hz  250Hz  500Hz  1000Hz  2000Hz  3000Hz  4000Hz  6000Hz  8000Hz   Right ear:           Left ear:             Visual Acuity Screening   Right eye Left eye Both eyes  Without correction:     With correction: 20/50 20/50 20/40     Dietary issues and exercise activities discussed: Current Exercise Habits: The patient does not participate in regular exercise at present, Exercise limited by: Other - see comments (falls and meds)  Goals    . Pharmacy Care Plan     CARE PLAN ENTRY  Current Barriers:  . Chronic Disease Management support, education, and care coordination needs related to Hypertension and Hyperlipidemia   Hypertension . Pharmacist Clinical Goal(s): o Over the next 90 days,  patient will work with PharmD and providers to maintain BP goal <140/90 . Current regimen:  o Carvedilol 25 mg bid, Furosemide 20 mg daily, Amlodipine 5 mg daily . Interventions: o Recommend patient keep up the good work controlling salt intake.  o Recommend patient keep up the good work walking for exercise.  . Patient self care activities - Over the next 90 days, patient will: o Check BP twice daily, document, and provide at future appointments o Ensure daily salt intake < 2300 mg/day  Hyperlipidemia . Pharmacist Clinical Goal(s): o Over the next 90 days, patient will work with PharmD and providers to maintain LDL goal < 100 . Current regimen:  o Pravastatin 10 mg daily . Interventions: o Recommend patient continue to walk for exercise.  o Recommend patient keep up the good work eating a heart healhty diet.  . Patient self care activities - Over the next 90 days, patient will: o Continue to do a great job taking medication as prescribed.  Medication management . Pharmacist Clinical Goal(s): o Over the next 90 days, patient will work with PharmD and providers to maintain optimal medication adherence . Current pharmacy: [facility] . Interventions o Comprehensive medication review performed. o Continue current medication management strategy . Patient self care activities - Over the next 90 days, patient will: o Focus on medication adherence by continuing to use pill box.  o Take medications as prescribed o Report any questions or concerns to PharmD and/or provider(s)  Initial goal documentation       Depression Screen PHQ 2/9 Scores 11/27/2020 05/08/2020 04/23/2020  PHQ - 2 Score 0 0 0    Fall Risk Fall Risk  11/27/2020 11/08/2019 11/02/2018 07/13/2018 08/10/2016  Falls in the past year? 1 1 0 Yes No  Comment - Emmi Telephone Survey: data to providers prior to load Emmi Telephone Survey: data to providers prior to load Emmi Telephone Survey: data to providers prior to load Emmi  Telephone Survey: data to providers prior to load  Number falls in past yr: 1 1 -  1 -  Comment - Emmi Telephone Survey Actual Response = 2 - Emmi Telephone Survey Actual Response = 1 -  Injury with Fall? 1 1 - Yes -  Risk for fall due to : Medication side effect - - - -    FALL RISK PREVENTION PERTAINING TO THE HOME: Fall in [place] evaluation-x2 fracture nose-interaction between medications. Pt feels medication is working better Any stairs in or around the home? Stairs -2 story If so, are there any without handrails? handrails Home free of loose throw rugs in walkways, pet beds, electrical cords, etc? no fall risk Adequate lighting in your home to reduce risk of falls?  Lights in home [place]:  Life alert? no Use of a cane, walker or w/c? No cane Grab bars in the bathroom? Step over tub-grab bar Shower chair or bench in shower? Shower stool Elevated toilet seat or a handicapped toilet? Pt has an elevated toilet  Cognitive Function: MMSE - Mini Mental State Exam 12/01/2017  Orientation to time 5  Orientation to Place 5  Registration 3  Attention/ Calculation 5  Recall 1  Language- name 2 objects 2  Language- repeat 1  Language- follow 3 step command 3  Language- read & follow direction 1  Write a sentence 1  Copy design 1  Total score 28     6CIT Screen 11/27/2020  What Year? 0 points  What month? 0 points  What time? 0 points  Count back from 20 2 points  Months in reverse 0 points  Repeat phrase 10 points  Total Score 12    Immunizations Immunization History  Administered Date(s) Administered  . Fluad Quad(high Dose 65+) 09/25/2020  . Influenza-Unspecified 09/07/2016, 09/13/2018  . Janssen (J&J) SARS-COV-2 Vaccination 03/12/2020  . Pneumococcal Conjugate-13 11/11/2015  . Pneumococcal Polysaccharide-23 12/11/2009, 12/26/2012  . Td 12/11/2009  . Zoster Recombinat (Shingrix) 09/23/2018, 11/29/2018   Screening  Tests Health Maintenance  Topic Date Due  . TETANUS/TDAP  12/12/2019  . COVID-19 Vaccine (2 - Booster for [employer] series) 05/07/2020  . INFLUENZA VACCINE  Completed  . PNA vac Low Risk Adult  Completed    Health Maintenance  Health Maintenance Due  Topic Date Due  . TETANUS/TDAP  12/12/2019  . COVID-19 Vaccine (2 - Booster for Janssen series) 05/07/2020   Colonoscopy-date unknown Additional Screening:  Vision Screening: Recommended annual ophthalmology exams for early detection of glaucoma and other disorders of the eye. Pt needs a new provider Is the patient up to date with their annual eye exam?  need a referral for eye physician Who is the provider or what is the name of the office in which the patient attends annual eye exams? If pt is not established with a provider, would they like to be referred to a provider to establish care? Pt needs a new provider  Dental Screening: Recommended annual dental exams for proper oral hygiene-dentures    Plan:    I have personally reviewed and noted the following in the patient's chart:   . Medical and social history . Use of alcohol, tobacco or illicit drugs  . Current medications and supplements . Functional ability and status . Nutritional status . Physical activity . Advanced directives . List of other physicians . Hospitalizations, surgeries, and ER visits in previous 12 months . Vitals . Screenings to include cognitive, depression, and falls . Referrals and appointments  In addition, I have reviewed and discussed with patient certain preventive protocols, quality metrics, and best  practice recommendations. A written personalized care plan for preventive services as well as general preventive health recommendations were provided to patient.    Mertha Baars, MD   11/27/2020

## 2020-12-02 ENCOUNTER — Other Ambulatory Visit: Payer: Self-pay | Admitting: Cardiology

## 2020-12-02 DIAGNOSIS — R030 Elevated blood-pressure reading, without diagnosis of hypertension: Secondary | ICD-10-CM

## 2020-12-04 ENCOUNTER — Encounter: Payer: Self-pay | Admitting: Family Medicine

## 2020-12-04 ENCOUNTER — Other Ambulatory Visit: Payer: Self-pay

## 2020-12-04 ENCOUNTER — Ambulatory Visit (INDEPENDENT_AMBULATORY_CARE_PROVIDER_SITE_OTHER): Payer: Medicare Other | Admitting: Family Medicine

## 2020-12-04 VITALS — BP 126/70 | HR 62 | Temp 97.5°F | Resp 17 | Ht 70.0 in | Wt 190.0 lb

## 2020-12-04 DIAGNOSIS — I110 Hypertensive heart disease with heart failure: Secondary | ICD-10-CM | POA: Diagnosis not present

## 2020-12-04 DIAGNOSIS — I5032 Chronic diastolic (congestive) heart failure: Secondary | ICD-10-CM | POA: Diagnosis not present

## 2020-12-04 NOTE — Progress Notes (Signed)
Acute Office Visit  Subjective:    Patient ID: Marcus King, male    DOB: May 27, 1941, 79 y.o.   MRN: [id number]  Chief Complaint  Patient presents with  . Discuss abouth booster shot    HPI Patient is in today for booster shot. He received J & J covid shot in 03/12/2020 however after that shot his oxygen level went down from 92%  to 82%. Dr Joud Pettinato sent to the ED. He wants to talk about to get the booster shot.  Hypertension: currently on carvedilol 25 mg one twice a day, lisinopril 20 mg one twice a day, amlodipine 5 mg once daily, and labetolol 300 mg once daily in am.  Past Medical History:  Diagnosis Date  . Anxiety   . Cervical disc disorder with myelopathy, cervicothoracic region   . Chronic pain of left knee 08/11/2016  . 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 Congestive heart failure with LV diastolic dysfunction, NYHA class 1 ([place]) 09/21/2018  . Dyslipidemia 08/11/2017  . Dyspnea on exertion 04/23/2020  . Edema extremities 04/14/2018  . Elevated glucose 10/17/2019  . Fatigue 04/23/2020  . Food poisoning   . GAD (generalized anxiety disorder)   . Headache    sinus  . History of pulmonary embolism 06/13/2020  . History of pulmonary embolus (PE) 06/13/2020  . Hypertension   . Hypertensive heart disease 08/05/2017  . Hypoxia 10/17/2019  . Inguinal lymphadenopathy 05/26/2020  . LVH (left ventricular hypertrophy) due to hypertensive disease 08/11/2017  . Malignant neoplasm of prostate ([place]) 09/09/2020  . Mixed hyperlipidemia   . Orthostatic hypotension   . Orthostatic syncope 10/17/2019  . OSA on CPAP   . Prostate cancer ([place])   . Pulmonary embolism ([place]) 04/2020  . PVC's (premature ventricular contractions) 05/18/2018  . Rectal mass 05/24/2020  . 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 Solid nodule of lung greater than 8 mm in diameter 05/26/2020    Past Surgical History:  Procedure Laterality Date  . CARDIAC CATHETERIZATION    . CATARACT  EXTRACTION W/ INTRAOCULAR LENS  IMPLANT, BILATERAL Bilateral   . COLON SURGERY  2014  . COLOSTOMY REVERSAL  2014  . HERNIA REPAIR     x2  . JOINT REPLACEMENT    . PROSTATE SURGERY  2006  . SEPTOPLASTY    . TOTAL KNEE ARTHROPLASTY Left 09/28/2016   Procedure: TOTAL KNEE ARTHROPLASTY;  Surgeon: [name], MD;  Location: [facility];  Service: Orthopedics;  Laterality: Left;    Family History  Problem Relation Age of Onset  . Stroke Mother   . Parkinson's disease Mother   . Lung disease Father   . Stroke Father   . Alcoholism Father   . Leukemia Sister   . Muscular dystrophy Son        Immunologist  . Alzheimer's disease Neg Hx     Social History   Socioeconomic History  . Marital status: Married    Spouse name: Not on file  . Number of children: 2  . Years of education: Not on file  . Highest education level: High school graduate  Occupational History  . Occupation: Engineer, petroleum    Comment: ICDs/Wound Care  Tobacco Use  . Smoking status: Former Smoker    Years: 20.00    Types: Cigarettes    Quit date: 1979    Years since quitting: 43.0  . Smokeless tobacco: Never Used  . Tobacco comment: quit 37 years ago  Vaping Use  .  Vaping Use: Never used  Substance and Sexual Activity  . Alcohol use: No  . Drug use: No  . Sexual activity: Not on file  Other Topics Concern  . Not on file  Social History Narrative   LIves at home with his wife   Self employed   Right handed   Drinks about 2 cups of caffeine daily   Social Determinants of Health   Financial Resource Strain: Not on file  Food Insecurity: No Food Insecurity  . Worried About Charity fundraiser in the Last Year: Never true  . Ran Out of Food in the Last Year: Never true  Transportation Needs: Not on file  Physical Activity: Not on file  Stress: Not on file  Social Connections: Not on file  Intimate Partner Violence: Not on file    Outpatient Medications Prior to Visit  Medication Sig Dispense Refill   . amLODipine (NORVASC) 5 MG tablet Take 1 tablet (5 mg total) by mouth daily. 90 tablet 1  . busPIRone (BUSPAR) 30 MG tablet Take 1 tablet (30 mg total) by mouth 2 (two) times daily. 180 tablet 0  . darolutamide (NUBEQA) 300 MG tablet Take 600 mg by mouth daily.    [name] dexamethasone (DECADRON) 1 MG tablet Take 1 mg by mouth every morning.    . furosemide (LASIX) 20 MG tablet Take 20 mg by mouth in the morning.    . hydrOXYzine (ATARAX/VISTARIL) 25 MG tablet TAKE 1-2 TABLETS BY MOUTH DAILY AS NEEDED FOR ITCHING (Patient taking differently: Take 25 mg by mouth in the morning and at bedtime.) 60 tablet 2  . labetalol (NORMODYNE) 300 MG tablet Take 1 tablet (300 mg total) by mouth 2 (two) times daily. (Patient taking differently: Take 300 mg by mouth daily in the afternoon.) 180 tablet 1  . Leuprolide Acetate, 3 Month, (LUPRON DEPOT, 60-MONTH, IM) Inject into the muscle.    . lisinopril (ZESTRIL) 20 MG tablet Take 20 mg by mouth 2 (two) times daily.    [name] PARoxetine (PAXIL) 40 MG tablet TAKE ONE TABLET BY MOUTH EVERY DAY 90 tablet 2  . pravastatin (PRAVACHOL) 10 MG tablet TAKE 1 TABLET BY MOUTH AT BEDTIME 90 tablet 1  . triamcinolone (KENALOG) 0.1 % Apply 1 application topically 3 (three) times daily as needed.    . carvedilol (COREG) 25 MG tablet Take 25 mg by mouth 2 (two) times daily.    [name] apixaban (ELIQUIS) 5 MG TABS tablet Take 1 tablet (5 mg total) by mouth 2 (two) times daily. 60 tablet 3   No facility-administered medications prior to visit.    Allergies  Allergen Reactions  . Beef-Derived Products Diarrhea  . Clonazepam Other (See Comments)    High doses cause drunk feeling (>1 mg)  . Monosodium Glutamate Other (See Comments)    MSG (soups and oriental foods)  . Pegademase Bovine Diarrhea    Review of Systems  Constitutional: Negative for chills, fatigue and fever.  HENT: Negative for congestion, ear pain and sore throat.   Eyes: Negative for pain.  Respiratory: Negative for apnea,  cough, chest tightness and shortness of breath.   Cardiovascular: Negative for chest pain and palpitations.  Gastrointestinal: Negative for abdominal pain, constipation, diarrhea, nausea and vomiting.  Endocrine: Negative for polydipsia, polyphagia and polyuria.  Genitourinary: Negative for dysuria.  Musculoskeletal: Negative for arthralgias, back pain and myalgias.  Skin: Negative for rash.  Neurological: Negative for dizziness, weakness and headaches.       Objective:  Physical Exam Constitutional:      Appearance: Normal appearance.  Cardiovascular:     Rate and Rhythm: Normal rate and regular rhythm.     Heart sounds: Normal heart sounds.  Pulmonary:     Effort: Pulmonary effort is normal.     Breath sounds: Normal breath sounds.  Neurological:     Mental Status: He is alert.     BP 126/70   Pulse 62   Temp (!) 97.5 F (36.4 C)   Resp 17   Ht 5\' 10"  (1.778 m)   Wt 190 lb (86.2 kg)   SpO2 97%   BMI 27.26 kg/m  Wt Readings from Last 3 Encounters:  12/04/20 190 lb (86.2 kg)  11/27/20 192 lb 9.6 oz (87.4 kg)  11/20/20 190 lb 14.4 oz (86.6 kg)    Health Maintenance Due  Topic Date Due  . TETANUS/TDAP  12/12/2019  . COVID-19 Vaccine (2 - Booster for Janssen series) 05/07/2020    There are no preventive care reminders to display for this patient.   No results found for: TSH Lab Results  Component Value Date   WBC 9.8 11/19/2020   HGB 14.1 11/19/2020   HCT 41 11/19/2020   MCV 91 11/19/2020   PLT 257 11/19/2020   Lab Results  Component Value Date   NA 138 11/19/2020   K 3.9 11/19/2020   CO2 26 (A) 11/19/2020   GLUCOSE 86 10/23/2020   BUN 27 (A) 11/19/2020   CREATININE 1.1 11/19/2020   BILITOT 0.8 10/23/2020   ALKPHOS 88 11/19/2020   AST 27 11/19/2020   ALT 22 11/19/2020   PROT 6.4 10/23/2020   ALBUMIN 4.1 11/19/2020   CALCIUM 9.8 11/19/2020   ANIONGAP 9 01/09/2019   Lab Results  Component Value Date   CHOL 165 10/23/2020   Lab Results   Component Value Date   HDL 60 10/23/2020   Lab Results  Component Value Date   LDLCALC 91 10/23/2020   Lab Results  Component Value Date   TRIG 73 10/23/2020   Lab Results  Component Value Date   CHOLHDL 2.8 10/23/2020   Lab Results  Component Value Date   HGBA1C 5.6 12/01/2017       Assessment & Plan:  1. Hypertensive heart disease with chronic diastolic congestive heart failure ([place])  Patient was called after his departure when I noted he was not on correct medicines. He had again adjusted his own medicines and was on carvedilol 25 mg one twice a day and labetolol 300 mg once daily in am.   Pt was instructed to stop carvedilol and start on labetolol 300 mg one twice a day.  If his bp is uncontrolled, pt was instructed to call and we would change his medication. Likely I would increase labetolol to 400 mg twice daily.   I recommended he make an appt for his booster (moderna or Estate manager/land agent)  He had [place] first.    Follow-up: Return in about 3 months (around 03/04/2021) for fasting.  An After Visit Summary was printed and given to the patient.  Rochel Brome, MD [facility] [phone]

## 2020-12-10 ENCOUNTER — Telehealth: Payer: Self-pay | Admitting: Oncology

## 2020-12-10 NOTE — Telephone Encounter (Signed)
I printed pt's last labs, including his PSA @ pt's request. Britta Mccreedy handed them to pt.

## 2020-12-10 NOTE — Telephone Encounter (Signed)
Patient came by to get a copy of his Last 11/20/20 PSA Results.  Amy printed them out for him

## 2020-12-12 ENCOUNTER — Telehealth: Payer: Self-pay

## 2020-12-12 NOTE — Telephone Encounter (Signed)
Marcus King was notifed that he should not be no coreg and labetalol.  He was instructed to take the labetalol 300 mg twice daily and stop the carvedilol.  He agreed to do so.  He was encouraged to call us back if he has any questions.

## 2020-12-15 ENCOUNTER — Encounter: Payer: Self-pay | Admitting: Family Medicine

## 2020-12-16 ENCOUNTER — Ambulatory Visit: Payer: Medicare Other | Admitting: Cardiology

## 2020-12-17 ENCOUNTER — Other Ambulatory Visit: Payer: Self-pay

## 2020-12-17 ENCOUNTER — Encounter: Payer: Self-pay | Admitting: Cardiology

## 2020-12-17 ENCOUNTER — Ambulatory Visit (INDEPENDENT_AMBULATORY_CARE_PROVIDER_SITE_OTHER): Payer: Medicare Other | Admitting: Cardiology

## 2020-12-17 VITALS — BP 106/58 | HR 61 | Ht 70.0 in | Wt 186.0 lb

## 2020-12-17 DIAGNOSIS — R5383 Other fatigue: Secondary | ICD-10-CM | POA: Diagnosis not present

## 2020-12-17 DIAGNOSIS — I951 Orthostatic hypotension: Secondary | ICD-10-CM

## 2020-12-17 DIAGNOSIS — I10 Essential (primary) hypertension: Secondary | ICD-10-CM

## 2020-12-17 DIAGNOSIS — R0609 Other forms of dyspnea: Secondary | ICD-10-CM

## 2020-12-17 DIAGNOSIS — Z86711 Personal history of pulmonary embolism: Secondary | ICD-10-CM | POA: Diagnosis not present

## 2020-12-17 DIAGNOSIS — R0600 Dyspnea, unspecified: Secondary | ICD-10-CM

## 2020-12-17 MED ORDER — LISINOPRIL 20 MG PO TABS: 20.0000 mg | ORAL_TABLET | Freq: Every day | ORAL | 1 refills | Status: DC
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
## 2020-12-17 NOTE — Patient Instructions (Signed)
Medication Instructions:  Your physician has recommended you make the following change in your medication:   Decrease: lisinopril to once daily   *If you need a refill on your cardiac medications before your next appointment, please call your pharmacy*   Lab Work: None If you have labs (blood work) drawn today and your tests are completely normal, you will receive your results only by: [name] MyChart Message (if you have MyChart) OR . A paper copy in the mail If you have any lab test that is abnormal or we need to change your treatment, we will call you to review the results.   Testing/Procedures: None   Follow-Up: At [facility], you and your health needs are our priority.  As part of our continuing mission to provide you with exceptional heart care, we have created designated Provider Care Teams.  These Care Teams include your primary Cardiologist (physician) and Advanced Practice Providers (APPs -  Physician Assistants and Nurse Practitioners) who all work together to provide you with the care you need, when you need it.  We recommend signing up for the patient portal called "MyChart".  Sign up information is provided on this After Visit Summary.  MyChart is used to connect with patients for Virtual Visits (Telemedicine).  Patients are able to view lab/test results, encounter notes, upcoming appointments, etc.  Non-urgent messages can be sent to your provider as well.   To learn more about what you can do with MyChart, go to [email or web address].    Your next appointment:   4 month(s)  The format for your next appointment:   In Person  Provider:   Gypsy Balsam, MD   Other Instructions

## 2020-12-17 NOTE — Addendum Note (Signed)
Addended by: Hazle Quant on: 12/17/2020 11:18 AM   Modules accepted: Orders

## 2020-12-23 ENCOUNTER — Other Ambulatory Visit: Payer: Self-pay

## 2020-12-23 ENCOUNTER — Telehealth: Payer: Self-pay

## 2020-12-23 ENCOUNTER — Ambulatory Visit: Payer: Medicare Other

## 2020-12-23 ENCOUNTER — Other Ambulatory Visit: Payer: Self-pay | Admitting: Pharmacist

## 2020-12-23 ENCOUNTER — Inpatient Hospital Stay: Payer: Medicare Other | Attending: Oncology

## 2020-12-23 VITALS — BP 148/74 | HR 55 | Temp 97.8°F | Resp 18 | Ht 70.0 in | Wt 194.8 lb

## 2020-12-23 DIAGNOSIS — C61 Malignant neoplasm of prostate: Secondary | ICD-10-CM | POA: Diagnosis not present

## 2020-12-23 DIAGNOSIS — C774 Secondary and unspecified malignant neoplasm of inguinal and lower limb lymph nodes: Secondary | ICD-10-CM | POA: Insufficient documentation

## 2020-12-23 MED ORDER — LEUPROLIDE ACETATE (3 MONTH) 22.5 MG IM KIT
PACK | INTRAMUSCULAR | Status: AC
  Filled 2020-12-23: qty 22.5

## 2020-12-23 MED ORDER — LEUPROLIDE ACETATE (3 MONTH) 22.5 MG IM KIT
22.5000 mg | PACK | Freq: Once | INTRAMUSCULAR | Status: AC
  Administered 2020-12-23: 22.5 mg via INTRAMUSCULAR

## 2020-12-23 NOTE — Telephone Encounter (Signed)
[facility] called to report that Marcus King's copay for his eliquis this month will be $480.  [name] declined the RX and wanted to see if there was something cheaper.  He is going to pick-up samples until you can review his chart.

## 2020-12-23 NOTE — Telephone Encounter (Signed)
I would recommend he remain on eliquis. His cost should drop after he meets his deductible and I would also like to do a referral to our pharmacist, Emeterio Reeve. Perhaps she can help. His cancer puts him at increased risk for recurrent pulmonary embolism giving him an approximate 10% risk of recurrent pulmonary embolism in the first year after discontinuation of eliquis. This is very high. He could also discuss this with his oncologist at his next visit. Dr. Tobie Poet

## 2020-12-23 NOTE — Patient Instructions (Signed)
Leuprolide injection What is this medicine? LEUPROLIDE (loo PROE lide) is a man-made hormone. It is used to treat the symptoms of prostate cancer. This medicine may also be used to treat children with early onset of puberty. It may be used for other hormonal conditions. This medicine may be used for other purposes; ask your health care provider or pharmacist if you have questions. COMMON BRAND NAME(S): Lupron What should I tell my health care provider before I take this medicine? They need to know if you have any of these conditions:  diabetes  heart disease or previous heart attack  high blood pressure  high cholesterol  pain or difficulty passing urine  spinal cord metastasis  stroke  tobacco smoker  an unusual or allergic reaction to leuprolide, benzyl alcohol, other medicines, foods, dyes, or preservatives  pregnant or trying to get pregnant  breast-feeding How should I use this medicine? This medicine is for injection under the skin or into a muscle. You will be taught how to prepare and give this medicine. Use exactly as directed. Take your medicine at regular intervals. Do not take your medicine more often than directed. It is important that you put your used needles and syringes in a special sharps container. Do not put them in a trash can. If you do not have a sharps container, call your pharmacist or healthcare provider to get one. A special MedGuide will be given to you by the pharmacist with each prescription and refill. Be sure to read this information carefully each time. Talk to your pediatrician regarding the use of this medicine in children. While this medicine may be prescribed for children as young as 8 years for selected conditions, precautions do apply. Overdosage: If you think you have taken too much of this medicine contact a poison control center or emergency room at once. NOTE: This medicine is only for you. Do not share this medicine with others. What if  I miss a dose? If you miss a dose, take it as soon as you can. If it is almost time for your next dose, take only that dose. Do not take double or extra doses. What may interact with this medicine? Do not take this medicine with any of the following medications:  chasteberry  cisapride  dronedarone  pimozide  thioridazine This medicine may also interact with the following medications:  herbal or dietary supplements, like black cohosh or DHEA  male hormones, like estrogens or progestins and birth control pills, patches, rings, or injections  male hormones, like testosterone  other medicines that prolong the QT interval (abnormal heart rhythm) This list may not describe all possible interactions. Give your health care provider a list of all the medicines, herbs, non-prescription drugs, or dietary supplements you use. Also tell them if you smoke, drink alcohol, or use illegal drugs. Some items may interact with your medicine. What should I watch for while using this medicine? Visit your doctor or health care professional for regular checks on your progress. During the first week, your symptoms may get worse, but then will improve as you continue your treatment. You may get hot flashes, increased bone pain, increased difficulty passing urine, or an aggravation of nerve symptoms. Discuss these effects with your doctor or health care professional, some of them may improve with continued use of this medicine. Male patients may experience a menstrual cycle or spotting during the first 2 months of therapy with this medicine. If this continues, contact your doctor or health care professional.   This medicine may increase blood sugar. Ask your healthcare provider if changes in diet or medicines are needed if you have diabetes. What side effects may I notice from receiving this medicine? Side effects that you should report to your doctor or health care professional as soon as possible:  allergic  reactions like skin rash, itching or hives, swelling of the face, lips, or tongue  breathing problems  chest pain  depression or memory disorders  pain in your legs or groin  pain at site where injected  severe headache  signs and symptoms of high blood sugar such as being more thirsty or hungry or having to urinate more than normal. You may also feel very tired or have blurry vision  swelling of the feet and legs  visual changes  vomiting Side effects that usually do not require medical attention (report to your doctor or health care professional if they continue or are bothersome):  breast swelling or tenderness  decrease in sex drive or performance  diarrhea  hot flashes  loss of appetite  muscle, joint, or bone pains  nausea  redness or irritation at site where injected  skin problems or acne This list may not describe all possible side effects. Call your doctor for medical advice about side effects. You may report side effects to FDA at 1-800-FDA-1088. Where should I keep my medicine? Keep out of the reach of children. Store below 25 degrees C (77 degrees F). Do not freeze. Protect from light. Do not use if it is not clear or if there are particles present. Throw away any unused medicine after the expiration date. NOTE: This sheet is a summary. It may not cover all possible information. If you have questions about this medicine, talk to your doctor, pharmacist, or health care provider.  2021 Elsevier/Gold Standard (2019-11-01 10:57:41)  

## 2020-12-24 DIAGNOSIS — L820 Inflamed seborrheic keratosis: Secondary | ICD-10-CM | POA: Diagnosis not present

## 2020-12-24 DIAGNOSIS — L570 Actinic keratosis: Secondary | ICD-10-CM | POA: Diagnosis not present

## 2020-12-24 DIAGNOSIS — D485 Neoplasm of uncertain behavior of skin: Secondary | ICD-10-CM | POA: Diagnosis not present

## 2020-12-25 ENCOUNTER — Other Ambulatory Visit: Payer: Self-pay | Admitting: Family Medicine

## 2020-12-25 NOTE — Telephone Encounter (Signed)
Discussed Dr. Alyse Low recommendations with [name].  He did pick-up his samples of Eliquis.  Will send referral to [facility].

## 2020-12-26 ENCOUNTER — Ambulatory Visit: Payer: Medicare Other

## 2020-12-26 ENCOUNTER — Other Ambulatory Visit: Payer: Self-pay | Admitting: Family Medicine

## 2020-12-26 DIAGNOSIS — F419 Anxiety disorder, unspecified: Secondary | ICD-10-CM

## 2021-01-06 ENCOUNTER — Telehealth: Payer: Self-pay

## 2021-01-06 NOTE — Telephone Encounter (Signed)
Mrs. Fults called to report that Marcus King is feeling very poorly this afternoon.  He is very lethargic and weak.  His bp is 141/73, pulse 71.  Dr. Tobie Poet advised that he go immediately to the ED for evaluation and treatment.

## 2021-01-07 ENCOUNTER — Telehealth (INDEPENDENT_AMBULATORY_CARE_PROVIDER_SITE_OTHER): Payer: Medicare Other | Admitting: Nurse Practitioner

## 2021-01-07 ENCOUNTER — Encounter: Payer: Self-pay | Admitting: Nurse Practitioner

## 2021-01-07 VITALS — BP 110/60 | HR 78 | Temp 96.7°F | Ht 70.0 in | Wt 186.0 lb

## 2021-01-07 DIAGNOSIS — R0609 Other forms of dyspnea: Secondary | ICD-10-CM

## 2021-01-07 DIAGNOSIS — R0600 Dyspnea, unspecified: Secondary | ICD-10-CM

## 2021-01-07 DIAGNOSIS — R5383 Other fatigue: Secondary | ICD-10-CM

## 2021-01-07 LAB — POC COVID19 BINAXNOW: SARS Coronavirus 2 Ag: NEGATIVE

## 2021-01-07 NOTE — Progress Notes (Signed)
Virtual Visit via Telephone Note   This visit type was conducted due to national recommendations for restrictions regarding the COVID-19 Pandemic (e.g. social distancing) in an effort to limit this patient's exposure and mitigate transmission in our community.  Due to his co-morbid illnesses, this patient is at least at moderate risk for complications without adequate follow up.  This format is felt to be most appropriate for this patient at this time.  The patient did not have access to video technology/had technical difficulties with video requiring transitioning to audio format only (telephone).  All issues noted in this document were discussed and addressed.  No physical exam could be performed with this format.  Patient verbally consented to a telehealth visit.   Date:  01/07/2021   ID:  Marcus King, DOB [birth year], MRN [id number]  Patient Location: Home Provider Location: Office/Clinic  PCP:  Blane Ohara, MD   Evaluation Performed:  Established patient, acute telemedicine visit  Chief Complaint:  Lethargy  History of Present Illness:    Marcus King is a 80 y.o. male with increased lethargy, diarrhea, generalized weakness, and dyspnea with activity. He denies decreased appetite, palpitations, swelling in lower extremities, or changes to medications. He states he lives an active lifestyle and the fatigue he is experiencing is affecting his quality of life. new. Onset of symptoms was 2-weeks ago. He denies treatment of symptoms other than rest. He denies exposure to known sick contacts. Past medical history include heart failure, hypertension, hyperlipidemia, chronic allergic rhinitis, and prostate cancer. He has agreed to rapid COVID-19 antigen test today.  The patient does have symptoms concerning for COVID-19 infection (fever, chills, cough, or new shortness of breath).    Past Medical History:  Diagnosis Date  . Anxiety   . Cervical disc disorder with myelopathy,  cervicothoracic region   . Chronic pain of left knee 08/11/2016  . 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 Congestive heart failure with LV diastolic dysfunction, NYHA class 1 (HCC) 09/21/2018  . Dyslipidemia 08/11/2017  . Dyspnea on exertion 04/23/2020  . Edema extremities 04/14/2018  . Elevated glucose 10/17/2019  . Fatigue 04/23/2020  . Food poisoning   . GAD (generalized anxiety disorder)   . Headache    sinus  . History of pulmonary embolism 06/13/2020  . History of pulmonary embolus (PE) 06/13/2020  . Hypertension   . Hypertensive heart disease 08/05/2017  . Hypoxia 10/17/2019  . Inguinal lymphadenopathy 05/26/2020  . LVH (left ventricular hypertrophy) due to hypertensive disease 08/11/2017  . Malignant neoplasm of prostate (HCC) 09/09/2020  . Mixed hyperlipidemia   . Orthostatic hypotension   . Orthostatic syncope 10/17/2019  . OSA on CPAP   . Prostate cancer (HCC)   . Pulmonary embolism (HCC) 04/2020  . PVC's (premature ventricular contractions) 05/18/2018  . Rectal mass 05/24/2020  . 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 Solid nodule of lung greater than 8 mm in diameter 05/26/2020    Past Surgical History:  Procedure Laterality Date  . CARDIAC CATHETERIZATION    . CATARACT EXTRACTION W/ INTRAOCULAR LENS  IMPLANT, BILATERAL Bilateral   . COLON SURGERY  2014  . COLOSTOMY REVERSAL  2014  . HERNIA REPAIR     x2  . JOINT REPLACEMENT    . PROSTATE SURGERY  2006  . SEPTOPLASTY    . TOTAL KNEE ARTHROPLASTY Left 09/28/2016   Procedure: TOTAL KNEE ARTHROPLASTY;  Surgeon: Dannielle Huh, MD;  Location: [facility] OR;  Service: Orthopedics;  Laterality: Left;    Family History  Problem Relation Age of Onset  . Stroke Mother   . Parkinson's disease Mother   . Lung disease Father   . Stroke Father   . Alcoholism Father   . Leukemia Sister   . Muscular dystrophy Son        Immunologist  . Alzheimer's disease Neg Hx     Social History   Socioeconomic History  .  Marital status: Married    Spouse name: Not on file  . Number of children: 2  . Years of education: Not on file  . Highest education level: High school graduate  Occupational History  . Occupation: Engineer, petroleum    Comment: ICDs/Wound Care  Tobacco Use  . Smoking status: Former Smoker    Years: 20.00    Types: Cigarettes    Quit date: 1979    Years since quitting: 43.0  . Smokeless tobacco: Never Used  . Tobacco comment: quit 37 years ago  Vaping Use  . Vaping Use: Never used  Substance and Sexual Activity  . Alcohol use: No  . Drug use: No  . Sexual activity: Not on file  Other Topics Concern  . Not on file  Social History Narrative   LIves at home with his wife   Self employed   Right handed   Drinks about 2 cups of caffeine daily   Social Determinants of Health   Financial Resource Strain: Not on file  Food Insecurity: No Food Insecurity  . Worried About Charity fundraiser in the Last Year: Never true  . Ran Out of Food in the Last Year: Never true  Transportation Needs: Not on file  Physical Activity: Not on file  Stress: Not on file  Social Connections: Not on file  Intimate Partner Violence: Not on file    Outpatient Medications Prior to Visit  Medication Sig Dispense Refill  . amLODipine (NORVASC) 5 MG tablet Take 1 tablet (5 mg total) by mouth daily. 90 tablet 1  . apixaban (ELIQUIS) 5 MG TABS tablet Take 5 mg by mouth daily.    . busPIRone (BUSPAR) 30 MG tablet Take 1 tablet (30 mg total) by mouth 2 (two) times daily. 60 tablet 3  . darolutamide (NUBEQA) 300 MG tablet Take 600 mg by mouth daily. Monday's, Wednesday's and Fridays    . furosemide (LASIX) 20 MG tablet take 1 tablet (20 mg) by oral route once daily 90 tablet 1  . hydrOXYzine (ATARAX/VISTARIL) 25 MG tablet TAKE 1-2 TABLETS BY MOUTH DAILY AS NEEDED FOR ITCHING (Patient taking differently: Take 25 mg by mouth in the morning and at bedtime.) 60 tablet 2  . labetalol (NORMODYNE) 300 MG tablet  Take 1 tablet (300 mg total) by mouth 2 (two) times daily. (Patient taking differently: Take 300 mg by mouth daily in the afternoon.) 180 tablet 1  . Leuprolide Acetate, 3 Month, (LUPRON DEPOT, 55-MONTH, IM) Inject into the muscle.    . lisinopril (ZESTRIL) 20 MG tablet Take 1 tablet (20 mg total) by mouth daily. 90 tablet 1  . PARoxetine (PAXIL) 40 MG tablet TAKE ONE TABLET BY MOUTH EVERY DAY 90 tablet 2  . pravastatin (PRAVACHOL) 10 MG tablet TAKE 1 TABLET BY MOUTH AT BEDTIME 90 tablet 1   No facility-administered medications prior to visit.    Allergies:   Beef-derived products, Clonazepam, Monosodium glutamate, and Pegademase bovine   Social History   Tobacco Use  . Smoking status: Former Smoker  Years: 20.00    Types: Cigarettes    Quit date: 32    Years since quitting: 43.0  . Smokeless tobacco: Never Used  . Tobacco comment: quit 37 years ago  Vaping Use  . Vaping Use: Never used  Substance Use Topics  . Alcohol use: No  . Drug use: No     Review of Systems  Constitutional: Positive for malaise/fatigue. Negative for chills and fever.  HENT: Positive for congestion (Nasal drainage). Negative for ear pain, sinus pain and sore throat.   Respiratory: Positive for shortness of breath. Negative for cough, sputum production and wheezing.   Cardiovascular: Negative for chest pain.  Gastrointestinal: Positive for diarrhea (Chronic). Negative for abdominal pain, constipation, nausea and vomiting.  Genitourinary: Negative for dysuria, frequency and urgency.  Musculoskeletal: Negative for joint pain and myalgias.  Neurological: Negative for dizziness and headaches.     Labs/Other Tests and Data Reviewed:    Recent Labs: 11/19/2020: ALT 22; BUN 27; Creatinine 1.1; Hemoglobin 14.1; Platelets 257; Potassium 3.9; Sodium 138   Recent Lipid Panel Lab Results  Component Value Date/Time   CHOL 165 10/23/2020 10:33 AM   TRIG 73 10/23/2020 10:33 AM   HDL 60 10/23/2020 10:33 AM    CHOLHDL 2.8 10/23/2020 10:33 AM   LDLCALC 91 10/23/2020 10:33 AM    Wt Readings from Last 3 Encounters:  01/07/21 186 lb (84.4 kg)  12/23/20 194 lb 12 oz (88.3 kg)  12/17/20 186 lb (84.4 kg)     Objective:    Vital Signs:  BP 110/60   Pulse 78   Temp (!) 96.7 F (35.9 C)   Ht 5\' 10"  (1.778 m)   Wt 186 lb (84.4 kg)   SpO2 93%   BMI 26.69 kg/m    Physical Exam No physical exam due to telemedicine visit  ASSESSMENT & PLAN:    1. Lethargy - POC COVID-19-Negative - Novel Coronavirus, NAA ([facility])-Negative  2. Dyspnea on exertion - CBC with Differential/Platelet - Comprehensive metabolic panel - TSH - Brain natriuretic peptide     COVID-19 Education: The signs and symptoms of COVID-19 were discussed with the patient and how to seek care for testing (follow up with PCP or arrange E-visit). The importance of social distancing was discussed today.   I spent 15 minutes dedicated to the care of this patient on the date of this encounter to include telephone time with the patient, as well as:EMR review.  Follow Up:  Virtual Visit  prn  Signed,  Jerrell Belfast, DNP  01/12/2021 3:44 PM    [place]

## 2021-01-07 NOTE — Patient Instructions (Addendum)
We will call you with lab results Rest when you feel short of breath  Shortness of Breath, Adult Shortness of breath means you have trouble breathing. Shortness of breath could be a sign of a medical problem. Follow these instructions at home:  Watch for any changes in your symptoms.  Do not use any products that contain nicotine or tobacco, such as cigarettes, e-cigarettes, and chewing tobacco.  Do not smoke. Smoking can cause shortness of breath. If you need help to quit smoking, ask your doctor.  Avoid things that can make it harder to breathe, such as: ? Mold. ? Dust. ? Air pollution. ? Chemical smells. ? Things that can cause allergy symptoms (allergens), if you have allergies.  Keep your living space clean. Use products that help remove mold and dust.  Rest as needed. Slowly return to your normal activities.  Take over-the-counter and prescription medicines only as told by your doctor. This includes oxygen therapy and inhaled medicines.  Keep all follow-up visits as told by your doctor. This is important.   Contact a doctor if:  Your condition does not get better as soon as expected.  You have a hard time doing your normal activities, even after you rest.  You have new symptoms. Get help right away if:  Your shortness of breath gets worse.  You have trouble breathing when you are resting.  You feel light-headed or you pass out (faint).  You have a cough that is not helped by medicines.  You cough up blood.  You have pain with breathing.  You have pain in your chest, arms, shoulders, or belly (abdomen).  You have a fever.  You cannot walk up stairs.  You cannot exercise the way you normally do. These symptoms may represent a serious problem that is an emergency. Do not wait to see if the symptoms will go away. Get medical help right away. Call your local emergency services (911 in the [place] Do not drive yourself to the hospital. Summary  Shortness of  breath is when you have trouble breathing enough air. It can be a sign of a medical problem.  Avoid things that make it hard for you to breathe, such as smoking, pollution, mold, and dust.  Watch for any changes in your symptoms. Contact your doctor if you do not get better or you get worse. This information is not intended to replace advice given to you by your health care provider. Make sure you discuss any questions you have with your health care provider. Document Revised: 05/02/2018 Document Reviewed: 05/02/2018 Elsevier Patient Education  2021 [place].  Heart Failure, Diagnosis  Heart failure means that your heart is not able to pump blood in the right way. This makes it hard for your body to work well. Heart failure is usually a long-term (chronic) condition. You must take good care of yourself and follow your treatment plan from your doctor. What are the causes?  High blood pressure.  Buildup of cholesterol and fat in the arteries.  Heart attack. This injures the heart muscle.  Heart valves that do not open and close properly.  Damage of the heart muscle. This is also called cardiomyopathy.  Infection of the heart muscle. This is also called myocarditis.  Lung disease. What increases the risk?  Getting older. The risk of heart failure goes up as a person ages.  Being overweight.  Being male.  Use tobacco or nicotine products.  Abusing alcohol or drugs.  Having taken medicines that  can damage the heart.  Having any of these conditions: ? Diabetes. ? Abnormal heart rhythms. ? Thyroid problems. ? Low blood counts (anemia).  Having a family history of heart failure. What are the signs or symptoms?  Shortness of breath.  Coughing.  Swelling of the feet, ankles, legs, or belly.  Losing or gaining weight for no reason.  Trouble breathing.  Waking from sleep because of the need to sit up and get more air.  Fast heartbeat.  Being very  tired.  Feeling dizzy, or feeling like you may pass out (faint).  Having no desire to eat.  Feeling like you may vomit (nauseous).  Peeing (urinating) more at night.  Feeling confused. How is this treated? This condition may be treated with:  Medicines. These can be given to treat blood pressure and to make the heart muscles stronger.  Changes in your daily life. These may include: ? Eating a healthy diet. ? Staying at a healthy body weight. ? Quitting tobacco, alcohol, and drug use. ? Doing exercises. ? Participating in a cardiac rehabilitation program. This program helps you improve your health through exercise, education, and counseling.  Surgery. Surgery can be done to open blocked valves, or to put devices in the heart, such as pacemakers.  A donor heart (heart transplant). You will receive a healthy heart from a donor. Follow these instructions at home:  Treat other conditions as told by your doctor. These may include high blood pressure, diabetes, thyroid disease, or abnormal heart rhythms.  Learn as much as you can about heart failure.  Get support as you need it.  Keep all follow-up visits. Summary  Heart failure means that your heart is not able to pump blood in the right way.  This condition is often caused by high blood pressure, heart attack, or damage of the heart muscle.  Symptoms of this condition include shortness of breath and swelling of the feet, ankles, legs, or belly. You may also feel very tired or feel like you may vomit.  You may be treated with medicines, surgery, or changes in your daily life.  Treat other health conditions as told by your doctor. This information is not intended to replace advice given to you by your health care provider. Make sure you discuss any questions you have with your health care provider. Document Revised: 06/22/2020 Document Reviewed: 06/22/2020 Elsevier Patient Education  [place].  Heart Failure  Eating Plan Heart failure, also called congestive heart failure, occurs when your heart does not pump blood well enough to meet your body's needs for oxygen-rich blood. Heart failure is a long-term (chronic) condition. Living with heart failure can be challenging. Following your health care provider's instructions about a healthy lifestyle and working with a dietitian to choose the right foods may help to improve your symptoms. An eating plan for someone with heart failure will include changes that limit the intake of salt (sodium) and unhealthy fat. What are tips for following this plan? Reading food labels Check food labels for the amount of sodium per serving. Choose foods that have less than 140 mg (milligrams) of sodium in each serving. Check food labels for the number of calories per serving. This is important if you need to limit your daily calorie intake to lose weight. Check food labels for the serving size. If you eat more than one serving, you will be eating more sodium and calories than what is listed on the label. Look for foods that are labeled as "  sodium-free," "very low sodium," or "low sodium." Foods labeled as "reduced sodium" or "lightly salted" may still have more sodium than what is recommended for you. Cooking Avoid adding salt when cooking. Ask your health care provider or dietitian before using salt substitutes. Season food with salt-free seasonings, spices, or herbs. Check the label of seasoning mixes to make sure they do not contain salt. Cook with heart-healthy oils, such as olive, canola, soybean, or sunflower oil. Do not fry foods. Cook foods using low-fat methods, such as baking, boiling, grilling, and broiling. Limit unhealthy fats when cooking by: Removing the skin from poultry, such as chicken. Removing all visible fats from meats. Skimming the fat off from stews, soups, and gravies before serving them. Meal planning Limit your intake of: Processed, canned, or  prepackaged foods. Foods that are high in trans fat, such as fried foods. Sweets, desserts, sugary drinks, and other foods with added sugar. Full-fat dairy products, such as whole milk. Eat a balanced diet. This may include: 4-5 servings of fruit each day and 4-5 servings of vegetables each day. At each meal, try to fill one-half of your plate with fruits and vegetables. Up to 6-8 servings of whole grains each day. Up to 2 servings of lean meat, poultry, or fish each day. One serving of meat is equal to 3 oz (85 g). This is about the same size as a deck of cards. 2 servings of low-fat dairy each day. Heart-healthy fats. Healthy fats called omega-3 fatty acids are found in foods such as flaxseed and cold-water fish like sardines, salmon, and mackerel. Aim to eat 25-35 g (grams) of fiber a day. Foods that are high in fiber include apples, broccoli, carrots, beans, peas, and whole grains. Do not add salt or condiments that contain salt (such as soy sauce) to foods before eating. When eating at a restaurant, ask that your food be prepared with less salt or no salt, if possible. Try to eat 2 or more vegetarian meals each week. Eat more home-cooked food and eat less restaurant, buffet, and fast food.   General information Do not eat more than 2,300 mg of sodium a day. The amount of sodium that is recommended for you may be lower, depending on your condition. Maintain a healthy body weight as directed. Ask your health care provider what a healthy weight is for you. Check your weight every day. Work with your health care provider and dietitian to make a plan that is right for you to lose weight or maintain your current weight. Limit how much fluid you drink. Ask your health care provider or dietitian how much fluid you can have each day. Limit or avoid alcohol as told by your health care provider or dietitian. Recommended foods Fruits All fresh, frozen, and canned fruits. Dried fruits, such as  raisins, prunes, and cranberries. Vegetables All fresh vegetables. Vegetables that are frozen without sauce or added salt. Low-sodium or sodium-free canned vegetables. Grains Bread with less than 80 mg of sodium per slice. Whole-wheat pasta, quinoa, and brown rice. Oats and oatmeal. Barley. [place]. Grits and cream of wheat. Whole-grain and whole-wheat cold cereal. Meats and other protein foods Lean cuts of meat. Skinless chicken and [place]. Fish with high omega-3 fatty acids, such as salmon, sardines, and other cold-water fishes. Eggs. Dried beans, peas, and edamame. Unsalted nuts and nut butters. Dairy Low-fat or nonfat (skim) milk and dried milk. Rice milk, soy milk, and almond milk. Low-fat or nonfat yogurt. Small amounts of reduced-sodium block  cheese. Low-sodium cottage cheese. Fats and oils Olive, canola, soybean, flaxseed, avocado, or sunflower oil. Sweets and desserts Applesauce. Granola bars. Sugar-free pudding and gelatin. Frozen fruit bars. Seasoning and other foods Fresh and dried herbs. Lemon or lime juice. Vinegar. Low-sodium ketchup. Salt-free marinades, salad dressings, sauces, and seasonings. The items listed above may not be a complete list of foods and beverages you can eat. Contact a dietitian for more information. Foods to avoid Fruits Fruits that are dried with sodium-containing preservatives. Vegetables Canned vegetables. Frozen vegetables with sauce or seasonings. Creamed vegetables. [place] fries. Onion rings. Pickled vegetables and sauerkraut. Grains Bread with more than 80 mg of sodium per slice. Hot or cold cereal with more than 140 mg sodium per serving. Salted pretzels and crackers. Prepackaged breadcrumbs. Bagels, croissants, and biscuits. Meats and other protein foods Ribs and chicken wings. Bacon, ham, pepperoni, bologna, salami, and packaged luncheon meats. Hot dogs, bratwurst, and sausage. Canned meat. Smoked meat and fish. Salted nuts and  seeds. Dairy Whole milk, half-and-half, and cream. Buttermilk. Processed cheese, cheese spreads, and cheese curds. Regular cottage cheese. Feta cheese. Shredded cheese. String cheese. Fats and oils Butter, lard, shortening, ghee, and bacon fat. Canned and packaged gravies. Seasoning and other foods Onion salt, garlic salt, table salt, and sea salt. Marinades. Regular salad dressings. Relishes, pickles, and olives. Meat flavorings and tenderizers, and bouillon cubes. Horseradish, ketchup, and mustard. Worcestershire sauce. Teriyaki sauce, soy sauce (including reduced sodium). Hot sauce and Tabasco sauce. Steak sauce, fish sauce, oyster sauce, and cocktail sauce. Taco seasonings. Barbecue sauce. Tartar sauce. The items listed above may not be a complete list of foods and beverages you should avoid. Contact a dietitian for more information. Summary A heart failure eating plan includes changes that limit your intake of sodium and unhealthy fat, and it may help you lose weight or maintain a healthy weight. Your health care provider may also recommend limiting how much fluid you drink. Most people with heart failure should eat no more than 2,300 mg of salt (sodium) a day. The amount of sodium that is recommended for you may be lower, depending on your condition. Contact your health care provider or dietitian before making any major changes to your diet. This information is not intended to replace advice given to you by your health care provider. Make sure you discuss any questions you have with your health care provider. Document Revised: 07/15/2020 Document Reviewed: 07/15/2020 Elsevier Patient Education  2021 [place].   Heart Failure and Exercise Heart failure is a condition in which the heart does not fill or pump enough blood and oxygen to support your body and its functions. Heart failure is a long-term (chronic) condition. Living with heart failure can be challenging. However, following your  health care provider's instructions about a healthy lifestyle may help improve your symptoms. This includes choosing the right exercise plan. Doing daily physical activity is important after a diagnosis of heart failure. You may have some activity restrictions, so talk to your health care provider before doing any exercises. What are the benefits of exercise? Exercise may:  Make your heart muscles stronger.  Lower your blood pressure and cholesterol.  Help you lose weight.  Help your bones stay strong.  Improve your blood circulation.  Help your body use oxygen better. This relieves symptoms such as fatigue and shortness of breath.  Help your mental health by lowering the risk of depression and other problems.  Improve your quality of life.  Decrease your chance of  hospital admission for heart failure. What is an exercise plan? An exercise plan is a set of specific exercises and training activities. You will work with your health care provider to create the exercise plan that works for you. The plan may include:  Different types of exercises and how to do them.  Cardiac rehabilitation exercises. These are supervised programs that are designed to strengthen your heart. What are strengthening exercises? Strengthening exercises are a type of physical activity that involves using resistance to improve your muscle strength. Strengthening exercises usually have repetitive motions. These types of exercises can include:  Lifting weights.  Using weight machines.  Using resistance tubes and bands.  Using kettlebells.  Using your body weight, such as doing push-ups or squats. What are balance exercises? Balance exercises are another type of physical activity. They strengthen the muscles of the back, abdomen, and pelvis (core muscles) and improve your balance. They can also lower your risk of falling. These types of exercises can include:  Standing on one leg.  Walking backward,  sideways, and in a straight line.  Standing up after sitting, without using your hands.  Shifting your weight from one leg to the other.  Lifting one leg in front of you.  Doing tai chi. This is a type of exercise that uses slow movements and deep breathing. How can I increase my flexibility? Having better flexibility can keep you from falling. It can also lengthen your muscles, improve your range of motion, and help your joints. You can increase your flexibility by:  Doing tai chi.  Doing yoga.  Stretching.   How much aerobic exercise should I get? Aerobic exercise strengthens your breathing and circulation system and increases your body's use of oxygen. This type of exercise causes your heart to beat faster while you are doing it. Examples of aerobic exercise include biking, walking, running, and swimming. Talk to your health care provider to find out how much aerobic exercise is safe for you. To do this type of exercise:  Start exercising slowly, limiting the amount of time at first. You may need to start with 5 minutes of aerobic exercise every day.  Slowly add more minutes until you can safely do at least 30 minutes of exercise at least 5 days a week.   Summary  Daily physical activity is important after a diagnosis of heart failure.  Exercise can make your heart muscles stronger. It also offers other benefits that will improve your health.  Exercise can decrease your chance of hospital admission for heart failure.  Talk to your health care provider before doing any exercises. This information is not intended to replace advice given to you by your health care provider. Make sure you discuss any questions you have with your health care provider. Document Revised: 07/15/2020 Document Reviewed: 07/15/2020 Elsevier Patient Education  2021 [employer].

## 2021-01-08 LAB — CBC WITH DIFFERENTIAL/PLATELET
Basophils Absolute: 0.1 10*3/uL (ref 0.0–0.2)
Basos: 1 %
EOS (ABSOLUTE): 0.6 10*3/uL — ABNORMAL HIGH (ref 0.0–0.4)
Eos: 7 %
Hematocrit: 42.3 % (ref 37.5–51.0)
Hemoglobin: 14.3 g/dL (ref 13.0–17.7)
Immature Grans (Abs): 0.2 10*3/uL — ABNORMAL HIGH (ref 0.0–0.1)
Immature Granulocytes: 2 %
Lymphocytes Absolute: 1.7 10*3/uL (ref 0.7–3.1)
Lymphs: 19 %
MCH: 30.4 pg (ref 26.6–33.0)
MCHC: 33.8 g/dL (ref 31.5–35.7)
MCV: 90 fL (ref 79–97)
Monocytes Absolute: 1 10*3/uL — ABNORMAL HIGH (ref 0.1–0.9)
Monocytes: 11 %
Neutrophils Absolute: 5.4 10*3/uL (ref 1.4–7.0)
Neutrophils: 60 %
Platelets: 314 10*3/uL (ref 150–450)
RBC: 4.7 x10E6/uL (ref 4.14–5.80)
RDW: 12.9 % (ref 11.6–15.4)
WBC: 9 10*3/uL (ref 3.4–10.8)

## 2021-01-08 LAB — COMPREHENSIVE METABOLIC PANEL
ALT: 17 IU/L (ref 0–44)
AST: 19 IU/L (ref 0–40)
Albumin/Globulin Ratio: 2.1 (ref 1.2–2.2)
Albumin: 4.2 g/dL (ref 3.7–4.7)
Alkaline Phosphatase: 89 IU/L (ref 44–121)
BUN/Creatinine Ratio: 20 (ref 10–24)
BUN: 21 mg/dL (ref 8–27)
Bilirubin Total: <0.2 mg/dL (ref 0.0–1.2)
CO2: 23 mmol/L (ref 20–29)
Calcium: 9.3 mg/dL (ref 8.6–10.2)
Chloride: 104 mmol/L (ref 96–106)
Creatinine, Ser: 1.05 mg/dL (ref 0.76–1.27)
GFR calc Af Amer: 77 mL/min/{1.73_m2} (ref >59)
GFR calc non Af Amer: 67 mL/min/{1.73_m2} (ref >59)
Globulin, Total: 2 g/dL (ref 1.5–4.5)
Glucose: 94 mg/dL (ref 65–99)
Potassium: 4.1 mmol/L (ref 3.5–5.2)
Sodium: 141 mmol/L (ref 134–144)
Total Protein: 6.2 g/dL (ref 6.0–8.5)

## 2021-01-08 LAB — TSH: TSH: 1.3 u[IU]/mL (ref 0.450–4.500)

## 2021-01-08 LAB — NOVEL CORONAVIRUS, NAA: SARS-CoV-2, NAA: NOT DETECTED

## 2021-01-08 LAB — SARS-COV-2, NAA 2 DAY TAT

## 2021-01-08 LAB — BRAIN NATRIURETIC PEPTIDE: BNP: 94.1 pg/mL (ref 0.0–100.0)

## 2021-01-13 NOTE — Progress Notes (Signed)
Patient has been approved for free [employer] through [employer]. 01/11/2021-12/13/2021 Case Number [id number]

## 2021-01-15 ENCOUNTER — Other Ambulatory Visit: Payer: Self-pay | Admitting: Family Medicine

## 2021-01-20 ENCOUNTER — Inpatient Hospital Stay: Payer: Medicare Other | Attending: Oncology

## 2021-01-20 ENCOUNTER — Other Ambulatory Visit: Payer: Self-pay

## 2021-01-20 ENCOUNTER — Other Ambulatory Visit: Payer: Self-pay | Admitting: Hematology and Oncology

## 2021-01-20 DIAGNOSIS — C774 Secondary and unspecified malignant neoplasm of inguinal and lower limb lymph nodes: Secondary | ICD-10-CM | POA: Diagnosis not present

## 2021-01-20 DIAGNOSIS — C61 Malignant neoplasm of prostate: Secondary | ICD-10-CM | POA: Insufficient documentation

## 2021-01-20 DIAGNOSIS — D649 Anemia, unspecified: Secondary | ICD-10-CM | POA: Diagnosis not present

## 2021-01-20 LAB — HEPATIC FUNCTION PANEL
ALT: 18 (ref 10–40)
AST: 30 (ref 14–40)
Alkaline Phosphatase: 91 (ref 25–125)
Bilirubin, Total: 0.7

## 2021-01-20 LAB — BASIC METABOLIC PANEL
BUN: 27 — AB (ref 4–21)
CO2: 25 — AB (ref 13–22)
Chloride: 103 (ref 99–108)
Creatinine: 1.1 (ref 0.6–1.3)
Glucose: 101
Potassium: 4.2 (ref 3.4–5.3)
Sodium: 137 (ref 137–147)

## 2021-01-20 LAB — CBC AND DIFFERENTIAL
HCT: 42 (ref 41–53)
Hemoglobin: 13.8 (ref 13.5–17.5)
Neutrophils Absolute: 5.85
Platelets: 322 (ref 150–399)
WBC: 9

## 2021-01-20 LAB — COMPREHENSIVE METABOLIC PANEL
Albumin: 4.2 (ref 3.5–5.0)
Calcium: 9.3 (ref 8.7–10.7)

## 2021-01-20 LAB — CBC: RBC: 4.56 (ref 3.87–5.11)

## 2021-01-21 ENCOUNTER — Telehealth: Payer: Self-pay | Admitting: Hematology and Oncology

## 2021-01-21 ENCOUNTER — Encounter: Payer: Self-pay | Admitting: Hematology and Oncology

## 2021-01-21 ENCOUNTER — Inpatient Hospital Stay (INDEPENDENT_AMBULATORY_CARE_PROVIDER_SITE_OTHER): Payer: Medicare Other | Admitting: Hematology and Oncology

## 2021-01-21 DIAGNOSIS — R079 Chest pain, unspecified: Secondary | ICD-10-CM | POA: Diagnosis not present

## 2021-01-21 DIAGNOSIS — C61 Malignant neoplasm of prostate: Secondary | ICD-10-CM | POA: Diagnosis not present

## 2021-01-21 DIAGNOSIS — I2699 Other pulmonary embolism without acute cor pulmonale: Secondary | ICD-10-CM | POA: Diagnosis not present

## 2021-01-21 LAB — PROSTATE-SPECIFIC AG, SERUM (LABCORP): Prostate Specific Ag, Serum: 0.2 ng/mL (ref 0.0–4.0)

## 2021-01-21 NOTE — Progress Notes (Signed)
[facility]  [address] [place],  [place]  [address] [phone]  Clinic Day:  01/21/2021  Referring physician: Rochel Brome, MD   CHIEF COMPLAINT:  CC: An 80 year old male with history of prostate cancer who presents to clinic for new onset shortness of breath and itching.  Current Treatment:  Darolutamide 300 mg 3/7 days a week; Lupron injections every 3 months   HISTORY OF PRESENT ILLNESS:  Lakai Moree is a 80 y.o. male with a history of metastatic prostate cancer, which includes spread of disease to his left inguinal lymph node. There was also evidence of disease being studded along his anal canal. He remains on complete androgen deprivation therapy, which consists of Lupron/ Darolutamide. The patient has tolerated this combination fairly well with pruritis as his main issue.    INTERVAL HISTORY:  Jakayden is here today for evaluation regarding his increasing shortness of breath and intense itching of his upper body. The itching has been occurring since last December, but has continued to worsen. It causes him to scratch to the point of bleeding. This was previously attributed to his darolutamide and his dosing was decreased to 4 days per week; however, the patient has only been taking 3 days per week (300 mg). Of note, his PSA is decreased today from 0.6 in December to 0.2. The shortness of breath started approximately 3 weeks ago after receiving his last Lupron injection. He now has difficulty ambulating short distances without becoming very short of breath. He reports mild chest discomfort as well. He denies fever, chills or cough. His hemoglobin is 13.8, slightly decreased from last visit at 14.3. White count and ANC are normal. Chemistries are unremarkable. His appetite is good and he has gained 8 pounds since last visit. He states he was told a long time ago that he has CHF and does take daily furosemide. He denies any increase in edema. He  also has a history of PE in 2021 and states he stopped his Eliquis due to the bleeding from scratching. We will obtain STAT CTA.  REVIEW OF SYSTEMS:  Review of Systems  Constitutional: Positive for fatigue. Negative for appetite change, chills, diaphoresis, fever and unexpected weight change.  HENT:   Negative for hearing loss, lump/mass, mouth sores, nosebleeds, sore throat, tinnitus, trouble swallowing and voice change.   Eyes: Negative for eye problems and icterus.  Respiratory: Positive for shortness of breath. Negative for chest tightness, cough, hemoptysis and wheezing.   Cardiovascular: Positive for chest pain. Negative for leg swelling and palpitations.  Gastrointestinal: Negative for abdominal distention, abdominal pain, blood in stool, constipation, diarrhea, nausea, rectal pain and vomiting.  Endocrine: Negative for hot flashes.  Genitourinary: Negative for bladder incontinence, difficulty urinating, dyspareunia, dysuria, frequency, hematuria and nocturia.   Musculoskeletal: Negative for arthralgias, back pain, flank pain, gait problem, myalgias, neck pain and neck stiffness.  Skin: Positive for itching and rash. Negative for wound.  Neurological: Negative for dizziness, extremity weakness, gait problem, headaches, light-headedness, numbness, seizures and speech difficulty.  Hematological: Negative for adenopathy. Does not bruise/bleed easily.  Psychiatric/Behavioral: Negative for confusion, decreased concentration, depression, sleep disturbance and suicidal ideas. The patient is not nervous/anxious.      VITALS:  Resp. rate 18, height 5\' 10"  (1.778 m), weight 195 lb 3.2 oz (88.5 kg), SpO2 93 %.  Wt Readings from Last 3 Encounters:  01/21/21 195 lb 3.2 oz (88.5 kg)  01/07/21 186 lb (84.4 kg)  12/23/20 194 lb 12 oz (  88.3 kg)    Body mass index is 28.01 kg/m.  Performance status (ECOG): 1 - Symptomatic but completely ambulatory  PHYSICAL EXAM:  Physical Exam Constitutional:       General: He is not in acute distress.    Appearance: Normal appearance. He is normal weight. He is not ill-appearing, toxic-appearing or diaphoretic.  HENT:     Head: Normocephalic and atraumatic.     Right Ear: Tympanic membrane normal.     Left Ear: Tympanic membrane normal.     Nose: Nose normal. No congestion or rhinorrhea.     Mouth/Throat:     Mouth: Mucous membranes are moist.     Pharynx: Oropharynx is clear. No oropharyngeal exudate or posterior oropharyngeal erythema.  Eyes:     General: No scleral icterus.       Right eye: No discharge.        Left eye: No discharge.     Extraocular Movements: Extraocular movements intact.     Conjunctiva/sclera: Conjunctivae normal.     Pupils: Pupils are equal, round, and reactive to light.  Neck:     Vascular: No carotid bruit.  Cardiovascular:     Rate and Rhythm: Normal rate and regular rhythm.     Heart sounds: No murmur heard. No friction rub. No gallop.   Pulmonary:     Effort: Pulmonary effort is normal. No respiratory distress.     Breath sounds: No stridor. Wheezing present. No rhonchi or rales.  Chest:     Chest wall: No tenderness.  Abdominal:     General: Abdomen is flat. Bowel sounds are normal. There is no distension.     Palpations: There is no mass.     Tenderness: There is no abdominal tenderness. There is no right CVA tenderness, left CVA tenderness, guarding or rebound.     Hernia: No hernia is present.  Musculoskeletal:        General: No swelling, tenderness, deformity or signs of injury. Normal range of motion.     Cervical back: Normal range of motion and neck supple. No rigidity or tenderness.     Right lower leg: Edema present.     Left lower leg: Edema present.  Lymphadenopathy:     Cervical: No cervical adenopathy.  Skin:    General: Skin is warm and dry.     Capillary Refill: Capillary refill takes less than 2 seconds.     Coloration: Skin is not jaundiced or pale.     Findings: No bruising,  erythema, lesion or rash.  Neurological:     General: No focal deficit present.     Mental Status: He is alert and oriented to person, place, and time. Mental status is at baseline.     Cranial Nerves: No cranial nerve deficit.     Sensory: No sensory deficit.     Motor: No weakness.     Coordination: Coordination normal.     Gait: Gait normal.     Deep Tendon Reflexes: Reflexes normal.  Psychiatric:        Mood and Affect: Mood normal.        Behavior: Behavior normal.        Thought Content: Thought content normal.        Judgment: Judgment normal.     LABS:   CBC Latest Ref Rng & Units 01/20/2021 01/07/2021 11/19/2020  WBC - 9.0 9.0 9.8  Hemoglobin 13.5 - 17.5 13.8 14.3 14.1  Hematocrit 41 - 53 42 42.3 41  Platelets 150 - 399 322 314 257   CMP Latest Ref Rng & Units 01/20/2021 01/07/2021 11/19/2020  Glucose 65 - 99 mg/dL - 94 -  BUN 4 - 21 27(A) 21 27(A)  Creatinine 0.6 - 1.3 1.1 1.05 1.1  Sodium 137 - 147 137 141 138  Potassium 3.4 - 5.3 4.2 4.1 3.9  Chloride 99 - 108 103 104 103  CO2 13 - 22 25(A) 23 26(A)  Calcium 8.7 - 10.7 9.3 9.3 9.8  Total Protein 6.0 - 8.5 g/dL - 6.2 -  Total Bilirubin 0.0 - 1.2 mg/dL - <0.2 -  Alkaline Phos 25 - 125 91 89 88  AST 14 - 40 30 19 27   ALT 10 - 40 18 17 22      No results found for: CEA1 / No results found for: CEA1 Lab Results  Component Value Date   PSA1 0.2 01/20/2021   No results found for: JAS505 No results found for: LZJ673  No results found for: TOTALPROTELP, ALBUMINELP, A1GS, A2GS, BETS, BETA2SER, GAMS, MSPIKE, SPEI No results found for: TIBC, FERRITIN, IRONPCTSAT No results found for: LDH  STUDIES:   Exam(s): [id number] CT/CT ANGIO CHEST CLINICAL DATA:  Shortness of breath.  History of prostate carcinoma  EXAM: CT ANGIOGRAPHY CHEST WITH CONTRAST  TECHNIQUE: Multidetector CT imaging of the chest was performed using the standard protocol during bolus administration of intravenous contrast. Multiplanar CT image  reconstructions and MIPs were obtained to evaluate the vascular anatomy.  CONTRAST:  80 mL Isovue 370 nonionic  COMPARISON:  Chest CT Apr 23, 2020; PET-CT May 20, 2020  FINDINGS: Cardiovascular: There are apparent pulmonary emboli in right lower lobe pulmonary artery segments. No other sites of pulmonary embolus noted. Note that there is extrinsic compression of a portion of distal right main pulmonary artery. No right heart strain. There is no thoracic aortic aneurysm or dissection. There are foci calcification in visualized great vessels. There are foci of aortic atherosclerosis. There are multiple foci of coronary artery calcification. There is a small amount of pericardial fluid which is felt to be upper physiologic. No pericardial thickening is evident.  Mediastinum/Nodes: Thyroid appears normal. There is apparent adenopathy in the right hilar region which surrounds the distal main right pulmonary artery. A lymph node in the superior right hilum measures 2.2 x 2.1 cm. A lymph node more inferiorly measures 3.0 x 1.5 cm. No other appreciable adenopathy. There is a fairly small focal hiatal hernia.  Lungs/Pleura: There is underlying emphysematous change. There is scarring and atelectasis in the lung bases. There is no edema or airspace opacity. A nodular opacity abutting the pleura in the left upper lobe measures 6 x 5 mm, slightly smaller than on previous study. No pleural effusions are evident. There is stable scarring in the extreme left apex.  Upper Abdomen: There is abdominal aortic atherosclerosis. There is a cyst arising from the mid left kidney posteriorly measuring 4.3 x 4.3 cm.  Musculoskeletal: There is stable anterior wedging of the T12 vertebral body. A small sclerotic focus in the T9 vertebral body is stable. No new foci of sclerosis. No lytic or destructive bone lesions. No chest wall lesions.  Review of the MIP images confirms the above  findings.  IMPRESSION: 1. There are pulmonary emboli in the right lower lobe pulmonary artery. No evident right heart strain.  2. There is apparent extrinsic compression of the distal right main pulmonary artery with what appears to be right hilar adenopathy in this area. Neoplastic etiology for  this adenopathy suspected.  3. Underlying emphysematous change with areas of scarring and atelectatic change. Nodular opacity in the left upper lobe abutting the pleura is slightly smaller at this time measuring 6 x 5 mm. No new nodular opacities evident.  4. Foci of aortic atherosclerosis and coronary artery calcification noted.  Aortic Atherosclerosis (ICD10-I70.0) and Emphysema (ICD10-J43.9).  Critical Value/emergent results were called by telephone at the time of interpretation on 01/21/2021 at 1:04 pm to provider Laurabelle Gorczyca NP, who verbally acknowledged these results.   Electronically Signed   By: Lowella Grip III M.D.   On: 01/21/2021 13:04  Electronically Signed By: Erling Conte MD  Electronically Signed Date/Time: 02/08/221307 Dictate Date/Time: 01/21/21 1247  Technologist: Jory Sims M    HISTORY:   Past Medical History:  Diagnosis Date  . Anxiety   . Cervical disc disorder with myelopathy, cervicothoracic region   . Chronic pain of left knee 08/11/2016  . 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 Congestive heart failure with LV diastolic dysfunction, NYHA class 1 ([place]) 09/21/2018  . Dyslipidemia 08/11/2017  . Dyspnea on exertion 04/23/2020  . Edema extremities 04/14/2018  . Elevated glucose 10/17/2019  . Fatigue 04/23/2020  . Food poisoning   . GAD (generalized anxiety disorder)   . Headache    sinus  . History of pulmonary embolism 06/13/2020  . History of pulmonary embolus (PE) 06/13/2020  . Hypertension   . Hypertensive heart disease 08/05/2017  . Hypoxia 10/17/2019  . Inguinal lymphadenopathy 05/26/2020  . LVH (left ventricular hypertrophy) due  to hypertensive disease 08/11/2017  . Malignant neoplasm of prostate ([place]) 09/09/2020  . Mixed hyperlipidemia   . Orthostatic hypotension   . Orthostatic syncope 10/17/2019  . OSA on CPAP   . Prostate cancer ([place])   . Pulmonary embolism ([place]) 04/2020  . PVC's (premature ventricular contractions) 05/18/2018  . Rectal mass 05/24/2020  . 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 Solid nodule of lung greater than 8 mm in diameter 05/26/2020    Past Surgical History:  Procedure Laterality Date  . CARDIAC CATHETERIZATION    . CATARACT EXTRACTION W/ INTRAOCULAR LENS  IMPLANT, BILATERAL Bilateral   . COLON SURGERY  2014  . COLOSTOMY REVERSAL  2014  . HERNIA REPAIR     x2  . JOINT REPLACEMENT    . PROSTATE SURGERY  2006  . SEPTOPLASTY    . TOTAL KNEE ARTHROPLASTY Left 09/28/2016   Procedure: TOTAL KNEE ARTHROPLASTY;  Surgeon: [name], MD;  Location: [facility];  Service: Orthopedics;  Laterality: Left;    Family History  Problem Relation Age of Onset  . Stroke Mother   . Parkinson's disease Mother   . Lung disease Father   . Stroke Father   . Alcoholism Father   . Leukemia Sister   . Muscular dystrophy Son        Immunologist  . Alzheimer's disease Neg Hx     Social History:  reports that he quit smoking about 43 years ago. His smoking use included cigarettes. He quit after 20.00 years of use. He has never used smokeless tobacco. He reports that he does not drink alcohol and does not use drugs.The patient is accompanied by his wife today.  Allergies:  Allergies  Allergen Reactions  . Beef-Derived Products Diarrhea  . Clonazepam Other (See Comments)    High doses cause drunk feeling (>1 mg)  . Monosodium Glutamate Other (See Comments)    MSG (soups and oriental foods)  .  Pegademase Bovine Diarrhea    Current Medications: Current Outpatient Medications  Medication Sig Dispense Refill  . amLODipine (NORVASC) 5 MG tablet Take 1 tablet (5 mg total) by mouth  daily. 90 tablet 1  . busPIRone (BUSPAR) 30 MG tablet Take 1 tablet (30 mg total) by mouth 2 (two) times daily. 60 tablet 3  . darolutamide (NUBEQA) 300 MG tablet Take 600 mg by mouth daily. Monday's, Wednesday's and Fridays    . furosemide (LASIX) 20 MG tablet take 1 tablet (20 mg) by oral route once daily 90 tablet 1  . hydrOXYzine (ATARAX/VISTARIL) 25 MG tablet TAKE 1-2 TABLETS BY MOUTH DAILY AS NEEDED FOR ITCHING (Patient taking differently: Take 25 mg by mouth in the morning and at bedtime.) 60 tablet 2  . labetalol (NORMODYNE) 300 MG tablet Take 1 tablet (300 mg total) by mouth 2 (two) times daily. (Patient taking differently: Take 300 mg by mouth daily in the afternoon.) 180 tablet 1  . Leuprolide Acetate, 3 Month, (LUPRON DEPOT, 27-MONTH, IM) Inject into the muscle.    . lisinopril (ZESTRIL) 20 MG tablet Take 1 tablet (20 mg total) by mouth daily. 90 tablet 1  . PARoxetine (PAXIL) 40 MG tablet TAKE ONE TABLET BY MOUTH EVERY DAY 90 tablet 2  . pravastatin (PRAVACHOL) 10 MG tablet TAKE 1 TABLET BY MOUTH AT BEDTIME 90 tablet 1   No current facility-administered medications for this visit.        ASSESSMENT & PLAN:   Assessment/Plan:  A 80 y.o. male with metastatic prostate cancer. CTA today revealed apparent pulmonary emboli in right lower lobe pulmonary artery segments. There is apparent adenopathy in the right hilar region which surrounds the distal main right pulmonary artery. A lymph node in the superior right hilum measures 2.2 x 2.1 cm. A lymph node more inferiorly measures 3.0 x 1.5 cm. These are concerning for neoplastic etiology. I discussed the findings with the patient and his wife and instructed the patient to start back on his Eliquis at 5 mg twice daily. He stopped this medicine a week ago. We discussed the importance of remaining on this medicine. In regards to the enlarged lymph nodes, we will further investigate these and follow up imaging. He will return to clinic in one  week for evaluation with Dr. Bobby Rumpf.    The patient understands the plans discussed today and is in agreement with them.  He knows to contact our office if he develops concerns prior to his next appointment.     Melodye Ped, NP

## 2021-01-21 NOTE — Telephone Encounter (Signed)
Patient scheduled for *STAT* CTA Chest  Per 2/8 LOS, patient scheduled for 2/14 Follow Up w/[facility]  Printed Appt Summary to be given to patient when he returns from having CTA Chest

## 2021-01-21 NOTE — Progress Notes (Deleted)
  [facility]  [address] [place],  [place]  [address] [phone]  Clinic Day:  12/8/221  Referring physician: Rochel Brome, MD   HISTORY OF PRESENT ILLNESS:  The patient is a 80 y.o. 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  There was also evidence of disease being studded along his anal canal.  Her remains on complete androgen deprivation therapy, which consists of Lupron/darolutamide.  The patient has tolerated this combination regimen fairly well.  However, pruritus remains an issue, which he attributes to his darolutamide therapy.  His dose has already been decreased to 300 mg daily.  As it pertains to his prostate cancer, he denies having any new symptoms or findings that concern him for disease progression while on his complete androgen blockade therapy.  PHYSICAL EXAM:  There were no vitals taken for this visit. Wt Readings from Last 3 Encounters:  01/07/21 186 lb (84.4 kg)  12/23/20 194 lb 12 oz (88.3 kg)  12/17/20 186 lb (84.4 kg)   There is no height or weight on file to calculate BMI. Performance status (ECOG): 0 - Asymptomatic Physical Exam  LABS:   CBC Latest Ref Rng & Units 01/20/2021 01/07/2021 11/19/2020  WBC - 9.0 9.0 9.8  Hemoglobin 13.5 - 17.5 13.8 14.3 14.1  Hematocrit 41 - 53 42 42.3 41  Platelets 150 - 399 322 314 257   CMP Latest Ref Rng & Units 01/20/2021 01/07/2021 11/19/2020  Glucose 65 - 99 mg/dL - 94 -  BUN 4 - 21 27(A) 21 27(A)  Creatinine 0.6 - 1.3 1.1 1.05 1.1  Sodium 137 - 147 137 141 138  Potassium 3.4 - 5.3 4.2 4.1 3.9  Chloride 99 - 108 103 104 103  CO2 13 - 22 25(A) 23 26(A)  Calcium 8.7 - 10.7 9.3 9.3 9.8  Total Protein 6.0 - 8.5 g/dL - 6.2 -  Total Bilirubin 0.0 - 1.2 mg/dL - <0.2 -  Alkaline Phos 25 - 125 91 89 88  AST 14 - 40 30 19 27   ALT 10 - 40 18 17 22     Lab Results  Component Value Date   PSA1 0.2 01/20/2021    Ref. Range  08/02/2020 11:02 09/19/2020 00:00 10/23/2020 10:33 11/19/2020 00:00 11/19/2020 11:41  Prostate Specific Ag, Serum Latest Ref Range: 0.0 - 4.0 ng/mL 0.6    0.3     ASSESSMENT & PLAN:  Assessment/Plan:  A 80 y.o. male with metastatic prostate cancer.  I am pleased as his PSA level continues to fall while on his complete androgen blockade therapy.   The only problem that persists is his skin rash, for which he believes his darolutamide is responsible.  As his PSA continues to fall and he is clinically doing well, I have told him to decrease his darolutamide to 300 mg 4/7 days per week.  His PSA level will continue to be followed closely to ensure his disease is not getting worse to where a change in therapy may need to be considered.  I will see him back in 2 months for repeat clinical assessment.  The patient understands all the plans discussed today and is in agreement with them.      Melodye Ped, NP

## 2021-01-23 ENCOUNTER — Other Ambulatory Visit: Payer: Self-pay | Admitting: Hematology and Oncology

## 2021-01-24 ENCOUNTER — Other Ambulatory Visit: Payer: Self-pay | Admitting: Hematology and Oncology

## 2021-01-24 MED ORDER — ALPRAZOLAM 0.5 MG PO TABS: 0.5000 mg | ORAL_TABLET | Freq: Three times a day (TID) | ORAL | 0 refills | Status: DC | PRN

## 2021-01-26 NOTE — Progress Notes (Signed)
[facility]  [address] [place],  [place]  [address] [phone]  Clinic Day: 01/27/2021  Referring physician: Blane Ohara, MD   HISTORY OF PRESENT ILLNESS:  The patient is a 80 y.o. 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  There was also evidence of disease being studded along his anal canal.  He has been on complete androgen deprivation therapy, which consisted of Lupron/darolutamide.  The patient has tolerated this combination regimen fairly well.  However, he recently developed another pulmonary embolus last week.  He admitted to being noncompliant with his Eliquis for 4-5 days before his pulmonary embolus was found.  He is now back to taking it at 2.5 mg twice daily.  He is concerned with the cost of this medication and whether it can get covered with his insurance.  Of note, he still has significant pruritus and a skin rash for which he is scheduled to see Dermatology this week for a likely steroid injection.  He believes his worsening pruritus and rash are related to his darolutamide therapy.  Based upon this, he has held this agent over the past few days.  PHYSICAL EXAM:  Blood pressure 135/70, pulse 61, temperature 97.8 F (36.6 C), resp. rate 16, height 5\' 10"  (1.778 m), weight 194 lb 12.8 oz (88.4 kg), SpO2 93 %. Wt Readings from Last 3 Encounters:  01/27/21 194 lb 12.8 oz (88.4 kg)  01/21/21 195 lb 3.2 oz (88.5 kg)  01/07/21 186 lb (84.4 kg)   Body mass index is 27.95 kg/m. Performance status (ECOG): 0 - Asymptomatic Physical Exam Constitutional:      Appearance: Normal appearance. He is not ill-appearing.  HENT:     Mouth/Throat:     Mouth: Mucous membranes are moist.     Pharynx: Oropharynx is clear. No oropharyngeal exudate or posterior oropharyngeal erythema.  Cardiovascular:     Rate and Rhythm: Normal rate and regular rhythm.     Heart sounds: No murmur heard. No  friction rub. No gallop.   Pulmonary:     Effort: Pulmonary effort is normal. No respiratory distress.     Breath sounds: Normal breath sounds. No wheezing, rhonchi or rales.  Chest:  Breasts:     Right: No axillary adenopathy or supraclavicular adenopathy.     Left: No axillary adenopathy or supraclavicular adenopathy.    Abdominal:     General: Bowel sounds are normal. There is no distension.     Palpations: Abdomen is soft. There is no mass.     Tenderness: There is no abdominal tenderness.  Musculoskeletal:        General: No swelling.     Right lower leg: No edema.     Left lower leg: No edema.  Lymphadenopathy:     Cervical: No cervical adenopathy.     Upper Body:     Right upper body: No supraclavicular or axillary adenopathy.     Left upper body: No supraclavicular or axillary adenopathy.     Lower Body: No right inguinal adenopathy. No left inguinal adenopathy.  Skin:    General: Skin is warm.     Coloration: Skin is not jaundiced.     Findings: Rash (diffusely present over his abdomen and sides) present. No lesion.  Neurological:     General: No focal deficit present.     Mental Status: He is alert and oriented to person, place, and time. Mental status is at baseline.  Cranial Nerves: Cranial nerves are intact.  Psychiatric:        Mood and Affect: Mood normal.        Behavior: Behavior normal.        Thought Content: Thought content normal.     LABS:   CBC Latest Ref Rng & Units 01/20/2021 01/07/2021 11/19/2020  WBC - 9.0 9.0 9.8  Hemoglobin 13.5 - 17.5 13.8 14.3 14.1  Hematocrit 41 - 53 42 42.3 41  Platelets 150 - 399 322 314 257   CMP Latest Ref Rng & Units 01/20/2021 01/07/2021 11/19/2020  Glucose 65 - 99 mg/dL - 94 -  BUN 4 - 21 24(M) 21 27(A)  Creatinine 0.6 - 1.3 1.1 1.05 1.1  Sodium 137 - 147 137 141 138  Potassium 3.4 - 5.3 4.2 4.1 3.9  Chloride 99 - 108 103 104 103  CO2 13 - 22 25(A) 23 26(A)  Calcium 8.7 - 10.7 9.3 9.3 9.8  Total Protein 6.0 -  8.5 g/dL - 6.2 -  Total Bilirubin 0.0 - 1.2 mg/dL - <0.1 -  Alkaline Phos 25 - 125 91 89 88  AST 14 - 40 30 19 27   ALT 10 - 40 18 17 22     Lab Results  Component Value Date   PSA1 0.2 01/20/2021    Ref. Range 08/02/2020 11:02 09/19/2020 00:00 10/23/2020 10:33 11/19/2020 00:00 11/19/2020 11:41  Prostate Specific Ag, Serum Latest Ref Range: 0.0 - 4.0 ng/mL 0.6    0.3     ASSESSMENT & PLAN:  Assessment/Plan:  A 80 y.o. male with metastatic prostate cancer.  I am pleased as his PSA level remains low from his complete androgen blockade therapy.   As mentioned previously, a pulmonary embolus redeveloped last week, with a huge component being related to noncompliance.  He knows to stay on Eliquis, which he needs to be on indefinitely.  We will see if there is any way his Eliquis can get covered moving forward.  With respect to his prostate cancer, as he believes his rash is related to his darolutamide and his PSA is low, I have told him to hold this agent for the next month to see if his rash improves.  If so, I would consider switching him to a different agent that can keep his disease under control without causing skin toxicity.   I will see him back in 1 month for repeat clinical assessment.  The patient understands all the plans discussed today and is in agreement with them.      Arnelle Nale Kirby Funk, MD

## 2021-01-27 ENCOUNTER — Inpatient Hospital Stay (INDEPENDENT_AMBULATORY_CARE_PROVIDER_SITE_OTHER): Payer: Medicare Other | Admitting: Oncology

## 2021-01-27 ENCOUNTER — Other Ambulatory Visit: Payer: Self-pay

## 2021-01-27 ENCOUNTER — Telehealth: Payer: Self-pay | Admitting: Oncology

## 2021-01-27 VITALS — BP 135/70 | HR 61 | Temp 97.8°F | Resp 16 | Ht 70.0 in | Wt 194.8 lb

## 2021-01-27 DIAGNOSIS — C61 Malignant neoplasm of prostate: Secondary | ICD-10-CM | POA: Diagnosis not present

## 2021-01-27 NOTE — Telephone Encounter (Signed)
Per 2/14 LOS, patient scheduled for March Appt's - Called Patient.  Mailed Appt Summary

## 2021-01-28 DIAGNOSIS — L300 Nummular dermatitis: Secondary | ICD-10-CM | POA: Diagnosis not present

## 2021-01-28 DIAGNOSIS — L501 Idiopathic urticaria: Secondary | ICD-10-CM | POA: Diagnosis not present

## 2021-01-29 ENCOUNTER — Ambulatory Visit: Payer: Medicare Other | Admitting: Oncology

## 2021-02-10 ENCOUNTER — Telehealth: Payer: Self-pay

## 2021-02-10 NOTE — Telephone Encounter (Signed)
Form completed per Dr. Tobie Poet

## 2021-02-10 NOTE — Telephone Encounter (Signed)
Pt left msg requesting 2 handicap placards. One for each car. Please advise.  Marcus King 02/10/21 11:33 AM

## 2021-02-11 ENCOUNTER — Other Ambulatory Visit: Payer: Self-pay

## 2021-02-12 ENCOUNTER — Other Ambulatory Visit: Payer: Self-pay | Admitting: Family Medicine

## 2021-02-12 NOTE — Progress Notes (Signed)
Patients Eliquis was unaffordable, I helped him fill out the application through [employer] for free medication. He was approved from 02/11/2021-12/13/2021. ID# [id number]   The pharmacy with [employer] will reach out in about 3 days to schedule his first delivery. He can call them if he has not heard from them at [phone]

## 2021-02-21 ENCOUNTER — Inpatient Hospital Stay: Payer: Medicare Other | Attending: Oncology

## 2021-02-21 ENCOUNTER — Other Ambulatory Visit: Payer: Self-pay | Admitting: Hematology and Oncology

## 2021-02-21 ENCOUNTER — Other Ambulatory Visit: Payer: Self-pay

## 2021-02-21 DIAGNOSIS — C61 Malignant neoplasm of prostate: Secondary | ICD-10-CM | POA: Diagnosis not present

## 2021-02-21 DIAGNOSIS — D649 Anemia, unspecified: Secondary | ICD-10-CM | POA: Diagnosis not present

## 2021-02-21 DIAGNOSIS — Z7901 Long term (current) use of anticoagulants: Secondary | ICD-10-CM | POA: Diagnosis not present

## 2021-02-21 DIAGNOSIS — Z86711 Personal history of pulmonary embolism: Secondary | ICD-10-CM | POA: Diagnosis not present

## 2021-02-21 LAB — CBC
MCV: 90 (ref 80–94)
RBC: 4.25 (ref 3.87–5.11)

## 2021-02-21 LAB — CBC AND DIFFERENTIAL
HCT: 38 — AB (ref 41–53)
Hemoglobin: 13 — AB (ref 13.5–17.5)
Neutrophils Absolute: 5.37
Platelets: 241 (ref 150–399)
WBC: 7.9

## 2021-02-21 LAB — COMPREHENSIVE METABOLIC PANEL
Albumin: 3.8 (ref 3.5–5.0)
Calcium: 9.1 (ref 8.7–10.7)

## 2021-02-21 LAB — HEPATIC FUNCTION PANEL
ALT: 22 (ref 10–40)
AST: 33 (ref 14–40)
Alkaline Phosphatase: 74 (ref 25–125)
Bilirubin, Total: 0.6

## 2021-02-21 LAB — BASIC METABOLIC PANEL
BUN: 23 — AB (ref 4–21)
CO2: 26 — AB (ref 13–22)
Chloride: 105 (ref 99–108)
Creatinine: 1.1 (ref 0.6–1.3)
Glucose: 120
Potassium: 3.9 (ref 3.4–5.3)
Sodium: 137 (ref 137–147)

## 2021-02-22 LAB — PROSTATE-SPECIFIC AG, SERUM (LABCORP): Prostate Specific Ag, Serum: 0.2 ng/mL (ref 0.0–4.0)

## 2021-02-23 NOTE — Progress Notes (Signed)
[facility]  [address] [place],  [place]  [address] [phone]  Clinic Day: 02/24/2021  Referring physician: Rochel Brome, MD   HISTORY OF PRESENT ILLNESS:  The patient is a 80 y.o. 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  There was also evidence of metastatic disease studded along his anal canal.  He had been on complete androgen deprivation therapy, which consisted of Lupron/darolutamide.  This therapy was very effective in bringing down his PSA level, but due to a persistent rash he attributed to his darolutamide, he has been off of this medication over the past month.  Since his darolutamide was stopped, his rash  has not improved.  He continues to scratch incessantly.  He recently received a Kenalog shot,  which ultimately did not with his rash.  His skin problems are becoming increasingly frustrating to where he wishes to see a different dermatologist to re-evaluate his clinical picture.  With respect to his previous pulmonary embolus, he remains on Eliquis, which he will be on indefinitely.  PHYSICAL EXAM:  Blood pressure (!) 148/69, pulse (!) 59, temperature (!) 97.5 F (36.4 C), resp. rate 16, height 5\' 10"  (1.778 m), weight 194 lb 1.6 oz (88 kg), SpO2 95 %. Wt Readings from Last 3 Encounters:  02/24/21 194 lb 1.6 oz (88 kg)  01/27/21 194 lb 12.8 oz (88.4 kg)  01/21/21 195 lb 3.2 oz (88.5 kg)   Body mass index is 27.85 kg/m. Performance status (ECOG): 1 Physical Exam Constitutional:      Appearance: Normal appearance. He is not ill-appearing.  HENT:     Mouth/Throat:     Mouth: Mucous membranes are moist.     Pharynx: Oropharynx is clear. No oropharyngeal exudate or posterior oropharyngeal erythema.  Cardiovascular:     Rate and Rhythm: Normal rate and regular rhythm.     Heart sounds: No murmur heard. No friction rub. No gallop.   Pulmonary:     Effort: Pulmonary  effort is normal. No respiratory distress.     Breath sounds: Normal breath sounds. No wheezing, rhonchi or rales.  Chest:  Breasts:     Right: No axillary adenopathy or supraclavicular adenopathy.     Left: No axillary adenopathy or supraclavicular adenopathy.    Abdominal:     General: Bowel sounds are normal. There is no distension.     Palpations: Abdomen is soft. There is no mass.     Tenderness: There is no abdominal tenderness.  Musculoskeletal:        General: No swelling.     Right lower leg: No edema.     Left lower leg: No edema.  Lymphadenopathy:     Cervical: No cervical adenopathy.     Upper Body:     Right upper body: No supraclavicular or axillary adenopathy.     Left upper body: No supraclavicular or axillary adenopathy.     Lower Body: No right inguinal adenopathy. No left inguinal adenopathy.  Skin:    General: Skin is warm.     Coloration: Skin is not jaundiced.     Findings: Rash (diffusely present over his abdomen and lower chest) present. No lesion.  Neurological:     General: No focal deficit present.     Mental Status: He is oriented to person, place, and time. Mental status is at baseline. He is lethargic.     Cranial Nerves: Cranial nerves are intact.  Comments: Slightly confused  Psychiatric:        Mood and Affect: Mood normal.        Behavior: Behavior normal.        Thought Content: Thought content normal.     LABS:   CBC Latest Ref Rng & Units 02/21/2021 01/20/2021 01/07/2021  WBC - 7.9 9.0 9.0  Hemoglobin 13.5 - 17.5 13.0(A) 13.8 14.3  Hematocrit 41 - 53 38(A) 42 42.3  Platelets 150 - 399 241 322 314   CMP Latest Ref Rng & Units 02/21/2021 01/20/2021 01/07/2021  Glucose 65 - 99 mg/dL - - 94  BUN 4 - 21 23(A) 27(A) 21  Creatinine 0.6 - 1.3 1.1 1.1 1.05  Sodium 137 - 147 137 137 141  Potassium 3.4 - 5.3 3.9 4.2 4.1  Chloride 99 - 108 105 103 104  CO2 13 - 22 26(A) 25(A) 23  Calcium 8.7 - 10.7 9.1 9.3 9.3  Total Protein 6.0 - 8.5 g/dL -  - 6.2  Total Bilirubin 0.0 - 1.2 mg/dL - - <0.2  Alkaline Phos 25 - 125 74 91 89  AST 14 - 40 33 30 19  ALT 10 - 40 22 18 17     Lab Results  Component Value Date   PSA1 0.2 02/21/2021    Ref. Range 01/20/2021 11:20 01/21/2021 00:00 02/21/2021 00:00 02/21/2021 10:47  Prostate Specific Ag, Serum Latest Ref Range: 0.0 - 4.0 ng/mL 0.2   0.2    Ref. Range 08/02/2020 11:02 09/19/2020 00:00 10/23/2020 10:33 11/19/2020 00:00 11/19/2020 11:41  Prostate Specific Ag, Serum Latest Ref Range: 0.0 - 4.0 ng/mL 0.6    0.3     ASSESSMENT & PLAN:  Assessment/Plan:  An 80 y.o. male with metastatic prostate cancer.  I am pleased as his PSA level remains low  at 0.2 despite not taking the darolutamide component of his complete androgen blockade therapy for the past month.   With respect to his rash, it clearly appears it is not due to his darolutamide.  Based upon this, I do want him back on this agent to help keep his metastatic prostate cancer in check.  As his skin rash remains a major problem impacting his daily quality of life, I will refer him to a dermatologist in [place] to hopefully determine the etiology behind his rash and what can be done to treat it.  He knows to stay on Eliquis, which he will be on indefinitely for his pulmonary embolus, which developed in the setting of his metastatic disease.   Otherwise, I will see him back in 1 month for repeat clinical assessment.  The patient understands all the plans discussed today and is in agreement with them.       Macarthur Critchley, MD

## 2021-02-24 ENCOUNTER — Telehealth: Payer: Self-pay | Admitting: Oncology

## 2021-02-24 ENCOUNTER — Other Ambulatory Visit: Payer: Self-pay | Admitting: Oncology

## 2021-02-24 ENCOUNTER — Inpatient Hospital Stay (INDEPENDENT_AMBULATORY_CARE_PROVIDER_SITE_OTHER): Payer: Medicare Other | Admitting: Oncology

## 2021-02-24 ENCOUNTER — Other Ambulatory Visit: Payer: Self-pay

## 2021-02-24 VITALS — BP 148/69 | HR 59 | Temp 97.5°F | Resp 16 | Ht 70.0 in | Wt 194.1 lb

## 2021-02-24 DIAGNOSIS — R590 Localized enlarged lymph nodes: Secondary | ICD-10-CM | POA: Diagnosis not present

## 2021-02-24 DIAGNOSIS — C61 Malignant neoplasm of prostate: Secondary | ICD-10-CM | POA: Diagnosis not present

## 2021-02-24 NOTE — Progress Notes (Signed)
Patient states , " I am in a fog"  Dizzy, has been for quite some time.

## 2021-02-24 NOTE — Telephone Encounter (Signed)
Per 3/14 LOS, patient scheduled for April Appt's.  Gave patient Appt Summary 

## 2021-02-27 ENCOUNTER — Telehealth: Payer: Self-pay

## 2021-02-27 NOTE — Telephone Encounter (Signed)
Pt's wife LVM on nurse triage line req to speak with Tammy. I returned call to pt to see if I could help. She wanted to make sure they understood correctly that Dr Bobby Rumpf was going to send a referral for [place] to see a dermatologist in [place] for skin biopsy (for persistant itching). I looked @ Dr Bobby Rumpf' office note and he does he mention the referral. I couldn't see anything in chart documented as of yet. I transferred call to [facility].

## 2021-02-27 NOTE — Telephone Encounter (Signed)
Spoke with Mrs. Marcus King and advised that referral was sent to [facility] yesterday afternoon.  They should be hearing from them today to sched appt.

## 2021-03-06 ENCOUNTER — Other Ambulatory Visit: Payer: Self-pay

## 2021-03-06 NOTE — Progress Notes (Signed)
Subjective:  Patient ID: Marcus King, male    DOB: [birth year]  Age: 80 y.o. MRN: [id number]  Chief Complaint  Patient presents with  . Anxiety  . Hypertension  . Hyperlipidemia    HPI Hypertensive heart disease with chronic diastolic congestive heart failure (HCC) Norvasc 10 mg once daily, lasix 20 mg once daily, on lisinopril 40 mg one twice a day, and carvedilol 25 mg one twice a day and labetolol once daily .  Patient frequently adjusts his medicines without discussion.  GAD (generalized anxiety disorder) Paroxetine 40 mg once in am, hydroxyzine 25 mg one twice a day, buspirone 30 mg one twice a day.   Mixed hyperlipidemia Pravastatin 10 mg once daily. Eating healthy.  Hx PE-Eliquis  Complaining of Itchy rash constantly for years, but escalated in the last 6 months. Scratches and then bleeds because of eliquis. Kenalog shot given by dermatology did not help.  Hydroxyzine helps when taken, but comes back between doses. Rash is on the back and front (mainly torso.) Triamcinalone cream, OTC lotions (aveeno, acetaphil.) OTC antihistamine. Makes him very anxious. Takes paxil 40 mg once in am and buspirone 30 mg one twice a day. Unable to tolerate prednisone, because it makes him agitated. Given xanax by Dr. Bobby Rumpf, but it made him staggery and thick tongued.   Prostate Cancer: on lupron and darolutamide.    Pulmonary embolism: May 2021. On eliquis.    Current Outpatient Medications on File Prior to Visit  Medication Sig Dispense Refill  . ALPRAZolam (XANAX) 0.5 MG tablet Take 1 tablet (0.5 mg total) by mouth 3 (three) times daily as needed for anxiety. 90 tablet 0  . amLODipine (NORVASC) 5 MG tablet Take 1 tablet (5 mg total) by mouth daily. 90 tablet 1  . busPIRone (BUSPAR) 30 MG tablet Take 1 tablet (30 mg total) by mouth 2 (two) times daily. 60 tablet 3  . darolutamide (NUBEQA) 300 MG tablet Take 300 mg by mouth daily. 4 days each week    . furosemide (LASIX) 20 MG tablet  take 1 tablet (20 mg) by oral route once daily 90 tablet 1  . hydrOXYzine (ATARAX/VISTARIL) 25 MG tablet Take 1 tablet (25 mg total) by mouth in the morning and at bedtime. 180 tablet 1  . labetalol (NORMODYNE) 300 MG tablet Take 1 tablet (300 mg total) by mouth 2 (two) times daily. (Patient taking differently: Take 300 mg by mouth daily in the afternoon.) 180 tablet 1  . Leuprolide Acetate, 3 Month, (LUPRON DEPOT, 35-MONTH, IM) Inject into the muscle.    . lisinopril (ZESTRIL) 20 MG tablet Take 1 tablet (20 mg total) by mouth daily. 90 tablet 1  . PARoxetine (PAXIL) 40 MG tablet TAKE ONE TABLET BY MOUTH EVERY DAY 90 tablet 2   No current facility-administered medications on file prior to visit.   Past Medical History:  Diagnosis Date  . Anxiety   . Cervical disc disorder with myelopathy, cervicothoracic region   . Chronic pain of left knee 08/11/2016  . 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 Congestive heart failure with LV diastolic dysfunction, NYHA class 1 ([place]) 09/21/2018  . Dyslipidemia 08/11/2017  . Dyspnea on exertion 04/23/2020  . Edema extremities 04/14/2018  . Elevated glucose 10/17/2019  . Fatigue 04/23/2020  . Food poisoning   . GAD (generalized anxiety disorder)   . Headache    sinus  . History of pulmonary embolism 06/13/2020  . History of pulmonary embolus (PE) 06/13/2020  . Hypertension   .  Hypertensive heart disease 08/05/2017  . Hypoxia 10/17/2019  . Inguinal lymphadenopathy 05/26/2020  . LVH (left ventricular hypertrophy) due to hypertensive disease 08/11/2017  . Malignant neoplasm of prostate ([place]) 09/09/2020  . Mixed hyperlipidemia   . Orthostatic hypotension   . Orthostatic syncope 10/17/2019  . OSA on CPAP   . Prostate cancer ([place])   . Pulmonary embolism ([place]) 04/2020  . PVC's (premature ventricular contractions) 05/18/2018  . Rectal mass 05/24/2020  . 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 Solid nodule of lung greater than 8 mm in diameter  05/26/2020   Past Surgical History:  Procedure Laterality Date  . CARDIAC CATHETERIZATION    . CATARACT EXTRACTION W/ INTRAOCULAR LENS  IMPLANT, BILATERAL Bilateral   . COLON SURGERY  2014  . COLOSTOMY REVERSAL  2014  . HERNIA REPAIR     x2  . JOINT REPLACEMENT    . PROSTATE SURGERY  2006  . SEPTOPLASTY    . TOTAL KNEE ARTHROPLASTY Left 09/28/2016   Procedure: TOTAL KNEE ARTHROPLASTY;  Surgeon: [name], MD;  Location: [facility];  Service: Orthopedics;  Laterality: Left;    Family History  Problem Relation Age of Onset  . Stroke Mother   . Parkinson's disease Mother   . Lung disease Father   . Stroke Father   . Alcoholism Father   . Leukemia Sister   . Muscular dystrophy Son        Immunologist  . Alzheimer's disease Neg Hx    Social History   Socioeconomic History  . Marital status: Married    Spouse name: Not on file  . Number of children: 2  . Years of education: Not on file  . Highest education level: High school graduate  Occupational History  . Occupation: Engineer, petroleum    Comment: ICDs/Wound Care  Tobacco Use  . Smoking status: Former Smoker    Years: 20.00    Types: Cigarettes    Quit date: 1979    Years since quitting: 43.2  . Smokeless tobacco: Never Used  . Tobacco comment: quit 37 years ago  Vaping Use  . Vaping Use: Never used  Substance and Sexual Activity  . Alcohol use: No  . Drug use: No  . Sexual activity: Not on file  Other Topics Concern  . Not on file  Social History Narrative   LIves at home with his wife   Self employed   Right handed   Drinks about 2 cups of caffeine daily   Social Determinants of Health   Financial Resource Strain: Not on file  Food Insecurity: No Food Insecurity  . Worried About Charity fundraiser in the Last Year: Never true  . Ran Out of Food in the Last Year: Never true  Transportation Needs: Not on file  Physical Activity: Not on file  Stress: Not on file  Social Connections: Not on file    Review  of Systems  Constitutional: Negative for chills, diaphoresis, fatigue and fever.  HENT: Negative for congestion, ear pain and sore throat.   Respiratory: Negative for cough and shortness of breath.   Cardiovascular: Negative for chest pain and leg swelling.  Gastrointestinal: Negative for abdominal pain, constipation, diarrhea, nausea and vomiting.  Genitourinary: Negative for dysuria and urgency.  Musculoskeletal: Negative for arthralgias and myalgias.  Neurological: Negative for dizziness and headaches.  Psychiatric/Behavioral: Negative for dysphoric mood.     Objective:  BP 116/64   Pulse 63   Temp (!) 96.9  F (36.1 C)   Ht 5\' 10"  (1.778 m)   Wt 197 lb (89.4 kg)   SpO2 99%   BMI 28.27 kg/m   BP/Weight 03/07/2021 02/24/2021 03/05/5572  Systolic BP 220 254 270  Diastolic BP 64 69 70  Wt. (Lbs) 197 194.1 194.8  BMI 28.27 27.85 27.95    Physical Exam Vitals reviewed.  Constitutional:      Appearance: Normal appearance. He is normal weight.  Neck:     Vascular: No carotid bruit.  Cardiovascular:     Rate and Rhythm: Normal rate and regular rhythm.     Heart sounds: Normal heart sounds. No murmur heard.   Pulmonary:     Effort: Pulmonary effort is normal.     Breath sounds: Normal breath sounds.  Abdominal:     General: Abdomen is flat. Bowel sounds are normal.     Palpations: Abdomen is soft.     Tenderness: There is no abdominal tenderness.  Skin:    Findings: Rash (scaly, itchy rash over torso anterior and upper back. ) present.  Neurological:     Mental Status: He is alert and oriented to person, place, and time.  Psychiatric:        Behavior: Behavior normal.     Comments: anxious     Lab Results  Component Value Date   WBC 8.6 03/07/2021   HGB 14.9 03/07/2021   HCT 44.3 03/07/2021   PLT 337 03/07/2021   GLUCOSE 91 03/07/2021   CHOL 178 03/07/2021   TRIG 102 03/07/2021   HDL 54 03/07/2021   LDLCALC 106 (H) 03/07/2021   ALT 18 03/07/2021   AST 19  03/07/2021   NA 140 03/07/2021   K 4.5 03/07/2021   CL 101 03/07/2021   CREATININE 1.13 03/07/2021   BUN 26 03/07/2021   CO2 23 03/07/2021   TSH 1.300 01/07/2021   INR 1.03 01/09/2019   HGBA1C 5.6 12/01/2017      Assessment & Plan:   1. Hypertensive heart disease with chronic diastolic congestive heart failure (HCC) Continue current medications - Comprehensive metabolic panel  2. GAD (generalized anxiety disorder) The current medical regimen is effective;  continue present plan and medications.  3. Mixed hyperlipidemia Well controlled.  No changes to medicines.  Continue to work on eating a healthy diet and exercise.  Labs drawn today.  - Lipid panel - CBC with Differential/Platelet  4. Atopic neurodermatitis Appointment made at [facility] for today at 11:00 a.m Given stronger steroid ointment to try. Diprolene ointment. Gabapentin 100 mg one three times a day for itching.  Consider referral to [facility].  Defer to dermatology as to if they would like this.  Recommend skin biopsy.   Meds ordered this encounter  Medications  . augmented betamethasone dipropionate (DIPROLENE) 0.05 % ointment    Sig: Apply topically 2 (two) times daily.    Dispense:  100 g    Refill:  1  . gabapentin (NEURONTIN) 100 MG capsule    Sig: Take 1 capsule (100 mg total) by mouth 3 (three) times daily.    Dispense:  90 capsule    Refill:  1    Orders Placed This Encounter  Procedures  . Comprehensive metabolic panel  . Lipid panel  . CBC with Differential/Platelet  . Cardiovascular Risk Assessment    Follow-up: Return in about 3 months (around 06/07/2021) for fasting.  An After Visit Summary was printed and given to the patient.  Rochel Brome, MD [facility]  [facility] Practice [phone]

## 2021-03-07 ENCOUNTER — Ambulatory Visit (INDEPENDENT_AMBULATORY_CARE_PROVIDER_SITE_OTHER): Payer: Medicare Other | Admitting: Family Medicine

## 2021-03-07 ENCOUNTER — Other Ambulatory Visit: Payer: Self-pay

## 2021-03-07 VITALS — BP 116/64 | HR 63 | Temp 96.9°F | Ht 70.0 in | Wt 197.0 lb

## 2021-03-07 DIAGNOSIS — D485 Neoplasm of uncertain behavior of skin: Secondary | ICD-10-CM | POA: Diagnosis not present

## 2021-03-07 DIAGNOSIS — I110 Hypertensive heart disease with heart failure: Secondary | ICD-10-CM | POA: Diagnosis not present

## 2021-03-07 DIAGNOSIS — F411 Generalized anxiety disorder: Secondary | ICD-10-CM | POA: Diagnosis not present

## 2021-03-07 DIAGNOSIS — L299 Pruritus, unspecified: Secondary | ICD-10-CM | POA: Diagnosis not present

## 2021-03-07 DIAGNOSIS — L309 Dermatitis, unspecified: Secondary | ICD-10-CM | POA: Diagnosis not present

## 2021-03-07 DIAGNOSIS — L2081 Atopic neurodermatitis: Secondary | ICD-10-CM | POA: Diagnosis not present

## 2021-03-07 DIAGNOSIS — I5032 Chronic diastolic (congestive) heart failure: Secondary | ICD-10-CM | POA: Diagnosis not present

## 2021-03-07 DIAGNOSIS — L500 Allergic urticaria: Secondary | ICD-10-CM | POA: Diagnosis not present

## 2021-03-07 DIAGNOSIS — E782 Mixed hyperlipidemia: Secondary | ICD-10-CM | POA: Diagnosis not present

## 2021-03-07 MED ORDER — GABAPENTIN 100 MG PO CAPS: 100.0000 mg | ORAL_CAPSULE | Freq: Three times a day (TID) | ORAL | 1 refills | Status: DC

## 2021-03-07 MED ORDER — BETAMETHASONE DIPROPIONATE AUG 0.05 % EX OINT: TOPICAL_OINTMENT | Freq: Two times a day (BID) | CUTANEOUS | 1 refills | Status: DC

## 2021-03-07 NOTE — Patient Instructions (Addendum)
Appointment made at [facility] for today at 11:00 a.m Given stronger steroid ointment to try. Diprolene ointment. Gabapentin 100 mg one three times a day for itching.  Consider referral to [facility].  Defer to dermatology as to if they would like this.  Recommend skin biopsy.

## 2021-03-08 LAB — CBC WITH DIFFERENTIAL/PLATELET
Basophils Absolute: 0 10*3/uL (ref 0.0–0.2)
Basos: 0 %
EOS (ABSOLUTE): 0.5 10*3/uL — ABNORMAL HIGH (ref 0.0–0.4)
Eos: 6 %
Hematocrit: 44.3 % (ref 37.5–51.0)
Hemoglobin: 14.9 g/dL (ref 13.0–17.7)
Immature Grans (Abs): 0.1 10*3/uL (ref 0.0–0.1)
Immature Granulocytes: 1 %
Lymphocytes Absolute: 1.5 10*3/uL (ref 0.7–3.1)
Lymphs: 18 %
MCH: 30.1 pg (ref 26.6–33.0)
MCHC: 33.6 g/dL (ref 31.5–35.7)
MCV: 90 fL (ref 79–97)
Monocytes Absolute: 0.8 10*3/uL (ref 0.1–0.9)
Monocytes: 9 %
Neutrophils Absolute: 5.7 10*3/uL (ref 1.4–7.0)
Neutrophils: 66 %
Platelets: 337 10*3/uL (ref 150–450)
RBC: 4.95 x10E6/uL (ref 4.14–5.80)
RDW: 12.6 % (ref 11.6–15.4)
WBC: 8.6 10*3/uL (ref 3.4–10.8)

## 2021-03-08 LAB — COMPREHENSIVE METABOLIC PANEL
ALT: 18 IU/L (ref 0–44)
AST: 19 IU/L (ref 0–40)
Albumin/Globulin Ratio: 1.7 (ref 1.2–2.2)
Albumin: 4.4 g/dL (ref 3.7–4.7)
Alkaline Phosphatase: 83 IU/L (ref 44–121)
BUN/Creatinine Ratio: 23 (ref 10–24)
BUN: 26 mg/dL (ref 8–27)
Bilirubin Total: 0.6 mg/dL (ref 0.0–1.2)
CO2: 23 mmol/L (ref 20–29)
Calcium: 10 mg/dL (ref 8.6–10.2)
Chloride: 101 mmol/L (ref 96–106)
Creatinine, Ser: 1.13 mg/dL (ref 0.76–1.27)
Globulin, Total: 2.6 g/dL (ref 1.5–4.5)
Glucose: 91 mg/dL (ref 65–99)
Potassium: 4.5 mmol/L (ref 3.5–5.2)
Sodium: 140 mmol/L (ref 134–144)
Total Protein: 7 g/dL (ref 6.0–8.5)
eGFR: 66 mL/min/{1.73_m2} (ref >59)

## 2021-03-08 LAB — LIPID PANEL
Chol/HDL Ratio: 3.3 ratio (ref 0.0–5.0)
Cholesterol, Total: 178 mg/dL (ref 100–199)
HDL: 54 mg/dL (ref >39)
LDL Chol Calc (NIH): 106 mg/dL — ABNORMAL HIGH (ref 0–99)
Triglycerides: 102 mg/dL (ref 0–149)
VLDL Cholesterol Cal: 18 mg/dL (ref 5–40)

## 2021-03-08 LAB — CARDIOVASCULAR RISK ASSESSMENT

## 2021-03-10 DIAGNOSIS — R21 Rash and other nonspecific skin eruption: Secondary | ICD-10-CM | POA: Diagnosis not present

## 2021-03-10 DIAGNOSIS — L821 Other seborrheic keratosis: Secondary | ICD-10-CM | POA: Diagnosis not present

## 2021-03-10 DIAGNOSIS — D692 Other nonthrombocytopenic purpura: Secondary | ICD-10-CM | POA: Diagnosis not present

## 2021-03-10 DIAGNOSIS — L820 Inflamed seborrheic keratosis: Secondary | ICD-10-CM | POA: Diagnosis not present

## 2021-03-11 ENCOUNTER — Other Ambulatory Visit: Payer: Self-pay

## 2021-03-11 ENCOUNTER — Encounter: Payer: Self-pay | Admitting: Family Medicine

## 2021-03-11 MED ORDER — PRAVASTATIN SODIUM 20 MG PO TABS: 20.0000 mg | ORAL_TABLET | Freq: Every day | ORAL | 1 refills | Status: DC

## 2021-03-17 ENCOUNTER — Other Ambulatory Visit: Payer: Self-pay | Admitting: Pharmacist

## 2021-03-19 DIAGNOSIS — L299 Pruritus, unspecified: Secondary | ICD-10-CM | POA: Diagnosis not present

## 2021-03-19 DIAGNOSIS — L501 Idiopathic urticaria: Secondary | ICD-10-CM | POA: Diagnosis not present

## 2021-03-21 ENCOUNTER — Inpatient Hospital Stay: Payer: Medicare Other | Attending: Oncology

## 2021-03-21 ENCOUNTER — Other Ambulatory Visit: Payer: Self-pay

## 2021-03-21 VITALS — BP 144/71 | HR 65 | Temp 97.5°F | Resp 18 | Ht 70.0 in | Wt 191.8 lb

## 2021-03-21 DIAGNOSIS — Z86711 Personal history of pulmonary embolism: Secondary | ICD-10-CM | POA: Diagnosis not present

## 2021-03-21 DIAGNOSIS — C61 Malignant neoplasm of prostate: Secondary | ICD-10-CM | POA: Insufficient documentation

## 2021-03-21 DIAGNOSIS — Z7901 Long term (current) use of anticoagulants: Secondary | ICD-10-CM | POA: Insufficient documentation

## 2021-03-21 MED ORDER — LEUPROLIDE ACETATE (3 MONTH) 22.5 MG IM KIT
PACK | INTRAMUSCULAR | Status: AC
  Filled 2021-03-21: qty 22.5

## 2021-03-21 MED ORDER — LEUPROLIDE ACETATE (3 MONTH) 22.5 MG IM KIT
22.5000 mg | PACK | Freq: Once | INTRAMUSCULAR | Status: AC
  Administered 2021-03-21: 22.5 mg via INTRAMUSCULAR

## 2021-03-21 NOTE — Patient Instructions (Signed)
Leuprolide injection What is this medicine? LEUPROLIDE (loo PROE lide) is a man-made hormone. It is used to treat the symptoms of prostate cancer. This medicine may also be used to treat children with early onset of puberty. It may be used for other hormonal conditions. This medicine may be used for other purposes; ask your health care provider or pharmacist if you have questions. COMMON BRAND NAME(S): Lupron What should I tell my health care provider before I take this medicine? They need to know if you have any of these conditions:  diabetes  heart disease or previous heart attack  high blood pressure  high cholesterol  pain or difficulty passing urine  spinal cord metastasis  stroke  tobacco smoker  an unusual or allergic reaction to leuprolide, benzyl alcohol, other medicines, foods, dyes, or preservatives  pregnant or trying to get pregnant  breast-feeding How should I use this medicine? This medicine is for injection under the skin or into a muscle. You will be taught how to prepare and give this medicine. Use exactly as directed. Take your medicine at regular intervals. Do not take your medicine more often than directed. It is important that you put your used needles and syringes in a special sharps container. Do not put them in a trash can. If you do not have a sharps container, call your pharmacist or healthcare provider to get one. A special MedGuide will be given to you by the pharmacist with each prescription and refill. Be sure to read this information carefully each time. Talk to your pediatrician regarding the use of this medicine in children. While this medicine may be prescribed for children as young as 8 years for selected conditions, precautions do apply. Overdosage: If you think you have taken too much of this medicine contact a poison control center or emergency room at once. NOTE: This medicine is only for you. Do not share this medicine with others. What if  I miss a dose? If you miss a dose, take it as soon as you can. If it is almost time for your next dose, take only that dose. Do not take double or extra doses. What may interact with this medicine? Do not take this medicine with any of the following medications:  chasteberry  cisapride  dronedarone  pimozide  thioridazine This medicine may also interact with the following medications:  herbal or dietary supplements, like black cohosh or DHEA  male hormones, like estrogens or progestins and birth control pills, patches, rings, or injections  male hormones, like testosterone  other medicines that prolong the QT interval (abnormal heart rhythm) This list may not describe all possible interactions. Give your health care provider a list of all the medicines, herbs, non-prescription drugs, or dietary supplements you use. Also tell them if you smoke, drink alcohol, or use illegal drugs. Some items may interact with your medicine. What should I watch for while using this medicine? Visit your doctor or health care professional for regular checks on your progress. During the first week, your symptoms may get worse, but then will improve as you continue your treatment. You may get hot flashes, increased bone pain, increased difficulty passing urine, or an aggravation of nerve symptoms. Discuss these effects with your doctor or health care professional, some of them may improve with continued use of this medicine. Male patients may experience a menstrual cycle or spotting during the first 2 months of therapy with this medicine. If this continues, contact your doctor or health care professional.   This medicine may increase blood sugar. Ask your healthcare provider if changes in diet or medicines are needed if you have diabetes. What side effects may I notice from receiving this medicine? Side effects that you should report to your doctor or health care professional as soon as possible:  allergic  reactions like skin rash, itching or hives, swelling of the face, lips, or tongue  breathing problems  chest pain  depression or memory disorders  pain in your legs or groin  pain at site where injected  severe headache  signs and symptoms of high blood sugar such as being more thirsty or hungry or having to urinate more than normal. You may also feel very tired or have blurry vision  swelling of the feet and legs  visual changes  vomiting Side effects that usually do not require medical attention (report to your doctor or health care professional if they continue or are bothersome):  breast swelling or tenderness  decrease in sex drive or performance  diarrhea  hot flashes  loss of appetite  muscle, joint, or bone pains  nausea  redness or irritation at site where injected  skin problems or acne This list may not describe all possible side effects. Call your doctor for medical advice about side effects. You may report side effects to FDA at 1-800-FDA-1088. Where should I keep my medicine? Keep out of the reach of children. Store below 25 degrees C (77 degrees F). Do not freeze. Protect from light. Do not use if it is not clear or if there are particles present. Throw away any unused medicine after the expiration date. NOTE: This sheet is a summary. It may not cover all possible information. If you have questions about this medicine, talk to your doctor, pharmacist, or health care provider.  2021 Elsevier/Gold Standard (2019-11-01 10:57:41)  

## 2021-03-24 DIAGNOSIS — L280 Lichen simplex chronicus: Secondary | ICD-10-CM | POA: Diagnosis not present

## 2021-03-24 DIAGNOSIS — L298 Other pruritus: Secondary | ICD-10-CM | POA: Diagnosis not present

## 2021-03-24 DIAGNOSIS — L500 Allergic urticaria: Secondary | ICD-10-CM | POA: Diagnosis not present

## 2021-03-26 ENCOUNTER — Other Ambulatory Visit: Payer: Self-pay | Admitting: Legal Medicine

## 2021-03-26 ENCOUNTER — Ambulatory Visit (INDEPENDENT_AMBULATORY_CARE_PROVIDER_SITE_OTHER): Payer: Medicare Other | Admitting: Nurse Practitioner

## 2021-03-26 ENCOUNTER — Encounter: Payer: Self-pay | Admitting: Nurse Practitioner

## 2021-03-26 ENCOUNTER — Other Ambulatory Visit: Payer: Self-pay

## 2021-03-26 VITALS — BP 120/68 | HR 64 | Temp 97.1°F | Resp 18 | Ht 70.0 in | Wt 194.0 lb

## 2021-03-26 DIAGNOSIS — I952 Hypotension due to drugs: Secondary | ICD-10-CM | POA: Diagnosis not present

## 2021-03-26 DIAGNOSIS — I110 Hypertensive heart disease with heart failure: Secondary | ICD-10-CM | POA: Diagnosis not present

## 2021-03-26 MED ORDER — METOPROLOL TARTRATE 50 MG PO TABS: 50.0000 mg | ORAL_TABLET | Freq: Two times a day (BID) | ORAL | 3 refills | Status: DC

## 2021-03-26 NOTE — Progress Notes (Signed)
Acute Office Visit  Subjective:    Patient ID: Marcus King, male    DOB: 11-29-1941, 80 y.o.   MRN: [id number]  Chief Complaint  Patient presents with  . Blood pressure medication management    HPI Marcus King is an 80 year old Caucasian male that presents with intermittent hypertension/hypotension. He is accompanied by his spouse and adult daughter, who is a Equities trader.BP 126/82, pulse 76 in office today.  Marcus King has been experiencing fluctuations in BP from 96/59 to 166/104. He has experienced fatigue, dyspnea, and generalized malaise.Marcus King admits to changing antihypertensive medications and not taking them as prescribed.He is followed by Dr Agustin Cree, cardiology. Cardiac mdications were adjusted by Dr Agustin Cree at last visit on 12/17/20.Per medical record review, pt was instructed to decrease Lisinopril 40 mg to QD rather than BID due to fluctuations in BP. Pt was also instructed to increase physical activity and walk twice daily.  He is scheduled to return to Dr Agustin Cree in mid-May.   Marcus King was seen by PCP in this office on 03/07/21. Per medical record review medication list reflected Lisinopril 40 mg BID rather than QD per cardiologist recommendation. Medication list also listed Labetalol 300 mg QD and Carvedilol 25 mg QD. Pt was given instructions per PCP to continue the following medications for control of hypertension: Norvasc 10 mg QD, Lasix 20 mg QD, Lisinopril 40 mg BID, and Carvedilol 25 mg BID.    Pt provided a list of current medications at today's visit: Norvasc 5 mg QD, Lasix 20 mg QD, Lisinopril 20 mg BID,  Carvedilol 25 mg QAM, Labetalol 300 mg QHS. No documented diagnosis of dementia or cognitive decline. Pt's daughter did share that Marcus King experienced some mild confusion after taking an "extra dose" of Hydroxyzine 25 mg BID. Pt instructed to take medication as prescribed. Reinforcement given by daughter to patient to contact physicians prior to changes  medication regimen and to report adverse symptoms.Pt cautioned about abruptly stopping beta blockers and risk of rebound tachycardia.    Dr Henrene Pastor, back-up supervising physician brought in to evaluate patient and medications to reconciled medications. Physician order given to discontinue Labetalol 300 mg QHS and to resume Metoprolol 50 mg BID.   Marcus King has a chronic rash to torso. He is being followed by dermatology that prescribed course of Prednisone and provided samples of topical ointments on 03/24/21. He has not began prescribed treatment for rash at this time.   Past Medical History:  Diagnosis Date  . Anxiety   . Cervical disc disorder with myelopathy, cervicothoracic region   . Chronic pain of left knee 08/11/2016  . 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 Congestive heart failure with LV diastolic dysfunction, NYHA class 1 ([place]) 09/21/2018  . Dyslipidemia 08/11/2017  . Dyspnea on exertion 04/23/2020  . Edema extremities 04/14/2018  . Elevated glucose 10/17/2019  . Fatigue 04/23/2020  . Food poisoning   . GAD (generalized anxiety disorder)   . Headache    sinus  . History of pulmonary embolism 06/13/2020  . History of pulmonary embolus (PE) 06/13/2020  . Hypertension   . Hypertensive heart disease 08/05/2017  . Hypoxia 10/17/2019  . Inguinal lymphadenopathy 05/26/2020  . LVH (left ventricular hypertrophy) due to hypertensive disease 08/11/2017  . Malignant neoplasm of prostate ([place]) 09/09/2020  . Mixed hyperlipidemia   . Orthostatic hypotension   . Orthostatic syncope 10/17/2019  . OSA on CPAP   . Prostate cancer ([place])   . Pulmonary embolism ([place]) 04/2020  . PVC's (premature  ventricular contractions) 05/18/2018  . Rectal mass 05/24/2020  . 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 Solid nodule of lung greater than 8 mm in diameter 05/26/2020    Past Surgical History:  Procedure Laterality Date  . CARDIAC CATHETERIZATION    . CATARACT EXTRACTION W/ INTRAOCULAR  LENS  IMPLANT, BILATERAL Bilateral   . COLON SURGERY  2014  . COLOSTOMY REVERSAL  2014  . HERNIA REPAIR     x2  . JOINT REPLACEMENT    . PROSTATE SURGERY  2006  . SEPTOPLASTY    . TOTAL KNEE ARTHROPLASTY Left 09/28/2016   Procedure: TOTAL KNEE ARTHROPLASTY;  Surgeon: [name], MD;  Location: [place];  Service: Orthopedics;  Laterality: Left;    Family History  Problem Relation Age of Onset  . Stroke Mother   . Parkinson's disease Mother   . Lung disease Father   . Stroke Father   . Alcoholism Father   . Leukemia Sister   . Muscular dystrophy Son        Immunologist  . Alzheimer's disease Neg Hx     Social History   Socioeconomic History  . Marital status: Married    Spouse name: Not on file  . Number of children: 2  . Years of education: Not on file  . Highest education level: High school graduate  Occupational History  . Occupation: Engineer, petroleum    Comment: ICDs/Wound Care  Tobacco Use  . Smoking status: Former Smoker    Years: 20.00    Types: Cigarettes    Quit date: 1979    Years since quitting: 43.3  . Smokeless tobacco: Never Used  . Tobacco comment: quit 37 years ago  Vaping Use  . Vaping Use: Never used  Substance and Sexual Activity  . Alcohol use: No  . Drug use: No  . Sexual activity: Not on file  Other Topics Concern  . Not on file  Social History Narrative   LIves at home with his wife   Self employed   Right handed   Drinks about 2 cups of caffeine daily   Social Determinants of Health   Financial Resource Strain: Not on file  Food Insecurity: No Food Insecurity  . Worried About Charity fundraiser in the Last Year: Never true  . Ran Out of Food in the Last Year: Never true  Transportation Needs: Not on file  Physical Activity: Not on file  Stress: Not on file  Social Connections: Not on file  Intimate Partner Violence: Not on file    Outpatient Medications Prior to Visit  Medication Sig Dispense Refill  . amLODipine (NORVASC)  5 MG tablet Take 1 tablet (5 mg total) by mouth daily. 90 tablet 1  . Apixaban (ELIQUIS PO) Take 5 mg by mouth in the morning and at bedtime.    . busPIRone (BUSPAR) 30 MG tablet Take 1 tablet (30 mg total) by mouth 2 (two) times daily. 60 tablet 3  . carvedilol (COREG) 25 MG tablet 25 mg daily.    . darolutamide (NUBEQA) 300 MG tablet Take 300 mg by mouth daily. 3 days each week    . furosemide (LASIX) 20 MG tablet take 1 tablet (20 mg) by oral route once daily 90 tablet 1  . hydrOXYzine (ATARAX/VISTARIL) 25 MG tablet Take 1 tablet (25 mg total) by mouth in the morning and at bedtime. 180 tablet 1  . labetalol (NORMODYNE) 300 MG tablet Take 1 tablet (300 mg total)  by mouth 2 (two) times daily. (Patient taking differently: Take 300 mg by mouth daily in the afternoon.) 180 tablet 1  . Leuprolide Acetate, 3 Month, (LUPRON DEPOT, 5-MONTH, IM) Inject into the muscle.    . lisinopril (ZESTRIL) 20 MG tablet Take 1 tablet (20 mg total) by mouth daily. 90 tablet 1  . PARoxetine (PAXIL) 40 MG tablet TAKE ONE TABLET BY MOUTH EVERY DAY 90 tablet 2  . pravastatin (PRAVACHOL) 20 MG tablet Take 1 tablet (20 mg total) by mouth daily. 90 tablet 1  . ALPRAZolam (XANAX) 0.5 MG tablet Take 1 tablet (0.5 mg total) by mouth 3 (three) times daily as needed for anxiety. (Patient not taking: Reported on 03/26/2021) 90 tablet 0  . augmented betamethasone dipropionate (DIPROLENE) 0.05 % ointment Apply topically 2 (two) times daily. 100 g 1  . gabapentin (NEURONTIN) 100 MG capsule Take 1 capsule (100 mg total) by mouth 3 (three) times daily. 90 capsule 1   No facility-administered medications prior to visit.    Allergies  Allergen Reactions  . Beef-Derived Products Diarrhea  . Clonazepam Other (See Comments)    High doses cause drunk feeling (>1 mg)  . Monosodium Glutamate Other (See Comments)    MSG (soups and oriental foods)  . Pegademase Bovine Diarrhea    Review of Systems  Constitutional: Positive for  fatigue.  Respiratory: Positive for shortness of breath.   Cardiovascular: Negative for chest pain and palpitations.  Skin: Positive for rash (torso).  Neurological: Positive for weakness.       Objective:    Physical Exam Vitals reviewed.  Constitutional:      Appearance: Normal appearance.  HENT:     Head: Normocephalic.  Cardiovascular:     Rate and Rhythm: Normal rate and regular rhythm.     Pulses: Normal pulses.     Heart sounds: Normal heart sounds.  Pulmonary:     Effort: Pulmonary effort is normal.     Breath sounds: Normal breath sounds.  Abdominal:     General: Bowel sounds are normal.     Palpations: Abdomen is soft.  Skin:    General: Skin is warm and dry.     Capillary Refill: Capillary refill takes less than 2 seconds.     Findings: Rash present.  Neurological:     General: No focal deficit present.     Mental Status: He is alert and oriented to person, place, and time.  Psychiatric:        Mood and Affect: Mood normal.        Behavior: Behavior normal.     BP 120/68   Pulse 64   Temp (!) 97.1 F (36.2 C)   Resp 18   Ht 5\' 10"  (1.778 m)   Wt 194 lb (88 kg)   BMI 27.84 kg/m  Wt Readings from Last 3 Encounters:  03/26/21 194 lb (88 kg)  03/21/21 191 lb 12 oz (87 kg)  03/07/21 197 lb (89.4 kg)    Health Maintenance Due  Topic Date Due  . TETANUS/TDAP  12/12/2019  . COVID-19 Vaccine (2 - Booster for Janssen series) 05/07/2020     Lab Results  Component Value Date   TSH 1.300 01/07/2021   Lab Results  Component Value Date   WBC 8.6 03/07/2021   HGB 14.9 03/07/2021   HCT 44.3 03/07/2021   MCV 90 03/07/2021   PLT 337 03/07/2021   Lab Results  Component Value Date   NA 140 03/07/2021   K  4.5 03/07/2021   CO2 23 03/07/2021   GLUCOSE 91 03/07/2021   BUN 26 03/07/2021   CREATININE 1.13 03/07/2021   BILITOT 0.6 03/07/2021   ALKPHOS 83 03/07/2021   AST 19 03/07/2021   ALT 18 03/07/2021   PROT 7.0 03/07/2021   ALBUMIN 4.4  03/07/2021   CALCIUM 10.0 03/07/2021   ANIONGAP 9 01/09/2019   Lab Results  Component Value Date   CHOL 178 03/07/2021   Lab Results  Component Value Date   HDL 54 03/07/2021   Lab Results  Component Value Date   LDLCALC 106 (H) 03/07/2021   Lab Results  Component Value Date   TRIG 102 03/07/2021   Lab Results  Component Value Date   CHOLHDL 3.3 03/07/2021   Lab Results  Component Value Date   HGBA1C 5.6 12/01/2017       Assessment & Plan:     1. Hypertensive heart disease with congestive heart failure, unspecified heart failure type (HCC) - metoprolol tartrate (LOPRESSOR) 50 MG tablet; Take 1 tablet (50 mg total) by mouth 2 (two) times daily.  Dispense: 180 tablet; Refill: 3 - Ambulatory referral to Cardiology  2. Hypotension due to drugs -Discontinue Labetalol 300 mg BID -Resume Metoprolol 50 mg BID   Stop Labetalol 300 mg  Begin Metoprolol 50 mg twice daily  Continue to monitor BP and pulse at home Follow-up with cardiology as scheduled Take medications as prescribed Seek emergency medical care for any serious health concerns  Contacted supervising physician via telephone after office visit, which is patient's PCP, to communicate changes to antihypertensive medications. PCP states pt was instructed to discontinue Labetalol during prior visit. Verbal order received to refer pt to Dr Grayce Sessions at [facility]-cardiologist to adjust antihypertensive medications. Telephoned pt immediately to notify him of referral to Dr Grayce Sessions for further hypertension management.   Follow-up: 4-weeks  Signed, Rip Harbour, NP

## 2021-03-26 NOTE — Patient Instructions (Signed)
Stop Labetalol 300 mg  Begin Metoprolol 50 mg twice daily  Continue to monitor BP and pulse at home Follow-up with cardiology as scheduled Take medications as prescribed Seek emergency medical care for any serious health concerns  Metoprolol Tablets What is this medicine? METOPROLOL (me TOE proe lole) is a beta blocker. It decreases the amount of work your heart has to do and helps your heart beat regularly. It is used to treat high blood pressure and/or prevent chest pain (also called angina). It is also used after a heart attack to prevent a second one. This medicine may be used for other purposes; ask your health care provider or pharmacist if you have questions. COMMON BRAND NAME(S): Lopressor What should I tell my health care provider before I take this medicine? They need to know if you have any of these conditions:  diabetes  heart or vessel disease like slow heart rate, worsening heart failure, heart block, sick sinus syndrome or Raynaud's disease  kidney disease  liver disease  lung or breathing disease, like asthma or emphysema  pheochromocytoma  thyroid disease  an unusual or allergic reaction to metoprolol, other beta-blockers, medicines, foods, dyes, or preservatives  pregnant or trying to get pregnant  breast-feeding How should I use this medicine? Take this drug by mouth with water. Take it as directed on the prescription label at the same time every day. You can take it with or without food. You should always take it the same way. Keep taking it unless your health care provider tells you to stop. Talk to your health care provider about the use of this drug in children. Special care may be needed. Overdosage: If you think you have taken too much of this medicine contact a poison control center or emergency room at once. NOTE: This medicine is only for you. Do not share this medicine with others. What if I miss a dose? If you miss a dose, take it as soon as you  can. If it is almost time for your next dose, take only that dose. Do not take double or extra doses. What may interact with this medicine? This medicine may interact with the following medications:  certain medicines for blood pressure, heart disease, irregular heart beat  certain medicines for depression like monoamine oxidase (MAO) inhibitors, fluoxetine, or paroxetine  clonidine  dobutamine  epinephrine  isoproterenol  reserpine This list may not describe all possible interactions. Give your health care provider a list of all the medicines, herbs, non-prescription drugs, or dietary supplements you use. Also tell them if you smoke, drink alcohol, or use illegal drugs. Some items may interact with your medicine. What should I watch for while using this medicine? Visit your doctor or health care professional for regular check ups. Contact your doctor right away if your symptoms worsen. Check your blood pressure and pulse rate regularly. Ask your health care professional what your blood pressure and pulse rate should be, and when you should contact them. You may get drowsy or dizzy. Do not drive, use machinery, or do anything that needs mental alertness until you know how this medicine affects you. Do not sit or stand up quickly, especially if you are an older patient. This reduces the risk of dizzy or fainting spells. Contact your doctor if these symptoms continue. Alcohol may interfere with the effect of this medicine. Avoid alcoholic drinks. This medicine may increase blood sugar. Ask your healthcare provider if changes in diet or medicines are needed if you  have diabetes. What side effects may I notice from receiving this medicine? Side effects that you should report to your doctor or health care professional as soon as possible:  allergic reactions like skin rash, itching or hives  cold or numb hands or feet  depression  difficulty breathing  faint  fever with sore  throat  irregular heartbeat, chest pain  rapid weight gain  signs and symptoms of high blood sugar such as being more thirsty or hungry or having to urinate more than normal. You may also feel very tired or have blurry vision.  swollen legs or ankles Side effects that usually do not require medical attention (report to your doctor or health care professional if they continue or are bothersome):  anxiety or nervousness  change in sex drive or performance  dry skin  headache  nightmares or trouble sleeping  short term memory loss  stomach upset or diarrhea This list may not describe all possible side effects. Call your doctor for medical advice about side effects. You may report side effects to FDA at 1-800-FDA-1088. Where should I keep my medicine? Keep out of the reach of children and pets. Store at room temperature between 15 and 30 degrees C (59 and 86 degrees F). Protect from moisture. Keep the container tightly closed. Throw away any unused drug after the expiration date. NOTE: This sheet is a summary. It may not cover all possible information. If you have questions about this medicine, talk to your doctor, pharmacist, or health care provider.  2021 Elsevier/Gold Standard (2019-07-13 17:21:17) Carvedilol Tablets What is this medicine? CARVEDILOL (KAR ve dil ol) is a beta blocker. It decreases the amount of work your heart has to do and helps your heart beat regularly. It treats high blood pressure. This medicine may be used for other purposes; ask your health care provider or pharmacist if you have questions. COMMON BRAND NAME(S): Coreg What should I tell my health care provider before I take this medicine? They need to know if you have any of these conditions:  circulation problems  diabetes  history of heart attack or heart disease  liver disease  lung or breathing disease, like asthma or emphysema  pheochromocytoma  slow or irregular heartbeat  thyroid  disease  an unusual or allergic reaction to carvedilol, other beta-blockers, medicines, foods, dyes, or preservatives  pregnant or trying to get pregnant  breast-feeding How should I use this medicine? Take this drug by mouth. Take it as directed on the prescription label at the same time every day. Take it with food. Keep taking it unless your health care provider tells you to stop. Talk to your health care provider about the use of this drug in children. Special care may be needed. Overdosage: If you think you have taken too much of this medicine contact a poison control center or emergency room at once. NOTE: This medicine is only for you. Do not share this medicine with others. What if I miss a dose? If you miss a dose, take it as soon as you can. If it is almost time for your next dose, take only that dose. Do not take double or extra doses. What may interact with this medicine? This medicine may interact with the following medications:  certain medicines for blood pressure, heart disease, irregular heart beat  certain medicines for depression, like fluoxetine or paroxetine  certain medicines for diabetes, like glipizide or glyburide  cimetidine  clonidine  cyclosporine  digoxin  MAOIs like Carbex,  Eldepryl, Marplan, Nardil, and Parnate  reserpine  rifampin This list may not describe all possible interactions. Give your health care provider a list of all the medicines, herbs, non-prescription drugs, or dietary supplements you use. Also tell them if you smoke, drink alcohol, or use illegal drugs. Some items may interact with your medicine. What should I watch for while using this medicine? Check your heart rate and blood pressure regularly while you are taking this medicine. Ask your doctor or health care professional what your heart rate and blood pressure should be, and when you should contact him or her. Do not stop taking this medicine suddenly. This could lead to  serious heart-related effects. Contact your doctor or health care professional if you have difficulty breathing while taking this drug. Check your weight daily. Ask your doctor or health care professional when you should notify him/her of any weight gain. You may get drowsy or dizzy. Do not drive, use machinery, or do anything that requires mental alertness until you know how this medicine affects you. To reduce the risk of dizzy or fainting spells, do not sit or stand up quickly. Alcohol can make you more drowsy, and increase flushing and rapid heartbeats. Avoid alcoholic drinks. This medicine may increase blood sugar. Ask your healthcare provider if changes in diet or medicines are needed if you have diabetes. If you are going to have surgery, tell your doctor or health care professional that you are taking this medicine. What side effects may I notice from receiving this medicine? Side effects that you should report to your doctor or health care professional as soon as possible:  allergic reactions like skin rash, itching or hives, swelling of the face, lips, or tongue  breathing problems  dark urine  irregular heartbeat   signs and symptoms of high blood sugar such as being more thirsty or hungry or having to urinate more than normal. You may also feel very tired or have blurry vision.  swollen legs or ankles  vomiting  yellowing of the eyes or skin Side effects that usually do not require medical attention (report to your doctor or health care professional if they continue or are bothersome):  change in sex drive or performance  diarrhea  dry eyes (especially if wearing contact lenses)  dry, itching skin  headache  nausea  unusually tired This list may not describe all possible side effects. Call your doctor for medical advice about side effects. You may report side effects to FDA at 1-800-FDA-1088. Where should I keep my medicine? Keep out of the reach of children and  pets. Store at room temperature between 20 and 25 degrees C (68 and 77 degrees F). Protect from moisture. Keep the container tightly closed. Throw away any unused drug after the expiration date. NOTE: This sheet is a summary. It may not cover all possible information. If you have questions about this medicine, talk to your doctor, pharmacist, or health care provider.  2021 Elsevier/Gold Standard (2019-07-07 17:42:09)

## 2021-03-27 ENCOUNTER — Other Ambulatory Visit: Payer: Self-pay | Admitting: Hematology and Oncology

## 2021-03-27 ENCOUNTER — Ambulatory Visit: Payer: Medicare Other | Admitting: Oncology

## 2021-03-27 ENCOUNTER — Inpatient Hospital Stay: Payer: Medicare Other

## 2021-03-27 DIAGNOSIS — Z7901 Long term (current) use of anticoagulants: Secondary | ICD-10-CM | POA: Diagnosis not present

## 2021-03-27 DIAGNOSIS — C61 Malignant neoplasm of prostate: Secondary | ICD-10-CM | POA: Diagnosis not present

## 2021-03-27 DIAGNOSIS — D649 Anemia, unspecified: Secondary | ICD-10-CM | POA: Diagnosis not present

## 2021-03-27 DIAGNOSIS — Z86711 Personal history of pulmonary embolism: Secondary | ICD-10-CM | POA: Diagnosis not present

## 2021-03-27 LAB — CBC AND DIFFERENTIAL
HCT: 39 — AB (ref 41–53)
Hemoglobin: 13.5 (ref 13.5–17.5)
Neutrophils Absolute: 6.23
Platelets: 274 (ref 150–399)
WBC: 8.9

## 2021-03-27 LAB — HEPATIC FUNCTION PANEL
ALT: 21 (ref 10–40)
AST: 28 (ref 14–40)
Alkaline Phosphatase: 80 (ref 25–125)
Bilirubin, Total: 0.4

## 2021-03-27 LAB — BASIC METABOLIC PANEL
BUN: 26 — AB (ref 4–21)
CO2: 26 — AB (ref 13–22)
Chloride: 105 (ref 99–108)
Creatinine: 1 (ref 0.6–1.3)
Glucose: 121
Potassium: 3.9 (ref 3.4–5.3)
Sodium: 140 (ref 137–147)

## 2021-03-27 LAB — COMPREHENSIVE METABOLIC PANEL
Albumin: 4.1 (ref 3.5–5.0)
Calcium: 9 (ref 8.7–10.7)

## 2021-03-27 LAB — CBC
MCV: 90 (ref 80–94)
RBC: 4.37 (ref 3.87–5.11)

## 2021-03-27 NOTE — Progress Notes (Signed)
[facility]  [address] [place],    [address] [phone]  Clinic Day:  03/28/2021  Referring physician: Rochel Brome, MD   HISTORY OF PRESENT ILLNESS:  The patient is a 80 y.o. 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  There was also evidence of metastatic disease studded along his anal canal.  He recently restarted complete androgen deprivation therapy, which consists of Lupron/darolutamide.  His darolutamide was held for a month as he thought it was behind his pruritus and skin rash.  However, these issues persisted while off his darolutamide for a full month, essentially ruling this agent out as the cause.  He did see dermatology in [place] recently, which have him taking prednisone daily.   He also recently underwent skin biopsy, whose results are pending.  As it pertains to his prostate cancer management, he denies having any side effects from his complete androgen blockade therapy.  He also denies having any new symptoms or findings that concern him for progression his metastatic prostate cancer.  PHYSICAL EXAM:  Blood pressure 139/63, pulse (!) 55, temperature 98.1 F (36.7 C), resp. rate 16, height 5\' 10"  (1.778 m), weight 194 lb 11.2 oz (88.3 kg), SpO2 94 %. Wt Readings from Last 3 Encounters:  03/28/21 194 lb 11.2 oz (88.3 kg)  03/26/21 194 lb (88 kg)  03/21/21 191 lb 12 oz (87 kg)   Body mass index is 27.94 kg/m. Performance status (ECOG): 1 Physical Exam Constitutional:      Appearance: Normal appearance. He is not ill-appearing.  HENT:     Mouth/Throat:     Mouth: Mucous membranes are moist.     Pharynx: Oropharynx is clear. No oropharyngeal exudate or posterior oropharyngeal erythema.  Cardiovascular:     Rate and Rhythm: Normal rate and regular rhythm.     Heart sounds: No murmur heard. No friction rub. No gallop.   Pulmonary:     Effort: Pulmonary effort  is normal. No respiratory distress.     Breath sounds: Normal breath sounds. No wheezing, rhonchi or rales.  Chest:  Breasts:     Right: No axillary adenopathy or supraclavicular adenopathy.     Left: No axillary adenopathy or supraclavicular adenopathy.    Abdominal:     General: Bowel sounds are normal. There is no distension.     Palpations: Abdomen is soft. There is no mass.     Tenderness: There is no abdominal tenderness.  Musculoskeletal:        General: No swelling.     Right lower leg: No edema.     Left lower leg: No edema.  Lymphadenopathy:     Cervical: No cervical adenopathy.     Upper Body:     Right upper body: No supraclavicular or axillary adenopathy.     Left upper body: No supraclavicular or axillary adenopathy.     Lower Body: No right inguinal adenopathy. No left inguinal adenopathy.  Skin:    General: Skin is warm.     Coloration: Skin is not jaundiced.     Findings: Rash (diffusely present over his abdomen and lower chest) present. No lesion.  Neurological:     General: No focal deficit present.     Mental Status: He is oriented to person, place, and time. Mental status is at baseline.     Cranial Nerves: Cranial nerves are intact.     Comments: Slightly confused  Psychiatric:  Mood and Affect: Mood normal.        Behavior: Behavior normal.        Thought Content: Thought content normal.     LABS:   CBC Latest Ref Rng & Units 03/27/2021 03/07/2021 02/21/2021  WBC - 8.9 8.6 7.9  Hemoglobin 13.5 - 17.5 13.5 14.9 13.0(A)  Hematocrit 41 - 53 39(A) 44.3 38(A)  Platelets 150 - 399 274 337 241   CMP Latest Ref Rng & Units 03/27/2021 03/07/2021 02/21/2021  Glucose 65 - 99 mg/dL - 91 -  BUN 4 - 21 26(A) 26 23(A)  Creatinine 0.6 - 1.3 1.0 1.13 1.1  Sodium 137 - 147 140 140 137  Potassium 3.4 - 5.3 3.9 4.5 3.9  Chloride 99 - 108 105 101 105  CO2 13 - 22 26(A) 23 26(A)  Calcium 8.7 - 10.7 9.0 10.0 9.1  Total Protein 6.0 - 8.5 g/dL - 7.0 -  Total  Bilirubin 0.0 - 1.2 mg/dL - 0.6 -  Alkaline Phos 25 - 125 80 83 74  AST 14 - 40 28 19 33  ALT 10 - 40 21 18 22     Lab Results  Component Value Date   PSA1 0.2 03/27/2021    Ref. Range 01/20/2021 11:20 01/21/2021 00:00 02/21/2021 00:00 02/21/2021 10:47  Prostate Specific Ag, Serum Latest Ref Range: 0.0 - 4.0 ng/mL 0.2   0.2    Ref. Range 08/02/2020 11:02 09/19/2020 00:00 10/23/2020 10:33 11/19/2020 00:00 11/19/2020 11:41  Prostate Specific Ag, Serum Latest Ref Range: 0.0 - 4.0 ng/mL 0.6    0.3     ASSESSMENT & PLAN:  Assessment/Plan:  An 80 y.o. male with metastatic prostate cancer.  His PSA level remains stable at 0.2.  This suggests that his combination of Lupron /darolutamide is keeping his disease under ideal control.  His current darolutamide dose is 300 mg every Monday, Wednesday, and Friday.   He will continue to receive Lupron injections every 3 months.  As his disease remains under control and he is clinically doing well, I will see him back in 3 months for repeat clinical assessment.  The patient understands all the plans discussed today and is in agreement with them.    Giannina Bartolome Macarthur Critchley, MD

## 2021-03-27 NOTE — Progress Notes (Unsigned)
t

## 2021-03-27 NOTE — Progress Notes (Signed)
Pt came into clinic for lab draw, & asked if if we could check his blood pressure. Pt reports he takes several blood pressure pills per day. Vitals taken (sitting & standing) & charted. Pt to return tomorrow for office visit w/Dr Bobby Rumpf. I instructed pt to make sure he changed positions slowly to avoid being light headed and/or dizzy. Pt verbalized understanding. Pt appreciative of our care.

## 2021-03-28 ENCOUNTER — Other Ambulatory Visit: Payer: Self-pay

## 2021-03-28 ENCOUNTER — Other Ambulatory Visit: Payer: Self-pay | Admitting: Oncology

## 2021-03-28 ENCOUNTER — Inpatient Hospital Stay (INDEPENDENT_AMBULATORY_CARE_PROVIDER_SITE_OTHER): Payer: Medicare Other | Admitting: Oncology

## 2021-03-28 ENCOUNTER — Telehealth: Payer: Self-pay | Admitting: Oncology

## 2021-03-28 VITALS — BP 139/63 | HR 55 | Temp 98.1°F | Resp 16 | Ht 70.0 in | Wt 194.7 lb

## 2021-03-28 DIAGNOSIS — C61 Malignant neoplasm of prostate: Secondary | ICD-10-CM

## 2021-03-28 LAB — PROSTATE-SPECIFIC AG, SERUM (LABCORP): Prostate Specific Ag, Serum: 0.2 ng/mL (ref 0.0–4.0)

## 2021-03-28 NOTE — Telephone Encounter (Signed)
Per 4/15 los next appt scheduled and given to patient

## 2021-04-02 ENCOUNTER — Other Ambulatory Visit: Payer: Self-pay | Admitting: Hematology and Oncology

## 2021-04-02 MED ORDER — DAROLUTAMIDE 300 MG PO TABS: 300.0000 mg | ORAL_TABLET | Freq: Every day | ORAL | 3 refills | Status: DC

## 2021-04-02 MED ORDER — ELIQUIS 5 MG PO TABS: 5.0000 mg | ORAL_TABLET | Freq: Two times a day (BID) | ORAL | 3 refills | Status: DC

## 2021-04-08 ENCOUNTER — Other Ambulatory Visit: Payer: Self-pay

## 2021-04-08 MED ORDER — HYDROXYZINE HCL 25 MG PO TABS: 25.0000 mg | ORAL_TABLET | Freq: Three times a day (TID) | ORAL | 1 refills | Status: DC | PRN

## 2021-04-11 ENCOUNTER — Other Ambulatory Visit: Payer: Self-pay

## 2021-04-11 DIAGNOSIS — I110 Hypertensive heart disease with heart failure: Secondary | ICD-10-CM

## 2021-04-11 MED ORDER — PAROXETINE HCL 40 MG PO TABS: 40.0000 mg | ORAL_TABLET | Freq: Every day | ORAL | 0 refills | Status: DC

## 2021-04-11 MED ORDER — AMLODIPINE BESYLATE 5 MG PO TABS: 5.0000 mg | ORAL_TABLET | Freq: Every day | ORAL | 0 refills | Status: DC

## 2021-04-11 NOTE — Telephone Encounter (Signed)
States he needs before weekend.   Marcus King, [place] 04/11/21 9:41 AM

## 2021-04-14 DIAGNOSIS — I10 Essential (primary) hypertension: Secondary | ICD-10-CM | POA: Diagnosis not present

## 2021-04-14 DIAGNOSIS — R001 Bradycardia, unspecified: Secondary | ICD-10-CM | POA: Diagnosis not present

## 2021-04-14 DIAGNOSIS — Z86718 Personal history of other venous thrombosis and embolism: Secondary | ICD-10-CM | POA: Diagnosis not present

## 2021-04-14 DIAGNOSIS — Z7901 Long term (current) use of anticoagulants: Secondary | ICD-10-CM | POA: Diagnosis not present

## 2021-04-14 DIAGNOSIS — R5381 Other malaise: Secondary | ICD-10-CM | POA: Diagnosis not present

## 2021-04-14 DIAGNOSIS — R5383 Other fatigue: Secondary | ICD-10-CM | POA: Diagnosis not present

## 2021-04-14 DIAGNOSIS — C61 Malignant neoplasm of prostate: Secondary | ICD-10-CM | POA: Diagnosis not present

## 2021-04-14 HISTORY — DX: Personal history of other venous thrombosis and embolism: Z86.718

## 2021-04-15 DIAGNOSIS — L270 Generalized skin eruption due to drugs and medicaments taken internally: Secondary | ICD-10-CM | POA: Diagnosis not present

## 2021-04-23 ENCOUNTER — Telehealth: Payer: Self-pay

## 2021-04-23 ENCOUNTER — Ambulatory Visit: Payer: Medicare Other | Admitting: Cardiology

## 2021-04-23 NOTE — Telephone Encounter (Signed)
I spoke with [facility] office and notified them that pt could take the [place] as prescribed.

## 2021-04-23 NOTE — Telephone Encounter (Signed)
-----   Message from Juanetta Beets, [facility] sent at 04/23/2021  3:44 PM EDT ----- Regarding: RE: Dupixent Contact: [phone] I ran an interaction checker with all of his medications and there was no interactions found with Dupixent.   ----- Message ----- From: Dairl Ponder, RN Sent: 04/23/2021   3:26 PM EDT To: Juanetta Beets, RPH, Melodye Ped, NP Subject: Dupixent                                       Received a call from [facility]. They have been trying to help with the care of the rash pt has had for some time. They are recommending starting Dupixent, but wanted to make sure it wouldn't interfere with any medications we give him?

## 2021-04-28 ENCOUNTER — Ambulatory Visit (INDEPENDENT_AMBULATORY_CARE_PROVIDER_SITE_OTHER): Payer: Medicare Other | Admitting: Nurse Practitioner

## 2021-04-28 ENCOUNTER — Other Ambulatory Visit: Payer: Self-pay

## 2021-04-28 ENCOUNTER — Encounter: Payer: Self-pay | Admitting: Nurse Practitioner

## 2021-04-28 VITALS — BP 110/60 | HR 56 | Temp 97.4°F | Ht 70.0 in | Wt 195.0 lb

## 2021-04-28 DIAGNOSIS — R21 Rash and other nonspecific skin eruption: Secondary | ICD-10-CM

## 2021-04-28 DIAGNOSIS — I5032 Chronic diastolic (congestive) heart failure: Secondary | ICD-10-CM

## 2021-04-28 DIAGNOSIS — I110 Hypertensive heart disease with heart failure: Secondary | ICD-10-CM

## 2021-04-28 DIAGNOSIS — I10 Essential (primary) hypertension: Secondary | ICD-10-CM | POA: Diagnosis not present

## 2021-04-28 LAB — COMPREHENSIVE METABOLIC PANEL
ALT: 15 IU/L (ref 0–44)
AST: 22 IU/L (ref 0–40)
Albumin/Globulin Ratio: 1.8 (ref 1.2–2.2)
Albumin: 4.4 g/dL (ref 3.7–4.7)
Alkaline Phosphatase: 85 IU/L (ref 44–121)
BUN/Creatinine Ratio: 13 (ref 10–24)
BUN: 16 mg/dL (ref 8–27)
Bilirubin Total: 0.4 mg/dL (ref 0.0–1.2)
CO2: 22 mmol/L (ref 20–29)
Calcium: 10 mg/dL (ref 8.6–10.2)
Chloride: 104 mmol/L (ref 96–106)
Creatinine, Ser: 1.23 mg/dL (ref 0.76–1.27)
Globulin, Total: 2.4 g/dL (ref 1.5–4.5)
Glucose: 91 mg/dL (ref 65–99)
Potassium: 4.1 mmol/L (ref 3.5–5.2)
Sodium: 143 mmol/L (ref 134–144)
Total Protein: 6.8 g/dL (ref 6.0–8.5)
eGFR: 59 mL/min/{1.73_m2} — ABNORMAL LOW (ref >59)

## 2021-04-28 NOTE — Patient Instructions (Signed)
Take medications as prescribed Stop carvedilol as ordered per Dr Otho Perl Follow-up with Dr Otho Perl as prescribed We will call you with lab results Follow-up with Dr Tobie Poet in 8-months, fasting  Fall Prevention in the Home, Adult Falls can cause injuries and can happen to people of all ages. There are many things you can do to make your home safe and to help prevent falls. Ask for help when making these changes. What actions can I take to prevent falls? General Instructions  Use good lighting in all rooms. Replace any light bulbs that burn out.  Turn on the lights in dark areas. Use night-lights.  Keep items that you use often in easy-to-reach places. Lower the shelves around your home if needed.  Set up your furniture so you have a clear path. Avoid moving your furniture around.  Do not have throw rugs or other things on the floor that can make you trip.  Avoid walking on wet floors.  If any of your floors are uneven, fix them.  Add color or contrast paint or tape to clearly mark and help you see: ? Grab bars or handrails. ? First and last steps of staircases. ? Where the edge of each step is.  If you use a stepladder: ? Make sure that it is fully opened. Do not climb a closed stepladder. ? Make sure the sides of the stepladder are locked in place. ? Ask someone to hold the stepladder while you use it.  Know where your pets are when moving through your home. What can I do in the bathroom?  Keep the floor dry. Clean up any water on the floor right away.  Remove soap buildup in the tub or shower.  Use nonskid mats or decals on the floor of the tub or shower.  Attach bath mats securely with double-sided, nonslip rug tape.  If you need to sit down in the shower, use a plastic, nonslip stool.  Install grab bars by the toilet and in the tub and shower. Do not use towel bars as grab bars.      What can I do in the bedroom?  Make sure that you have a light by your bed that is easy  to reach.  Do not use any sheets or blankets for your bed that hang to the floor.  Have a firm chair with side arms that you can use for support when you get dressed. What can I do in the kitchen?  Clean up any spills right away.  If you need to reach something above you, use a step stool with a grab bar.  Keep electrical cords out of the way.  Do not use floor polish or wax that makes floors slippery. What can I do with my stairs?  Do not leave any items on the stairs.  Make sure that you have a light switch at the top and the bottom of the stairs.  Make sure that there are handrails on both sides of the stairs. Fix handrails that are broken or loose.  Install nonslip stair treads on all your stairs.  Avoid having throw rugs at the top or bottom of the stairs.  Choose a carpet that does not hide the edge of the steps on the stairs.  Check carpeting to make sure that it is firmly attached to the stairs. Fix carpet that is loose or worn. What can I do on the outside of my home?  Use bright outdoor lighting.  Fix the edges  of walkways and driveways and fix any cracks.  Remove anything that might make you trip as you walk through a door, such as a raised step or threshold.  Trim any bushes or trees on paths to your home.  Check to see if handrails are loose or broken and that both sides of all steps have handrails.  Install guardrails along the edges of any raised decks and porches.  Clear paths of anything that can make you trip, such as tools or rocks.  Have leaves, snow, or ice cleared regularly.  Use sand or salt on paths during winter.  Clean up any spills in your garage right away. This includes grease or oil spills. What other actions can I take?  Wear shoes that: ? Have a low heel. Do not wear high heels. ? Have rubber bottoms. ? Feel good on your feet and fit well. ? Are closed at the toe. Do not wear open-toe sandals.  Use tools that help you move  around if needed. These include: ? Canes. ? Walkers. ? Scooters. ? Crutches.  Review your medicines with your doctor. Some medicines can make you feel dizzy. This can increase your chance of falling. Ask your doctor what else you can do to help prevent falls. Where to find more information  Centers for Disease Control and Prevention, STEADI: http://www.wolf.info/  National Institute on Aging: http://kim-miller.com/ Contact a doctor if:  You are afraid of falling at home.  You feel weak, drowsy, or dizzy at home.  You fall at home. Summary  There are many simple things that you can do to make your home safe and to help prevent falls.  Ways to make your home safe include removing things that can make you trip and installing grab bars in the bathroom.  Ask for help when making these changes in your home. This information is not intended to replace advice given to you by your health care provider. Make sure you discuss any questions you have with your health care provider. Document Revised: 07/03/2020 Document Reviewed: 07/03/2020 Elsevier Patient Education  [place] Your Hypertension Hypertension, also called high blood pressure, is when the force of the blood pressing against the walls of the arteries is too strong. Arteries are blood vessels that carry blood from your heart throughout your body. Hypertension forces the heart to work harder to pump blood and may cause the arteries to become narrow or stiff. Understanding blood pressure readings Your personal target blood pressure may vary depending on your medical conditions, your age, and other factors. A blood pressure reading includes a higher number over a lower number. Ideally, your blood pressure should be below 120/80. You should know that:  The first, or top, number is called the systolic pressure. It is a measure of the pressure in your arteries as your heart beats.  The second, or bottom number, is called the diastolic  pressure. It is a measure of the pressure in your arteries as the heart relaxes. Blood pressure is classified into four stages. Based on your blood pressure reading, your health care provider may use the following stages to determine what type of treatment you need, if any. Systolic pressure and diastolic pressure are measured in a unit called mmHg. Normal  Systolic pressure: below 062.  Diastolic pressure: below 80. Elevated  Systolic pressure: 694-854.  Diastolic pressure: below 80. Hypertension stage 1  Systolic pressure: 627-035.  Diastolic pressure: 00-93. Hypertension stage 2  Systolic pressure: 818 or above.  Diastolic pressure: 90 or above. How can this condition affect me? Managing your hypertension is an important responsibility. Over time, hypertension can damage the arteries and decrease blood flow to important parts of the body, including the brain, heart, and kidneys. Having untreated or uncontrolled hypertension can lead to:  A heart attack.  A stroke.  A weakened blood vessel (aneurysm).  Heart failure.  Kidney damage.  Eye damage.  Metabolic syndrome.  Memory and concentration problems.  Vascular dementia. What actions can I take to manage this condition? Hypertension can be managed by making lifestyle changes and possibly by taking medicines. Your health care provider will help you make a plan to bring your blood pressure within a normal range. Nutrition  Eat a diet that is high in fiber and potassium, and low in salt (sodium), added sugar, and fat. An example eating plan is called the Dietary Approaches to Stop Hypertension (DASH) diet. To eat this way: ? Eat plenty of fresh fruits and vegetables. Try to fill one-half of your plate at each meal with fruits and vegetables. ? Eat whole grains, such as whole-wheat pasta, brown rice, or whole-grain bread. Fill about one-fourth of your plate with whole grains. ? Eat low-fat dairy products. ? Avoid fatty  cuts of meat, processed or cured meats, and poultry with skin. Fill about one-fourth of your plate with lean proteins such as fish, chicken without skin, beans, eggs, and tofu. ? Avoid pre-made and processed foods. These tend to be higher in sodium, added sugar, and fat.  Reduce your daily sodium intake. Most people with hypertension should eat less than 1,500 mg of sodium a day.   Lifestyle  Work with your health care provider to maintain a healthy body weight or to lose weight. Ask what an ideal weight is for you.  Get at least 30 minutes of exercise that causes your heart to beat faster (aerobic exercise) most days of the week. Activities may include walking, swimming, or biking.  Include exercise to strengthen your muscles (resistance exercise), such as weight lifting, as part of your weekly exercise routine. Try to do these types of exercises for 30 minutes at least 3 days a week.  Do not use any products that contain nicotine or tobacco, such as cigarettes, e-cigarettes, and chewing tobacco. If you need help quitting, ask your health care provider.  Control any long-term (chronic) conditions you have, such as high cholesterol or diabetes.  Identify your sources of stress and find ways to manage stress. This may include meditation, deep breathing, or making time for fun activities.   Alcohol use  Do not drink alcohol if: ? Your health care provider tells you not to drink. ? You are pregnant, may be pregnant, or are planning to become pregnant.  If you drink alcohol: ? Limit how much you use to:  0-1 drink a day for women.  0-2 drinks a day for men. ? Be aware of how much alcohol is in your drink. In the [place] one drink equals one 12 oz bottle of beer (355 mL), one 5 oz glass of wine (148 mL), or one 1 oz glass of hard liquor (44 mL). Medicines Your health care provider may prescribe medicine if lifestyle changes are not enough to get your blood pressure under control and  if:  Your systolic blood pressure is 130 or higher.  Your diastolic blood pressure is 80 or higher. Take medicines only as told by your health care provider. Follow the directions carefully. Blood  pressure medicines must be taken as told by your health care provider. The medicine does not work as well when you skip doses. Skipping doses also puts you at risk for problems. Monitoring Before you monitor your blood pressure:  Do not smoke, drink caffeinated beverages, or exercise within 30 minutes before taking a measurement.  Use the bathroom and empty your bladder (urinate).  Sit quietly for at least 5 minutes before taking measurements. Monitor your blood pressure at home as told by your health care provider. To do this:  Sit with your back straight and supported.  Place your feet flat on the floor. Do not cross your legs.  Support your arm on a flat surface, such as a table. Make sure your upper arm is at heart level.  Each time you measure, take two or three readings one minute apart and record the results. You may also need to have your blood pressure checked regularly by your health care provider.   General information  Talk with your health care provider about your diet, exercise habits, and other lifestyle factors that may be contributing to hypertension.  Review all the medicines you take with your health care provider because there may be side effects or interactions.  Keep all visits as told by your health care provider. Your health care provider can help you create and adjust your plan for managing your high blood pressure. Where to find more information  National Heart, Lung, and Blood Institute: https://wilson-eaton.com/  American Heart Association: www.heart.org Contact a health care provider if:  You think you are having a reaction to medicines you have taken.  You have repeated (recurrent) headaches.  You feel dizzy.  You have swelling in your ankles.  You have  trouble with your vision. Get help right away if:  You develop a severe headache or confusion.  You have unusual weakness or numbness, or you feel faint.  You have severe pain in your chest or abdomen.  You vomit repeatedly.  You have trouble breathing. These symptoms may represent a serious problem that is an emergency. Do not wait to see if the symptoms will go away. Get medical help right away. Call your local emergency services (911 in the [place] Do not drive yourself to the hospital. Summary  Hypertension is when the force of blood pumping through your arteries is too strong. If this condition is not controlled, it may put you at risk for serious complications.  Your personal target blood pressure may vary depending on your medical conditions, your age, and other factors. For most people, a normal blood pressure is less than 120/80.  Hypertension is managed by lifestyle changes, medicines, or both.  Lifestyle changes to help manage hypertension include losing weight, eating a healthy, low-sodium diet, exercising more, stopping smoking, and limiting alcohol. This information is not intended to replace advice given to you by your health care provider. Make sure you discuss any questions you have with your health care provider. Document Revised: 01/05/2020 Document Reviewed: 10/31/2019 Elsevier Patient Education  2021 [employer].

## 2021-04-28 NOTE — Progress Notes (Signed)
Established Patient Office Visit  Subjective:  Patient ID: Marcus King, male    DOB: [birth year]  Age: 80 y.o. MRN: [id number]  CC:  Chief Complaint  Patient presents with  . Hypertension    4 week follow up    HPI Marcus King presents for follow-up of hypertension. He is accompanied by his spouse who helps supplement history. He was referred to Dr Otho Perl, cardiologist for uncontrolled hypertension. BP today in office 110/60, pulse 56.Dr Otho Perl had d/c carvedilol and metoprolol, prescribed Labetalol 300 mg BID. Marcus King states that he has changed his antihypertensive medications on his own once again without the recommendation of a medical provider. States his current regimen includes: Labetalol 300 mg QD rather than BID per Dr Otho Perl recommendation, Lisinopril 20 mg BID, Amlodipine 5 mg QPM, and Carvedilol 25 mg BID. He states he takes Carvedilol at 7 a.m. and 3 p.m.States he feels fatigued, off balance, and that feels "hung-over". Denies falling. Spouse states she has told him not to go upstairs at their home due to fear of him falling. States he goes upstairs without assistance when she is not home.   Counseling provided for pt and spouse concerning side effects of two beta blockers and bradycardia. Pt encouraged to not stop cardiac medications without consulting medical professionals. Verbalized understanding.   Marcus King has a rash that is being treated by dermatology. He states that he is scheduled to return to dermatology to begin Dupixent injections. He states that is concerned about side effects. Rash has been present to anterior/posterior torso and bilateral upper extremities.   Past Medical History:  Diagnosis Date  . Anxiety   . Cervical disc disorder with myelopathy, cervicothoracic region   . Chronic pain of left knee 08/11/2016  . 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 Dyslipidemia  08/11/2017  . Dyspnea on exertion 04/23/2020  . Edema extremities 04/14/2018  . Elevated glucose 10/17/2019  . Fatigue 04/23/2020  . Food poisoning   . GAD (generalized anxiety disorder)   . Headache    sinus  . History of pulmonary embolism 06/13/2020  . History of pulmonary embolus (PE) 06/13/2020  . Hypertension   . Hypertensive heart disease 08/05/2017  . Hypoxia 10/17/2019  . Inguinal lymphadenopathy 05/26/2020  . LVH (left ventricular hypertrophy) due to hypertensive disease 08/11/2017  . Malignant neoplasm of prostate ([place]) 09/09/2020  . Mixed hyperlipidemia   . Orthostatic hypotension   . Orthostatic syncope 10/17/2019  . OSA on CPAP   . Prostate cancer ([place])   . Pulmonary embolism ([place]) 04/2020  . PVC's (premature ventricular contractions) 05/18/2018  . Rectal mass 05/24/2020  . 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 Solid nodule of lung greater than 8 mm in diameter 05/26/2020    Past Surgical History:  Procedure Laterality Date  . CARDIAC CATHETERIZATION    . CATARACT EXTRACTION W/ INTRAOCULAR LENS  IMPLANT, BILATERAL Bilateral   . COLON SURGERY  2014  . COLOSTOMY REVERSAL  2014  . HERNIA REPAIR     x2  . JOINT REPLACEMENT    . PROSTATE SURGERY  2006  . SEPTOPLASTY    . TOTAL KNEE ARTHROPLASTY Left 09/28/2016   Procedure: TOTAL KNEE ARTHROPLASTY;  Surgeon: [name], MD;  Location: [facility];  Service: Orthopedics;  Laterality: Left;    Family History  Problem Relation Age of Onset  . Stroke Mother   .  Parkinson's disease Mother   . Lung disease Father   . Stroke Father   . Alcoholism Father   . Leukemia Sister   . Muscular dystrophy Son        Immunologist  . Alzheimer's disease Neg Hx     Social History   Socioeconomic History  . Marital status: Married    Spouse name: Not on file  . Number of children: 2  . Years of education: Not on file  . Highest education level: High school graduate  Occupational History  . Occupation: Environmental education officer    Comment: ICDs/Wound Care  Tobacco Use  . Smoking status: Former Smoker    Years: 20.00    Types: Cigarettes    Quit date: 1979    Years since quitting: 43.4  . Smokeless tobacco: Never Used  . Tobacco comment: quit 37 years ago  Vaping Use  . Vaping Use: Never used  Substance and Sexual Activity  . Alcohol use: No  . Drug use: No  . Sexual activity: Not on file  Other Topics Concern  . Not on file  Social History Narrative   LIves at home with his wife   Self employed   Right handed   Drinks about 2 cups of caffeine daily   Social Determinants of Health   Financial Resource Strain: Not on file  Food Insecurity: No Food Insecurity  . Worried About Charity fundraiser in the Last Year: Never true  . Ran Out of Food in the Last Year: Never true  Transportation Needs: Not on file  Physical Activity: Not on file  Stress: Not on file  Social Connections: Not on file  Intimate Partner Violence: Not on file    Outpatient Medications Prior to Visit  Medication Sig Dispense Refill  . amLODipine (NORVASC) 5 MG tablet Take 1 tablet (5 mg total) by mouth daily. 90 tablet 0  . apixaban (ELIQUIS) 5 MG TABS tablet Take 1 tablet (5 mg total) by mouth in the morning and at bedtime. 60 tablet 3  . augmented betamethasone dipropionate (DIPROLENE) 0.05 % ointment Apply topically 2 (two) times daily. 100 g 1  . busPIRone (BUSPAR) 30 MG tablet Take 1 tablet (30 mg total) by mouth 2 (two) times daily. 60 tablet 3  . darolutamide (NUBEQA) 300 MG tablet Take 1 tablet (300 mg total) by mouth daily. 3 days each week 120 tablet 3  . furosemide (LASIX) 20 MG tablet take 1 tablet (20 mg) by oral route once daily 90 tablet 1  . hydrOXYzine (ATARAX/VISTARIL) 25 MG tablet Take 1 tablet (25 mg total) by mouth every 8 (eight) hours as needed. 270 tablet 1  . labetalol (NORMODYNE) 300 MG tablet Take 300 mg by mouth 2 (two) times daily.    [name] Leuprolide Acetate, 3 Month, (LUPRON DEPOT, 17-MONTH,  IM) Inject into the muscle.    . lisinopril (ZESTRIL) 20 MG tablet Take 1 tablet (20 mg total) by mouth daily. 90 tablet 1  . PARoxetine (PAXIL) 40 MG tablet Take 1 tablet (40 mg total) by mouth daily. 90 tablet 0  . pravastatin (PRAVACHOL) 20 MG tablet Take 1 tablet (20 mg total) by mouth daily. 90 tablet 1  . triamcinolone cream (KENALOG) 0.1 % Apply topically 2 (two) times daily as needed.    . carvedilol (COREG) 25 MG tablet 25 mg daily.    [name] ALPRAZolam (XANAX) 0.5 MG tablet Take 1 tablet (0.5 mg total) by mouth 3 (three) times daily as  needed for anxiety. (Patient not taking: Reported on 03/26/2021) 90 tablet 0  . loratadine (CLARITIN) 10 MG tablet Take by mouth.    . gabapentin (NEURONTIN) 100 MG capsule Take 1 capsule (100 mg total) by mouth 3 (three) times daily. 90 capsule 1  . Leuprolide Acetate 5 MG/ML KIT Inject into the skin.    . metoprolol tartrate (LOPRESSOR) 50 MG tablet Take 1 tablet (50 mg total) by mouth 2 (two) times daily. 180 tablet 3  . predniSONE (DELTASONE) 2.5 MG tablet Take 2.5 mg by mouth daily.     No facility-administered medications prior to visit.    Allergies  Allergen Reactions  . Beef-Derived Products Diarrhea  . Clonazepam Other (See Comments)    High doses cause drunk feeling (>1 mg)  . Monosodium Glutamate Other (See Comments)    MSG (soups and oriental foods)  . Pegademase Bovine Diarrhea    ROS Review of Systems  Constitutional: Positive for diaphoresis (related to Lupron) and fatigue.  HENT: Negative.   Eyes: Negative.   Respiratory: Negative.   Cardiovascular: Positive for leg swelling (mild pedal edema).  Gastrointestinal: Negative.   Endocrine: Positive for heat intolerance. Negative for cold intolerance, polydipsia, polyphagia and polyuria.       Takes Lupron injections every 30 months   Genitourinary: Positive for difficulty urinating.       Incontinence since total prostatectomy   Musculoskeletal: Negative for arthralgias and back  pain.  Skin: Positive for rash (chest/torso, bilateral upper extremities).  Allergic/Immunologic: Positive for immunocompromised state.  Neurological: Positive for weakness and light-headedness.  Hematological: Bruises/bleeds easily (on blood thinner).  Psychiatric/Behavioral: Negative.       Objective:    Physical Exam Vitals reviewed.  Constitutional:      Appearance: Normal appearance.  HENT:     Head: Normocephalic.     Right Ear: Tympanic membrane normal.     Left Ear: Tympanic membrane normal.     Mouth/Throat:     Mouth: Mucous membranes are moist.  Eyes:     Comments: Glasses in place  Cardiovascular:     Rate and Rhythm: Normal rate and regular rhythm.     Pulses: Normal pulses.     Heart sounds: Normal heart sounds.  Pulmonary:     Effort: Pulmonary effort is normal.     Breath sounds: Normal breath sounds.  Abdominal:     General: Bowel sounds are normal.     Palpations: Abdomen is soft.  Skin:    General: Skin is warm and dry.     Capillary Refill: Capillary refill takes less than 2 seconds.     Findings: Rash (bilateral upper extremities, anterior/posterior torso) present.  Neurological:     General: No focal deficit present.     Mental Status: He is alert and oriented to person, place, and time.  Psychiatric:        Mood and Affect: Mood normal.        Behavior: Behavior normal.     BP 110/60 (BP Location: Left Arm, Patient Position: Sitting)   Pulse (!) 56   Temp (!) 97.4 F (36.3 C) (Temporal)   Ht '5\' 10"'  (1.778 m)   Wt 195 lb (88.5 kg)   SpO2 95%   BMI 27.98 kg/m  Wt Readings from Last 3 Encounters:  04/28/21 195 lb (88.5 kg)  03/28/21 194 lb 11.2 oz (88.3 kg)  03/26/21 194 lb (88 kg)     Health Maintenance Due  Topic Date Due  .  TETANUS/TDAP  12/12/2019  . COVID-19 Vaccine (2 - Janssen risk 3-dose series) 04/09/2020     Lab Results  Component Value Date   TSH 1.300 01/07/2021   Lab Results  Component Value Date   WBC 8.9  03/27/2021   HGB 13.5 03/27/2021   HCT 39 (A) 03/27/2021   MCV 90 03/27/2021   PLT 274 03/27/2021   Lab Results  Component Value Date   NA 140 03/27/2021   K 3.9 03/27/2021   CO2 26 (A) 03/27/2021   GLUCOSE 91 03/07/2021   BUN 26 (A) 03/27/2021   CREATININE 1.0 03/27/2021   BILITOT 0.6 03/07/2021   ALKPHOS 80 03/27/2021   AST 28 03/27/2021   ALT 21 03/27/2021   PROT 7.0 03/07/2021   ALBUMIN 4.1 03/27/2021   CALCIUM 9.0 03/27/2021   ANIONGAP 9 01/09/2019   EGFR 66 03/07/2021   Lab Results  Component Value Date   CHOL 178 03/07/2021   Lab Results  Component Value Date   HDL 54 03/07/2021   Lab Results  Component Value Date   LDLCALC 106 (H) 03/07/2021   Lab Results  Component Value Date   TRIG 102 03/07/2021   Lab Results  Component Value Date   CHOLHDL 3.3 03/07/2021   Lab Results  Component Value Date   HGBA1C 5.6 12/01/2017      Assessment & Plan:   1. Hypertensive heart disease with chronic diastolic congestive heart failure (HCC) - Comprehensive metabolic panel -Discontinue Carvedilol 25 mg BID -Resume Labetalol 300 mg BID -Monitor BP and pulse at home -Follow-up with Dr Otho Perl, cardiologist as scheduled -Seek emergency medical care for any serious health concerns/problems  2. Rash -Follow-up with Dermatology as scheduled -Continue topical cream as prescribed    Problem List Items Addressed This Visit      Cardiovascular and Mediastinum   Hypertensive heart disease - Primary   Relevant Medications   labetalol (NORMODYNE) 300 MG tablet    Take medications as prescribed Stop carvedilol as ordered per Dr Otho Perl Follow-up with Dr Otho Perl as prescribed We will call you with lab results Follow-up with Dr Tobie Poet in 104-month, fasting     Follow-up: 363-monthfasting with Dr CoTobie Poet Signed, ShRip HarbourNP

## 2021-04-30 DIAGNOSIS — Z7901 Long term (current) use of anticoagulants: Secondary | ICD-10-CM | POA: Diagnosis not present

## 2021-04-30 DIAGNOSIS — Z86718 Personal history of other venous thrombosis and embolism: Secondary | ICD-10-CM | POA: Diagnosis not present

## 2021-04-30 DIAGNOSIS — L209 Atopic dermatitis, unspecified: Secondary | ICD-10-CM | POA: Diagnosis not present

## 2021-04-30 DIAGNOSIS — I10 Essential (primary) hypertension: Secondary | ICD-10-CM | POA: Diagnosis not present

## 2021-04-30 DIAGNOSIS — L299 Pruritus, unspecified: Secondary | ICD-10-CM | POA: Diagnosis not present

## 2021-05-07 ENCOUNTER — Telehealth: Payer: Medicare Other

## 2021-05-14 DIAGNOSIS — L209 Atopic dermatitis, unspecified: Secondary | ICD-10-CM | POA: Diagnosis not present

## 2021-05-14 DIAGNOSIS — L299 Pruritus, unspecified: Secondary | ICD-10-CM | POA: Diagnosis not present

## 2021-05-18 ENCOUNTER — Other Ambulatory Visit: Payer: Self-pay | Admitting: Family Medicine

## 2021-05-27 DIAGNOSIS — L209 Atopic dermatitis, unspecified: Secondary | ICD-10-CM | POA: Diagnosis not present

## 2021-05-30 DIAGNOSIS — I10 Essential (primary) hypertension: Secondary | ICD-10-CM | POA: Diagnosis not present

## 2021-05-30 DIAGNOSIS — Z86718 Personal history of other venous thrombosis and embolism: Secondary | ICD-10-CM | POA: Diagnosis not present

## 2021-05-30 DIAGNOSIS — Z7901 Long term (current) use of anticoagulants: Secondary | ICD-10-CM | POA: Diagnosis not present

## 2021-05-30 DIAGNOSIS — R0600 Dyspnea, unspecified: Secondary | ICD-10-CM | POA: Diagnosis not present

## 2021-06-06 ENCOUNTER — Other Ambulatory Visit: Payer: Self-pay | Admitting: Physician Assistant

## 2021-06-06 DIAGNOSIS — F419 Anxiety disorder, unspecified: Secondary | ICD-10-CM

## 2021-06-11 ENCOUNTER — Ambulatory Visit: Payer: Medicare Other | Admitting: Family Medicine

## 2021-06-16 ENCOUNTER — Other Ambulatory Visit: Payer: Self-pay | Admitting: Family Medicine

## 2021-06-18 ENCOUNTER — Other Ambulatory Visit: Payer: Self-pay | Admitting: Family Medicine

## 2021-06-19 DIAGNOSIS — Z20822 Contact with and (suspected) exposure to covid-19: Secondary | ICD-10-CM | POA: Diagnosis not present

## 2021-06-23 DIAGNOSIS — L209 Atopic dermatitis, unspecified: Secondary | ICD-10-CM | POA: Diagnosis not present

## 2021-06-23 DIAGNOSIS — L299 Pruritus, unspecified: Secondary | ICD-10-CM | POA: Diagnosis not present

## 2021-06-25 NOTE — Progress Notes (Signed)
[facility]  [address] [place],  [place]  [address] [phone]  Clinic Day:  06/27/2021  Referring physician: Rochel Brome, MD  This document serves as a record of services personally performed by Marice Potter, MD. It was created on their behalf by [facility], a trained medical scribe. The creation of this record is based on the scribe's personal observations and the provider's statements to them.  HISTORY OF PRESENT ILLNESS:  The patient is a 80 y.o. 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  There was also evidence of metastatic disease studded along his anal canal.  He is currently on complete androgen deprivation therapy, which consists of Lupron/darolutamide.  As it pertains to his prostate cancer management, he denies having any side effects from his complete androgen blockade therapy.  He also denies having any new symptoms or findings that concern him for progression his metastatic prostate cancer.  PHYSICAL EXAM:  Blood pressure 140/70, pulse (!) 59, temperature 98.2 F (36.8 C), resp. rate 16, height 5\' 10"  (1.778 m), weight 196 lb 8 oz (89.1 kg), SpO2 95 %. Wt Readings from Last 3 Encounters:  06/27/21 196 lb 8 oz (89.1 kg)  06/27/21 196 lb 8 oz (89.1 kg)  04/28/21 195 lb (88.5 kg)   Body mass index is 28.19 kg/m. Performance status (ECOG): 1 Physical Exam Constitutional:      Appearance: Normal appearance. He is not ill-appearing.  HENT:     Mouth/Throat:     Mouth: Mucous membranes are moist.     Pharynx: Oropharynx is clear. No oropharyngeal exudate or posterior oropharyngeal erythema.  Cardiovascular:     Rate and Rhythm: Normal rate and regular rhythm.     Heart sounds: No murmur heard.   No friction rub. No gallop.  Pulmonary:     Effort: Pulmonary effort is normal. No respiratory distress.     Breath sounds: Normal breath sounds. No wheezing, rhonchi  or rales.  Chest:  Breasts:    Right: No axillary adenopathy or supraclavicular adenopathy.     Left: No axillary adenopathy or supraclavicular adenopathy.  Abdominal:     General: Bowel sounds are normal. There is no distension.     Palpations: Abdomen is soft. There is no mass.     Tenderness: There is no abdominal tenderness.  Musculoskeletal:        General: No swelling.     Right lower leg: No edema.     Left lower leg: No edema.  Lymphadenopathy:     Cervical: No cervical adenopathy.     Upper Body:     Right upper body: No supraclavicular or axillary adenopathy.     Left upper body: No supraclavicular or axillary adenopathy.     Lower Body: No right inguinal adenopathy. No left inguinal adenopathy.  Skin:    General: Skin is warm.     Coloration: Skin is not jaundiced.     Findings: No lesion. Rash: near complete resolution of the rash over his chest/abdomen. Neurological:     General: No focal deficit present.     Mental Status: He is oriented to person, place, and time. Mental status is at baseline.     Cranial Nerves: Cranial nerves are intact.  Psychiatric:        Mood and Affect: Mood normal.        Behavior: Behavior normal.        Thought Content:  Thought content normal.    LABS:   CBC Latest Ref Rng & Units 06/26/2021 03/27/2021 03/07/2021  WBC - 7.1 8.9 8.6  Hemoglobin 13.5 - 17.5 14.2 13.5 14.9  Hematocrit 41 - 53 42 39(A) 44.3  Platelets 150 - 399 314 274 337   CMP Latest Ref Rng & Units 06/26/2021 04/28/2021 03/27/2021  Glucose 65 - 99 mg/dL - 91 -  BUN 4 - 21 31(A) 16 26(A)  Creatinine 0.6 - 1.3 1.2 1.23 1.0  Sodium 137 - 147 137 143 140  Potassium 3.4 - 5.3 4.0 4.1 3.9  Chloride 99 - 108 102 104 105  CO2 13 - 22 23(A) 22 26(A)  Calcium 8.7 - 10.7 9.5 10.0 9.0  Total Protein 6.0 - 8.5 g/dL - 6.8 -  Total Bilirubin 0.0 - 1.2 mg/dL - 0.4 -  Alkaline Phos 25 - 125 84 85 80  AST 14 - 40 29 22 28   ALT 10 - 40 20 15 21     Lab Results  Component Value  Date   PSA1 0.2 06/26/2021    Ref. Range 01/20/2021 11:20 01/21/2021 00:00 02/21/2021 00:00 02/21/2021 10:47  Prostate Specific Ag, Serum Latest Ref Range: 0.0 - 4.0 ng/mL 0.2   0.2   ASSESSMENT & PLAN:  Assessment/Plan:  An 80 y.o. male with metastatic prostate cancer.  His PSA level remains stable at 0.2.  This suggests that his combination of Lupron /darolutamide is keeping his disease under ideal control.  His current darolutamide dose is 300 mg every Monday, Wednesday, and Friday.   He will continue to receive Lupron injections every 3 months.  As his disease remains under control and he is clinically doing well, I will see him back in another 3 months for repeat clinical assessment.  The patient understands all the plans discussed today and is in agreement with them.     I, Rita Ohara, am acting as scribe for Marice Potter, MD    I have reviewed this report as typed by the medical scribe, and it is complete and accurate.  Shekira Drummer Macarthur Critchley, MD

## 2021-06-26 ENCOUNTER — Other Ambulatory Visit: Payer: Self-pay

## 2021-06-26 ENCOUNTER — Encounter: Payer: Self-pay | Admitting: Hematology and Oncology

## 2021-06-26 ENCOUNTER — Inpatient Hospital Stay: Payer: Medicare Other | Attending: Oncology

## 2021-06-26 DIAGNOSIS — Z86711 Personal history of pulmonary embolism: Secondary | ICD-10-CM | POA: Diagnosis not present

## 2021-06-26 DIAGNOSIS — C61 Malignant neoplasm of prostate: Secondary | ICD-10-CM | POA: Insufficient documentation

## 2021-06-26 DIAGNOSIS — Z7901 Long term (current) use of anticoagulants: Secondary | ICD-10-CM | POA: Diagnosis not present

## 2021-06-26 LAB — BASIC METABOLIC PANEL
BUN: 31 — AB (ref 4–21)
CO2: 23 — AB (ref 13–22)
Chloride: 102 (ref 99–108)
Creatinine: 1.2 (ref 0.6–1.3)
Glucose: 98
Potassium: 4 (ref 3.4–5.3)
Sodium: 137 (ref 137–147)

## 2021-06-26 LAB — CBC: RBC: 4.73 (ref 3.87–5.11)

## 2021-06-26 LAB — CBC AND DIFFERENTIAL
HCT: 42 (ref 41–53)
Hemoglobin: 14.2 (ref 13.5–17.5)
Neutrophils Absolute: 4.12
Platelets: 314 (ref 150–399)
WBC: 7.1

## 2021-06-26 LAB — HEPATIC FUNCTION PANEL
ALT: 20 (ref 10–40)
AST: 29 (ref 14–40)
Alkaline Phosphatase: 84 (ref 25–125)
Bilirubin, Total: 0.6

## 2021-06-26 LAB — COMPREHENSIVE METABOLIC PANEL
Albumin: 4.5 (ref 3.5–5.0)
Calcium: 9.5 (ref 8.7–10.7)

## 2021-06-27 ENCOUNTER — Telehealth: Payer: Self-pay | Admitting: Oncology

## 2021-06-27 ENCOUNTER — Inpatient Hospital Stay (INDEPENDENT_AMBULATORY_CARE_PROVIDER_SITE_OTHER): Payer: Medicare Other | Admitting: Oncology

## 2021-06-27 ENCOUNTER — Inpatient Hospital Stay: Payer: Medicare Other

## 2021-06-27 ENCOUNTER — Other Ambulatory Visit: Payer: Self-pay | Admitting: Pharmacist

## 2021-06-27 ENCOUNTER — Other Ambulatory Visit: Payer: Self-pay | Admitting: Oncology

## 2021-06-27 VITALS — BP 109/68 | HR 62 | Temp 98.2°F | Resp 18 | Ht 70.0 in | Wt 196.5 lb

## 2021-06-27 VITALS — BP 140/70 | HR 59 | Temp 98.2°F | Resp 16 | Ht 70.0 in | Wt 196.5 lb

## 2021-06-27 DIAGNOSIS — C61 Malignant neoplasm of prostate: Secondary | ICD-10-CM

## 2021-06-27 DIAGNOSIS — Z86711 Personal history of pulmonary embolism: Secondary | ICD-10-CM | POA: Diagnosis not present

## 2021-06-27 DIAGNOSIS — Z7901 Long term (current) use of anticoagulants: Secondary | ICD-10-CM | POA: Diagnosis not present

## 2021-06-27 LAB — PROSTATE-SPECIFIC AG, SERUM (LABCORP): Prostate Specific Ag, Serum: 0.2 ng/mL (ref 0.0–4.0)

## 2021-06-27 MED ORDER — LEUPROLIDE ACETATE (3 MONTH) 22.5 MG IM KIT
22.5000 mg | PACK | Freq: Once | INTRAMUSCULAR | Status: AC
  Administered 2021-06-27: 22.5 mg via INTRAMUSCULAR

## 2021-06-27 MED ORDER — LEUPROLIDE ACETATE (3 MONTH) 22.5 MG IM KIT
PACK | INTRAMUSCULAR | Status: AC
  Filled 2021-06-27: qty 22.5

## 2021-06-27 NOTE — Telephone Encounter (Signed)
Per 7/15 LOS, patient scheduled for Oct Appt's.  Gave patient Appt Summary

## 2021-06-27 NOTE — Patient Instructions (Signed)
Leuprolide injection What is this medication? LEUPROLIDE (loo PROE lide) is a man-made hormone. It is used to treat the symptoms of prostate cancer. This medicine may also be used to treat childrenwith early onset of puberty. It may be used for other hormonal conditions. This medicine may be used for other purposes; ask your health care provider orpharmacist if you have questions. COMMON BRAND NAME(S): Lupron What should I tell my care team before I take this medication? They need to know if you have any of these conditions: diabetes heart disease or previous heart attack high blood pressure high cholesterol pain or difficulty passing urine spinal cord metastasis stroke tobacco smoker an unusual or allergic reaction to leuprolide, benzyl alcohol, other medicines, foods, dyes, or preservatives pregnant or trying to get pregnant breast-feeding How should I use this medication? This medicine is for injection under the skin or into a muscle. You will be taught how to prepare and give this medicine. Use exactly as directed. Take your medicine at regular intervals. Do not take your medicine more often thandirected. It is important that you put your used needles and syringes in a special sharps container. Do not put them in a trash can. If you do not have a sharpscontainer, call your pharmacist or healthcare provider to get one. A special MedGuide will be given to you by the pharmacist with eachprescription and refill. Be sure to read this information carefully each time. Talk to your pediatrician regarding the use of this medicine in children. While this medicine may be prescribed for children as young as 8 years for selectedconditions, precautions do apply. Overdosage: If you think you have taken too much of this medicine contact apoison control center or emergency room at once. NOTE: This medicine is only for you. Do not share this medicine with others. What if I miss a dose? If you miss a  dose, take it as soon as you can. If it is almost time for yournext dose, take only that dose. Do not take double or extra doses. What may interact with this medication? Do not take this medicine with any of the following medications: chasteberry cisapride dronedarone pimozide thioridazine This medicine may also interact with the following medications: herbal or dietary supplements, like black cohosh or DHEA male hormones, like estrogens or progestins and birth control pills, patches, rings, or injections male hormones, like testosterone other medicines that prolong the QT interval (abnormal heart rhythm) This list may not describe all possible interactions. Give your health care provider a list of all the medicines, herbs, non-prescription drugs, or dietary supplements you use. Also tell them if you smoke, drink alcohol, or use illegaldrugs. Some items may interact with your medicine. What should I watch for while using this medication? Visit your doctor or health care professional for regular checks on your progress. During the first week, your symptoms may get worse, but then will improve as you continue your treatment. You may get hot flashes, increased bone pain, increased difficulty passing urine, or an aggravation of nerve symptoms. Discuss these effects with your doctor or health care professional, some ofthem may improve with continued use of this medicine. Male patients may experience a menstrual cycle or spotting during the first 2 months of therapy with this medicine. If this continues, contact your doctor orhealth care professional. This medicine may increase blood sugar. Ask your healthcare provider if changesin diet or medicines are needed if you have diabetes. What side effects may I notice from receiving this medication? Side   effects that you should report to your doctor or health care professionalas soon as possible: allergic reactions like skin rash, itching or hives,  swelling of the face, lips, or tongue breathing problems chest pain depression or memory disorders pain in your legs or groin pain at site where injected severe headache signs and symptoms of high blood sugar such as being more thirsty or hungry or having to urinate more than normal. You may also feel very tired or have blurry vision swelling of the feet and legs visual changes vomiting Side effects that usually do not require medical attention (report to yourdoctor or health care professional if they continue or are bothersome): breast swelling or tenderness decrease in sex drive or performance diarrhea hot flashes loss of appetite muscle, joint, or bone pains nausea redness or irritation at site where injected skin problems or acne This list may not describe all possible side effects. Call your doctor for medical advice about side effects. You may report side effects to FDA at1-800-FDA-1088. Where should I keep my medication? Keep out of the reach of children. Store below 25 degrees C (77 degrees F). Do not freeze. Protect from light. Do not use if it is not clear or if there are particles present. Throw away anyunused medicine after the expiration date. NOTE: This sheet is a summary. It may not cover all possible information. If you have questions about this medicine, talk to your doctor, pharmacist, orhealth care provider.  2022 Elsevier/Gold Standard (2019-11-01 10:57:41)  

## 2021-07-05 ENCOUNTER — Other Ambulatory Visit: Payer: Self-pay | Admitting: Family Medicine

## 2021-07-06 ENCOUNTER — Other Ambulatory Visit: Payer: Self-pay | Admitting: Family Medicine

## 2021-07-06 DIAGNOSIS — I110 Hypertensive heart disease with heart failure: Secondary | ICD-10-CM

## 2021-07-09 DIAGNOSIS — I10 Essential (primary) hypertension: Secondary | ICD-10-CM | POA: Diagnosis not present

## 2021-07-09 DIAGNOSIS — R0600 Dyspnea, unspecified: Secondary | ICD-10-CM | POA: Diagnosis not present

## 2021-07-17 ENCOUNTER — Other Ambulatory Visit: Payer: Self-pay | Admitting: Hematology and Oncology

## 2021-07-17 ENCOUNTER — Other Ambulatory Visit: Payer: Self-pay

## 2021-07-17 DIAGNOSIS — I2782 Chronic pulmonary embolism: Secondary | ICD-10-CM

## 2021-07-17 MED ORDER — APIXABAN 5 MG PO TABS: 5.0000 mg | ORAL_TABLET | Freq: Two times a day (BID) | ORAL | 3 refills | Status: DC

## 2021-07-18 ENCOUNTER — Encounter: Payer: Self-pay | Admitting: Oncology

## 2021-07-21 DIAGNOSIS — L209 Atopic dermatitis, unspecified: Secondary | ICD-10-CM | POA: Diagnosis not present

## 2021-07-24 ENCOUNTER — Other Ambulatory Visit: Payer: Self-pay | Admitting: Physician Assistant

## 2021-07-30 ENCOUNTER — Ambulatory Visit (INDEPENDENT_AMBULATORY_CARE_PROVIDER_SITE_OTHER): Payer: Medicare Other | Admitting: Adult Health

## 2021-07-30 ENCOUNTER — Encounter: Payer: Self-pay | Admitting: Adult Health

## 2021-07-30 ENCOUNTER — Ambulatory Visit: Payer: Medicare Other | Admitting: Family Medicine

## 2021-07-30 VITALS — BP 159/77 | HR 56 | Ht 70.0 in | Wt 199.0 lb

## 2021-07-30 DIAGNOSIS — G4733 Obstructive sleep apnea (adult) (pediatric): Secondary | ICD-10-CM | POA: Diagnosis not present

## 2021-07-30 NOTE — Progress Notes (Signed)
Order for mask refitting faxed to DME: [facility] @ [phone]. Received a receipt of confirmation.

## 2021-07-30 NOTE — Progress Notes (Signed)
PATIENT: Marcus King DOB: 28-May-1941  REASON FOR VISIT: follow up HISTORY FROM: patient Primary Neurologist: Dr. Brett Fairy  HISTORY OF PRESENT ILLNESS: Today 07/30/21:  Marcus King is an 80 year old male with a history of obstructive sleep apnea on BiPAP.  His download indicates that he uses machine nightly for compliance of 100%.  He uses machine greater than 4 hours 29 out of 30 days for compliance of 96.7%.  His residual AHI is 4.1 on 14/18 centimeters of water.  He reports that the machine works well.  He states that his mask is so tight that it leaves indentions on his face.  He would like to try a different style if possible.  07/29/20: Marcus King is an 80 year old male with a history of obstructive sleep apnea on BiPAP.  He returns today for follow-up.  His download indicates that he use his machine nightly for compliance of 100%.  He uses machine greater than 4 hours each night.  On average he uses his machine 8 hours and 20 minutes.  His residual AHI is 6.3 on 18/14 centimeters of water.  He reports that he does not like his mask.  And would like to try different one.  He reports that since the last visit he was put on supplemental O2 at night, Eliquis due to blood thinners and diagnosed with prostate cancer.  He returns today for an evaluation.   REVIEW OF SYSTEMS: Out of a complete 14 system review of symptoms, the patient complains only of the following symptoms, and all other reviewed systems are negative.   ESS 7  ALLERGIES: Allergies  Allergen Reactions   Beef-Derived Products Diarrhea   Clonazepam Other (See Comments)    High doses cause drunk feeling (>1 mg)   Monosodium Glutamate Other (See Comments)    MSG (soups and oriental foods)   Pegademase Bovine Diarrhea    HOME MEDICATIONS: Outpatient Medications Prior to Visit  Medication Sig Dispense Refill   ALPRAZolam (XANAX) 0.5 MG tablet Take 1 tablet (0.5 mg total) by mouth 3 (three) times daily as needed for  anxiety. (Patient not taking: Reported on 03/26/2021) 90 tablet 0   amLODipine (NORVASC) 5 MG tablet Take 1 tablet (5 mg total) by mouth daily. 90 tablet 0   apixaban (ELIQUIS) 5 MG TABS tablet Take 1 tablet (5 mg total) by mouth in the morning and at bedtime. 180 tablet 3   augmented betamethasone dipropionate (DIPROLENE) 0.05 % ointment Apply topically 2 (two) times daily. 100 g 1   busPIRone (BUSPAR) 30 MG tablet Take 1 tablet (30 mg total) by mouth 2 (two) times daily. 60 tablet 3   darolutamide (NUBEQA) 300 MG tablet Take 1 tablet (300 mg total) by mouth daily. 3 days each week 120 tablet 3   furosemide (LASIX) 20 MG tablet take 1 tablet (20 mg) by oral route once daily 90 tablet 1   hydrOXYzine (ATARAX/VISTARIL) 25 MG tablet Take 1 tablet (25 mg total) by mouth every 8 (eight) hours as needed. 270 tablet 1   labetalol (NORMODYNE) 300 MG tablet Take 1 tablet (300 mg total) by mouth 2 (two) times daily. 180 tablet 1   Leuprolide Acetate, 3 Month, (LUPRON DEPOT, 2-MONTH, IM) Inject into the muscle.     lisinopril (ZESTRIL) 20 MG tablet Take 1 tablet (20 mg total) by mouth daily. 90 tablet 1   loratadine (CLARITIN) 10 MG tablet Take by mouth.     PARoxetine (PAXIL) 40 MG tablet Take 1 tablet (40  mg total) by mouth daily. 90 tablet 0   pravastatin (PRAVACHOL) 20 MG tablet Take 1 tablet (20 mg total) by mouth daily. 90 tablet 1   triamcinolone cream (KENALOG) 0.1 % Apply topically 2 (two) times daily as needed.     No facility-administered medications prior to visit.    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, NYHA class 1 ([place]) 09/21/2018   Dyslipidemia 08/11/2017   Dyspnea on exertion 04/23/2020   Edema extremities 04/14/2018   Elevated glucose 10/17/2019    Fatigue 04/23/2020   Food poisoning    GAD (generalized anxiety disorder)    Headache    sinus   History of pulmonary embolism 06/13/2020   History of pulmonary embolus (PE) 06/13/2020   Hypertension    Hypertensive heart disease 08/05/2017   Hypoxia 10/17/2019   Inguinal lymphadenopathy 05/26/2020   LVH (left ventricular hypertrophy) due to hypertensive disease 08/11/2017   Malignant neoplasm of prostate ([place]) 09/09/2020   Mixed hyperlipidemia    Orthostatic hypotension    Orthostatic syncope 10/17/2019   OSA on CPAP    Prostate cancer ([place])    Pulmonary embolism ([place]) 04/2020   PVC's (premature ventricular contractions) 05/18/2018   Rectal mass 05/24/2020   S/P TKR (total knee replacement) using cement, 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   Solid nodule of lung greater than 8 mm in diameter 05/26/2020    PAST SURGICAL HISTORY: Past Surgical History:  Procedure Laterality Date   CARDIAC CATHETERIZATION     CATARACT EXTRACTION W/ INTRAOCULAR LENS  IMPLANT, BILATERAL Bilateral    COLON SURGERY  2014   COLOSTOMY REVERSAL  2014   HERNIA REPAIR     x2   JOINT REPLACEMENT     PROSTATE SURGERY  2006   SEPTOPLASTY     TOTAL KNEE ARTHROPLASTY Left 09/28/2016   Procedure: TOTAL KNEE ARTHROPLASTY;  Surgeon: [name], MD;  Location: [facility];  Service: Orthopedics;  Laterality: Left;    FAMILY HISTORY: Family History  Problem Relation Age of Onset   Stroke Mother    Parkinson's disease Mother    Lung disease Father    Stroke Father    Alcoholism Father    Leukemia Sister    Muscular dystrophy Son        Becker's   Alzheimer's disease Neg Hx     SOCIAL HISTORY: Social History   Socioeconomic History   Marital status: Married    Spouse name: Not on file   Number of children: 2   Years of education: Not on file   Highest education level: High school graduate  Occupational History   Occupation: Engineer, petroleum    Comment: ICDs/Wound Care  Tobacco Use   Smoking status: Former    Years: 20.00     Types: Cigarettes    Quit date: 1979    Years since quitting: 43.6   Smokeless tobacco: Never   Tobacco comments:    quit 37 years ago  Vaping Use   Vaping Use: Never used  Substance and Sexual Activity   Alcohol use: No   Drug use: No   Sexual activity: Not on file  Other Topics Concern   Not on file  Social History Narrative   LIves at home with his wife   Self employed  Right handed   Drinks about 2 cups of caffeine daily   Social Determinants of Health   Financial Resource Strain: Not on file  Food Insecurity: Not on file  Transportation Needs: Not on file  Physical Activity: Not on file  Stress: Not on file  Social Connections: Not on file  Intimate Partner Violence: Not on file      PHYSICAL EXAM  There were no vitals filed for this visit.  There is no height or weight on file to calculate BMI.  Generalized: Well developed, in no acute distress  Chest: Lungs clear to auscultation bilaterally  Neurological examination  Mentation: Alert oriented to time, place, history taking. Follows all commands speech and language fluent Cranial nerve II-XII: Extraocular movements were full, visual field were full on confrontational test Head turning and shoulder shrug  were normal and symmetric. Motor: The motor testing reveals 5 over 5 strength of all 4 extremities. Good symmetric motor tone is noted throughout.  Sensory: Sensory testing is intact to soft touch on all 4 extremities. No evidence of extinction is noted.  Gait and station: Gait is normal.    DIAGNOSTIC DATA (LABS, IMAGING, TESTING) - I reviewed patient records, labs, notes, testing and imaging myself where available.  Lab Results  Component Value Date   WBC 7.1 06/26/2021   HGB 14.2 06/26/2021   HCT 42 06/26/2021   MCV 90 03/27/2021   PLT 314 06/26/2021      Component Value Date/Time   NA 137 06/26/2021 0000   K 4.0 06/26/2021 0000   CL 102 06/26/2021 0000   CO2 23 (A) 06/26/2021 0000    GLUCOSE 91 04/28/2021 1037   GLUCOSE 100 (H) 01/09/2019 2020   BUN 31 (A) 06/26/2021 0000   CREATININE 1.2 06/26/2021 0000   CREATININE 1.23 04/28/2021 1037   CALCIUM 9.5 06/26/2021 0000   PROT 6.8 04/28/2021 1037   ALBUMIN 4.5 06/26/2021 0000   ALBUMIN 4.4 04/28/2021 1037   AST 29 06/26/2021 0000   ALT 20 06/26/2021 0000   ALKPHOS 84 06/26/2021 0000   BILITOT 0.4 04/28/2021 1037   GFRNONAA 67 01/07/2021 1649   GFRAA 77 01/07/2021 1649   Lab Results  Component Value Date   CHOL 178 03/07/2021   HDL 54 03/07/2021   LDLCALC 106 (H) 03/07/2021   TRIG 102 03/07/2021   CHOLHDL 3.3 03/07/2021   Lab Results  Component Value Date   HGBA1C 5.6 12/01/2017   Lab Results  Component Value Date   VITAMINB12 257 12/01/2017   Lab Results  Component Value Date   TSH 1.300 01/07/2021      ASSESSMENT AND PLAN 80 y.o. 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 diastolic dysfunction, NYHA class 1 ([place]) (09/21/2018), Dyslipidemia (08/11/2017), Dyspnea on exertion (04/23/2020), Edema extremities (04/14/2018), Elevated glucose (10/17/2019), Fatigue (04/23/2020), Food poisoning, GAD (generalized anxiety disorder), Headache, History of pulmonary embolism (06/13/2020), History of pulmonary embolus (PE) (06/13/2020), Hypertension, Hypertensive heart disease (08/05/2017), Hypoxia (10/17/2019), Inguinal lymphadenopathy (05/26/2020), LVH (left ventricular hypertrophy) due to hypertensive disease (08/11/2017), Malignant neoplasm of prostate ([place]) (09/09/2020), Mixed hyperlipidemia, Orthostatic hypotension, Orthostatic syncope (10/17/2019), OSA on CPAP, Prostate cancer ([place]), Pulmonary embolism ([place]) (04/2020), PVC's (premature ventricular contractions) (05/18/2018), Rectal mass (05/24/2020), S/P TKR (total knee replacement) using cement, left  (09/28/2016), and Solid nodule of lung greater than 8 mm in diameter (05/26/2020).  here with:  OSA on BiPAP  - BiPAP compliance excellent - Good treatment of AHI  - Encourage patient to use CPAP nightly and > 4 hours each night -Patient will be sent for mask refitting - F/U in 1 year or sooner if needed   I spent 20 minutes of face-to-face and non-face-to-face time with patient.  This included previsit chart review, lab review, study review, order entry, electronic health record documentation, patient education.  Ward Givens, MSN, NP-C 07/30/2021, 11:14 AM [facility] [address], [place], [place] [address] [phone]

## 2021-07-31 ENCOUNTER — Ambulatory Visit (INDEPENDENT_AMBULATORY_CARE_PROVIDER_SITE_OTHER): Payer: Medicare Other | Admitting: Family Medicine

## 2021-07-31 ENCOUNTER — Other Ambulatory Visit: Payer: Self-pay

## 2021-07-31 VITALS — BP 148/74 | HR 68 | Temp 97.0°F | Resp 18 | Ht 70.0 in | Wt 197.0 lb

## 2021-07-31 DIAGNOSIS — I110 Hypertensive heart disease with heart failure: Secondary | ICD-10-CM

## 2021-07-31 DIAGNOSIS — R600 Localized edema: Secondary | ICD-10-CM

## 2021-07-31 DIAGNOSIS — F411 Generalized anxiety disorder: Secondary | ICD-10-CM

## 2021-07-31 DIAGNOSIS — E782 Mixed hyperlipidemia: Secondary | ICD-10-CM | POA: Diagnosis not present

## 2021-07-31 DIAGNOSIS — Z6828 Body mass index (BMI) 28.0-28.9, adult: Secondary | ICD-10-CM | POA: Diagnosis not present

## 2021-07-31 DIAGNOSIS — Z9989 Dependence on other enabling machines and devices: Secondary | ICD-10-CM

## 2021-07-31 DIAGNOSIS — R5383 Other fatigue: Secondary | ICD-10-CM

## 2021-07-31 DIAGNOSIS — I5032 Chronic diastolic (congestive) heart failure: Secondary | ICD-10-CM

## 2021-07-31 DIAGNOSIS — G4733 Obstructive sleep apnea (adult) (pediatric): Secondary | ICD-10-CM | POA: Diagnosis not present

## 2021-07-31 MED ORDER — BUSPIRONE HCL 30 MG PO TABS: 30.0000 mg | ORAL_TABLET | Freq: Two times a day (BID) | ORAL | 2 refills | Status: DC

## 2021-07-31 NOTE — Progress Notes (Signed)
Subjective:  Patient ID: Marcus King, male    DOB: Sep 07, 1941  Age: 80 y.o. MRN: [id number]  Chief Complaint  Patient presents with   Hyperlipidemia   Hypertension    HPI OSA: Saw [facility]: On BIPAP. Compliant and benefitting.   Hypertension: High in am: 160-180s/ 60-80s, but then improves to 120-130/70s. Pulse 60-80. Has had increase in swelling in BL ankles.  Feels lethargy off/on thorugh the day.  Prostate cancer: on lupron and nubeqa.  GAD: hydroxyzine, paroxetine. Pt saw [facility], who started on dupixent after biopsy. It has helped.   Current Outpatient Medications on File Prior to Visit  Medication Sig Dispense Refill   apixaban (ELIQUIS) 5 MG TABS tablet Take 1 tablet (5 mg total) by mouth in the morning and at bedtime. 180 tablet 3   darolutamide (NUBEQA) 300 MG tablet Take 1 tablet (300 mg total) by mouth daily. 3 days each week 120 tablet 3   Dupilumab (DUPIXENT [place]) Inject into the skin.     hydrOXYzine (ATARAX/VISTARIL) 25 MG tablet Take 1 tablet (25 mg total) by mouth every 8 (eight) hours as needed. 270 tablet 1   labetalol (NORMODYNE) 200 MG tablet Take 200 mg by mouth 2 (two) times daily.     Leuprolide Acetate, 3 Month, (LUPRON DEPOT, 76-MONTH, IM) Inject into the muscle.     PARoxetine (PAXIL) 40 MG tablet Take 1 tablet (40 mg total) by mouth daily. 90 tablet 0   pravastatin (PRAVACHOL) 20 MG tablet Take 1 tablet (20 mg total) by mouth daily. 90 tablet 1   amLODipine (NORVASC) 5 MG tablet Take 1 tablet (5 mg total) by mouth daily. (Patient taking differently: Take 20 mg by mouth daily.) 90 tablet 0   augmented betamethasone dipropionate (DIPROLENE) 0.05 % ointment Apply topically 2 (two) times daily. 100 g 1   busPIRone (BUSPAR) 30 MG tablet Take 1 tablet (30 mg total) by mouth 2 (two) times daily. 60 tablet 3   furosemide (LASIX) 20 MG tablet take 1 tablet (20 mg) by oral route once daily (Patient taking differently: Take 40  mg by mouth daily.) 90 tablet 1   lisinopril (ZESTRIL) 20 MG tablet Take 1 tablet (20 mg total) by mouth daily. (Patient not taking: No sig reported) 90 tablet 1   loratadine (CLARITIN) 10 MG tablet Take by mouth.     triamcinolone cream (KENALOG) 0.1 % Apply topically 2 (two) times daily as needed.     No current facility-administered medications on file prior to visit.   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, NYHA class 1 ([place]) 09/21/2018   Dyslipidemia 08/11/2017   Dyspnea on exertion 04/23/2020   Edema extremities 04/14/2018   Elevated glucose 10/17/2019   Fatigue 04/23/2020   Food poisoning    GAD (generalized anxiety disorder)    Headache    sinus   History of pulmonary embolism 06/13/2020   History of pulmonary embolus (PE) 06/13/2020   Hypertension    Hypertensive heart disease 08/05/2017   Hypoxia 10/17/2019   Inguinal lymphadenopathy 05/26/2020   LVH (left ventricular hypertrophy) due to hypertensive disease 08/11/2017   Malignant neoplasm of prostate ([place]) 09/09/2020   Mixed hyperlipidemia    Orthostatic hypotension    Orthostatic syncope 10/17/2019   OSA on  CPAP    Prostate cancer ([place])    Pulmonary embolism ([place]) 04/2020   PVC's (premature ventricular contractions) 05/18/2018   Rectal mass 05/24/2020   S/P TKR (total knee replacement) using cement, 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   Solid nodule of lung greater than 8 mm in diameter 05/26/2020   Past Surgical History:  Procedure Laterality Date   CARDIAC CATHETERIZATION     CATARACT EXTRACTION W/ INTRAOCULAR LENS  IMPLANT, BILATERAL Bilateral    COLON SURGERY  2014   COLOSTOMY REVERSAL  2014   HERNIA REPAIR     x2   JOINT REPLACEMENT     PROSTATE SURGERY  2006   SEPTOPLASTY     TOTAL KNEE ARTHROPLASTY Left 09/28/2016    Procedure: TOTAL KNEE ARTHROPLASTY;  Surgeon: [name], MD;  Location: [facility];  Service: Orthopedics;  Laterality: Left;    Family History  Problem Relation Age of Onset   Stroke Mother    Parkinson's disease Mother    Lung disease Father    Stroke Father    Alcoholism Father    Leukemia Sister    Muscular dystrophy Son        Becker's   Alzheimer's disease Neg Hx    Social History   Socioeconomic History   Marital status: Married    Spouse name: Not on file   Number of children: 2   Years of education: Not on file   Highest education level: High school graduate  Occupational History   Occupation: Engineer, petroleum    Comment: ICDs/Wound Care  Tobacco Use   Smoking status: Former    Years: 20.00    Types: Cigarettes    Quit date: 1979    Years since quitting: 43.6   Smokeless tobacco: Never   Tobacco comments:    quit 37 years ago  Electronics engineer Use   Vaping Use: Never used  Substance and Sexual Activity   Alcohol use: No   Drug use: No   Sexual activity: Not on file  Other Topics Concern   Not on file  Social History Narrative   LIves at home with his wife   Self employed   Right handed   Drinks about 2 cups of caffeine daily   Social Determinants of Health   Financial Resource Strain: Not on file  Food Insecurity: Not on file  Transportation Needs: Not on file  Physical Activity: Not on file  Stress: Not on file  Social Connections: Not on file    Review of Systems  Constitutional:  Positive for fatigue. Negative for chills and fever.  HENT:  Negative for congestion, rhinorrhea and sore throat.   Respiratory:  Positive for shortness of breath. Negative for cough.   Cardiovascular:  Negative for chest pain and palpitations.  Gastrointestinal:  Negative for abdominal pain, constipation, diarrhea, nausea and vomiting.  Genitourinary:  Negative for dysuria and urgency.       Issues with bladder control  Musculoskeletal:  Negative for arthralgias, back pain  and myalgias.  Neurological:  Positive for weakness and headaches. Negative for dizziness.  Psychiatric/Behavioral:  Negative for dysphoric mood. The patient is not nervous/anxious.     Objective:  BP (!) 148/74   Pulse 68   Temp (!) 97 F (36.1 C)   Resp 18   Ht '5\' 10"'$  (1.778 m)   Wt 197 lb (89.4 kg)   BMI 28.27 kg/m   BP/Weight 07/31/2021 07/30/2021 [id number]  Systolic BP 123456 [id number]  Diastolic BP 74 77 68  Wt. (  Lbs) 197 199 196.5  BMI 28.27 28.55 28.19    Physical Exam Vitals reviewed.  Constitutional:      Appearance: Normal appearance.  Neck:     Vascular: No carotid bruit.  Cardiovascular:     Rate and Rhythm: Normal rate and regular rhythm.     Pulses: Normal pulses.     Heart sounds: Normal heart sounds.  Pulmonary:     Effort: Pulmonary effort is normal.     Breath sounds: Normal breath sounds. No wheezing, rhonchi or rales.  Abdominal:     General: Bowel sounds are normal.     Palpations: Abdomen is soft.     Tenderness: There is no abdominal tenderness.  Musculoskeletal:     Right lower leg: Edema present.     Left lower leg: Edema present.  Neurological:     Mental Status: He is alert.  Psychiatric:        Mood and Affect: Mood normal.        Behavior: Behavior normal.    Diabetic Foot Exam - Simple   No data filed      Lab Results  Component Value Date   WBC 7.8 07/31/2021   HGB 14.0 07/31/2021   HCT 41.9 07/31/2021   PLT 309 07/31/2021   GLUCOSE 83 07/31/2021   CHOL 131 07/31/2021   TRIG 79 07/31/2021   HDL 51 07/31/2021   LDLCALC 64 07/31/2021   ALT 14 07/31/2021   AST 25 07/31/2021   NA 140 07/31/2021   K 4.6 07/31/2021   CL 102 07/31/2021   CREATININE 1.18 07/31/2021   BUN 21 07/31/2021   CO2 21 07/31/2021   TSH 1.300 01/07/2021   INR 1.03 01/09/2019   HGBA1C 5.6 12/01/2017      Assessment & Plan:   1. Hypertensive heart disease with chronic diastolic congestive heart failure ([place]) Not at goal. Labile. Medication  changes: Change amlodipine to 10 mg one twice a day from 20 mg once daily.  Continue labetolol 200 mg one twice a day.  Continue furosemide 40 mg once daily in am.  - CBC with Differential/Platelet - Comprehensive metabolic panel - Lipid panel  2. GAD (generalized anxiety disorder) Increase buspirone 30 mg once twice a day.   3. Mixed hyperlipidemia The current medical regimen is effective;  continue present plan and medications. Recommend continue to work on eating healthy diet and exercise. - FLP.  4. Pedal edema Elevate legs. Avoid salt.   5. Lethargy  Change Paxil to 40 mg once at night. If no improvement in Lethargy over the next 2 weeks, then change Hydroxyzine to once at night.  6. OSA with CPAP Compliant and beneficial.   7. BMI 28.  Recommend continue to work on eating healthy diet and exercise.  8. Prostate Cancer.  Prostate cancer is stable. Management per specialist.  Dr. Bobby Rumpf. Currently on lupron/darolutamide.   Meds ordered this encounter  Medications   busPIRone (BUSPAR) 30 MG tablet    Sig: Take 1 tablet (30 mg total) by mouth in the morning and at bedtime.    Dispense:  60 tablet    Refill:  2    Orders Placed This Encounter  Procedures   CBC with Differential/Platelet   Comprehensive metabolic panel   Lipid panel   Cardiovascular Risk Assessment     Follow-up: Return in about 4 weeks (around 08/28/2021).  An After Visit Summary was printed and given to the patient.  Rochel Brome, MD [facility] [phone]  [phone]  

## 2021-07-31 NOTE — Patient Instructions (Signed)
Lethargy: Change Paxil to 40 mg once at night. If no improvement in Lethargy over the next 2 weeks, then change Hydroxyzine to once at night.  Anxiety: Increase buspirone 30 mg once twice a day.   Hypertension: Change amlodipine to 10 mg one twice a day from 20 mg once daily.  Continue labetolol 200 mg one twice a day.  Continue furosemide 40 mg once daily in am.

## 2021-08-01 LAB — CBC WITH DIFFERENTIAL/PLATELET
Basophils Absolute: 0 10*3/uL (ref 0.0–0.2)
Basos: 0 %
EOS (ABSOLUTE): 0.7 10*3/uL — ABNORMAL HIGH (ref 0.0–0.4)
Eos: 9 %
Hematocrit: 41.9 % (ref 37.5–51.0)
Hemoglobin: 14 g/dL (ref 13.0–17.7)
Immature Grans (Abs): 0 10*3/uL (ref 0.0–0.1)
Immature Granulocytes: 0 %
Lymphocytes Absolute: 1.5 10*3/uL (ref 0.7–3.1)
Lymphs: 20 %
MCH: 29.9 pg (ref 26.6–33.0)
MCHC: 33.4 g/dL (ref 31.5–35.7)
MCV: 89 fL (ref 79–97)
Monocytes Absolute: 0.8 10*3/uL (ref 0.1–0.9)
Monocytes: 10 %
Neutrophils Absolute: 4.7 10*3/uL (ref 1.4–7.0)
Neutrophils: 61 %
Platelets: 309 10*3/uL (ref 150–450)
RBC: 4.69 x10E6/uL (ref 4.14–5.80)
RDW: 13.2 % (ref 11.6–15.4)
WBC: 7.8 10*3/uL (ref 3.4–10.8)

## 2021-08-01 LAB — COMPREHENSIVE METABOLIC PANEL
ALT: 14 IU/L (ref 0–44)
AST: 25 IU/L (ref 0–40)
Albumin/Globulin Ratio: 1.9 (ref 1.2–2.2)
Albumin: 4.5 g/dL (ref 3.6–4.6)
Alkaline Phosphatase: 97 IU/L (ref 44–121)
BUN/Creatinine Ratio: 18 (ref 10–24)
BUN: 21 mg/dL (ref 8–27)
Bilirubin Total: 0.6 mg/dL (ref 0.0–1.2)
CO2: 21 mmol/L (ref 20–29)
Calcium: 9.7 mg/dL (ref 8.6–10.2)
Chloride: 102 mmol/L (ref 96–106)
Creatinine, Ser: 1.18 mg/dL (ref 0.76–1.27)
Globulin, Total: 2.4 g/dL (ref 1.5–4.5)
Glucose: 83 mg/dL (ref 65–99)
Potassium: 4.6 mmol/L (ref 3.5–5.2)
Sodium: 140 mmol/L (ref 134–144)
Total Protein: 6.9 g/dL (ref 6.0–8.5)
eGFR: 62 mL/min/{1.73_m2} (ref >59)

## 2021-08-01 LAB — LIPID PANEL
Chol/HDL Ratio: 2.6 ratio (ref 0.0–5.0)
Cholesterol, Total: 131 mg/dL (ref 100–199)
HDL: 51 mg/dL (ref >39)
LDL Chol Calc (NIH): 64 mg/dL (ref 0–99)
Triglycerides: 79 mg/dL (ref 0–149)
VLDL Cholesterol Cal: 16 mg/dL (ref 5–40)

## 2021-08-01 LAB — CARDIOVASCULAR RISK ASSESSMENT

## 2021-08-12 ENCOUNTER — Encounter: Payer: Self-pay | Admitting: Family Medicine

## 2021-08-13 DIAGNOSIS — Z7901 Long term (current) use of anticoagulants: Secondary | ICD-10-CM | POA: Diagnosis not present

## 2021-08-13 DIAGNOSIS — R5381 Other malaise: Secondary | ICD-10-CM | POA: Diagnosis not present

## 2021-08-13 DIAGNOSIS — Z86718 Personal history of other venous thrombosis and embolism: Secondary | ICD-10-CM | POA: Diagnosis not present

## 2021-08-13 DIAGNOSIS — R0600 Dyspnea, unspecified: Secondary | ICD-10-CM | POA: Diagnosis not present

## 2021-08-13 DIAGNOSIS — I10 Essential (primary) hypertension: Secondary | ICD-10-CM | POA: Diagnosis not present

## 2021-08-13 DIAGNOSIS — R5383 Other fatigue: Secondary | ICD-10-CM | POA: Diagnosis not present

## 2021-08-25 ENCOUNTER — Other Ambulatory Visit: Payer: Self-pay | Admitting: Family Medicine

## 2021-08-28 ENCOUNTER — Other Ambulatory Visit: Payer: Self-pay

## 2021-08-28 ENCOUNTER — Ambulatory Visit (INDEPENDENT_AMBULATORY_CARE_PROVIDER_SITE_OTHER): Payer: Medicare Other | Admitting: Family Medicine

## 2021-08-28 VITALS — BP 120/60 | HR 68 | Temp 97.2°F | Resp 16 | Ht 70.0 in | Wt 197.2 lb

## 2021-08-28 DIAGNOSIS — C61 Malignant neoplasm of prostate: Secondary | ICD-10-CM | POA: Diagnosis not present

## 2021-08-28 DIAGNOSIS — R600 Localized edema: Secondary | ICD-10-CM | POA: Diagnosis not present

## 2021-08-28 DIAGNOSIS — I5030 Unspecified diastolic (congestive) heart failure: Secondary | ICD-10-CM | POA: Diagnosis not present

## 2021-08-28 DIAGNOSIS — F411 Generalized anxiety disorder: Secondary | ICD-10-CM | POA: Diagnosis not present

## 2021-08-28 DIAGNOSIS — I110 Hypertensive heart disease with heart failure: Secondary | ICD-10-CM | POA: Diagnosis not present

## 2021-08-28 DIAGNOSIS — I5032 Chronic diastolic (congestive) heart failure: Secondary | ICD-10-CM | POA: Diagnosis not present

## 2021-08-28 MED ORDER — CHLORTHALIDONE 25 MG PO TABS: 25.0000 mg | ORAL_TABLET | Freq: Every day | ORAL | 2 refills | Status: DC

## 2021-08-28 MED ORDER — LABETALOL HCL 200 MG PO TABS: 200.0000 mg | ORAL_TABLET | Freq: Three times a day (TID) | ORAL | 2 refills | Status: DC

## 2021-08-28 NOTE — Assessment & Plan Note (Addendum)
Start chlorthalidone 25 mg once daily.  Continue lasix 20 mg once daily.  Continue lisinopril 20 mg once daily. Continue labetalol 200 mg 3 times daily. Continue amlodipine 10 mg once daily.

## 2021-08-28 NOTE — Patient Instructions (Addendum)
Start chlorthalidone 25 mg once daily.  Continue amlodipine 10 mg daily.  Continue labetolol 200 mg one three times a day  Continue buspirone 30 mg one twice a day.  Continue lasix 20 mg once daily.

## 2021-08-28 NOTE — Progress Notes (Signed)
Subjective:  Patient ID: Marcus King, male    DOB: 1941-01-07  Age: 80 y.o. MRN: [id number]  Chief Complaint  Patient presents with   Hypertension    HPI Pedal edema (left > right) worsened in the last month. Pt takes Lasix 20 mg one daily. Hypertension:  No new medicines since seen one month ago. In fact, pt misunderstood and decreased amlodipine to 10 mg at night, when I had recommended change 10 mg 2 daily change to bid dosing. No chest pain, No headaches.   Bps 115-172/70-80s. Pulse 59-75. Pt takes sudafed one twice a day. He has checked his bp 1 hour after taking Sudafed and it does not increase. He has been on sudafed for years and becomes severely congested if he does not take it.   GAD: taking buspirone 15 mg one twice a day instead of 30 mg bid. Pt says he did not get a new rx.  Coughing Spells. Violent coughing for 15 minutes. Not after eating. It is random, but usually towards the end of the day.   Current Outpatient Medications on File Prior to Visit  Medication Sig Dispense Refill   amLODipine (NORVASC) 5 MG tablet Take 1 tablet (5 mg total) by mouth daily. (Patient taking differently: Take 10 mg by mouth daily.) 90 tablet 0   apixaban (ELIQUIS) 5 MG TABS tablet Take 1 tablet (5 mg total) by mouth in the morning and at bedtime. 180 tablet 3   darolutamide (NUBEQA) 300 MG tablet Take 1 tablet (300 mg total) by mouth daily. 3 days each week 120 tablet 3   Dupilumab (DUPIXENT [place]) Inject into the skin.     furosemide (LASIX) 20 MG tablet take 1 tablet (20 mg) by oral route once daily (Patient taking differently: Take 40 mg by mouth daily.) 90 tablet 1   hydrOXYzine (ATARAX/VISTARIL) 25 MG tablet Take 1 tablet (25 mg total) by mouth every 8 (eight) hours as needed. 270 tablet 1   lisinopril (ZESTRIL) 20 MG tablet Take 1 tablet (20 mg total) by mouth daily. (Patient taking differently: Take 20 mg by mouth in the morning and at bedtime.) 90 tablet 1   loratadine (CLARITIN) 10  MG tablet Take by mouth.     PARoxetine (PAXIL) 40 MG tablet Take 1 tablet (40 mg total) by mouth daily. 90 tablet 0   pravastatin (PRAVACHOL) 20 MG tablet Take 1 tablet (20 mg total) by mouth daily. 90 tablet 1   augmented betamethasone dipropionate (DIPROLENE) 0.05 % ointment Apply topically 2 (two) times daily. 100 g 1   busPIRone (BUSPAR) 30 MG tablet Take 1 tablet (30 mg total) by mouth in the morning and at bedtime. (Patient not taking: Reported on 08/28/2021) 60 tablet 2   Leuprolide Acetate, 3 Month, (LUPRON DEPOT, 74-MONTH, IM) Inject into the muscle.     triamcinolone cream (KENALOG) 0.1 % Apply topically 2 (two) times daily as needed.     triamcinolone ointment (KENALOG) 0.5 % Apply topically.     No current facility-administered medications on file prior to visit.   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, NYHA class 1 ([place]) 09/21/2018   Dyslipidemia 08/11/2017   Dyspnea on exertion 04/23/2020   Edema extremities 04/14/2018   Elevated glucose 10/17/2019   Fatigue 04/23/2020  Food poisoning    GAD (generalized anxiety disorder)    H/O deep venous thrombosis 04/14/2021   Headache    sinus   History of pulmonary embolism 06/13/2020   History of pulmonary embolus (PE) 06/13/2020   Hypertension    Hypertensive heart disease 08/05/2017   Hypoxia 10/17/2019   Inguinal lymphadenopathy 05/26/2020   LVH (left ventricular hypertrophy) due to hypertensive disease 08/11/2017   Malignant neoplasm of prostate ([place]) 09/09/2020   Mixed hyperlipidemia    Orthostatic hypotension    Orthostatic syncope 10/17/2019   OSA on CPAP    Prostate cancer ([place])    Pulmonary embolism ([place]) 04/2020   PVC's (premature ventricular contractions) 05/18/2018   Rectal mass 05/24/2020   S/P TKR (total knee  replacement) using cement, 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   Solid nodule of lung greater than 8 mm in diameter 05/26/2020   Past Surgical History:  Procedure Laterality Date   CARDIAC CATHETERIZATION     CATARACT EXTRACTION W/ INTRAOCULAR LENS  IMPLANT, BILATERAL Bilateral    COLON SURGERY  2014   COLOSTOMY REVERSAL  2014   HERNIA REPAIR     x2   JOINT REPLACEMENT     PROSTATE SURGERY  2006   SEPTOPLASTY     TOTAL KNEE ARTHROPLASTY Left 09/28/2016   Procedure: TOTAL KNEE ARTHROPLASTY;  Surgeon: [name], MD;  Location: [facility];  Service: Orthopedics;  Laterality: Left;    Family History  Problem Relation Age of Onset   Stroke Mother    Parkinson's disease Mother    Lung disease Father    Stroke Father    Alcoholism Father    Leukemia Sister    Muscular dystrophy Son        Becker's   Alzheimer's disease Neg Hx    Social History   Socioeconomic History   Marital status: Married    Spouse name: Not on file   Number of children: 2   Years of education: Not on file   Highest education level: High school graduate  Occupational History   Occupation: Engineer, petroleum    Comment: ICDs/Wound Care  Tobacco Use   Smoking status: Former    Years: 20.00    Types: Cigarettes    Quit date: 1979    Years since quitting: 43.7   Smokeless tobacco: Never   Tobacco comments:    quit 37 years ago  Electronics engineer Use   Vaping Use: Never used  Substance and Sexual Activity   Alcohol use: No   Drug use: No   Sexual activity: Not on file  Other Topics Concern   Not on file  Social History Narrative   LIves at home with his wife   Self employed   Right handed   Drinks about 2 cups of caffeine daily   Social Determinants of Health   Financial Resource Strain: Not on file  Food Insecurity: Not on file  Transportation Needs: Not on file  Physical Activity: Not on file  Stress: Not on file  Social Connections: Not on file    Review of Systems  Constitutional:  Positive for fatigue. Negative  for chills and fever.  HENT:  Positive for sinus pressure. Negative for congestion, rhinorrhea and sore throat.   Respiratory:  Positive for cough and shortness of breath.   Cardiovascular:  Positive for leg swelling. Negative for chest pain and palpitations.  Gastrointestinal:  Negative for abdominal pain, constipation, diarrhea, nausea and vomiting.  Genitourinary:  Negative for dysuria and urgency.  Musculoskeletal:  Negative  for arthralgias, back pain and myalgias.  Neurological:  Negative for dizziness and headaches.  Psychiatric/Behavioral:  Negative for dysphoric mood. The patient is not nervous/anxious.     Objective:  BP 120/60   Pulse 68   Temp (!) 97.2 F (36.2 C)   Resp 16   Ht '5\' 10"'$  (1.778 m)   Wt 197 lb 3.2 oz (89.4 kg)   BMI 28.30 kg/m   BP/Weight 08/28/2021 07/31/2021 [id number]  Systolic BP 123456 123456 [id number]  Diastolic BP 60 74 77  Wt. (Lbs) 197.2 197 199  BMI 28.3 28.27 28.55    Physical Exam Vitals reviewed.  Constitutional:      Appearance: Normal appearance.  Neck:     Vascular: No carotid bruit.  Cardiovascular:     Rate and Rhythm: Normal rate and regular rhythm.     Heart sounds: Normal heart sounds.  Pulmonary:     Effort: Pulmonary effort is normal.     Breath sounds: Normal breath sounds. No wheezing, rhonchi or rales.  Abdominal:     General: Bowel sounds are normal.     Palpations: Abdomen is soft.     Tenderness: There is no abdominal tenderness.  Musculoskeletal:     Right lower leg: Edema present.     Left lower leg: Edema present.  Neurological:     Mental Status: He is alert.  Psychiatric:        Mood and Affect: Mood normal.        Behavior: Behavior normal.    Diabetic Foot Exam - Simple   No data filed      Lab Results  Component Value Date   WBC 7.8 07/31/2021   HGB 14.0 07/31/2021   HCT 41.9 07/31/2021   PLT 309 07/31/2021   GLUCOSE 83 07/31/2021   CHOL 131 07/31/2021   TRIG 79 07/31/2021   HDL 51 07/31/2021    LDLCALC 64 07/31/2021   ALT 14 07/31/2021   AST 25 07/31/2021   NA 140 07/31/2021   K 4.6 07/31/2021   CL 102 07/31/2021   CREATININE 1.18 07/31/2021   BUN 21 07/31/2021   CO2 21 07/31/2021   TSH 1.300 01/07/2021   INR 1.03 01/09/2019   HGBA1C 5.6 12/01/2017      Assessment & Plan:   Problem List Items Addressed This Visit       Cardiovascular and Mediastinum   Hypertensive heart disease - Primary    Start chlorthalidone 25 mg once daily.  Continue amlodipine 10 mg daily.  Continue labetolol 200 mg one three times a day  Continue lisinopril 20 mg twice daily. Continue lasix 20 mg once daily.       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  Continue lasix 20 mg once daily.  Continue lisinopril 20 mg once daily. Continue labetalol 200 mg 3 times daily. Continue amlodipine 10 mg once daily.        Genitourinary   Malignant neoplasm of prostate ([place])    Management per specialist.          Other   Edema of extremities    Add chlorthalidone 25 mg once daily. Continue Lasix 20 mg once daily.      GAD (generalized anxiety disorder)    Continue buspirone 30 mg one twice a day.  Continue Paxil 40 mg daily Continue hydroxyzine 25 mg 1 3 times daily     .   Follow-up:  Return in about 4 weeks (around 09/25/2021) for chronic follow up.  Address cough at next visit.  An After Visit Summary was printed and given to the patient.  Rochel Brome, MD [facility] [phone]

## 2021-09-02 ENCOUNTER — Other Ambulatory Visit: Payer: Self-pay | Admitting: Pharmacist

## 2021-09-02 DIAGNOSIS — L209 Atopic dermatitis, unspecified: Secondary | ICD-10-CM | POA: Diagnosis not present

## 2021-09-08 ENCOUNTER — Encounter: Payer: Self-pay | Admitting: Family Medicine

## 2021-09-08 DIAGNOSIS — I1A0 Resistant hypertension: Secondary | ICD-10-CM | POA: Insufficient documentation

## 2021-09-08 NOTE — Assessment & Plan Note (Signed)
Management per specialist. 

## 2021-09-08 NOTE — Assessment & Plan Note (Signed)
Add chlorthalidone 25 mg once daily. Continue Lasix 20 mg once daily.

## 2021-09-08 NOTE — Assessment & Plan Note (Signed)
Continue buspirone 30 mg one twice a day.  Continue Paxil 40 mg daily Continue hydroxyzine 25 mg 1 3 times daily

## 2021-09-08 NOTE — Assessment & Plan Note (Addendum)
Start chlorthalidone 25 mg once daily.  Continue amlodipine 10 mg daily.  Continue labetolol 200 mg one three times a day  Continue lisinopril 20 mg twice daily. Continue lasix 20 mg once daily.

## 2021-09-10 ENCOUNTER — Telehealth: Payer: Self-pay

## 2021-09-10 ENCOUNTER — Other Ambulatory Visit: Payer: Self-pay | Admitting: Family Medicine

## 2021-09-10 NOTE — Telephone Encounter (Signed)
Pt called states he has been taking labetalol as prescribed. He wishes to d/c this medication due to hangover until lunch each day. He would like to return to carvedilol. States he returned to carvedilol two days ago and does not have same reaction. If ok would like script of carvedilol sent to [facility].   Royce Macadamia, [place] 09/10/21 11:10 AM

## 2021-09-11 ENCOUNTER — Other Ambulatory Visit: Payer: Self-pay

## 2021-09-11 NOTE — Telephone Encounter (Signed)
I certainly have no clue as to rather or not to send in rx's for him, as he constantly seem's to adjust them on his own.

## 2021-09-11 NOTE — Telephone Encounter (Signed)
Patient reports his bp has been running 110-115/65 in the am and 140/110 in the pm.  Dr. Tobie Poet approved the Coreg 25 mg twice daily and stop the labetalol.  He has an appointment mid October for follow-up

## 2021-09-22 NOTE — Progress Notes (Signed)
Marcus King  [address] [place],  [place]  [address] [phone]  Clinic Day:  09/26/2021  Referring physician: Rochel Brome, MD  This document serves as a record of services personally performed by Marice Potter, MD. It was created on their behalf by [facility], a trained medical scribe. The creation of this record is based on the scribe's personal observations and the provider's statements to them.  HISTORY OF PRESENT ILLNESS:  The patient is a 80 y.o. 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  He is currently on complete androgen deprivation therapy, which consists of Lupron/darolutamide.  As it pertains to his prostate cancer management, he denies having any side effects from his complete androgen blockade therapy.  He also denies having any new symptoms or findings that concern him for progression his metastatic prostate cancer.  PHYSICAL EXAM:  Blood pressure 125/70, pulse 61, temperature 97.9 F (36.6 C), resp. rate 16, height 5\' 10"  (1.778 m), weight 198 lb 1.6 oz (89.9 kg), SpO2 92 %. Wt Readings from Last 3 Encounters:  09/26/21 198 lb 1.6 oz (89.9 kg)  09/24/21 197 lb (89.4 kg)  08/28/21 197 lb 3.2 oz (89.4 kg)   Body mass index is 28.42 kg/m. Performance status (ECOG): 1 Physical Exam Constitutional:      General: He is not in acute distress.    Appearance: Normal appearance. He is normal weight.  HENT:     Head: Normocephalic and atraumatic.  Eyes:     General: No scleral icterus.    Extraocular Movements: Extraocular movements intact.     Conjunctiva/sclera: Conjunctivae normal.     Pupils: Pupils are equal, round, and reactive to light.  Cardiovascular:     Rate and Rhythm: Normal rate and regular rhythm.     Pulses: Normal pulses.     Heart sounds: Normal heart sounds. No murmur heard.   No friction rub. No gallop.  Pulmonary:     Effort:  Pulmonary effort is normal. No respiratory distress.     Breath sounds: Normal breath sounds.  Abdominal:     General: Bowel sounds are normal. There is no distension.     Palpations: Abdomen is soft. There is no hepatomegaly, splenomegaly or mass.     Tenderness: There is no abdominal tenderness.  Musculoskeletal:        General: Normal range of motion.     Cervical back: Normal range of motion and neck supple.     Right lower leg: No edema.     Left lower leg: No edema.  Lymphadenopathy:     Cervical: No cervical adenopathy.  Skin:    General: Skin is warm and dry.  Neurological:     General: No focal deficit present.     Mental Status: He is alert and oriented to person, place, and time. Mental status is at baseline.  Psychiatric:        Mood and Affect: Mood normal.        Behavior: Behavior normal.        Thought Content: Thought content normal.        Judgment: Judgment normal.    LABS:   CBC Latest Ref Rng & Units 09/25/2021 07/31/2021 06/26/2021  WBC - 7.3 7.8 7.1  Hemoglobin 13.5 - 17.5 13.4(A) 14.0 14.2  Hematocrit 41 - 53 40(A) 41.9 42  Platelets 150 - 399 274 309 314   CMP  Latest Ref Rng & Units 09/25/2021 09/24/2021 07/31/2021  Glucose 70 - 99 mg/dL - 91 83  BUN 4 - 21 23(A) 24 21  Creatinine 0.6 - 1.3 1.2 1.09 1.18  Sodium 137 - 147 139 137 140  Potassium 3.4 - 5.3 4.0 4.5 4.6  Chloride 99 - 108 106 94(L) 102  CO2 13 - 22 24(A) 23 21  Calcium 8.7 - 10.7 9.0 9.8 9.7  Total Protein 6.0 - 8.5 g/dL - 7.2 6.9  Total Bilirubin 0.0 - 1.2 mg/dL - 0.4 0.6  Alkaline Phos 25 - 125 98 110 97  AST 14 - 40 26 21 25   ALT 10 - 40 14 13 14      ASSESSMENT & PLAN:  Assessment/Plan:  An 80 y.o. male with metastatic prostate cancer.  His PSA level is even better at 0.1 today.  This suggests that his combination of Lupron /darolutamide continues to keep his disease under ideal control.  His current darolutamide dose is 300 mg every Monday, Wednesday, and Friday.   He will  continue to receive Lupron injections every 3 months.  As his disease remains under control and he is clinically doing well, I will see him back in another 3 months for repeat clinical assessment.  The patient understands all the plans discussed today and is in agreement with them.     I, Rita Ohara, am acting as scribe for Marice Potter, MD    I have reviewed this report as typed by the medical scribe, and it is complete and accurate.  Marcus Macarthur Critchley, MD

## 2021-09-24 ENCOUNTER — Ambulatory Visit (INDEPENDENT_AMBULATORY_CARE_PROVIDER_SITE_OTHER): Payer: Medicare Other | Admitting: Family Medicine

## 2021-09-24 ENCOUNTER — Other Ambulatory Visit: Payer: Self-pay

## 2021-09-24 ENCOUNTER — Ambulatory Visit (INDEPENDENT_AMBULATORY_CARE_PROVIDER_SITE_OTHER): Payer: Medicare Other

## 2021-09-24 ENCOUNTER — Encounter: Payer: Self-pay | Admitting: Family Medicine

## 2021-09-24 VITALS — BP 138/72 | HR 63 | Temp 96.5°F | Ht 70.0 in | Wt 197.0 lb

## 2021-09-24 DIAGNOSIS — F411 Generalized anxiety disorder: Secondary | ICD-10-CM

## 2021-09-24 DIAGNOSIS — Z9989 Dependence on other enabling machines and devices: Secondary | ICD-10-CM

## 2021-09-24 DIAGNOSIS — G4733 Obstructive sleep apnea (adult) (pediatric): Secondary | ICD-10-CM

## 2021-09-24 DIAGNOSIS — E782 Mixed hyperlipidemia: Secondary | ICD-10-CM | POA: Diagnosis not present

## 2021-09-24 DIAGNOSIS — Z6828 Body mass index (BMI) 28.0-28.9, adult: Secondary | ICD-10-CM

## 2021-09-24 DIAGNOSIS — Z23 Encounter for immunization: Secondary | ICD-10-CM

## 2021-09-24 DIAGNOSIS — I110 Hypertensive heart disease with heart failure: Secondary | ICD-10-CM | POA: Diagnosis not present

## 2021-09-24 DIAGNOSIS — I5032 Chronic diastolic (congestive) heart failure: Secondary | ICD-10-CM

## 2021-09-24 NOTE — Progress Notes (Signed)
Subjective:  Patient ID: Marcus King, male    DOB: 1941/04/12  Age: 80 y.o. MRN: [id number]  Chief Complaint  Patient presents with   Anxiety   Hyperlipidemia   Hypertension     HPI Hyperlipidemia: Current medications: Pravastatin 20 mg daily  Hypertension: Bps 104-180/70-80s.  Complications: Hyperlipidemia Current medications: Norvasc 5 mg daily but patient takes 10 mg daily, Coreg 25 mg BID, Chlorthalidone 25 mg daily, Lisinopril 20 mg daily but patient takes BID. Pt checks his bps 6-7 times per day. Called a week ago and wanted to change from labetolol back to coreg 25 mg bid.  Edema- Lasix 20 mg  but patient takes 40 mg daily  Diet: Regular diet Exercise: Does not  Current Outpatient Medications on File Prior to Visit  Medication Sig Dispense Refill   busPIRone (BUSPAR) 30 MG tablet Take 1 tablet (30 mg total) by mouth in the morning and at bedtime. 60 tablet 2   amLODipine (NORVASC) 5 MG tablet Take 1 tablet (5 mg total) by mouth daily. (Patient taking differently: Take 10 mg by mouth daily.) 90 tablet 0   apixaban (ELIQUIS) 5 MG TABS tablet Take 1 tablet (5 mg total) by mouth in the morning and at bedtime. 180 tablet 3   augmented betamethasone dipropionate (DIPROLENE) 0.05 % ointment Apply topically 2 (two) times daily. 100 g 1   carvedilol (COREG) 25 MG tablet take 1 tablet (25 mg) by oral route 2 times per day with food 180 tablet 0   chlorthalidone (HYGROTON) 25 MG tablet Take 1 tablet (25 mg total) by mouth daily. 30 tablet 2   darolutamide (NUBEQA) 300 MG tablet Take 1 tablet (300 mg total) by mouth daily. 3 days each week 120 tablet 3   Dupilumab (DUPIXENT [place]) Inject into the skin.     furosemide (LASIX) 20 MG tablet take 1 tablet (20 mg) by oral route once daily (Patient taking differently: Take 40 mg by mouth daily.) 90 tablet 1   hydrOXYzine (ATARAX/VISTARIL) 25 MG tablet Take 1 tablet (25 mg total) by mouth every 8 (eight) hours as needed. 270 tablet 1    Leuprolide Acetate, 3 Month, (LUPRON DEPOT, 52-MONTH, IM) Inject into the muscle.     lisinopril (ZESTRIL) 20 MG tablet Take 1 tablet (20 mg total) by mouth daily. (Patient taking differently: Take 20 mg by mouth in the morning and at bedtime.) 90 tablet 1   loratadine (CLARITIN) 10 MG tablet Take by mouth.     PARoxetine (PAXIL) 40 MG tablet Take 1 tablet (40 mg total) by mouth daily. 90 tablet 0   pravastatin (PRAVACHOL) 20 MG tablet Take 1 tablet (20 mg total) by mouth daily. 90 tablet 1   triamcinolone cream (KENALOG) 0.1 % Apply topically 2 (two) times daily as needed.     triamcinolone ointment (KENALOG) 0.5 % Apply topically.     No current facility-administered medications on file prior to visit.   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, NYHA class 1 ([place]) 09/21/2018   Dyslipidemia 08/11/2017   Dyspnea on exertion 04/23/2020   Edema extremities 04/14/2018   Elevated glucose 10/17/2019   Fatigue 04/23/2020   Food poisoning    GAD (generalized anxiety disorder)    H/O deep venous thrombosis 04/14/2021  Headache    sinus   History of pulmonary embolism 06/13/2020   History of pulmonary embolus (PE) 06/13/2020   Hypertension    Hypertensive heart disease 08/05/2017   Hypoxia 10/17/2019   Inguinal lymphadenopathy 05/26/2020   LVH (left ventricular hypertrophy) due to hypertensive disease 08/11/2017   Malignant neoplasm of prostate ([place]) 09/09/2020   Mixed hyperlipidemia    Orthostatic hypotension    Orthostatic syncope 10/17/2019   OSA on CPAP    Prostate cancer ([place])    Pulmonary embolism ([place]) 04/2020   PVC's (premature ventricular contractions) 05/18/2018   Rectal mass 05/24/2020   S/P TKR (total knee replacement) using cement, 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   Solid nodule of lung greater than 8  mm in diameter 05/26/2020   Past Surgical History:  Procedure Laterality Date   CARDIAC CATHETERIZATION     CATARACT EXTRACTION W/ INTRAOCULAR LENS  IMPLANT, BILATERAL Bilateral    COLON SURGERY  2014   COLOSTOMY REVERSAL  2014   HERNIA REPAIR     x2   JOINT REPLACEMENT     PROSTATE SURGERY  2006   SEPTOPLASTY     TOTAL KNEE ARTHROPLASTY Left 09/28/2016   Procedure: TOTAL KNEE ARTHROPLASTY;  Surgeon: [name], MD;  Location: [facility];  Service: Orthopedics;  Laterality: Left;    Family History  Problem Relation Age of Onset   Stroke Mother    Parkinson's disease Mother    Lung disease Father    Stroke Father    Alcoholism Father    Leukemia Sister    Muscular dystrophy Son        Becker's   Alzheimer's disease Neg Hx    Social History   Socioeconomic History   Marital status: Married    Spouse name: Not on file   Number of children: 2   Years of education: Not on file   Highest education level: High school graduate  Occupational History   Occupation: Engineer, petroleum    Comment: ICDs/Wound Care  Tobacco Use   Smoking status: Former    Years: 20.00    Types: Cigarettes    Quit date: 1979    Years since quitting: 43.8   Smokeless tobacco: Never   Tobacco comments:    quit 37 years ago  Media planner   Vaping Use: Never used  Substance and Sexual Activity   Alcohol use: No   Drug use: No   Sexual activity: Not on file  Other Topics Concern   Not on file  Social History Narrative   LIves at home with his wife   Self employed   Right handed   Drinks about 2 cups of caffeine daily   Social Determinants of Health   Financial Resource Strain: Not on file  Food Insecurity: Not on file  Transportation Needs: Not on file  Physical Activity: Not on file  Stress: Not on file  Social Connections: Not on file    Review of Systems  Constitutional:  Negative for chills, diaphoresis, fatigue and fever.  HENT:  Negative for congestion, ear pain, nosebleeds and  sore throat.   Respiratory:  Positive for cough and shortness of breath.   Cardiovascular:  Negative for chest pain and leg swelling.  Gastrointestinal:  Negative for abdominal pain, constipation, diarrhea, nausea and vomiting.  Genitourinary:  Negative for dysuria and urgency.  Musculoskeletal:  Negative for arthralgias and myalgias.  Neurological:  Negative for dizziness and headaches.  Psychiatric/Behavioral:  Negative for dysphoric mood.     Objective:  BP 138/72   Pulse 63   Temp (!) 96.5 F (35.8 C)   Ht 5\' 10"  (1.778 m)   Wt 197 lb (89.4 kg)   SpO2 97%   BMI 28.27 kg/m   BP/Weight 09/26/2021 09/26/2021 10/16/1593  Systolic BP 585 929 244  Diastolic BP 82 70 72  Wt. (Lbs) 198.75 198.1 197  BMI 28.52 28.42 28.27    Physical Exam Vitals reviewed.  Constitutional:      Appearance: Normal appearance.  Neck:     Vascular: No carotid bruit.  Cardiovascular:     Rate and Rhythm: Normal rate and regular rhythm.     Pulses: Normal pulses.     Heart sounds: Normal heart sounds.  Pulmonary:     Effort: Pulmonary effort is normal.     Breath sounds: Normal breath sounds. No wheezing, rhonchi or rales.  Abdominal:     General: Bowel sounds are normal.     Palpations: Abdomen is soft.     Tenderness: There is no abdominal tenderness.  Musculoskeletal:     Right lower leg: No edema.     Left lower leg: No edema.  Neurological:     Mental Status: He is alert.  Psychiatric:        Mood and Affect: Mood normal.        Behavior: Behavior normal.    Diabetic Foot Exam - Simple   No data filed      Lab Results  Component Value Date   WBC 7.3 09/25/2021   HGB 13.4 (A) 09/25/2021   HCT 40 (A) 09/25/2021   PLT 274 09/25/2021   GLUCOSE 91 09/24/2021   CHOL 131 07/31/2021   TRIG 79 07/31/2021   HDL 51 07/31/2021   LDLCALC 64 07/31/2021   ALT 14 09/25/2021   AST 26 09/25/2021   NA 139 09/25/2021   K 4.0 09/25/2021   CL 106 09/25/2021   CREATININE 1.2 09/25/2021    BUN 23 (A) 09/25/2021   CO2 24 (A) 09/25/2021   TSH 1.300 01/07/2021   INR 1.03 01/09/2019   HGBA1C 5.6 12/01/2017      Assessment & Plan:   Problem List Items Addressed This Visit       Cardiovascular and Mediastinum   Hypertensive heart disease - Primary    Well controlled.  No medication changes recommended. Continue healthy diet and exercise.       Relevant Orders   Comprehensive metabolic panel (Completed)     Other   Mixed hyperlipidemia    Recommend continue to work on eating healthy diet and exercise.      GAD (generalized anxiety disorder)    The current medical regimen is effective;  continue present plan and medications.      Other Visit Diagnoses     Need for immunization against influenza       Relevant Orders   Flu Vaccine QUAD High Dose(Fluad) (Completed)     .  No orders of the defined types were placed in this encounter.   Orders Placed This Encounter  Procedures   Flu Vaccine QUAD High Dose(Fluad)   Comprehensive metabolic panel      Follow-up: Return in about 3 months (around 12/25/2021) for chronic fasting, AWV (I think - please double check.) schedule with Maudie Mercury..  An After Visit Summary was printed and given to the patient.  Rochel Brome, MD [facility] [phone]

## 2021-09-25 ENCOUNTER — Other Ambulatory Visit: Payer: Self-pay | Admitting: Hematology and Oncology

## 2021-09-25 ENCOUNTER — Inpatient Hospital Stay: Payer: Medicare Other | Attending: Oncology

## 2021-09-25 DIAGNOSIS — C61 Malignant neoplasm of prostate: Secondary | ICD-10-CM | POA: Insufficient documentation

## 2021-09-25 DIAGNOSIS — Z7901 Long term (current) use of anticoagulants: Secondary | ICD-10-CM | POA: Insufficient documentation

## 2021-09-25 DIAGNOSIS — Z86711 Personal history of pulmonary embolism: Secondary | ICD-10-CM | POA: Diagnosis not present

## 2021-09-25 LAB — COMPREHENSIVE METABOLIC PANEL
ALT: 13 IU/L (ref 0–44)
AST: 21 IU/L (ref 0–40)
Albumin/Globulin Ratio: 1.7 (ref 1.2–2.2)
Albumin: 4.5 g/dL (ref 3.6–4.6)
Alkaline Phosphatase: 110 IU/L (ref 44–121)
BUN/Creatinine Ratio: 22 (ref 10–24)
BUN: 24 mg/dL (ref 8–27)
Bilirubin Total: 0.4 mg/dL (ref 0.0–1.2)
CO2: 23 mmol/L (ref 20–29)
Calcium: 9.8 mg/dL (ref 8.6–10.2)
Chloride: 94 mmol/L — ABNORMAL LOW (ref 96–106)
Creatinine, Ser: 1.09 mg/dL (ref 0.76–1.27)
Globulin, Total: 2.7 g/dL (ref 1.5–4.5)
Glucose: 91 mg/dL (ref 70–99)
Potassium: 4.5 mmol/L (ref 3.5–5.2)
Sodium: 137 mmol/L (ref 134–144)
Total Protein: 7.2 g/dL (ref 6.0–8.5)
eGFR: 68 mL/min/{1.73_m2} (ref >59)

## 2021-09-25 LAB — COMPREHENSIVE METABOLIC PANEL WITH GFR
Albumin: 4 (ref 3.5–5.0)
Calcium: 9 (ref 8.7–10.7)

## 2021-09-25 LAB — BASIC METABOLIC PANEL
BUN: 23 — AB (ref 4–21)
CO2: 24 — AB (ref 13–22)
Chloride: 106 (ref 99–108)
Creatinine: 1.2 (ref 0.6–1.3)
Glucose: 124
Potassium: 4 (ref 3.4–5.3)
Sodium: 139 (ref 137–147)

## 2021-09-25 LAB — HEPATIC FUNCTION PANEL
ALT: 14 (ref 10–40)
AST: 26 (ref 14–40)
Alkaline Phosphatase: 98 (ref 25–125)
Bilirubin, Total: 0.4

## 2021-09-25 LAB — CBC
MCV: 87 (ref 80–94)
RBC: 4.6 (ref 3.87–5.11)

## 2021-09-25 LAB — CBC AND DIFFERENTIAL
HCT: 40 — AB (ref 41–53)
Hemoglobin: 13.4 — AB (ref 13.5–17.5)
Neutrophils Absolute: 4.96
Platelets: 274 (ref 150–399)
WBC: 7.3

## 2021-09-26 ENCOUNTER — Telehealth: Payer: Self-pay | Admitting: Oncology

## 2021-09-26 ENCOUNTER — Inpatient Hospital Stay: Payer: Medicare Other

## 2021-09-26 ENCOUNTER — Inpatient Hospital Stay (INDEPENDENT_AMBULATORY_CARE_PROVIDER_SITE_OTHER): Payer: Medicare Other | Admitting: Oncology

## 2021-09-26 ENCOUNTER — Other Ambulatory Visit: Payer: Self-pay

## 2021-09-26 VITALS — BP 133/82 | HR 59 | Temp 98.5°F | Resp 18 | Ht 70.0 in | Wt 198.8 lb

## 2021-09-26 VITALS — BP 125/70 | HR 61 | Temp 97.9°F | Resp 16 | Ht 70.0 in | Wt 198.1 lb

## 2021-09-26 DIAGNOSIS — C61 Malignant neoplasm of prostate: Secondary | ICD-10-CM

## 2021-09-26 DIAGNOSIS — Z86711 Personal history of pulmonary embolism: Secondary | ICD-10-CM | POA: Diagnosis not present

## 2021-09-26 DIAGNOSIS — Z7901 Long term (current) use of anticoagulants: Secondary | ICD-10-CM | POA: Diagnosis not present

## 2021-09-26 MED ORDER — LEUPROLIDE ACETATE (3 MONTH) 22.5 MG IM KIT
22.5000 mg | PACK | Freq: Once | INTRAMUSCULAR | Status: AC
  Administered 2021-09-26: 22.5 mg via INTRAMUSCULAR
  Filled 2021-09-26: qty 22.5

## 2021-09-26 NOTE — Telephone Encounter (Signed)
Per 10/14 LOS, patient scheduled for Jan 2023 Appt's.  Gave patient Appt Summary

## 2021-09-26 NOTE — Progress Notes (Signed)
1PT STABLE AT TIME OF DISCHARGE 029:

## 2021-09-26 NOTE — Patient Instructions (Signed)
Leuprolide injection What is this medication? LEUPROLIDE (loo PROE lide) is a man-made hormone. It is used to treat the symptoms of prostate cancer. This medicine may also be used to treat childrenwith early onset of puberty. It may be used for other hormonal conditions. This medicine may be used for other purposes; ask your health care provider orpharmacist if you have questions. COMMON BRAND NAME(S): Lupron What should I tell my care team before I take this medication? They need to know if you have any of these conditions: diabetes heart disease or previous heart attack high blood pressure high cholesterol pain or difficulty passing urine spinal cord metastasis stroke tobacco smoker an unusual or allergic reaction to leuprolide, benzyl alcohol, other medicines, foods, dyes, or preservatives pregnant or trying to get pregnant breast-feeding How should I use this medication? This medicine is for injection under the skin or into a muscle. You will be taught how to prepare and give this medicine. Use exactly as directed. Take your medicine at regular intervals. Do not take your medicine more often thandirected. It is important that you put your used needles and syringes in a special sharps container. Do not put them in a trash can. If you do not have a sharpscontainer, call your pharmacist or healthcare provider to get one. A special MedGuide will be given to you by the pharmacist with eachprescription and refill. Be sure to read this information carefully each time. Talk to your pediatrician regarding the use of this medicine in children. While this medicine may be prescribed for children as young as 8 years for selectedconditions, precautions do apply. Overdosage: If you think you have taken too much of this medicine contact apoison control center or emergency room at once. NOTE: This medicine is only for you. Do not share this medicine with others. What if I miss a dose? If you miss a  dose, take it as soon as you can. If it is almost time for yournext dose, take only that dose. Do not take double or extra doses. What may interact with this medication? Do not take this medicine with any of the following medications: chasteberry cisapride dronedarone pimozide thioridazine This medicine may also interact with the following medications: herbal or dietary supplements, like black cohosh or DHEA male hormones, like estrogens or progestins and birth control pills, patches, rings, or injections male hormones, like testosterone other medicines that prolong the QT interval (abnormal heart rhythm) This list may not describe all possible interactions. Give your health care provider a list of all the medicines, herbs, non-prescription drugs, or dietary supplements you use. Also tell them if you smoke, drink alcohol, or use illegaldrugs. Some items may interact with your medicine. What should I watch for while using this medication? Visit your doctor or health care professional for regular checks on your progress. During the first week, your symptoms may get worse, but then will improve as you continue your treatment. You may get hot flashes, increased bone pain, increased difficulty passing urine, or an aggravation of nerve symptoms. Discuss these effects with your doctor or health care professional, some ofthem may improve with continued use of this medicine. Male patients may experience a menstrual cycle or spotting during the first 2 months of therapy with this medicine. If this continues, contact your doctor orhealth care professional. This medicine may increase blood sugar. Ask your healthcare provider if changesin diet or medicines are needed if you have diabetes. What side effects may I notice from receiving this medication? Side   effects that you should report to your doctor or health care professionalas soon as possible: allergic reactions like skin rash, itching or hives,  swelling of the face, lips, or tongue breathing problems chest pain depression or memory disorders pain in your legs or groin pain at site where injected severe headache signs and symptoms of high blood sugar such as being more thirsty or hungry or having to urinate more than normal. You may also feel very tired or have blurry vision swelling of the feet and legs visual changes vomiting Side effects that usually do not require medical attention (report to yourdoctor or health care professional if they continue or are bothersome): breast swelling or tenderness decrease in sex drive or performance diarrhea hot flashes loss of appetite muscle, joint, or bone pains nausea redness or irritation at site where injected skin problems or acne This list may not describe all possible side effects. Call your doctor for medical advice about side effects. You may report side effects to FDA at1-800-FDA-1088. Where should I keep my medication? Keep out of the reach of children. Store below 25 degrees C (77 degrees F). Do not freeze. Protect from light. Do not use if it is not clear or if there are particles present. Throw away anyunused medicine after the expiration date. NOTE: This sheet is a summary. It may not cover all possible information. If you have questions about this medicine, talk to your doctor, pharmacist, orhealth care provider.  2022 Elsevier/Gold Standard (2019-11-01 10:57:41)  

## 2021-09-27 ENCOUNTER — Encounter: Payer: Self-pay | Admitting: Oncology

## 2021-09-27 LAB — PROSTATE-SPECIFIC AG, SERUM (LABCORP): Prostate Specific Ag, Serum: 0.1 ng/mL (ref 0.0–4.0)

## 2021-09-29 NOTE — Assessment & Plan Note (Signed)
The current medical regimen is effective;  continue present plan and medications.  

## 2021-09-29 NOTE — Assessment & Plan Note (Addendum)
Recommend continue to work on eating healthy diet and exercise. Continue pravastatin 20 mg once daily at night

## 2021-09-29 NOTE — Assessment & Plan Note (Addendum)
Fair control No medication changes recommended.  Continue Norvasc 10 mg daily, Coreg 25 mg BID, Chlorthalidone 25 mg daily, Lisinopril 20 mg BID. Continue healthy diet and exercise.

## 2021-10-01 DIAGNOSIS — L209 Atopic dermatitis, unspecified: Secondary | ICD-10-CM | POA: Diagnosis not present

## 2021-10-05 ENCOUNTER — Encounter: Payer: Self-pay | Admitting: Family Medicine

## 2021-10-05 DIAGNOSIS — Z6828 Body mass index (BMI) 28.0-28.9, adult: Secondary | ICD-10-CM | POA: Insufficient documentation

## 2021-10-05 NOTE — Assessment & Plan Note (Signed)
Continue cpap.  

## 2021-10-07 ENCOUNTER — Other Ambulatory Visit: Payer: Self-pay | Admitting: Family Medicine

## 2021-10-07 NOTE — Telephone Encounter (Signed)
Refill sent to pharmacy.   

## 2021-10-14 ENCOUNTER — Other Ambulatory Visit: Payer: Self-pay | Admitting: Family Medicine

## 2021-10-21 ENCOUNTER — Other Ambulatory Visit: Payer: Self-pay

## 2021-10-21 DIAGNOSIS — F411 Generalized anxiety disorder: Secondary | ICD-10-CM

## 2021-10-21 MED ORDER — BUSPIRONE HCL 30 MG PO TABS: 30.0000 mg | ORAL_TABLET | Freq: Two times a day (BID) | ORAL | 2 refills | Status: DC

## 2021-10-21 MED ORDER — LISINOPRIL 20 MG PO TABS: 20.0000 mg | ORAL_TABLET | Freq: Two times a day (BID) | ORAL | 0 refills | Status: DC

## 2021-10-21 MED ORDER — PAROXETINE HCL 40 MG PO TABS: 40.0000 mg | ORAL_TABLET | Freq: Every day | ORAL | 0 refills | Status: DC

## 2021-10-22 DIAGNOSIS — L209 Atopic dermatitis, unspecified: Secondary | ICD-10-CM | POA: Diagnosis not present

## 2021-10-22 DIAGNOSIS — L281 Prurigo nodularis: Secondary | ICD-10-CM | POA: Diagnosis not present

## 2021-10-22 DIAGNOSIS — L299 Pruritus, unspecified: Secondary | ICD-10-CM | POA: Diagnosis not present

## 2021-10-27 DIAGNOSIS — Z20828 Contact with and (suspected) exposure to other viral communicable diseases: Secondary | ICD-10-CM | POA: Diagnosis not present

## 2021-10-30 NOTE — Progress Notes (Signed)
Sent in re-enrollment paperwork for Eliquis to [facility] Patient Assistance Program, for year 2023. Also sent in re-enrollment paperwork for Morrison, to [employer] [place] [employer], for year 2023.

## 2021-11-17 ENCOUNTER — Encounter: Payer: Self-pay | Admitting: Oncology

## 2021-11-17 ENCOUNTER — Other Ambulatory Visit (HOSPITAL_COMMUNITY): Payer: Self-pay

## 2021-11-19 DIAGNOSIS — L209 Atopic dermatitis, unspecified: Secondary | ICD-10-CM | POA: Diagnosis not present

## 2021-11-22 ENCOUNTER — Other Ambulatory Visit: Payer: Self-pay | Admitting: Family Medicine

## 2021-11-24 ENCOUNTER — Ambulatory Visit: Payer: Medicare Other | Admitting: Family Medicine

## 2021-11-25 ENCOUNTER — Ambulatory Visit (INDEPENDENT_AMBULATORY_CARE_PROVIDER_SITE_OTHER): Payer: Medicare Other | Admitting: Family Medicine

## 2021-11-25 VITALS — BP 130/68 | HR 68 | Temp 98.5°F | Resp 16 | Wt 199.0 lb

## 2021-11-25 DIAGNOSIS — I5032 Chronic diastolic (congestive) heart failure: Secondary | ICD-10-CM

## 2021-11-25 DIAGNOSIS — I110 Hypertensive heart disease with heart failure: Secondary | ICD-10-CM | POA: Diagnosis not present

## 2021-11-25 DIAGNOSIS — Z20822 Contact with and (suspected) exposure to covid-19: Secondary | ICD-10-CM | POA: Insufficient documentation

## 2021-11-25 DIAGNOSIS — L309 Dermatitis, unspecified: Secondary | ICD-10-CM

## 2021-11-25 LAB — POC COVID19 BINAXNOW: SARS Coronavirus 2 Ag: NEGATIVE

## 2021-11-25 NOTE — Progress Notes (Signed)
Acute Office Visit  Subjective:    Patient ID: Marcus King, male    DOB: June 14, 1941, 80 y.o.   MRN: [id number]  Chief Complaint  Patient presents with   Rash    HPI: Patient is in today for rash x 4-6 weeks on lower legs. Large circular dry, red areas. Has tried numerous steroid creams/gels. Do not help .  Patient sees Dr. Michele Mcalpine regularly but has not been back for this particular rash.  Patient is also on antihistamines.  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, NYHA class 1 ([place]) 09/21/2018   Dyslipidemia 08/11/2017   Dyspnea on exertion 04/23/2020   Edema extremities 04/14/2018   Elevated glucose 10/17/2019   Fatigue 04/23/2020   Food poisoning    GAD (generalized anxiety disorder)    H/O deep venous thrombosis 04/14/2021   Headache    sinus   History of pulmonary embolism 06/13/2020   History of pulmonary embolus (PE) 06/13/2020   Hypertension    Hypertensive heart disease 08/05/2017   Hypoxia 10/17/2019   Inguinal lymphadenopathy 05/26/2020   LVH (left ventricular hypertrophy) due to hypertensive disease 08/11/2017   Malignant neoplasm of prostate ([place]) 09/09/2020   Mixed hyperlipidemia    Orthostatic hypotension    Orthostatic syncope 10/17/2019   OSA on CPAP    Prostate cancer ([place])    Pulmonary embolism ([place]) 04/2020   PVC's (premature ventricular contractions) 05/18/2018   Rectal mass 05/24/2020   S/P TKR (total knee replacement) using cement, 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   Solid nodule of lung greater than 8 mm in diameter 05/26/2020    Past Surgical History:  Procedure Laterality Date   CARDIAC CATHETERIZATION     CATARACT EXTRACTION W/ INTRAOCULAR LENS  IMPLANT, BILATERAL Bilateral    COLON SURGERY  2014   COLOSTOMY REVERSAL  2014   HERNIA REPAIR     x2   JOINT  REPLACEMENT     PROSTATE SURGERY  2006   SEPTOPLASTY     TOTAL KNEE ARTHROPLASTY Left 09/28/2016   Procedure: TOTAL KNEE ARTHROPLASTY;  Surgeon: [name], MD;  Location: [facility];  Service: Orthopedics;  Laterality: Left;    Family History  Problem Relation Age of Onset   Stroke Mother    Parkinson's disease Mother    Lung disease Father    Stroke Father    Alcoholism Father    Leukemia Sister    Muscular dystrophy Son        Becker's   Alzheimer's disease Neg Hx     Social History   Socioeconomic History   Marital status: Married    Spouse name: Not on file   Number of children: 2   Years of education: Not on file   Highest education level: High school graduate  Occupational History   Occupation: Engineer, petroleum    Comment: ICDs/Wound Care  Tobacco Use   Smoking status: Former    Years: 20.00    Types: Cigarettes    Quit date: 1979    Years since quitting: 44.0   Smokeless tobacco: Never   Tobacco comments:    quit 37 years ago  Vaping Use   Vaping Use: Never used  Substance and Sexual Activity   Alcohol use: No   Drug use: No   Sexual activity:  Not on file  Other Topics Concern   Not on file  Social History Narrative   LIves at home with his wife   Self employed   Right handed   Drinks about 2 cups of caffeine daily   Social Determinants of Health   Financial Resource Strain: Not on file  Food Insecurity: Not on file  Transportation Needs: Not on file  Physical Activity: Not on file  Stress: Not on file  Social Connections: Not on file  Intimate Partner Violence: Not on file    Outpatient Medications Prior to Visit  Medication Sig Dispense Refill   amLODipine (NORVASC) 5 MG tablet Take 1 tablet (5 mg total) by mouth daily. (Patient taking differently: Take 10 mg by mouth daily.) 90 tablet 0   apixaban (ELIQUIS) 5 MG TABS tablet Take 1 tablet (5 mg total) by mouth in the morning and at bedtime. 180 tablet 3   augmented betamethasone  dipropionate (DIPROLENE) 0.05 % ointment Apply topically 2 (two) times daily. 100 g 1   busPIRone (BUSPAR) 30 MG tablet Take 1 tablet (30 mg total) by mouth in the morning and at bedtime. 60 tablet 2   cephALEXin (KEFLEX) 500 MG capsule Take 500 mg by mouth 2 (two) times daily.     chlorthalidone (HYGROTON) 25 MG tablet Take 1 tablet (25 mg total) by mouth daily. 30 tablet 2   darolutamide (NUBEQA) 300 MG tablet Take 1 tablet (300 mg total) by mouth daily. 3 days each week 120 tablet 3   Dupilumab (DUPIXENT [place]) Inject into the skin.     furosemide (LASIX) 20 MG tablet Take 2 tablets (40 mg total) by mouth daily. 180 tablet 0   hydrOXYzine (ATARAX/VISTARIL) 25 MG tablet Take 1 tablet (25 mg total) by mouth every 8 (eight) hours as needed. 270 tablet 1   Leuprolide Acetate, 3 Month, (LUPRON DEPOT, 54-MONTH, IM) Inject into the muscle.     lisinopril (ZESTRIL) 20 MG tablet Take 1 tablet (20 mg total) by mouth in the morning and at bedtime. 180 tablet 0   loratadine (CLARITIN) 10 MG tablet Take by mouth.     mupirocin ointment (BACTROBAN) 2 % Apply topically 3 (three) times daily.     PARoxetine (PAXIL) 40 MG tablet Take 1 tablet (40 mg total) by mouth daily. 90 tablet 0   pravastatin (PRAVACHOL) 20 MG tablet Take 1 tablet (20 mg total) by mouth daily. 90 tablet 1   triamcinolone cream (KENALOG) 0.1 % Apply topically 2 (two) times daily as needed.     triamcinolone ointment (KENALOG) 0.5 % Apply topically.     carvedilol (COREG) 25 MG tablet take 1 tablet (25 mg) by oral route 2 times per day with food 180 tablet 0   No facility-administered medications prior to visit.    Allergies  Allergen Reactions   Beef-Derived Products Diarrhea   Clonazepam Other (See Comments)    High doses cause drunk feeling (>1 mg)   Monosodium Glutamate Other (See Comments)    MSG (soups and oriental foods)   Pegademase Bovine Diarrhea    Review of Systems  Constitutional:  Negative for fatigue and fever.   Respiratory:  Negative for cough and shortness of breath.   Cardiovascular:  Negative for chest pain.  Skin:  Positive for rash.      Objective:    Physical Exam Constitutional:      Appearance: Normal appearance.  Skin:    Findings: Rash (Large circular lesiones on bilateral legs. 3-4  different size on each legs.) present.  Neurological:     Mental Status: He is alert.    BP 130/68    Pulse 68    Temp 98.5 F (36.9 C)    Resp 16    Wt 199 lb (90.3 kg)    BMI 28.55 kg/m  Wt Readings from Last 3 Encounters:  11/25/21 199 lb (90.3 kg)  09/26/21 198 lb 12 oz (90.2 kg)  09/26/21 198 lb 1.6 oz (89.9 kg)    Health Maintenance Due  Topic Date Due   TETANUS/TDAP  12/12/2019   COVID-19 Vaccine (2 - [place] risk series) 09/24/2021    There are no preventive care reminders to display for this patient.   Lab Results  Component Value Date   TSH 1.300 01/07/2021   Lab Results  Component Value Date   WBC 8.0 11/25/2021   HGB 14.5 11/25/2021   HCT 43.5 11/25/2021   MCV 88 11/25/2021   PLT 332 11/25/2021   Lab Results  Component Value Date   NA 139 11/25/2021   K 4.3 11/25/2021   CO2 23 11/25/2021   GLUCOSE 94 11/25/2021   BUN 18 11/25/2021   CREATININE 1.18 11/25/2021   BILITOT 0.2 11/25/2021   ALKPHOS 123 (H) 11/25/2021   AST 20 11/25/2021   ALT 15 11/25/2021   PROT 6.7 11/25/2021   ALBUMIN 4.6 11/25/2021   CALCIUM 9.6 11/25/2021   ANIONGAP 9 01/09/2019   EGFR 62 11/25/2021   Lab Results  Component Value Date   CHOL 131 07/31/2021   Lab Results  Component Value Date   HDL 51 07/31/2021   Lab Results  Component Value Date   LDLCALC 64 07/31/2021   Lab Results  Component Value Date   TRIG 79 07/31/2021   Lab Results  Component Value Date   CHOLHDL 2.6 07/31/2021   Lab Results  Component Value Date   HGBA1C 5.6 12/01/2017       Assessment & Plan:   Problem List Items Addressed This Visit       Cardiovascular and Mediastinum    Hypertensive heart disease with chronic diastolic congestive heart failure ([place])    Well controlled.  No changes to medicines.  Continue to work on eating a healthy diet and exercise.  Labs drawn today.       Relevant Orders   CBC with Differential/Platelet (Completed)     Musculoskeletal and Integument   Dermatitis    Recommended to patient to call Dr Michele Mcalpine due to odd rash on bilateral lower extremities. Lab drawn today.      Relevant Orders   CBC with Differential/Platelet (Completed)   Comprehensive metabolic panel (Completed)   Sedimentation rate (Completed)     Other   Close exposure to COVID-19 virus - Primary    Checked for covid 19 because his wife is positive.  Covid was negative.      Relevant Orders   POC COVID-19 (Completed)   No orders of the defined types were placed in this encounter.   Orders Placed This Encounter  Procedures   CBC with Differential/Platelet   Comprehensive metabolic panel   Sedimentation rate   POC COVID-19   I,Marla I Leal-Borjas,acting as a scribe for Rochel Brome, MD.,have documented all relevant documentation on the behalf of Rochel Brome, MD,as directed by  Rochel Brome, MD while in the presence of Rochel Brome, MD.    Follow-up: No follow-ups on file.  An After Visit Summary was printed and given to the  patient.  Rochel Brome, MD [facility] [phone]

## 2021-11-26 LAB — CBC WITH DIFFERENTIAL/PLATELET
Basophils Absolute: 0 10*3/uL (ref 0.0–0.2)
Basos: 0 %
EOS (ABSOLUTE): 0.7 10*3/uL — ABNORMAL HIGH (ref 0.0–0.4)
Eos: 9 %
Hematocrit: 43.5 % (ref 37.5–51.0)
Hemoglobin: 14.5 g/dL (ref 13.0–17.7)
Immature Grans (Abs): 0.1 10*3/uL (ref 0.0–0.1)
Immature Granulocytes: 1 %
Lymphocytes Absolute: 1.8 10*3/uL (ref 0.7–3.1)
Lymphs: 23 %
MCH: 29.4 pg (ref 26.6–33.0)
MCHC: 33.3 g/dL (ref 31.5–35.7)
MCV: 88 fL (ref 79–97)
Monocytes Absolute: 0.9 10*3/uL (ref 0.1–0.9)
Monocytes: 12 %
Neutrophils Absolute: 4.5 10*3/uL (ref 1.4–7.0)
Neutrophils: 55 %
Platelets: 332 10*3/uL (ref 150–450)
RBC: 4.94 x10E6/uL (ref 4.14–5.80)
RDW: 13.3 % (ref 11.6–15.4)
WBC: 8 10*3/uL (ref 3.4–10.8)

## 2021-11-26 LAB — COMPREHENSIVE METABOLIC PANEL
ALT: 15 IU/L (ref 0–44)
AST: 20 IU/L (ref 0–40)
Albumin/Globulin Ratio: 2.2 (ref 1.2–2.2)
Albumin: 4.6 g/dL (ref 3.6–4.6)
Alkaline Phosphatase: 123 IU/L — ABNORMAL HIGH (ref 44–121)
BUN/Creatinine Ratio: 15 (ref 10–24)
BUN: 18 mg/dL (ref 8–27)
Bilirubin Total: 0.2 mg/dL (ref 0.0–1.2)
CO2: 23 mmol/L (ref 20–29)
Calcium: 9.6 mg/dL (ref 8.6–10.2)
Chloride: 101 mmol/L (ref 96–106)
Creatinine, Ser: 1.18 mg/dL (ref 0.76–1.27)
Globulin, Total: 2.1 g/dL (ref 1.5–4.5)
Glucose: 94 mg/dL (ref 70–99)
Potassium: 4.3 mmol/L (ref 3.5–5.2)
Sodium: 139 mmol/L (ref 134–144)
Total Protein: 6.7 g/dL (ref 6.0–8.5)
eGFR: 62 mL/min/{1.73_m2} (ref >59)

## 2021-11-26 LAB — SEDIMENTATION RATE: Sed Rate: 15 mm/hr (ref 0–30)

## 2021-11-27 DIAGNOSIS — D485 Neoplasm of uncertain behavior of skin: Secondary | ICD-10-CM | POA: Diagnosis not present

## 2021-11-27 DIAGNOSIS — L209 Atopic dermatitis, unspecified: Secondary | ICD-10-CM | POA: Diagnosis not present

## 2021-11-27 DIAGNOSIS — L299 Pruritus, unspecified: Secondary | ICD-10-CM | POA: Diagnosis not present

## 2021-11-27 DIAGNOSIS — L308 Other specified dermatitis: Secondary | ICD-10-CM | POA: Diagnosis not present

## 2021-12-05 ENCOUNTER — Other Ambulatory Visit: Payer: Self-pay | Admitting: Family Medicine

## 2021-12-12 ENCOUNTER — Encounter: Payer: Self-pay | Admitting: Family Medicine

## 2021-12-12 NOTE — Assessment & Plan Note (Signed)
Checked for covid 30 because his wife is positive.  Covid was negative.

## 2021-12-12 NOTE — Assessment & Plan Note (Signed)
Well controlled.  ?No changes to medicines.  ?Continue to work on eating a healthy diet and exercise.  ?Labs drawn today.  ?

## 2021-12-12 NOTE — Assessment & Plan Note (Addendum)
Recommended to patient to call Dr Michele Mcalpine due to odd rash on bilateral lower extremities. Often when already established he can call himself and get in quicker. If issues, pt to call us back for assistance.  Lab drawn today.

## 2021-12-15 DIAGNOSIS — L209 Atopic dermatitis, unspecified: Secondary | ICD-10-CM | POA: Diagnosis not present

## 2021-12-15 DIAGNOSIS — L299 Pruritus, unspecified: Secondary | ICD-10-CM | POA: Diagnosis not present

## 2021-12-17 DIAGNOSIS — Z7901 Long term (current) use of anticoagulants: Secondary | ICD-10-CM | POA: Diagnosis not present

## 2021-12-17 DIAGNOSIS — Z86718 Personal history of other venous thrombosis and embolism: Secondary | ICD-10-CM | POA: Diagnosis not present

## 2021-12-17 DIAGNOSIS — I10 Essential (primary) hypertension: Secondary | ICD-10-CM | POA: Diagnosis not present

## 2021-12-18 ENCOUNTER — Other Ambulatory Visit: Payer: Self-pay | Admitting: Pharmacist

## 2021-12-20 ENCOUNTER — Other Ambulatory Visit: Payer: Self-pay | Admitting: Family Medicine

## 2021-12-20 ENCOUNTER — Other Ambulatory Visit: Payer: Self-pay | Admitting: Physician Assistant

## 2021-12-20 DIAGNOSIS — F411 Generalized anxiety disorder: Secondary | ICD-10-CM

## 2021-12-22 NOTE — Progress Notes (Signed)
[facility]  [address] [place],  [place]  [address] [phone]  I connected with Nycere Presley on 12/26/2021 at 10:00 AM EDT by telephone visit and verified that I am speaking with the correct person using two identifiers.   I discussed the limitations, risks, security and privacy concerns of performing an evaluation and management service by telemedicine and the availability of in-person appointments. I also discussed with the patient that there may be a patient responsible charge related to this service. The patient expressed understanding and agreed to proceed.   Other persons participating in the visit and their role in the encounter:   Patient's location:  Home Provider's location:  Office  Clinic Day:  12/26/2021  Referring physician: Rochel Brome, MD  VIRTUAL VISIT   HISTORY OF PRESENT ILLNESS:  The patient is an 81 y.o. 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  He is currently on complete androgen deprivation therapy, which consists of Lupron/darolutamide.  As it pertains to his prostate cancer management, he denies having any side effects from his complete androgen blockade therapy.  He also denies having any new symptoms or findings that concern him for progression his metastatic prostate cancer.  PHYSICAL EXAM:  Blood pressure 131/75, pulse 68, temperature 97.8 F (36.6 C), resp. rate 16, height 5\' 10"  (1.778 m), weight 191 lb 8 oz (86.9 kg), SpO2 92 %. Wt Readings from Last 3 Encounters:  12/26/21 197 lb 8 oz (89.6 kg)  12/26/21 191 lb 8 oz (86.9 kg)  11/25/21 199 lb (90.3 kg)   Body mass index is 27.48 kg/m. Performance status (ECOG): 1  Physical exam deferred  LABS:   CBC Latest Ref Rng & Units 12/25/2021 11/25/2021 09/25/2021  WBC - 9.0 8.0 7.3  Hemoglobin 13.5 - 17.5 14.2 14.5 13.4(A)  Hematocrit 41 - 53 43 43.5 40(A)  Platelets 150 - 399 327  332 274   CMP Latest Ref Rng & Units 12/25/2021 11/25/2021 09/25/2021  Glucose 70 - 99 mg/dL - 94 -  BUN 4 - 21 36(A) 18 23(A)  Creatinine 0.6 - 1.3 1.5(A) 1.18 1.2  Sodium 137 - 147 139 139 139  Potassium 3.4 - 5.3 3.5 4.3 4.0  Chloride 99 - 108 100 101 106  CO2 13 - 22 30(A) 23 24(A)  Calcium 8.7 - 10.7 9.4 9.6 9.0  Total Protein 6.0 - 8.5 g/dL - 6.7 -  Total Bilirubin 0.0 - 1.2 mg/dL - 0.2 -  Alkaline Phos 25 - 125 93 123(H) 98  AST 14 - 40 27 20 26   ALT 10 - 40 18 15 14     Latest Reference Range & Units 03/27/21 11:33 06/26/21 10:23 09/25/21 11:09 12/25/21 10:13  Prostate Specific Ag, Serum 0.0 - 4.0 ng/mL 0.2 0.2 0.1   Prostatic Specific Antigen 0.00 - 4.00 ng/mL    0.10    ASSESSMENT & PLAN:  Assessment/Plan:  An 81 y.o. male with metastatic prostate cancer.  His PSA level remains very low at 0.1 today.  This suggests that the combination of Lupron/darolutamide continues to keep his disease under ideal control.  His current darolutamide dose is 300 mg every Monday, Wednesday, and Friday.   He will continue to receive Lupron injections every 3 months, including a shot today.  As his disease remains under control and he is clinically doing well, I will see him back in another 3 months for repeat clinical assessment.  The patient understands all the plans discussed today and is in agreement with them.     I, Rita Ohara, am acting as scribe for Marice Potter, MD    I have reviewed this report as typed by the medical scribe, and it is complete and accurate.  Dequincy Macarthur Critchley, MD

## 2021-12-25 ENCOUNTER — Other Ambulatory Visit: Payer: Self-pay | Admitting: Hematology and Oncology

## 2021-12-25 ENCOUNTER — Inpatient Hospital Stay: Payer: Medicare Other | Attending: Oncology

## 2021-12-25 ENCOUNTER — Other Ambulatory Visit: Payer: Self-pay

## 2021-12-25 DIAGNOSIS — Z79899 Other long term (current) drug therapy: Secondary | ICD-10-CM | POA: Insufficient documentation

## 2021-12-25 DIAGNOSIS — C785 Secondary malignant neoplasm of large intestine and rectum: Secondary | ICD-10-CM | POA: Diagnosis not present

## 2021-12-25 DIAGNOSIS — Z5111 Encounter for antineoplastic chemotherapy: Secondary | ICD-10-CM | POA: Diagnosis not present

## 2021-12-25 DIAGNOSIS — C774 Secondary and unspecified malignant neoplasm of inguinal and lower limb lymph nodes: Secondary | ICD-10-CM | POA: Diagnosis not present

## 2021-12-25 DIAGNOSIS — C61 Malignant neoplasm of prostate: Secondary | ICD-10-CM | POA: Insufficient documentation

## 2021-12-25 DIAGNOSIS — D649 Anemia, unspecified: Secondary | ICD-10-CM | POA: Diagnosis not present

## 2021-12-25 LAB — CBC AND DIFFERENTIAL
HCT: 43 (ref 41–53)
Hemoglobin: 14.2 (ref 13.5–17.5)
Neutrophils Absolute: 5.4
Platelets: 327 (ref 150–399)
WBC: 9

## 2021-12-25 LAB — COMPREHENSIVE METABOLIC PANEL
Albumin: 4.4 (ref 3.5–5.0)
Calcium: 9.4 (ref 8.7–10.7)

## 2021-12-25 LAB — BASIC METABOLIC PANEL
BUN: 36 — AB (ref 4–21)
CO2: 30 — AB (ref 13–22)
Chloride: 100 (ref 99–108)
Creatinine: 1.5 — AB (ref 0.6–1.3)
Glucose: 97
Potassium: 3.5 (ref 3.4–5.3)
Sodium: 139 (ref 137–147)

## 2021-12-25 LAB — CBC
MCV: 88 (ref 80–94)
RBC: 4.87 (ref 3.87–5.11)

## 2021-12-25 LAB — HEPATIC FUNCTION PANEL
ALT: 18 (ref 10–40)
AST: 27 (ref 14–40)
Alkaline Phosphatase: 93 (ref 25–125)
Bilirubin, Total: 0.7

## 2021-12-25 LAB — PSA: Prostatic Specific Antigen: 0.1 ng/mL (ref 0.00–4.00)

## 2021-12-26 ENCOUNTER — Ambulatory Visit: Payer: Medicare Other | Admitting: Family Medicine

## 2021-12-26 ENCOUNTER — Inpatient Hospital Stay (INDEPENDENT_AMBULATORY_CARE_PROVIDER_SITE_OTHER): Payer: Medicare Other | Admitting: Oncology

## 2021-12-26 ENCOUNTER — Other Ambulatory Visit: Payer: Self-pay | Admitting: Oncology

## 2021-12-26 ENCOUNTER — Inpatient Hospital Stay: Payer: Medicare Other

## 2021-12-26 VITALS — BP 130/80 | HR 65 | Temp 97.6°F | Resp 16 | Ht 70.0 in | Wt 197.5 lb

## 2021-12-26 VITALS — BP 131/75 | HR 68 | Temp 97.8°F | Resp 16 | Ht 70.0 in | Wt 191.5 lb

## 2021-12-26 DIAGNOSIS — Z79899 Other long term (current) drug therapy: Secondary | ICD-10-CM | POA: Diagnosis not present

## 2021-12-26 DIAGNOSIS — C61 Malignant neoplasm of prostate: Secondary | ICD-10-CM

## 2021-12-26 DIAGNOSIS — C774 Secondary and unspecified malignant neoplasm of inguinal and lower limb lymph nodes: Secondary | ICD-10-CM | POA: Diagnosis not present

## 2021-12-26 DIAGNOSIS — Z5111 Encounter for antineoplastic chemotherapy: Secondary | ICD-10-CM | POA: Diagnosis not present

## 2021-12-26 DIAGNOSIS — C785 Secondary malignant neoplasm of large intestine and rectum: Secondary | ICD-10-CM | POA: Diagnosis not present

## 2021-12-26 MED ORDER — LEUPROLIDE ACETATE (3 MONTH) 22.5 MG IM KIT
22.5000 mg | PACK | Freq: Once | INTRAMUSCULAR | Status: AC
  Administered 2021-12-26: 22.5 mg via INTRAMUSCULAR
  Filled 2021-12-26: qty 22.5

## 2021-12-26 NOTE — Patient Instructions (Signed)
Leuprolide injection What is this medication? LEUPROLIDE (loo PROE lide) is a man-made hormone. It is used to treat the symptoms of prostate cancer. This medicine may also be used to treat children with early onset of puberty. It may be used for other hormonal conditions. This medicine may be used for other purposes; ask your health care provider or pharmacist if you have questions. COMMON BRAND NAME(S): Lupron What should I tell my care team before I take this medication? They need to know if you have any of these conditions: diabetes heart disease or previous heart attack high blood pressure high cholesterol pain or difficulty passing urine spinal cord metastasis stroke tobacco smoker an unusual or allergic reaction to leuprolide, benzyl alcohol, other medicines, foods, dyes, or preservatives pregnant or trying to get pregnant breast-feeding How should I use this medication? This medicine is for injection under the skin or into a muscle. You will be taught how to prepare and give this medicine. Use exactly as directed. Take your medicine at regular intervals. Do not take your medicine more often than directed. It is important that you put your used needles and syringes in a special sharps container. Do not put them in a trash can. If you do not have a sharps container, call your pharmacist or healthcare provider to get one. A special MedGuide will be given to you by the pharmacist with each prescription and refill. Be sure to read this information carefully each time. Talk to your pediatrician regarding the use of this medicine in children. While this medicine may be prescribed for children as young as 8 years for selected conditions, precautions do apply. Overdosage: If you think you have taken too much of this medicine contact a poison control center or emergency room at once. NOTE: This medicine is only for you. Do not share this medicine with others. What if I miss a dose? If you miss  a dose, take it as soon as you can. If it is almost time for your next dose, take only that dose. Do not take double or extra doses. What may interact with this medication? Do not take this medicine with any of the following medications: chasteberry cisapride dronedarone pimozide thioridazine This medicine may also interact with the following medications: herbal or dietary supplements, like black cohosh or DHEA male hormones, like estrogens or progestins and birth control pills, patches, rings, or injections male hormones, like testosterone other medicines that prolong the QT interval (abnormal heart rhythm) This list may not describe all possible interactions. Give your health care provider a list of all the medicines, herbs, non-prescription drugs, or dietary supplements you use. Also tell them if you smoke, drink alcohol, or use illegal drugs. Some items may interact with your medicine. What should I watch for while using this medication? Visit your doctor or health care professional for regular checks on your progress. During the first week, your symptoms may get worse, but then will improve as you continue your treatment. You may get hot flashes, increased bone pain, increased difficulty passing urine, or an aggravation of nerve symptoms. Discuss these effects with your doctor or health care professional, some of them may improve with continued use of this medicine. Male patients may experience a menstrual cycle or spotting during the first 2 months of therapy with this medicine. If this continues, contact your doctor or health care professional. This medicine may increase blood sugar. Ask your healthcare provider if changes in diet or medicines are needed if you have  diabetes. What side effects may I notice from receiving this medication? Side effects that you should report to your doctor or health care professional as soon as possible: allergic reactions like skin rash, itching or  hives, swelling of the face, lips, or tongue breathing problems chest pain depression or memory disorders pain in your legs or groin pain at site where injected severe headache signs and symptoms of high blood sugar such as being more thirsty or hungry or having to urinate more than normal. You may also feel very tired or have blurry vision swelling of the feet and legs visual changes vomiting Side effects that usually do not require medical attention (report to your doctor or health care professional if they continue or are bothersome): breast swelling or tenderness decrease in sex drive or performance diarrhea hot flashes loss of appetite muscle, joint, or bone pains nausea redness or irritation at site where injected skin problems or acne This list may not describe all possible side effects. Call your doctor for medical advice about side effects. You may report side effects to FDA at 1-800-FDA-1088. Where should I keep my medication? Keep out of the reach of children. Store below 25 degrees C (77 degrees F). Do not freeze. Protect from light. Do not use if it is not clear or if there are particles present. Throw away any unused medicine after the expiration date. NOTE: This sheet is a summary. It may not cover all possible information. If you have questions about this medicine, talk to your doctor, pharmacist, or health care provider.  2022 Elsevier/Gold Standard (2021-08-19 00:00:00)

## 2021-12-26 NOTE — Progress Notes (Signed)
1047:PT STABLE AT TIME OF DISCHARGE °

## 2021-12-29 DIAGNOSIS — L209 Atopic dermatitis, unspecified: Secondary | ICD-10-CM | POA: Diagnosis not present

## 2021-12-31 ENCOUNTER — Other Ambulatory Visit: Payer: Self-pay | Admitting: Hematology and Oncology

## 2021-12-31 MED ORDER — APIXABAN 5 MG PO TABS: 5.0000 mg | ORAL_TABLET | Freq: Two times a day (BID) | ORAL | 3 refills | Status: DC

## 2022-01-01 NOTE — Progress Notes (Signed)
Patient was denied free Eliquis through [facility] due to him not meeting the 3% out of pocket prescription expenses. The cost of his Eliquis is $600 for his next 90 day supply. Enrolled him into the [facility], will cover this cost through [employer].

## 2022-01-06 ENCOUNTER — Other Ambulatory Visit: Payer: Self-pay | Admitting: Family Medicine

## 2022-01-06 NOTE — Telephone Encounter (Signed)
I certainly prefer to not approve or deny any of his medications ;-P

## 2022-01-12 ENCOUNTER — Other Ambulatory Visit: Payer: Self-pay | Admitting: Family Medicine

## 2022-01-15 NOTE — Progress Notes (Signed)
Patient was approved for free Nubeqa through [employer] [place] [employer]. Case Number: [id number] 01/14/2022 - 12/13/2022

## 2022-01-21 DIAGNOSIS — L209 Atopic dermatitis, unspecified: Secondary | ICD-10-CM | POA: Diagnosis not present

## 2022-01-23 ENCOUNTER — Other Ambulatory Visit: Payer: Self-pay

## 2022-01-23 ENCOUNTER — Ambulatory Visit (INDEPENDENT_AMBULATORY_CARE_PROVIDER_SITE_OTHER): Payer: Medicare Other | Admitting: Family Medicine

## 2022-01-23 VITALS — BP 104/54 | HR 56 | Temp 96.5°F | Resp 16 | Ht 67.0 in | Wt 194.0 lb

## 2022-01-23 DIAGNOSIS — E559 Vitamin D deficiency, unspecified: Secondary | ICD-10-CM | POA: Diagnosis not present

## 2022-01-23 DIAGNOSIS — I5032 Chronic diastolic (congestive) heart failure: Secondary | ICD-10-CM | POA: Diagnosis not present

## 2022-01-23 DIAGNOSIS — F0390 Unspecified dementia without behavioral disturbance: Secondary | ICD-10-CM | POA: Insufficient documentation

## 2022-01-23 DIAGNOSIS — I110 Hypertensive heart disease with heart failure: Secondary | ICD-10-CM

## 2022-01-23 DIAGNOSIS — R202 Paresthesia of skin: Secondary | ICD-10-CM

## 2022-01-23 DIAGNOSIS — R5383 Other fatigue: Secondary | ICD-10-CM | POA: Diagnosis not present

## 2022-01-23 DIAGNOSIS — R413 Other amnesia: Secondary | ICD-10-CM | POA: Diagnosis not present

## 2022-01-23 DIAGNOSIS — F0280 Dementia in other diseases classified elsewhere without behavioral disturbance: Secondary | ICD-10-CM | POA: Insufficient documentation

## 2022-01-23 DIAGNOSIS — R531 Weakness: Secondary | ICD-10-CM | POA: Diagnosis not present

## 2022-01-23 DIAGNOSIS — Z6830 Body mass index (BMI) 30.0-30.9, adult: Secondary | ICD-10-CM

## 2022-01-23 NOTE — Patient Instructions (Signed)
Stop chlorthalidone.  Labs today.   Compression socks.  Low salt diet.  Elevate legs.

## 2022-01-23 NOTE — Progress Notes (Signed)
Subjective:  Patient ID: Marcus King, male    DOB: 05/03/1941  Age: 81 y.o. MRN: [id number]  Chief Complaint  Patient presents with   Fatigue   Dizziness    HPI No energy x several months. Weakness and fatigue. Memory is worsening. Patient has OSA with CPAP. Has had his cpap checked within the last year and settings were correct. Patient appeared pale to me, but wife says he always looks like this. BP 175/80 then takes carvedilol 25 mg 1/2 pill and drops it 110/80s. BP is very labile. Denies chest pain or sob.  Most recent medicine started was chlorthalidone a couple of months ago. Patient has been on prostate cancer medicines for the last year (Nubeqa, lupron.)  Current Outpatient Medications on File Prior to Visit  Medication Sig Dispense Refill   amLODipine (NORVASC) 5 MG tablet Take 1 tablet (5 mg total) by mouth daily. (Patient taking differently: Take 10 mg by mouth daily.) 90 tablet 0   apixaban (ELIQUIS) 5 MG TABS tablet Take 1 tablet (5 mg total) by mouth in the morning and at bedtime. 180 tablet 3   busPIRone (BUSPAR) 30 MG tablet Take 1 tablet (30 mg total) by mouth 2 (two) times daily. (Patient taking differently: Take 15 mg by mouth in the morning and at bedtime.) 60 tablet 0   carvedilol (COREG) 25 MG tablet take 1 tablet (25 mg) by oral route 2 times per day with food 180 tablet 0   chlorthalidone (HYGROTON) 25 MG tablet Take 1 tablet (25 mg total) by mouth daily. 90 tablet 1   darolutamide (NUBEQA) 300 MG tablet Take 1 tablet (300 mg total) by mouth daily. 3 days each week 120 tablet 3   Dupilumab (DUPIXENT ) Inject into the skin.     furosemide (LASIX) 20 MG tablet Take 2 tablets (40 mg total) by mouth daily. 180 tablet 0   hydrOXYzine (ATARAX/VISTARIL) 25 MG tablet Take 1 tablet (25 mg total) by mouth every 8 (eight) hours as needed. 270 tablet 1   Leuprolide Acetate, 3 Month, (LUPRON DEPOT, 77-MONTH, IM) Inject into the muscle.     lisinopril (ZESTRIL) 20 MG  tablet Take 1 tablet (20 mg total) by mouth in the morning and at bedtime. 180 tablet 1   loratadine (CLARITIN) 10 MG tablet Take by mouth.     mupirocin ointment (BACTROBAN) 2 % Apply topically 3 (three) times daily.     PARoxetine (PAXIL) 40 MG tablet Take 1 tablet (40 mg total) by mouth daily. 90 tablet 1   pravastatin (PRAVACHOL) 20 MG tablet Take 1 tablet (20 mg total) by mouth daily. 90 tablet 1   augmented betamethasone dipropionate (DIPROLENE) 0.05 % ointment Apply topically 2 (two) times daily. 100 g 1   triamcinolone cream (KENALOG) 0.1 % Apply topically 2 (two) times daily as needed.     triamcinolone ointment (KENALOG) 0.5 % Apply topically.     No current facility-administered medications on file prior to visit.   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, NYHA class 1 ([place]) 09/21/2018   Dyslipidemia 08/11/2017   Dyspnea on exertion 04/23/2020   Edema extremities 04/14/2018   Elevated glucose 10/17/2019   Fatigue 04/23/2020   Food poisoning    GAD (generalized anxiety disorder)  H/O deep venous thrombosis 04/14/2021   Headache    sinus   History of pulmonary embolism 06/13/2020   History of pulmonary embolus (PE) 06/13/2020   Hypertension    Hypertensive heart disease 08/05/2017   Hypoxia 10/17/2019   Inguinal lymphadenopathy 05/26/2020   LVH (left ventricular hypertrophy) due to hypertensive disease 08/11/2017   Malignant neoplasm of prostate ([place]) 09/09/2020   Mixed hyperlipidemia    Orthostatic hypotension    Orthostatic syncope 10/17/2019   OSA on CPAP    Prostate cancer ([place])    Pulmonary embolism ([place]) 04/2020   PVC's (premature ventricular contractions) 05/18/2018   Rectal mass 05/24/2020   S/P TKR (total knee replacement) using cement, 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   Solid nodule  of lung greater than 8 mm in diameter 05/26/2020   Past Surgical History:  Procedure Laterality Date   CARDIAC CATHETERIZATION     CATARACT EXTRACTION W/ INTRAOCULAR LENS  IMPLANT, BILATERAL Bilateral    COLON SURGERY  2014   COLOSTOMY REVERSAL  2014   HERNIA REPAIR     x2   JOINT REPLACEMENT     PROSTATE SURGERY  2006   SEPTOPLASTY     TOTAL KNEE ARTHROPLASTY Left 09/28/2016   Procedure: TOTAL KNEE ARTHROPLASTY;  Surgeon: [name], MD;  Location: [facility];  Service: Orthopedics;  Laterality: Left;    Family History  Problem Relation Age of Onset   Stroke Mother    Parkinson's disease Mother    Lung disease Father    Stroke Father    Alcoholism Father    Leukemia Sister    Muscular dystrophy Son        Becker's   Alzheimer's disease Neg Hx    Social History   Socioeconomic History   Marital status: Married    Spouse name: Not on file   Number of children: 2   Years of education: Not on file   Highest education level: High school graduate  Occupational History   Occupation: Engineer, petroleum    Comment: ICDs/Wound Care  Tobacco Use   Smoking status: Former    Years: 20.00    Types: Cigarettes    Quit date: 1979    Years since quitting: 44.1   Smokeless tobacco: Never   Tobacco comments:    quit 37 years ago  Vaping Use   Vaping Use: Never used  Substance and Sexual Activity   Alcohol use: No   Drug use: No   Sexual activity: Not on file  Other Topics Concern   Not on file  Social History Narrative   LIves at home with his wife   Self employed   Right handed   Drinks about 2 cups of caffeine daily   Social Determinants of Health   Financial Resource Strain: Not on file  Food Insecurity: Not on file  Transportation Needs: Not on file  Physical Activity: Not on file  Stress: Not on file  Social Connections: Not on file    Review of Systems  Constitutional:  Positive for fatigue. Negative for chills and fever.  HENT:  Negative for congestion,  rhinorrhea and sore throat.   Respiratory:  Negative for cough and shortness of breath.   Cardiovascular:  Negative for chest pain and palpitations.  Gastrointestinal:  Negative for abdominal pain, constipation, diarrhea, nausea and vomiting.  Genitourinary:  Negative for dysuria and urgency.       Poor bladder control   Musculoskeletal:  Negative for arthralgias, back pain and myalgias.  Neurological:  Positive  for dizziness, weakness and headaches.  Psychiatric/Behavioral:  Negative for dysphoric mood. The patient is not nervous/anxious.     Objective:  BP (!) 104/54    Pulse (!) 56    Temp (!) 96.5 F (35.8 C)    Resp 16    Ht 5\' 7"  (1.702 m)    Wt 194 lb (88 kg)    BMI 30.38 kg/m   BP/Weight 01/23/2022 12/26/2021 08/08/4157  Systolic BP 309 407 680  Diastolic BP 54 80 75  Wt. (Lbs) 194 197.5 191.5  BMI 30.38 28.34 27.48    Physical Exam Vitals reviewed.  Constitutional:      Appearance: Normal appearance.     Comments: pale  Eyes:     Comments: Conjunctivae pale.   Neck:     Vascular: No carotid bruit.  Cardiovascular:     Rate and Rhythm: Normal rate and regular rhythm.     Heart sounds: Normal heart sounds.  Pulmonary:     Effort: Pulmonary effort is normal.     Breath sounds: Normal breath sounds. No wheezing, rhonchi or rales.  Skin:    Comments: Skin pale.  Neurological:     Mental Status: He is alert and oriented to person, place, and time.  Psychiatric:        Mood and Affect: Mood normal.        Behavior: Behavior normal.    Diabetic Foot Exam - Simple   No data filed      Lab Results  Component Value Date   WBC 9.0 12/25/2021   HGB 14.2 12/25/2021   HCT 43 12/25/2021   PLT 327 12/25/2021   GLUCOSE 94 11/25/2021   CHOL 131 07/31/2021   TRIG 79 07/31/2021   HDL 51 07/31/2021   LDLCALC 64 07/31/2021   ALT 18 12/25/2021   AST 27 12/25/2021   NA 139 12/25/2021   K 3.5 12/25/2021   CL 100 12/25/2021   CREATININE 1.5 (A) 12/25/2021   BUN 36 (A)  12/25/2021   CO2 30 (A) 12/25/2021   TSH 1.300 01/07/2021   INR 1.03 01/09/2019   HGBA1C 5.6 12/01/2017      Assessment & Plan:   Problem List Items Addressed This Visit       Cardiovascular and Mediastinum   Hypertensive heart disease with chronic diastolic congestive heart failure ([place]) - Primary    Stop chlorthalidone.  Continue other medicines.   To prevent edema: Compression socks.  Low salt diet.  Elevate legs.       Relevant Orders   CBC with Differential/Platelet   Comprehensive metabolic panel     Other   Paresthesia    Check labs      Relevant Orders   Methylmalonic acid, serum   Memory loss    Check b12 and folate.      Relevant Orders   B12 and Folate Panel   TSH   VITAMIN D 25 Hydroxy (Vit-D Deficiency, Fractures)   Methylmalonic acid, serum   Other fatigue    Stop chlorthalidone. Certainly lupron could be causing fatigue also. Check labs.       Relevant Orders   B12 and Folate Panel   VITAMIN D 25 Hydroxy (Vit-D Deficiency, Fractures)   Methylmalonic acid, serum   Vitamin D deficiency    Check vitamin D level.       Relevant Orders   VITAMIN D 25 Hydroxy (Vit-D Deficiency, Fractures)   Other Visit Diagnoses     BMI 30.0-30.9,adult  Relevant Orders   VITAMIN D 25 Hydroxy (Vit-D Deficiency, Fractures)     .  No orders of the defined types were placed in this encounter.   Orders Placed This Encounter  Procedures   B12 and Folate Panel   CBC with Differential/Platelet   Comprehensive metabolic panel   TSH   VITAMIN D 25 Hydroxy (Vit-D Deficiency, Fractures)   Methylmalonic acid, serum     Follow-up: Return in about 6 weeks (around 03/06/2022).  An After Visit Summary was printed and given to the patient.  Rochel Brome, MD [facility] [phone]

## 2022-01-25 ENCOUNTER — Encounter: Payer: Self-pay | Admitting: Family Medicine

## 2022-01-25 NOTE — Assessment & Plan Note (Signed)
Check b12 and folate.

## 2022-01-25 NOTE — Assessment & Plan Note (Signed)
Stop chlorthalidone.  Continue other medicines.   To prevent edema: Compression socks.  Low salt diet.  Elevate legs.

## 2022-01-25 NOTE — Assessment & Plan Note (Signed)
Check labs 

## 2022-01-25 NOTE — Assessment & Plan Note (Addendum)
Stop chlorthalidone. Certainly lupron could be causing fatigue also. Check labs.

## 2022-01-25 NOTE — Assessment & Plan Note (Signed)
Check vitamin D level 

## 2022-01-27 LAB — CBC WITH DIFFERENTIAL/PLATELET
Basophils Absolute: 0 10*3/uL (ref 0.0–0.2)
Basos: 0 %
EOS (ABSOLUTE): 0.6 10*3/uL — ABNORMAL HIGH (ref 0.0–0.4)
Eos: 7 %
Hematocrit: 40.7 % (ref 37.5–51.0)
Hemoglobin: 13.8 g/dL (ref 13.0–17.7)
Immature Grans (Abs): 0 10*3/uL (ref 0.0–0.1)
Immature Granulocytes: 0 %
Lymphocytes Absolute: 1.8 10*3/uL (ref 0.7–3.1)
Lymphs: 21 %
MCH: 30.1 pg (ref 26.6–33.0)
MCHC: 33.9 g/dL (ref 31.5–35.7)
MCV: 89 fL (ref 79–97)
Monocytes Absolute: 0.9 10*3/uL (ref 0.1–0.9)
Monocytes: 11 %
Neutrophils Absolute: 5.3 10*3/uL (ref 1.4–7.0)
Neutrophils: 61 %
Platelets: 307 10*3/uL (ref 150–450)
RBC: 4.59 x10E6/uL (ref 4.14–5.80)
RDW: 13 % (ref 11.6–15.4)
WBC: 8.6 10*3/uL (ref 3.4–10.8)

## 2022-01-27 LAB — COMPREHENSIVE METABOLIC PANEL
ALT: 12 IU/L (ref 0–44)
AST: 20 IU/L (ref 0–40)
Albumin/Globulin Ratio: 1.8 (ref 1.2–2.2)
Albumin: 4.2 g/dL (ref 3.6–4.6)
Alkaline Phosphatase: 105 IU/L (ref 44–121)
BUN/Creatinine Ratio: 19 (ref 10–24)
BUN: 30 mg/dL — ABNORMAL HIGH (ref 8–27)
Bilirubin Total: 0.3 mg/dL (ref 0.0–1.2)
CO2: 25 mmol/L (ref 20–29)
Calcium: 9.6 mg/dL (ref 8.6–10.2)
Chloride: 99 mmol/L (ref 96–106)
Creatinine, Ser: 1.57 mg/dL — ABNORMAL HIGH (ref 0.76–1.27)
Globulin, Total: 2.3 g/dL (ref 1.5–4.5)
Glucose: 101 mg/dL — ABNORMAL HIGH (ref 70–99)
Potassium: 4 mmol/L (ref 3.5–5.2)
Sodium: 140 mmol/L (ref 134–144)
Total Protein: 6.5 g/dL (ref 6.0–8.5)
eGFR: 44 mL/min/{1.73_m2} — ABNORMAL LOW (ref >59)

## 2022-01-27 LAB — METHYLMALONIC ACID, SERUM: Methylmalonic Acid: 322 nmol/L (ref 0–378)

## 2022-01-27 LAB — B12 AND FOLATE PANEL
Folate: 15.8 ng/mL (ref >3.0)
Vitamin B-12: 408 pg/mL (ref 232–1245)

## 2022-01-27 LAB — VITAMIN D 25 HYDROXY (VIT D DEFICIENCY, FRACTURES): Vit D, 25-Hydroxy: 23.8 ng/mL — ABNORMAL LOW (ref 30.0–100.0)

## 2022-01-27 LAB — TSH: TSH: 1.26 u[IU]/mL (ref 0.450–4.500)

## 2022-01-28 ENCOUNTER — Other Ambulatory Visit: Payer: Self-pay

## 2022-01-28 MED ORDER — VITAMIN D (ERGOCALCIFEROL) 1.25 MG (50000 UNIT) PO CAPS: 50000.0000 [IU] | ORAL_CAPSULE | ORAL | 3 refills | Status: DC

## 2022-02-04 ENCOUNTER — Other Ambulatory Visit: Payer: Self-pay

## 2022-02-04 ENCOUNTER — Ambulatory Visit (INDEPENDENT_AMBULATORY_CARE_PROVIDER_SITE_OTHER): Payer: Medicare Other

## 2022-02-04 VITALS — BP 118/58 | HR 60 | Resp 16 | Ht 70.0 in | Wt 199.4 lb

## 2022-02-04 DIAGNOSIS — Z0000 Encounter for general adult medical examination without abnormal findings: Secondary | ICD-10-CM

## 2022-02-04 NOTE — Patient Instructions (Signed)

## 2022-02-04 NOTE — Progress Notes (Signed)
Subjective:   Marcus King is a 81 y.o. male who presents for Medicare Annual/Subsequent preventive examination.  This wellness visit is conducted by a nurse.  The patient's medications were reviewed and reconciled since the patient's last visit.  History details were provided by the patient and his wife.  The history appears to be reliable.    Patient's last AWV was more than one year ago.   Medical History: Patient history and Family history was reviewed  Medications, Allergies, and preventative health maintenance was reviewed and updated.  Cardiac Risk Factors include: advanced age (>56men, >35 women);dyslipidemia;hypertension;sedentary lifestyle     Objective:    Today's Vitals   02/04/22 0955  BP: (!) 118/58  Pulse: 60  Resp: 16  SpO2: 96%  Weight: 199 lb 6.4 oz (90.4 kg)  Height: 5\' 10"  (1.778 m)  PainSc: 0-No pain   Body mass index is 28.61 kg/m.  Advanced Directives 02/04/2022 12/26/2021 09/26/2021 06/27/2021 03/28/2021 02/24/2021 01/27/2021  Does Patient Have a Medical Advance Directive? Yes Yes Yes Yes Yes Yes Yes  Type of Advance Directive Living will - - - [place];Living will [place];Living will [place];Living will  Does patient want to make changes to medical advance directive? No - Patient declined - - - - - -  Copy of [place] in Chart? - - - - - - -    Current Medications (verified) Outpatient Encounter Medications as of 02/04/2022  Medication Sig   amLODipine (NORVASC) 5 MG tablet Take 1 tablet (5 mg total) by mouth daily. (Patient taking differently: Take 10 mg by mouth daily.)   apixaban (ELIQUIS) 5 MG TABS tablet Take 1 tablet (5 mg total) by mouth in the morning and at bedtime.   augmented betamethasone dipropionate (DIPROLENE) 0.05 % ointment Apply topically 2 (two) times daily.   busPIRone (BUSPAR) 30 MG tablet Take 1 tablet (30 mg total) by mouth 2 (two) times daily.  (Patient taking differently: Take 15 mg by mouth in the morning and at bedtime.)   carvedilol (COREG) 25 MG tablet take 1 tablet (25 mg) by oral route 2 times per day with food   clobetasol cream (TEMOVATE) 0.05 % Apply topically 2 (two) times daily as needed.   darolutamide (NUBEQA) 300 MG tablet Take 1 tablet (300 mg total) by mouth daily. 3 days each week   Dupilumab (DUPIXENT [place]) Inject into the skin.   furosemide (LASIX) 20 MG tablet Take 2 tablets (40 mg total) by mouth daily.   hydrOXYzine (ATARAX/VISTARIL) 25 MG tablet Take 1 tablet (25 mg total) by mouth every 8 (eight) hours as needed.   Leuprolide Acetate, 3 Month, (LUPRON DEPOT, 55-MONTH, IM) Inject into the muscle.   lisinopril (ZESTRIL) 20 MG tablet Take 1 tablet (20 mg total) by mouth in the morning and at bedtime.   loratadine (CLARITIN) 10 MG tablet Take by mouth.   mupirocin ointment (BACTROBAN) 2 % Apply topically 3 (three) times daily.   PARoxetine (PAXIL) 40 MG tablet Take 1 tablet (40 mg total) by mouth daily.   pravastatin (PRAVACHOL) 20 MG tablet Take 1 tablet (20 mg total) by mouth daily.   triamcinolone cream (KENALOG) 0.1 % Apply topically 2 (two) times daily as needed.   triamcinolone ointment (KENALOG) 0.5 % Apply topically.   Vitamin D, Ergocalciferol, (DRISDOL) 1.25 MG (50000 UNIT) CAPS capsule Take 1 capsule (50,000 Units total) by mouth every 7 (seven) days.   [DISCONTINUED] chlorthalidone (HYGROTON)  25 MG tablet Take 1 tablet (25 mg total) by mouth daily.   No facility-administered encounter medications on file as of 02/04/2022.    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, NYHA class 1  ([place]) 09/21/2018   Dyslipidemia 08/11/2017   Dyspnea on exertion 04/23/2020   Edema extremities 04/14/2018   Elevated glucose 10/17/2019   Fatigue 04/23/2020   Food poisoning    GAD (generalized anxiety disorder)    H/O deep venous thrombosis 04/14/2021   Headache    sinus   History of pulmonary embolism 06/13/2020   History of pulmonary embolus (PE) 06/13/2020   Hypertension    Hypertensive heart disease 08/05/2017   Hypoxia 10/17/2019   Inguinal lymphadenopathy 05/26/2020   LVH (left ventricular hypertrophy) due to hypertensive disease 08/11/2017   Malignant neoplasm of prostate ([place]) 09/09/2020   Mixed hyperlipidemia    Orthostatic hypotension    Orthostatic syncope 10/17/2019   OSA on CPAP    Prostate cancer ([place])    Pulmonary embolism ([place]) 04/2020   PVC's (premature ventricular contractions) 05/18/2018   Rectal mass 05/24/2020   S/P TKR (total knee replacement) using cement, 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   Solid nodule of lung greater than 8 mm in diameter 05/26/2020   Past Surgical History:  Procedure Laterality Date   CARDIAC CATHETERIZATION     CATARACT EXTRACTION W/ INTRAOCULAR LENS  IMPLANT, BILATERAL Bilateral    COLON SURGERY  2014   COLOSTOMY REVERSAL  2014   HERNIA REPAIR     x2   JOINT REPLACEMENT     PROSTATE SURGERY  2006   SEPTOPLASTY     TOTAL KNEE ARTHROPLASTY Left 09/28/2016   Procedure: TOTAL KNEE ARTHROPLASTY;  Surgeon: [name], MD;  Location: [facility];  Service: Orthopedics;  Laterality: Left;   Family History  Problem Relation Age of Onset   Stroke Mother    Parkinson's disease Mother    Lung disease Father    Stroke Father    Alcoholism Father    Leukemia Sister    Muscular dystrophy Son        Becker's   Alzheimer's disease Neg Hx    Social History   Socioeconomic History   Marital status: Married    Spouse name: Hoyle Sauer   Number of children: 2   Years of education: Not on file   Highest education level: High school graduate  Occupational History   Occupation:  Engineer, petroleum    Comment: ICDs/Wound Care  Tobacco Use   Smoking status: Former    Years: 20.00    Types: Cigarettes    Quit date: 1979    Years since quitting: 44.1   Smokeless tobacco: Never  Vaping Use   Vaping Use: Never used  Substance and Sexual Activity   Alcohol use: No   Drug use: Never   Sexual activity: Not on file  Other Topics Concern   Not on file  Social History Narrative   LIves at home with his wife   Daughter lives in [facility], Son lives in [place]   Right handed   Drinks about 2 cups of caffeine daily   Social Determinants of Health   Financial Resource Strain: Low Risk  Difficulty of Paying Living Expenses: Not very hard  Food Insecurity: No Food Insecurity   Worried About Running Out of Food in the Last Year: Never true   Ran Out of Food in the Last Year: Never true  Transportation Needs: No Transportation Needs   Lack of Transportation (Medical): No   Lack of Transportation (Non-Medical): No  Physical Activity: Insufficiently Active   Days of Exercise per Week: 2 days   Minutes of Exercise per Session: 10 min  Stress: Not on file  Social Connections: Socially Integrated   Frequency of Communication with Friends and Family: More than three times a week   Frequency of Social Gatherings with Friends and Family: Twice a week   Attends Religious Services: More than 4 times per year   Active Member of [employer] or Organizations: Yes   Attends Music therapist: More than 4 times per year   Marital Status: Married    Tobacco Counseling Counseling given: No tobacco products used    Clinical Intake:  Pre-visit preparation completed: Yes Pain : No/denies pain Pain Score: 0-No pain   BMI - recorded: 28.61 Nutritional Status: BMI 25 -29 Overweight Nutritional Risks: None Diabetes: No How often do you need to have someone help you when you read instructions, pamphlets, or other written materials from your doctor or pharmacy?:  2 - Rarely Interpreter Needed?: No   Activities of Daily Living In your present state of health, do you have any difficulty performing the following activities: 02/04/2022  Hearing? Y  Vision? N  Difficulty concentrating or making decisions? Y  Comment trouble recalling names  Walking or climbing stairs? Y  Dressing or bathing? N  Doing errands, shopping? N  Preparing Food and eating ? N  Using the Toilet? N  In the past six months, have you accidently leaked urine? N  Do you have problems with loss of bowel control? N  Managing your Medications? N  Managing your Finances? N  Housekeeping or managing your Housekeeping? N  Some recent data might be hidden    Patient Care Team: Rochel Brome, MD as PCP - General (Family Medicine) Marice Potter, MD as PCP - Hematology/Oncology (Oncology) Burnice Logan, [facility] (Inactive) as Pharmacist (Pharmacist) Jolene Schimke, MD as Referring Physician (Dermatology) Edrick Oh, MD as Consulting Physician (Nephrology) Flossie Buffy., MD as Referring Physician (Cardiology)  Indicate any recent Medical Services you may have received from other than [facility] providers in the past year (date may be approximate). Dr Otho Perl 07/2021 Dr Lorita Officer Michele Mcalpine 11/2021    Assessment:   This is a routine wellness examination for [place].  Hearing/Vision screen No results found.  Dietary issues and exercise activities discussed: Current Exercise Habits: The patient does not participate in regular exercise at present, Exercise limited by: Other - see comments (fatigue)  Depression Screen PHQ 2/9 Scores 02/04/2022 09/24/2021 11/27/2020 05/08/2020 04/23/2020  PHQ - 2 Score 0 0 0 0 0    Fall Risk Fall Risk  02/04/2022 09/24/2021 03/07/2021 11/27/2020 11/08/2019  Falls in the past year? 0 0 1 1 1   Comment - - - - Emmi Telephone Survey: data to providers prior to load  Number falls in past yr: 0 0 0 1 1  Comment - - - - Emmi Telephone Survey Actual Response = 2   Injury with Fall? 0 0 0 1 1  Risk for fall due to : Impaired balance/gait No Fall Risks No Fall Risks Medication side effect -  Follow up  Falls evaluation completed;Falls prevention discussed;Education provided Falls evaluation completed Falls evaluation completed - -    FALL RISK PREVENTION PERTAINING TO THE HOME:  Any stairs in or around the home? Yes  If so, are there any without handrails? No  Home free of loose throw rugs in walkways, pet beds, electrical cords, etc? Yes  Adequate lighting in your home to reduce risk of falls? Yes   ASSISTIVE DEVICES UTILIZED TO PREVENT FALLS:  Use of a cane, walker or w/c? No  (he has a walker and wheel chair at home if needed) Gait slow and steady without use of assistive device  Cognitive Function: MMSE - Mini Mental State Exam 01/23/2022 12/01/2017  Orientation to time 5 5  Orientation to Place 5 5  Registration 3 3  Attention/ Calculation 4 5  Recall 2 1  Language- name 2 objects 2 2  Language- repeat 1 1  Language- follow 3 step command 3 3  Language- read & follow direction 1 1  Write a sentence 1 1  Copy design 1 1  Total score 28 28     6CIT Screen 02/04/2022 11/27/2020  What Year? 0 points 0 points  What month? 0 points 0 points  What time? 0 points 0 points  Count back from 20 0 points 2 points  Months in reverse 2 points 0 points  Repeat phrase 4 points 10 points  Total Score 6 12    Immunizations Immunization History  Administered Date(s) Administered   Fluad Quad(high Dose 65+) 09/25/2020, 09/24/2021   Influenza-Unspecified 09/07/2016, 09/13/2018   Janssen (J&J) SARS-COV-2 Vaccination 03/12/2020   Moderna Covid-19 Vaccine Bivalent Booster 8yrs & up 09/24/2021   Pneumococcal Conjugate-13 11/11/2015   Pneumococcal Polysaccharide-23 12/11/2009, 12/26/2012   Td 12/11/2009   Zoster Recombinat (Shingrix) 09/23/2018, 11/29/2018    TDAP status: Due, Education has been provided regarding the importance of this  vaccine. Advised may receive this vaccine at local pharmacy or Health Dept. Aware to provide a copy of the vaccination record if obtained from local pharmacy or Health Dept. Verbalized acceptance and understanding.  Flu Vaccine status: Up to date  Pneumococcal vaccine status: Up to date  Covid-19 vaccine status: Completed vaccines  Qualifies for Shingles Vaccine? Yes   Zostavax completed No   Shingrix Completed?: Yes  Screening Tests Health Maintenance  Topic Date Due   TETANUS/TDAP  12/12/2019   COVID-19 Vaccine (2 - Janssen risk series) 09/24/2021   Pneumonia Vaccine 19+ Years old  Completed   INFLUENZA VACCINE  Completed   Zoster Vaccines- Shingrix  Completed   HPV VACCINES  Aged Out    Health Maintenance  Health Maintenance Due  Topic Date Due   TETANUS/TDAP  12/12/2019   COVID-19 Vaccine (2 - Janssen risk series) 09/24/2021    Colorectal cancer screening: No longer required.   Lung Cancer Screening: (Low Dose CT Chest recommended if Age 7-80 years, 30 pack-year currently smoking OR have quit w/in 15years.) does not qualify.   Additional Screening:  Vision Screening: Recommended annual ophthalmology exams for early detection of glaucoma and other disorders of the eye. Is the patient up to date with their annual eye exam?  Yes   Dental Screening: Recommended annual dental exams for proper oral hygiene     Plan:    1- Patient reports fatigue is slightly better since last visit.  He has stopped Chlorhalidone and started Vit D 2- Tetanus vaccine recommended - patient will get at pharmacy 3- Increase physical activity  I have  personally reviewed and noted the following in the patients chart:   Medical and social history Use of alcohol, tobacco or illicit drugs  Current medications and supplements including opioid prescriptions. Patient is not currently taking opioid prescriptions. Functional ability and status Nutritional status Physical activity Advanced  directives List of other physicians Hospitalizations, surgeries, and ER visits in previous 12 months Vitals Screenings to include cognitive, depression, and falls Referrals and appointments  In addition, I have reviewed and discussed with patient certain preventive protocols, quality metrics, and best practice recommendations. A written personalized care plan for preventive services as well as general preventive health recommendations were provided to patient.     Erie Noe, LPN   05/28/3793

## 2022-02-11 DIAGNOSIS — L209 Atopic dermatitis, unspecified: Secondary | ICD-10-CM | POA: Diagnosis not present

## 2022-02-16 ENCOUNTER — Other Ambulatory Visit: Payer: Self-pay

## 2022-02-16 ENCOUNTER — Encounter: Payer: Self-pay | Admitting: Family Medicine

## 2022-02-16 ENCOUNTER — Ambulatory Visit (INDEPENDENT_AMBULATORY_CARE_PROVIDER_SITE_OTHER): Payer: Medicare Other | Admitting: Family Medicine

## 2022-02-16 VITALS — BP 118/58 | HR 56 | Temp 98.4°F | Ht 67.0 in | Wt 196.0 lb

## 2022-02-16 DIAGNOSIS — I110 Hypertensive heart disease with heart failure: Secondary | ICD-10-CM | POA: Diagnosis not present

## 2022-02-16 DIAGNOSIS — E559 Vitamin D deficiency, unspecified: Secondary | ICD-10-CM | POA: Diagnosis not present

## 2022-02-16 DIAGNOSIS — F411 Generalized anxiety disorder: Secondary | ICD-10-CM | POA: Diagnosis not present

## 2022-02-16 DIAGNOSIS — E782 Mixed hyperlipidemia: Secondary | ICD-10-CM | POA: Diagnosis not present

## 2022-02-16 DIAGNOSIS — I5032 Chronic diastolic (congestive) heart failure: Secondary | ICD-10-CM | POA: Diagnosis not present

## 2022-02-16 DIAGNOSIS — R42 Dizziness and giddiness: Secondary | ICD-10-CM | POA: Insufficient documentation

## 2022-02-16 DIAGNOSIS — R2689 Other abnormalities of gait and mobility: Secondary | ICD-10-CM | POA: Insufficient documentation

## 2022-02-16 DIAGNOSIS — G4733 Obstructive sleep apnea (adult) (pediatric): Secondary | ICD-10-CM | POA: Diagnosis not present

## 2022-02-16 DIAGNOSIS — C61 Malignant neoplasm of prostate: Secondary | ICD-10-CM

## 2022-02-16 MED ORDER — VITAMIN D (ERGOCALCIFEROL) 1.25 MG (50000 UNIT) PO CAPS: 50000.0000 [IU] | ORAL_CAPSULE | ORAL | 3 refills | Status: DC

## 2022-02-16 MED ORDER — AMLODIPINE BESYLATE 5 MG PO TABS: 5.0000 mg | ORAL_TABLET | Freq: Two times a day (BID) | ORAL | 0 refills | Status: DC

## 2022-02-16 NOTE — Assessment & Plan Note (Signed)
The current regimen is effective;  continue present plan. ?

## 2022-02-16 NOTE — Assessment & Plan Note (Signed)
BP is very labile. Range is from sbp mid teens to 180.  ?Continue current medications.  ?Consider change to ARB from ACE.  ?GFR x 2.  ?

## 2022-02-16 NOTE — Assessment & Plan Note (Signed)
The current medical regimen is effective;  continue present plan and medications.  

## 2022-02-16 NOTE — Assessment & Plan Note (Signed)
Refer to physical therapy 

## 2022-02-16 NOTE — Progress Notes (Signed)
Subjective:  Patient ID: Marcus King, male    DOB: March 16, 1941  Age: 81 y.o. MRN: [id number]  Chief Complaint  Patient presents with   Hyperlipidemia   Hypertension   HPI: Hyperlipidemia: Patient is taking Pravastatin 20 mg 1 tablet daily.  Hypertension:  Bps 115--180/66-100. Checks 4-6 times per day. Always high in am.  CKD. GFR 44. Patient is taking Norvasc 5 mg daily, Coreg 25 mg BID, Lisinopril 20 mg twice daily. Previously tried on valsartan 3 yrs ago. He does not remember how it work. Tried hydralazine in the past. I recall it may have dropped his bp too low. Patient becomes dizzy when stands up. His wife is often afraid he will fall. Has never done physical therapy. He has had thorough work up that is negative.   Fatigue:  seen on 01/23/2022. Stopped chlorthalidone to see if this is the cause of fatigue. Vitamin D was low. Recommend vitamin D 50K weekly. Only has been taking it for a couple doses.  B12, folate, mma normal.   OSA using BIPAP. AT goal. Has had checked in the last   Edema: Patient is taking Lasix 40 mg daily.  Current Outpatient Medications on File Prior to Visit  Medication Sig Dispense Refill   apixaban (ELIQUIS) 5 MG TABS tablet Take 1 tablet (5 mg total) by mouth in the morning and at bedtime. 180 tablet 3   augmented betamethasone dipropionate (DIPROLENE) 0.05 % ointment Apply topically 2 (two) times daily. 100 g 1   busPIRone (BUSPAR) 30 MG tablet Take 1 tablet (30 mg total) by mouth 2 (two) times daily. (Patient taking differently: Take 15 mg by mouth in the morning and at bedtime.) 60 tablet 0   carvedilol (COREG) 25 MG tablet take 1 tablet (25 mg) by oral route 2 times per day with food 180 tablet 0   clobetasol cream (TEMOVATE) 0.05 % Apply topically 2 (two) times daily as needed.     darolutamide (NUBEQA) 300 MG tablet Take 1 tablet (300 mg total) by mouth daily. 3 days each week 120 tablet 3   Dupilumab (DUPIXENT [place]) Inject into the skin. Every  2-4 weeks     furosemide (LASIX) 20 MG tablet Take 2 tablets (40 mg total) by mouth daily. 180 tablet 0   hydrOXYzine (ATARAX/VISTARIL) 25 MG tablet Take 1 tablet (25 mg total) by mouth every 8 (eight) hours as needed. 270 tablet 1   Leuprolide Acetate, 3 Month, (LUPRON DEPOT, 71-MONTH, IM) Inject into the muscle.     lisinopril (ZESTRIL) 20 MG tablet Take 1 tablet (20 mg total) by mouth in the morning and at bedtime. 180 tablet 1   loratadine (CLARITIN) 10 MG tablet Take by mouth.     mupirocin ointment (BACTROBAN) 2 % Apply topically 3 (three) times daily.     PARoxetine (PAXIL) 40 MG tablet Take 1 tablet (40 mg total) by mouth daily. 90 tablet 1   pravastatin (PRAVACHOL) 20 MG tablet Take 1 tablet (20 mg total) by mouth daily. 90 tablet 1   triamcinolone cream (KENALOG) 0.1 % Apply topically 2 (two) times daily as needed.     triamcinolone ointment (KENALOG) 0.5 % Apply topically.     No current facility-administered medications on file prior to visit.   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, NYHA class 1 ([place]) 09/21/2018   Dyslipidemia 08/11/2017   Dyspnea on exertion 04/23/2020   Edema extremities 04/14/2018   Elevated glucose 10/17/2019   Fatigue 04/23/2020   Food poisoning    GAD (generalized anxiety disorder)    H/O deep venous thrombosis 04/14/2021   Headache    sinus   History of pulmonary embolism 06/13/2020   History of pulmonary embolus (PE) 06/13/2020   Hypertension    Hypertensive heart disease 08/05/2017   Hypoxia 10/17/2019   Inguinal lymphadenopathy 05/26/2020   LVH (left ventricular hypertrophy) due to hypertensive disease 08/11/2017   Malignant neoplasm of prostate ([place]) 09/09/2020   Mixed hyperlipidemia    Orthostatic hypotension    Orthostatic syncope 10/17/2019    OSA on CPAP    Prostate cancer ([place])    Pulmonary embolism ([place]) 04/2020   PVC's (premature ventricular contractions) 05/18/2018   Rectal mass 05/24/2020   S/P TKR (total knee replacement) using cement, 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   Solid nodule of lung greater than 8 mm in diameter 05/26/2020   Past Surgical History:  Procedure Laterality Date   CARDIAC CATHETERIZATION     CATARACT EXTRACTION W/ INTRAOCULAR LENS  IMPLANT, BILATERAL Bilateral    COLON SURGERY  2014   COLOSTOMY REVERSAL  2014   HERNIA REPAIR     x2   JOINT REPLACEMENT     PROSTATE SURGERY  2006   SEPTOPLASTY     TOTAL KNEE ARTHROPLASTY Left 09/28/2016   Procedure: TOTAL KNEE ARTHROPLASTY;  Surgeon: [name], MD;  Location: [facility];  Service: Orthopedics;  Laterality: Left;    Family History  Problem Relation Age of Onset   Stroke Mother    Parkinson's disease Mother    Lung disease Father    Stroke Father    Alcoholism Father    Leukemia Sister    Muscular dystrophy Son        Becker's   Alzheimer's disease Neg Hx    Social History   Socioeconomic History   Marital status: Married    Spouse name: Hoyle Sauer   Number of children: 2   Years of education: Not on file   Highest education level: High school graduate  Occupational History   Occupation: Engineer, petroleum    Comment: ICDs/Wound Care  Tobacco Use   Smoking status: Former    Years: 20.00    Types: Cigarettes    Quit date: 1979    Years since quitting: 44.2   Smokeless tobacco: Never  Vaping Use   Vaping Use: Never used  Substance and Sexual Activity   Alcohol use: No   Drug use: Never   Sexual activity: Not on file  Other Topics Concern   Not on file  Social History Narrative   LIves at home with his wife   Daughter lives in [facility], Son lives in [place]   Right handed   Drinks about 2 cups of caffeine daily   Social Determinants of Health   Financial Resource Strain: Low Risk    Difficulty of Paying Living Expenses: Not very hard   Food Insecurity: No Food Insecurity   Worried About Charity fundraiser in the Last Year: Never true   Arboriculturist in the Last Year: Never true  Transportation Needs: No Transportation Needs   Lack of Transportation (Medical): No   Lack of Transportation (Non-Medical): No  Physical Activity: Insufficiently Active   Days of Exercise per Week: 2 days  Minutes of Exercise per Session: 10 min  Stress: Not on file  Social Connections: Socially Integrated   Frequency of Communication with Friends and Family: More than three times a week   Frequency of Social Gatherings with Friends and Family: Twice a week   Attends Religious Services: More than 4 times per year   Active Member of [employer] or Organizations: Yes   Attends Music therapist: More than 4 times per year   Marital Status: Married    Review of Systems  Constitutional:  Positive for fatigue. Negative for appetite change and fever.  HENT:  Negative for congestion, ear pain, sinus pressure and sore throat.   Respiratory:  Negative for cough, chest tightness, shortness of breath and wheezing.   Cardiovascular:  Negative for chest pain and palpitations.  Gastrointestinal:  Negative for abdominal pain, constipation, diarrhea, nausea and vomiting.  Genitourinary:  Negative for dysuria and hematuria.  Musculoskeletal:  Negative for arthralgias and back pain.  Skin:  Negative for rash.  Neurological:  Positive for dizziness. Negative for weakness and headaches.       Balance off and has been.   Psychiatric/Behavioral:  Negative for dysphoric mood. The patient is not nervous/anxious.     Objective:  BP (!) 118/58 (BP Location: Right Arm, Patient Position: Sitting)    Pulse (!) 56    Temp 98.4 F (36.9 C) (Oral)    Ht '5\' 7"'$  (1.702 m)    Wt 196 lb (88.9 kg)    SpO2 97%    BMI 30.70 kg/m   BP/Weight 02/16/2022 02/04/2022 01/15/5426  Systolic BP 062 376 283  Diastolic BP 58 58 54  Wt. (Lbs) 196 199.4 194  BMI 30.7 28.61  30.38    Physical Exam Vitals reviewed.  Constitutional:      Appearance: Normal appearance. He is normal weight.  Neck:     Vascular: No carotid bruit.  Cardiovascular:     Rate and Rhythm: Normal rate and regular rhythm.     Heart sounds: Normal heart sounds.  Pulmonary:     Effort: Pulmonary effort is normal.     Breath sounds: Normal breath sounds.  Abdominal:     Palpations: Abdomen is soft.     Tenderness: There is no abdominal tenderness.  Neurological:     Mental Status: He is alert and oriented to person, place, and time.     Comments: Positive rhombergs. No pronator drift.  Psychiatric:        Mood and Affect: Mood normal.        Behavior: Behavior normal.    Diabetic Foot Exam - Simple   No data filed      Lab Results  Component Value Date   WBC 8.6 01/23/2022   HGB 13.8 01/23/2022   HCT 40.7 01/23/2022   PLT 307 01/23/2022   GLUCOSE 101 (H) 01/23/2022   CHOL 131 07/31/2021   TRIG 79 07/31/2021   HDL 51 07/31/2021   LDLCALC 64 07/31/2021   ALT 12 01/23/2022   AST 20 01/23/2022   NA 140 01/23/2022   K 4.0 01/23/2022   CL 99 01/23/2022   CREATININE 1.57 (H) 01/23/2022   BUN 30 (H) 01/23/2022   CO2 25 01/23/2022   TSH 1.260 01/23/2022   INR 1.03 01/09/2019   HGBA1C 5.6 12/01/2017      Assessment & Plan:   Problem List Items Addressed This Visit       Cardiovascular and Mediastinum   RESOLVED: Hypertensive heart disease  Relevant Medications   amLODipine (NORVASC) 5 MG tablet   Other Relevant Orders   CBC With Diff/Platelet   Comprehensive metabolic panel   Hypertensive heart disease with chronic diastolic congestive heart failure ([place]) - Primary    BP is very labile. Range is from sbp mid teens to 180.  Continue current medications.  Consider change to ARB from ACE.  GFR x 2.       Relevant Medications   amLODipine (NORVASC) 5 MG tablet   Other Relevant Orders   CBC With Diff/Platelet   Comprehensive metabolic panel      Respiratory   OSA treated with BiPAP (Chronic)    The current regimen is effective;  continue present plan.        Genitourinary   Malignant neoplasm of prostate ([place])     Other   Mixed hyperlipidemia    Well controlled.  No changes to medicines.  Continue to work on eating a healthy diet and exercise.  Labs drawn today.        Relevant Medications   amLODipine (NORVASC) 5 MG tablet   Other Relevant Orders   Lipid panel   GAD (generalized anxiety disorder)    The current medical regimen is effective;  continue present plan and medications.       Vitamin D deficiency    Continue vitamin D weekly. Check in approximately 3 months.       Imbalance    Refer to physical therapy      Relevant Orders   Ambulatory referral to Physical Therapy   Dizziness    Refer to physical therapy.       Relevant Orders   Ambulatory referral to Physical Therapy  .  Meds ordered this encounter  Medications   Vitamin D, Ergocalciferol, (DRISDOL) 1.25 MG (50000 UNIT) CAPS capsule    Sig: Take 1 capsule (50,000 Units total) by mouth every 7 (seven) days.    Dispense:  15 capsule    Refill:  3   amLODipine (NORVASC) 5 MG tablet    Sig: Take 1 tablet (5 mg total) by mouth in the morning and at bedtime.    Dispense:  180 tablet    Refill:  0    This prescription was filled on 02/10/2020. Any refills authorized will be placed on file.    Orders Placed This Encounter  Procedures   CBC With Diff/Platelet   Comprehensive metabolic panel   Lipid panel   Ambulatory referral to Physical Therapy     Follow-up: Return in about 3 months (around 05/19/2022) for chronic follow up.  An After Visit Summary was printed and given to the patient.     I,Lauren M Auman,acting as a scribe for Rochel Brome, MD.,have documented all relevant documentation on the behalf of Rochel Brome, MD,as directed by  Rochel Brome, MD while in the presence of Rochel Brome, MD.    Rochel Brome, MD [place] [phone]

## 2022-02-16 NOTE — Assessment & Plan Note (Signed)
Well controlled.  ?No changes to medicines.  ?Continue to work on eating a healthy diet and exercise.  ?Labs drawn today.  ?

## 2022-02-16 NOTE — Assessment & Plan Note (Signed)
Continue vitamin D weekly. Check in approximately 3 months.  ?

## 2022-02-17 ENCOUNTER — Other Ambulatory Visit: Payer: Self-pay

## 2022-02-17 LAB — CBC WITH DIFF/PLATELET
Basophils Absolute: 0 10*3/uL (ref 0.0–0.2)
Basos: 0 %
EOS (ABSOLUTE): 0.5 10*3/uL — ABNORMAL HIGH (ref 0.0–0.4)
Eos: 7 %
Hematocrit: 41.6 % (ref 37.5–51.0)
Hemoglobin: 14.2 g/dL (ref 13.0–17.7)
Immature Grans (Abs): 0 10*3/uL (ref 0.0–0.1)
Immature Granulocytes: 0 %
Lymphocytes Absolute: 1.7 10*3/uL (ref 0.7–3.1)
Lymphs: 23 %
MCH: 29.8 pg (ref 26.6–33.0)
MCHC: 34.1 g/dL (ref 31.5–35.7)
MCV: 87 fL (ref 79–97)
Monocytes Absolute: 0.6 10*3/uL (ref 0.1–0.9)
Monocytes: 8 %
Neutrophils Absolute: 4.7 10*3/uL (ref 1.4–7.0)
Neutrophils: 62 %
Platelets: 308 10*3/uL (ref 150–450)
RBC: 4.76 x10E6/uL (ref 4.14–5.80)
RDW: 13.2 % (ref 11.6–15.4)
WBC: 7.6 10*3/uL (ref 3.4–10.8)

## 2022-02-17 LAB — COMPREHENSIVE METABOLIC PANEL
ALT: 14 IU/L (ref 0–44)
AST: 18 IU/L (ref 0–40)
Albumin/Globulin Ratio: 2 (ref 1.2–2.2)
Albumin: 4.6 g/dL (ref 3.6–4.6)
Alkaline Phosphatase: 108 IU/L (ref 44–121)
BUN/Creatinine Ratio: 15 (ref 10–24)
BUN: 17 mg/dL (ref 8–27)
Bilirubin Total: 0.6 mg/dL (ref 0.0–1.2)
CO2: 23 mmol/L (ref 20–29)
Calcium: 9.6 mg/dL (ref 8.6–10.2)
Chloride: 105 mmol/L (ref 96–106)
Creatinine, Ser: 1.1 mg/dL (ref 0.76–1.27)
Globulin, Total: 2.3 g/dL (ref 1.5–4.5)
Glucose: 85 mg/dL (ref 70–99)
Potassium: 4.4 mmol/L (ref 3.5–5.2)
Sodium: 142 mmol/L (ref 134–144)
Total Protein: 6.9 g/dL (ref 6.0–8.5)
eGFR: 67 mL/min/{1.73_m2} (ref >59)

## 2022-02-17 LAB — LIPID PANEL
Chol/HDL Ratio: 2.7 ratio (ref 0.0–5.0)
Cholesterol, Total: 135 mg/dL (ref 100–199)
HDL: 50 mg/dL (ref >39)
LDL Chol Calc (NIH): 72 mg/dL (ref 0–99)
Triglycerides: 60 mg/dL (ref 0–149)
VLDL Cholesterol Cal: 13 mg/dL (ref 5–40)

## 2022-02-17 LAB — CARDIOVASCULAR RISK ASSESSMENT

## 2022-02-17 MED ORDER — OLMESARTAN MEDOXOMIL 40 MG PO TABS: 40.0000 mg | ORAL_TABLET | Freq: Every day | ORAL | 1 refills | Status: DC

## 2022-02-18 ENCOUNTER — Other Ambulatory Visit: Payer: Self-pay | Admitting: Family Medicine

## 2022-02-18 NOTE — Telephone Encounter (Signed)
Refill sent to pharmacy.   

## 2022-02-23 DIAGNOSIS — R2681 Unsteadiness on feet: Secondary | ICD-10-CM | POA: Diagnosis not present

## 2022-02-23 DIAGNOSIS — M6281 Muscle weakness (generalized): Secondary | ICD-10-CM | POA: Diagnosis not present

## 2022-02-23 DIAGNOSIS — R2689 Other abnormalities of gait and mobility: Secondary | ICD-10-CM | POA: Diagnosis not present

## 2022-02-26 DIAGNOSIS — R2689 Other abnormalities of gait and mobility: Secondary | ICD-10-CM | POA: Diagnosis not present

## 2022-02-26 DIAGNOSIS — R2681 Unsteadiness on feet: Secondary | ICD-10-CM | POA: Diagnosis not present

## 2022-02-26 DIAGNOSIS — M6281 Muscle weakness (generalized): Secondary | ICD-10-CM | POA: Diagnosis not present

## 2022-03-02 DIAGNOSIS — R2689 Other abnormalities of gait and mobility: Secondary | ICD-10-CM | POA: Diagnosis not present

## 2022-03-02 DIAGNOSIS — R2681 Unsteadiness on feet: Secondary | ICD-10-CM | POA: Diagnosis not present

## 2022-03-02 DIAGNOSIS — M6281 Muscle weakness (generalized): Secondary | ICD-10-CM | POA: Diagnosis not present

## 2022-03-05 DIAGNOSIS — R2689 Other abnormalities of gait and mobility: Secondary | ICD-10-CM | POA: Diagnosis not present

## 2022-03-05 DIAGNOSIS — M6281 Muscle weakness (generalized): Secondary | ICD-10-CM | POA: Diagnosis not present

## 2022-03-05 DIAGNOSIS — R2681 Unsteadiness on feet: Secondary | ICD-10-CM | POA: Diagnosis not present

## 2022-03-09 DIAGNOSIS — R2689 Other abnormalities of gait and mobility: Secondary | ICD-10-CM | POA: Diagnosis not present

## 2022-03-09 DIAGNOSIS — M6281 Muscle weakness (generalized): Secondary | ICD-10-CM | POA: Diagnosis not present

## 2022-03-09 DIAGNOSIS — R2681 Unsteadiness on feet: Secondary | ICD-10-CM | POA: Diagnosis not present

## 2022-03-11 DIAGNOSIS — L209 Atopic dermatitis, unspecified: Secondary | ICD-10-CM | POA: Diagnosis not present

## 2022-03-12 DIAGNOSIS — R2681 Unsteadiness on feet: Secondary | ICD-10-CM | POA: Diagnosis not present

## 2022-03-12 DIAGNOSIS — R2689 Other abnormalities of gait and mobility: Secondary | ICD-10-CM | POA: Diagnosis not present

## 2022-03-12 DIAGNOSIS — M6281 Muscle weakness (generalized): Secondary | ICD-10-CM | POA: Diagnosis not present

## 2022-03-13 ENCOUNTER — Telehealth: Payer: Self-pay

## 2022-03-13 NOTE — Chronic Care Management (AMB) (Signed)
Left patient a voicemail to schedule a telephone appointment with Arizona Constable. ? ?Jeannette How CMA ?Clinical Pharmacist Assistant ?[phone] ? ?

## 2022-03-16 DIAGNOSIS — R2681 Unsteadiness on feet: Secondary | ICD-10-CM | POA: Diagnosis not present

## 2022-03-16 DIAGNOSIS — R2689 Other abnormalities of gait and mobility: Secondary | ICD-10-CM | POA: Diagnosis not present

## 2022-03-16 DIAGNOSIS — M6281 Muscle weakness (generalized): Secondary | ICD-10-CM | POA: Diagnosis not present

## 2022-03-18 ENCOUNTER — Other Ambulatory Visit: Payer: Self-pay | Admitting: Family Medicine

## 2022-03-18 ENCOUNTER — Other Ambulatory Visit: Payer: Self-pay

## 2022-03-18 DIAGNOSIS — R2681 Unsteadiness on feet: Secondary | ICD-10-CM | POA: Diagnosis not present

## 2022-03-18 DIAGNOSIS — R2689 Other abnormalities of gait and mobility: Secondary | ICD-10-CM | POA: Diagnosis not present

## 2022-03-18 DIAGNOSIS — M6281 Muscle weakness (generalized): Secondary | ICD-10-CM | POA: Diagnosis not present

## 2022-03-18 MED ORDER — OLMESARTAN MEDOXOMIL 40 MG PO TABS: 40.0000 mg | ORAL_TABLET | Freq: Every day | ORAL | 1 refills | Status: DC

## 2022-03-23 DIAGNOSIS — R2689 Other abnormalities of gait and mobility: Secondary | ICD-10-CM | POA: Diagnosis not present

## 2022-03-23 DIAGNOSIS — M6281 Muscle weakness (generalized): Secondary | ICD-10-CM | POA: Diagnosis not present

## 2022-03-23 DIAGNOSIS — R2681 Unsteadiness on feet: Secondary | ICD-10-CM | POA: Diagnosis not present

## 2022-03-25 ENCOUNTER — Other Ambulatory Visit: Payer: Self-pay

## 2022-03-25 ENCOUNTER — Inpatient Hospital Stay: Payer: Medicare Other | Attending: Oncology

## 2022-03-25 ENCOUNTER — Other Ambulatory Visit: Payer: Self-pay | Admitting: Pharmacist

## 2022-03-25 ENCOUNTER — Ambulatory Visit: Payer: Medicare Other | Admitting: Oncology

## 2022-03-25 DIAGNOSIS — Z79899 Other long term (current) drug therapy: Secondary | ICD-10-CM | POA: Insufficient documentation

## 2022-03-25 DIAGNOSIS — C61 Malignant neoplasm of prostate: Secondary | ICD-10-CM | POA: Diagnosis not present

## 2022-03-25 DIAGNOSIS — C785 Secondary malignant neoplasm of large intestine and rectum: Secondary | ICD-10-CM | POA: Insufficient documentation

## 2022-03-25 DIAGNOSIS — C774 Secondary and unspecified malignant neoplasm of inguinal and lower limb lymph nodes: Secondary | ICD-10-CM | POA: Insufficient documentation

## 2022-03-25 DIAGNOSIS — Z5111 Encounter for antineoplastic chemotherapy: Secondary | ICD-10-CM | POA: Insufficient documentation

## 2022-03-25 LAB — BASIC METABOLIC PANEL
BUN: 27 — AB (ref 4–21)
CO2: 27 — AB (ref 13–22)
Chloride: 103 (ref 99–108)
Creatinine: 1.4 — AB (ref 0.6–1.3)
Glucose: 96
Potassium: 3.7 mEq/L (ref 3.5–5.1)
Sodium: 141 (ref 137–147)

## 2022-03-25 LAB — CBC AND DIFFERENTIAL
HCT: 42 (ref 41–53)
Hemoglobin: 13.8 (ref 13.5–17.5)
Neutrophils Absolute: 4.71
Platelets: 277 10*3/uL (ref 150–400)
WBC: 7.6

## 2022-03-25 LAB — PSA: Prostatic Specific Antigen: 0.04 ng/mL (ref 0.00–4.00)

## 2022-03-25 LAB — CBC: RBC: 4.73 (ref 3.87–5.11)

## 2022-03-25 LAB — COMPREHENSIVE METABOLIC PANEL
Albumin: 4.2 (ref 3.5–5.0)
Calcium: 9 (ref 8.7–10.7)

## 2022-03-25 LAB — HEPATIC FUNCTION PANEL
ALT: 17 U/L (ref 10–40)
AST: 24 (ref 14–40)
Alkaline Phosphatase: 84 (ref 25–125)
Bilirubin, Total: 0.6

## 2022-03-25 NOTE — Progress Notes (Signed)
? ?[facility]  ?[address] ?[place],  [place]  [address] ?[phone] ? ?Clinic Day:  03/26/2022 ? ?Referring physician: Rochel Brome, MD ? ? ?HISTORY OF PRESENT ILLNESS:  ?The patient is an 81 y.o. 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  He is currently on complete androgen deprivation therapy, which consists of Lupron/darolutamide.  As it pertains to his prostate cancer management, he denies having any side effects from his complete androgen blockade therapy.  He also denies having any new symptoms or findings that concern him for progression his metastatic prostate cancer. ? ?PHYSICAL EXAM:  ?Blood pressure (!) 112/57, pulse (!) 54, temperature 98.4 ?F (36.9 ?C), resp. rate 16, height '5\' 7"'$  (1.702 m), weight 195 lb 14.4 oz (88.9 kg), SpO2 96 %. ?Wt Readings from Last 3 Encounters:  ?03/26/22 196 lb 0.6 oz (88.9 kg)  ?03/26/22 195 lb 14.4 oz (88.9 kg)  ?02/16/22 196 lb (88.9 kg)  ? ?Body mass index is 30.68 kg/m?[name] ?Performance status (ECOG): 1 ?Physical Exam ?Constitutional:   ?   General: He is not in acute distress. ?   Appearance: Normal appearance. He is normal weight.  ?HENT:  ?   Head: Normocephalic and atraumatic.  ?Eyes:  ?   General: No scleral icterus. ?   Extraocular Movements: Extraocular movements intact.  ?   Conjunctiva/sclera: Conjunctivae normal.  ?   Pupils: Pupils are equal, round, and reactive to light.  ?Cardiovascular:  ?   Rate and Rhythm: Normal rate and regular rhythm.  ?   Pulses: Normal pulses.  ?   Heart sounds: Normal heart sounds. No murmur heard. ?  No friction rub. No gallop.  ?Pulmonary:  ?   Effort: Pulmonary effort is normal. No respiratory distress.  ?   Breath sounds: Normal breath sounds.  ?Abdominal:  ?   General: Bowel sounds are normal. There is no distension.  ?   Palpations: Abdomen is soft. There is no hepatomegaly, splenomegaly or mass.  ?   Tenderness:  There is no abdominal tenderness.  ?Musculoskeletal:     ?   General: Normal range of motion.  ?   Cervical back: Normal range of motion and neck supple.  ?   Right lower leg: No edema.  ?   Left lower leg: No edema.  ?Lymphadenopathy:  ?   Cervical: No cervical adenopathy.  ?Skin: ?   General: Skin is warm and dry.  ?Neurological:  ?   General: No focal deficit present.  ?   Mental Status: He is alert and oriented to person, place, and time. Mental status is at baseline.  ?Psychiatric:     ?   Mood and Affect: Mood normal.     ?   Behavior: Behavior normal.     ?   Thought Content: Thought content normal.     ?   Judgment: Judgment normal.  ? ? ?LABS:  ? ? ?  Latest Ref Rng & Units 03/25/2022  ? 12:00 AM 02/16/2022  ? 10:46 AM 01/23/2022  ? 12:32 PM  ?CBC  ?WBC  7.6      7.6   8.6    ?Hemoglobin 13.5 - 17.5 13.8      14.2   13.8    ?Hematocrit 41 - 53 42      41.6   40.7    ?Platelets 150 - 400 K/uL 277  308   307    ?  ? This result is from an external source.  ? ? ? ?  Latest Ref Rng & Units 03/25/2022  ? 12:00 AM 02/16/2022  ? 10:46 AM 01/23/2022  ? 12:32 PM  ?CMP  ?Glucose 70 - 99 mg/dL  85   101    ?BUN 4 - '21 27      17   30    '$ ?Creatinine 0.6 - 1.3 1.4      1.10   1.57    ?Sodium 137 - 147 141      142   140    ?Potassium 3.5 - 5.1 mEq/L 3.7      4.4   4.0    ?Chloride 99 - 108 103      105   99    ?CO2 13 - '22 27      23   25    '$ ?Calcium 8.7 - 10.7 9.0      9.6   9.6    ?Total Protein 6.0 - 8.5 g/dL  6.9   6.5    ?Total Bilirubin 0.0 - 1.2 mg/dL  0.6   0.3    ?Alkaline Phos 25 - 125 84      108   105    ?AST 14 - 40 '24      18   20    '$ ?ALT 10 - 40 U/L '17      14   12    '$ ?  ? This result is from an external source.  ? ? ? Latest Reference Range & Units 03/25/22 10:37  ?Prostatic Specific Antigen 0.00 - 4.00 ng/mL 0.04  ? ?ASSESSMENT & PLAN:  ?Assessment/Plan:  An 81 y.o. male with metastatic prostate cancer.  His PSA level of .04 is less than the undetectable level, which is considered to be < 0.1.  Based upon  this result, this gentleman is clinically considered to have a complete biochemical response to his complete androgen deprivation therapy.  Understandably, he is pleased with the results he has gotten thus far.  He will continue taking  Lupron/darolutamide to keep his disease under ideal control.  His current darolutamide dose is 300 mg every Monday, Wednesday, and Friday.   His Lupron injections are being given every 3 months.  As his disease remains under ideal control and he is clinically doing well, I will see him back in another 3 months for repeat clinical assessment.  The patient understands all the plans discussed today and is in agreement with them.   ? ?Kenshin Splawn Macarthur Critchley, MD ? ? ? ? ?  ?

## 2022-03-26 ENCOUNTER — Telehealth: Payer: Self-pay | Admitting: Oncology

## 2022-03-26 ENCOUNTER — Other Ambulatory Visit: Payer: Self-pay | Admitting: Oncology

## 2022-03-26 ENCOUNTER — Inpatient Hospital Stay: Payer: Medicare Other

## 2022-03-26 ENCOUNTER — Inpatient Hospital Stay (INDEPENDENT_AMBULATORY_CARE_PROVIDER_SITE_OTHER): Payer: Medicare Other | Admitting: Oncology

## 2022-03-26 VITALS — BP 112/57 | HR 54 | Temp 98.4°F | Resp 16 | Ht 67.0 in | Wt 195.9 lb

## 2022-03-26 VITALS — BP 109/66 | HR 59 | Temp 98.4°F | Resp 16 | Wt 196.0 lb

## 2022-03-26 DIAGNOSIS — C61 Malignant neoplasm of prostate: Secondary | ICD-10-CM | POA: Diagnosis not present

## 2022-03-26 DIAGNOSIS — Z5111 Encounter for antineoplastic chemotherapy: Secondary | ICD-10-CM | POA: Diagnosis not present

## 2022-03-26 DIAGNOSIS — C785 Secondary malignant neoplasm of large intestine and rectum: Secondary | ICD-10-CM | POA: Diagnosis not present

## 2022-03-26 DIAGNOSIS — C774 Secondary and unspecified malignant neoplasm of inguinal and lower limb lymph nodes: Secondary | ICD-10-CM | POA: Diagnosis not present

## 2022-03-26 DIAGNOSIS — Z79899 Other long term (current) drug therapy: Secondary | ICD-10-CM | POA: Diagnosis not present

## 2022-03-26 MED ORDER — LEUPROLIDE ACETATE (3 MONTH) 22.5 MG IM KIT
22.5000 mg | PACK | Freq: Once | INTRAMUSCULAR | Status: AC
  Administered 2022-03-26: 22.5 mg via INTRAMUSCULAR
  Filled 2022-03-26: qty 22.5

## 2022-03-26 NOTE — Patient Instructions (Signed)
Leuprolide injection ?What is this medication? ?LEUPROLIDE (loo PROE lide) is a man-made hormone. It is used to treat the symptoms of prostate cancer. This medicine may also be used to treat children with early onset of puberty. It may be used for other hormonal conditions. ?This medicine may be used for other purposes; ask your health care provider or pharmacist if you have questions. ?COMMON BRAND NAME(S): Lupron ?What should I tell my care team before I take this medication? ?They need to know if you have any of these conditions: ?diabetes ?heart disease or previous heart attack ?high blood pressure ?high cholesterol ?pain or difficulty passing urine ?spinal cord metastasis ?stroke ?tobacco smoker ?an unusual or allergic reaction to leuprolide, benzyl alcohol, other medicines, foods, dyes, or preservatives ?pregnant or trying to get pregnant ?breast-feeding ?How should I use this medication? ?This medicine is for injection under the skin or into a muscle. You will be taught how to prepare and give this medicine. Use exactly as directed. Take your medicine at regular intervals. Do not take your medicine more often than directed. ?It is important that you put your used needles and syringes in a special sharps container. Do not put them in a trash can. If you do not have a sharps container, call your pharmacist or healthcare provider to get one. ?A special MedGuide will be given to you by the pharmacist with each prescription and refill. Be sure to read this information carefully each time. ?Talk to your pediatrician regarding the use of this medicine in children. While this medicine may be prescribed for children as young as 8 years for selected conditions, precautions do apply. ?Overdosage: If you think you have taken too much of this medicine contact a poison control center or emergency room at once. ?NOTE: This medicine is only for you. Do not share this medicine with others. ?What if I miss a dose? ?If you miss  a dose, take it as soon as you can. If it is almost time for your next dose, take only that dose. Do not take double or extra doses. ?What may interact with this medication? ?Do not take this medicine with any of the following medications: ?chasteberry ?cisapride ?dronedarone ?pimozide ?thioridazine ?This medicine may also interact with the following medications: ?herbal or dietary supplements, like black cohosh or DHEA ?male hormones, like estrogens or progestins and birth control pills, patches, rings, or injections ?male hormones, like testosterone ?other medicines that prolong the QT interval (abnormal heart rhythm) ?This list may not describe all possible interactions. Give your health care provider a list of all the medicines, herbs, non-prescription drugs, or dietary supplements you use. Also tell them if you smoke, drink alcohol, or use illegal drugs. Some items may interact with your medicine. ?What should I watch for while using this medication? ?Visit your doctor or health care professional for regular checks on your progress. During the first week, your symptoms may get worse, but then will improve as you continue your treatment. You may get hot flashes, increased bone pain, increased difficulty passing urine, or an aggravation of nerve symptoms. Discuss these effects with your doctor or health care professional, some of them may improve with continued use of this medicine. ?Male patients may experience a menstrual cycle or spotting during the first 2 months of therapy with this medicine. If this continues, contact your doctor or health care professional. ?This medicine may increase blood sugar. Ask your healthcare provider if changes in diet or medicines are needed if you have  diabetes. ?What side effects may I notice from receiving this medication? ?Side effects that you should report to your doctor or health care professional as soon as possible: ?allergic reactions like skin rash, itching or  hives, swelling of the face, lips, or tongue ?breathing problems ?chest pain ?depression or memory disorders ?pain in your legs or groin ?pain at site where injected ?severe headache ?signs and symptoms of high blood sugar such as being more thirsty or hungry or having to urinate more than normal. You may also feel very tired or have blurry vision ?swelling of the feet and legs ?visual changes ?vomiting ?Side effects that usually do not require medical attention (report to your doctor or health care professional if they continue or are bothersome): ?breast swelling or tenderness ?decrease in sex drive or performance ?diarrhea ?hot flashes ?loss of appetite ?muscle, joint, or bone pains ?nausea ?redness or irritation at site where injected ?skin problems or acne ?This list may not describe all possible side effects. Call your doctor for medical advice about side effects. You may report side effects to FDA at 1-800-FDA-1088. ?Where should I keep my medication? ?Keep out of the reach of children. ?Store below 25 degrees C (77 degrees F). Do not freeze. Protect from light. Do not use if it is not clear or if there are particles present. Throw away any unused medicine after the expiration date. ?NOTE: This sheet is a summary. It may not cover all possible information. If you have questions about this medicine, talk to your doctor, pharmacist, or health care provider. ?? 2022 Elsevier/Gold Standard (2021-08-19 00:00:00) ? ?

## 2022-03-26 NOTE — Telephone Encounter (Signed)
Per 03/26/22 los next appt scheduled and confirmed with patient ?

## 2022-03-27 DIAGNOSIS — R2681 Unsteadiness on feet: Secondary | ICD-10-CM | POA: Diagnosis not present

## 2022-03-27 DIAGNOSIS — R2689 Other abnormalities of gait and mobility: Secondary | ICD-10-CM | POA: Diagnosis not present

## 2022-03-27 DIAGNOSIS — M6281 Muscle weakness (generalized): Secondary | ICD-10-CM | POA: Diagnosis not present

## 2022-03-30 DIAGNOSIS — M6281 Muscle weakness (generalized): Secondary | ICD-10-CM | POA: Diagnosis not present

## 2022-03-30 DIAGNOSIS — R2681 Unsteadiness on feet: Secondary | ICD-10-CM | POA: Diagnosis not present

## 2022-03-30 DIAGNOSIS — R2689 Other abnormalities of gait and mobility: Secondary | ICD-10-CM | POA: Diagnosis not present

## 2022-04-01 ENCOUNTER — Encounter: Payer: Self-pay | Admitting: Oncology

## 2022-04-01 NOTE — Telephone Encounter (Signed)
03/26/22 Next appt scheduled and confirmed with patient ?

## 2022-04-02 DIAGNOSIS — R2681 Unsteadiness on feet: Secondary | ICD-10-CM | POA: Diagnosis not present

## 2022-04-02 DIAGNOSIS — Z20822 Contact with and (suspected) exposure to covid-19: Secondary | ICD-10-CM | POA: Diagnosis not present

## 2022-04-02 DIAGNOSIS — R2689 Other abnormalities of gait and mobility: Secondary | ICD-10-CM | POA: Diagnosis not present

## 2022-04-02 DIAGNOSIS — M6281 Muscle weakness (generalized): Secondary | ICD-10-CM | POA: Diagnosis not present

## 2022-04-06 DIAGNOSIS — R2689 Other abnormalities of gait and mobility: Secondary | ICD-10-CM | POA: Diagnosis not present

## 2022-04-06 DIAGNOSIS — M6281 Muscle weakness (generalized): Secondary | ICD-10-CM | POA: Diagnosis not present

## 2022-04-06 DIAGNOSIS — R2681 Unsteadiness on feet: Secondary | ICD-10-CM | POA: Diagnosis not present

## 2022-04-09 DIAGNOSIS — M6281 Muscle weakness (generalized): Secondary | ICD-10-CM | POA: Diagnosis not present

## 2022-04-09 DIAGNOSIS — R2689 Other abnormalities of gait and mobility: Secondary | ICD-10-CM | POA: Diagnosis not present

## 2022-04-09 DIAGNOSIS — R2681 Unsteadiness on feet: Secondary | ICD-10-CM | POA: Diagnosis not present

## 2022-04-13 DIAGNOSIS — R2681 Unsteadiness on feet: Secondary | ICD-10-CM | POA: Diagnosis not present

## 2022-04-13 DIAGNOSIS — R2689 Other abnormalities of gait and mobility: Secondary | ICD-10-CM | POA: Diagnosis not present

## 2022-04-13 DIAGNOSIS — M6281 Muscle weakness (generalized): Secondary | ICD-10-CM | POA: Diagnosis not present

## 2022-04-16 DIAGNOSIS — R2681 Unsteadiness on feet: Secondary | ICD-10-CM | POA: Diagnosis not present

## 2022-04-16 DIAGNOSIS — M6281 Muscle weakness (generalized): Secondary | ICD-10-CM | POA: Diagnosis not present

## 2022-04-16 DIAGNOSIS — R2689 Other abnormalities of gait and mobility: Secondary | ICD-10-CM | POA: Diagnosis not present

## 2022-04-17 DIAGNOSIS — Z20822 Contact with and (suspected) exposure to covid-19: Secondary | ICD-10-CM | POA: Diagnosis not present

## 2022-04-20 DIAGNOSIS — M6281 Muscle weakness (generalized): Secondary | ICD-10-CM | POA: Diagnosis not present

## 2022-04-20 DIAGNOSIS — R2681 Unsteadiness on feet: Secondary | ICD-10-CM | POA: Diagnosis not present

## 2022-04-20 DIAGNOSIS — R2689 Other abnormalities of gait and mobility: Secondary | ICD-10-CM | POA: Diagnosis not present

## 2022-04-23 DIAGNOSIS — R2681 Unsteadiness on feet: Secondary | ICD-10-CM | POA: Diagnosis not present

## 2022-04-23 DIAGNOSIS — R2689 Other abnormalities of gait and mobility: Secondary | ICD-10-CM | POA: Diagnosis not present

## 2022-04-23 DIAGNOSIS — M6281 Muscle weakness (generalized): Secondary | ICD-10-CM | POA: Diagnosis not present

## 2022-04-27 ENCOUNTER — Ambulatory Visit: Payer: Medicare Other

## 2022-04-27 DIAGNOSIS — M6281 Muscle weakness (generalized): Secondary | ICD-10-CM | POA: Diagnosis not present

## 2022-04-27 DIAGNOSIS — L299 Pruritus, unspecified: Secondary | ICD-10-CM | POA: Diagnosis not present

## 2022-04-27 DIAGNOSIS — L209 Atopic dermatitis, unspecified: Secondary | ICD-10-CM | POA: Diagnosis not present

## 2022-04-27 DIAGNOSIS — R2681 Unsteadiness on feet: Secondary | ICD-10-CM | POA: Diagnosis not present

## 2022-04-27 DIAGNOSIS — E559 Vitamin D deficiency, unspecified: Secondary | ICD-10-CM | POA: Diagnosis not present

## 2022-04-27 DIAGNOSIS — R2689 Other abnormalities of gait and mobility: Secondary | ICD-10-CM | POA: Diagnosis not present

## 2022-04-27 DIAGNOSIS — L821 Other seborrheic keratosis: Secondary | ICD-10-CM | POA: Diagnosis not present

## 2022-04-28 LAB — VITAMIN D 25 HYDROXY (VIT D DEFICIENCY, FRACTURES): Vit D, 25-Hydroxy: 24.2 ng/mL — ABNORMAL LOW (ref 30.0–100.0)

## 2022-04-29 ENCOUNTER — Other Ambulatory Visit: Payer: Self-pay

## 2022-04-29 MED ORDER — VITAMIN D (ERGOCALCIFEROL) 1.25 MG (50000 UNIT) PO CAPS: 50000.0000 [IU] | ORAL_CAPSULE | ORAL | 3 refills | Status: DC

## 2022-04-30 DIAGNOSIS — M6281 Muscle weakness (generalized): Secondary | ICD-10-CM | POA: Diagnosis not present

## 2022-04-30 DIAGNOSIS — R2681 Unsteadiness on feet: Secondary | ICD-10-CM | POA: Diagnosis not present

## 2022-04-30 DIAGNOSIS — R2689 Other abnormalities of gait and mobility: Secondary | ICD-10-CM | POA: Diagnosis not present

## 2022-05-05 ENCOUNTER — Telehealth: Payer: Self-pay

## 2022-05-05 DIAGNOSIS — R2681 Unsteadiness on feet: Secondary | ICD-10-CM | POA: Diagnosis not present

## 2022-05-05 DIAGNOSIS — M6281 Muscle weakness (generalized): Secondary | ICD-10-CM | POA: Diagnosis not present

## 2022-05-05 DIAGNOSIS — R2689 Other abnormalities of gait and mobility: Secondary | ICD-10-CM | POA: Diagnosis not present

## 2022-05-05 NOTE — Chronic Care Management (AMB) (Signed)
05-05-2022: Patient was scheduled a telephone follow up visit with [facility] Constable CPP for 05-28-2022.  [facility] Pharmacist Assistant [phone]

## 2022-05-18 DIAGNOSIS — R2681 Unsteadiness on feet: Secondary | ICD-10-CM | POA: Diagnosis not present

## 2022-05-18 DIAGNOSIS — L209 Atopic dermatitis, unspecified: Secondary | ICD-10-CM | POA: Diagnosis not present

## 2022-05-18 DIAGNOSIS — R2689 Other abnormalities of gait and mobility: Secondary | ICD-10-CM | POA: Diagnosis not present

## 2022-05-18 DIAGNOSIS — M6281 Muscle weakness (generalized): Secondary | ICD-10-CM | POA: Diagnosis not present

## 2022-05-28 ENCOUNTER — Ambulatory Visit (INDEPENDENT_AMBULATORY_CARE_PROVIDER_SITE_OTHER): Payer: Medicare Other

## 2022-05-28 DIAGNOSIS — E559 Vitamin D deficiency, unspecified: Secondary | ICD-10-CM

## 2022-05-28 DIAGNOSIS — F411 Generalized anxiety disorder: Secondary | ICD-10-CM

## 2022-05-28 DIAGNOSIS — I110 Hypertensive heart disease with heart failure: Secondary | ICD-10-CM

## 2022-05-28 NOTE — Progress Notes (Signed)
Chronic Care Management Pharmacy Note  05/28/2022 Name:  Marcus King MRN:  [id number] DOB:  [birth year]  Summary: -Pleasant 81 year old male presents for f/u CCM visit. 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 Has 2 children, 1 in [place] and 1 in [facility]  Recommendations/Changes made from today's visit: -Patient taking Lisinopril 76m QD but Olmesartan 458mwas written. Patient denies taking Olmesartan but was filled recently. Will ask PCP which ACE/ARB they prefer -Patient on Vit D twice a week. Patient is moderately deficient (Between 20-30). Once a week Vit D increases risk of falls, patient on blood thinner and already unsteady on feet per notes. Will ask PCP about trying daily therapy instead. If not, patient needs refill of Vit D, will also send msg to PCP regarding this -Buspirone 3049mID is script but patient is taking 59m22m30mgPM. Will ask PCP if this is satisfactory and if so, could we get a new script -Patient's Furosemide written Furosemide 20mg22mke 2QD. Patient has been taking Furosemide 20mgQ42mly. Will ask PCP if it's ok to maintain therapy (Since he's been stable on this dose). If so, please send updated script to pharmacy -Amlodipine 5mg BI73ms script but patient is taking 5mg QD.36mll ask PCP which they prefer and to send updated script    Subjective: Marcus PeChanson Teems1 y.o. 41ar old male who is a primary patient of Cox, Kirsten, MD.  The CCM team was consulted for assistance with disease management and care coordination needs.    Engaged with patient by telephone for follow up visit in response to provider referral for pharmacy case management and/or care coordination services.   Consent to Services:  The patient was given the following information about Chronic Care Management services today, agreed to services, and gave verbal consent: 1. CCM service includes personalized support from designated clinical staff  supervised by the primary care provider, including individualized plan of care and coordination with other care providers 2. 24/7 contact phone numbers for assistance for urgent and routine care needs. 3. Service will only be billed when office clinical staff spend 20 minutes or more in a month to coordinate care. 4. Only one practitioner may furnish and bill the service in a calendar month. 5.The patient may stop CCM services at any time (effective at the end of the month) by phone call to the office staff. 6. The patient will be responsible for cost sharing (co-pay) of up to 20% of the service fee (after annual deductible is met). Patient agreed to services and consent obtained.  Patient Care Team: Cox, KirRochel BromePCP - General (Family Medicine) Lewis, DMarice PotterPCP - Hematology/Oncology (Oncology) WilliamsJolene SchimkeReferring Physician (Dermatology) Webb, MaEdrick OhConsulting Physician (Nephrology) Folk, ThFlossie Buffy Referring Physician (Cardiology) Clessie Karras,Lane HackerhaSol[facility] Hospital [facility] - Ed[facility]burgcist)     Objective:  Lab Results  Component Value Date   CREATININE 1.4 (A) 03/25/2022   BUN 27 (A) 03/25/2022   EGFR 67 02/16/2022   GFRNONAA 67 01/07/2021   GFRAA 77 01/07/2021   NA 141 03/25/2022   K 3.7 03/25/2022   CALCIUM 9.0 03/25/2022   CO2 27 (A) 03/25/2022   GLUCOSE 85 02/16/2022    Lab Results  Component Value Date/Time   HGBA1C 5.6 12/01/2017 12:49 PM    Last diabetic Eye exam: No results found for: "HMDIABEYEEXA"  Last diabetic Foot exam: No results found for: "HMDIABFOOTEX"  Lab Results  Component Value Date   CHOL 135 02/16/2022   HDL 50 02/16/2022   LDLCALC 72 02/16/2022   TRIG 60 02/16/2022   CHOLHDL 2.7 02/16/2022       Latest Ref Rng & Units 03/25/2022   12:00 AM 02/16/2022   10:46 AM 01/23/2022   12:32 PM  Hepatic Function  Total Protein 6.0 - 8.5 g/dL  6.9  6.5   Albumin 3.5 - 5.0 4.2     4.6  4.2   AST 14 - 40 '24     18  20   ' ALT 10 - 40  U/L '17     14  12   ' Alk Phosphatase 25 - 125 84     108  105   Total Bilirubin 0.0 - 1.2 mg/dL  0.6  0.3      This result is from an external source.    Lab Results  Component Value Date/Time   TSH 1.260 01/23/2022 12:32 PM   TSH 1.300 01/07/2021 04:49 PM       Latest Ref Rng & Units 03/25/2022   12:00 AM 02/16/2022   10:46 AM 01/23/2022   12:32 PM  CBC  WBC  7.6     7.6  8.6   Hemoglobin 13.5 - 17.5 13.8     14.2  13.8   Hematocrit 41 - 53 42     41.6  40.7   Platelets 150 - 400 K/uL 277     308  307      This result is from an external source.    Lab Results  Component Value Date/Time   VD25OH 24.2 (L) 04/27/2022 11:24 AM   VD25OH 23.8 (L) 01/23/2022 12:32 PM    Clinical ASCVD: No  The ASCVD Risk score (Arnett DK, et al., 2019) failed to calculate for the following reasons:   The 2019 ASCVD risk score is only valid for ages 53 to 3       02/04/2022   10:38 AM 09/24/2021    9:55 AM 11/27/2020    2:09 PM  Depression screen PHQ 2/9  Decreased Interest 0 0 0  Down, Depressed, Hopeless 0 0 0  PHQ - 2 Score 0 0 0     Other: (CHADS2VASc if Afib, MMRC or CAT for COPD, ACT, DEXA)  Social History   Tobacco Use  Smoking Status Former   Years: 20.00   Types: Cigarettes   Quit date: 1979   Years since quitting: 44.4  Smokeless Tobacco Never   BP Readings from Last 3 Encounters:  03/26/22 109/66  03/26/22 (!) 112/57  02/16/22 (!) 118/58   Pulse Readings from Last 3 Encounters:  03/26/22 (!) 59  03/26/22 (!) 54  02/16/22 (!) 56   Wt Readings from Last 3 Encounters:  03/26/22 196 lb 0.6 oz (88.9 kg)  03/26/22 195 lb 14.4 oz (88.9 kg)  02/16/22 196 lb (88.9 kg)   BMI Readings from Last 3 Encounters:  03/26/22 30.70 kg/m  03/26/22 30.68 kg/m  02/16/22 30.70 kg/m    Assessment/Interventions: Review of patient past medical history, allergies, medications, health status, including review of consultants reports, laboratory and other test data, was performed  as part of comprehensive evaluation and provision of chronic care management services.   SDOH:  (Social Determinants of Health) assessments and interventions performed: Yes SDOH Interventions    Flowsheet Row Most Recent Value  SDOH Interventions   Financial Strain Interventions Other (Comment)  [Unable to apply PAP]  Physical  Activity Interventions Patient Refused      SDOH Screenings   Alcohol Screen: Low Risk  (02/04/2022)   Alcohol Screen    Last Alcohol Screening Score (AUDIT): 0  Depression (PHQ2-9): Low Risk  (02/04/2022)   Depression (PHQ2-9)    PHQ-2 Score: 0  Financial Resource Strain: Medium Risk (05/28/2022)   Overall Financial Resource Strain (CARDIA)    Difficulty of Paying Living Expenses: Somewhat hard  Food Insecurity: No Food Insecurity (02/04/2022)   Hunger Vital Sign    Worried About Running Out of Food in the Last Year: Never true    Ran Out of Food in the Last Year: Never true  Housing: Low Risk  (05/08/2020)   Housing    Last Housing Risk Score: 0  Physical Activity: Insufficiently Active (05/28/2022)   Exercise Vital Sign    Days of Exercise per Week: 2 days    Minutes of Exercise per Session: 10 min  Social Connections: Socially Integrated (02/04/2022)   Social Connection and Isolation Panel [NHANES]    Frequency of Communication with Friends and Family: More than three times a week    Frequency of Social Gatherings with Friends and Family: Twice a week    Attends Religious Services: More than 4 times per year    Active Member of [employer] or Organizations: Yes    Attends Archivist Meetings: More than 4 times per year    Marital Status: Married  Stress: Not on file  Tobacco Use: Medium Risk (02/16/2022)   Patient History    Smoking Tobacco Use: Former    Smokeless Tobacco Use: Never    Passive Exposure: Not on file  Transportation Needs: No Transportation Needs (02/04/2022)   PRAPARE - Hydrologist (Medical): No     Lack of Transportation (Non-Medical): No    CCM Care Plan  Allergies  Allergen Reactions   Beef-Derived Products Diarrhea   Clonazepam Other (See Comments)    High doses cause drunk feeling (>1 mg)   Monosodium Glutamate Other (See Comments)    MSG (soups and oriental foods)   Pegademase Bovine Diarrhea    Medications Reviewed Today     Reviewed by Lane Hacker, [facility] (Pharmacist) on 05/28/22 at 1032  Med List Status: <None>   Medication Order Taking? Sig Documenting Provider Last Dose Status Informant  amLODipine (NORVASC) 5 MG tablet [id number] Yes Take 1 tablet (5 mg total) by mouth in the morning and at bedtime. Cox, Kirsten, MD Taking Active   apixaban (ELIQUIS) 5 MG TABS tablet [id number]  Take 1 tablet (5 mg total) by mouth in the morning and at bedtime. [place] Scrape A, NP  Active   augmented betamethasone dipropionate (DIPROLENE) 0.05 % ointment [id number]  Apply topically 2 (two) times daily. Cox, Kirsten, MD  Active   busPIRone (BUSPAR) 30 MG tablet [id number] Yes Take 1 tablet (30 mg total) by mouth 2 (two) times daily.  Patient taking differently: Take 15 mg by mouth in the morning and at bedtime.   Marge Duncans, PA-C Taking Active   carvedilol (COREG) 25 MG tablet [id number] Yes take 1 tablet (25 mg) by oral route 2 times per day with food Cox, Kirsten, MD Taking Active   clobetasol cream (TEMOVATE) 0.05 % [id number]  Apply topically 2 (two) times daily as needed. [provider]  Active   darolutamide (NUBEQA) 300 MG tablet [id number]  Take 1 tablet (300 mg total) by mouth daily. 3 days each week Dayton Scrape A,  NP  Active   Dupilumab (DUPIXENT [place]) [id number]  Inject into the skin. Every 2-4 weeks [provider]  Active   furosemide (LASIX) 20 MG tablet [id number] Yes Take 2 tablets (40 mg total) by mouth daily. Cox, Kirsten, MD Taking Active   hydrOXYzine (ATARAX/VISTARIL) 25 MG tablet [id number] Yes Take 1 tablet (25 mg total) by mouth every 8 (eight)  hours as needed. CoxElnita Maxwell, MD Taking Active   Leuprolide Acetate, 3 Month, Okey Dupre, IM) [id number]  Inject into the muscle. [provider]  Active   loratadine (CLARITIN) 10 MG tablet [id number]  Take by mouth. [provider]  Active   mupirocin ointment (BACTROBAN) 2 % [id number]  Apply topically 3 (three) times daily. [provider]  Active   olmesartan (BENICAR) 40 MG tablet [id number]  Take 1 tablet (40 mg total) by mouth at bedtime. Cox, Kirsten, MD  Active   PARoxetine (PAXIL) 40 MG tablet [id number]  Take 1 tablet (40 mg total) by mouth daily. Cox, Kirsten, MD  Active   pravastatin (PRAVACHOL) 20 MG tablet [id number] Yes Take 1 tablet (20 mg total) by mouth daily. Cox, Kirsten, MD Taking Active   triamcinolone cream (KENALOG) 0.1 % [id number]  Apply topically 2 (two) times daily as needed. [provider]  Active   triamcinolone ointment (KENALOG) 0.5 % [id number]  Apply topically. [provider]  Active   Vitamin D, Ergocalciferol, (DRISDOL) 1.25 MG (50000 UNIT) CAPS capsule [id number]  Take 1 capsule (50,000 Units total) by mouth 2 (two) times a week. Rochel Brome, MD  Active             Patient Active Problem List   Diagnosis Date Noted   Imbalance 02/16/2022   Dizziness 02/16/2022   Paresthesia 01/23/2022   Memory loss 01/23/2022   Other fatigue 01/23/2022   Vitamin D deficiency 01/23/2022   Close exposure to COVID-19 virus 11/25/2021   Dermatitis 11/25/2021   Hypertensive heart disease with chronic diastolic congestive heart failure ([place]) 11/25/2021   BMI 28.0-28.9,adult 10/05/2021   Mixed hyperlipidemia    GAD (generalized anxiety disorder)    Cervical disc disorder with myelopathy, cervicothoracic region    Malignant neoplasm of prostate ([place]) 09/09/2020   Solid nodule of lung greater than 8 mm in diameter 05/26/2020   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   Congestive heart failure with LV diastolic  dysfunction, NYHA class 1 ([place]) 09/21/2018   PVC's (premature ventricular contractions) 05/18/2018   LVH (left ventricular hypertrophy) due to hypertensive disease 08/11/2017   OSA treated with BiPAP    S/P TKR (total knee replacement) using cement, 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    Immunization History  Administered Date(s) Administered   Fluad Quad(high Dose 65+) 09/25/2020, 09/24/2021   Influenza-Unspecified 09/07/2016, 09/13/2018   Janssen (J&J) SARS-COV-2 Vaccination 03/12/2020   Moderna Covid-19 Vaccine Bivalent Booster 46yr & up 09/24/2021   Pneumococcal Conjugate-13 11/11/2015   Pneumococcal Polysaccharide-23 12/11/2009, 12/26/2012   Td 12/11/2009   Tdap 02/16/2022   Zoster Recombinat (Shingrix) 09/23/2018, 11/29/2018    Conditions to be addressed/monitored:  Hypertension and Hyperlipidemia  [place] Updates made by KLane Hacker RPH since 05/28/2022 12:00 AM     Problem: Cholesterol, Cancer, HTN   Priority: High  Onset Date: 05/28/2022     Long-Range Goal: Disease State Management   Start Date: 05/28/2022  Expected End Date: 05/28/2023  This Visit's Progress: On track  Priority: High  Note:   Current Barriers:  Does not contact provider office for questions/concerns  Pharmacist Clinical Goal(s):  Patient will contact provider office for questions/concerns as evidenced notation of same in electronic health record through collaboration with PharmD and provider.   Interventions: 1:1 collaboration with Rochel Brome, MD regarding development and update of comprehensive plan of care as evidenced by provider attestation and co-signature Inter-disciplinary care team collaboration (see longitudinal plan of care) Comprehensive medication review performed; medication list updated in electronic medical record  Hypertension (BP goal <130/80) BP Readings from Last 3 Encounters:  03/26/22 109/66  03/26/22 (!) 112/57  02/16/22 (!) 118/58   -Controlled -Current treatment: Amlodipine 77m BID (Patient taking QD) Query Appropriate Carvedilol 242mBID Appropriate, Effective, Safe, Accessible Furosemide 2057make 2QD (Patient taking 1QD) Query Appropriate Hydroxyzine 90m8mD PRN (Patient taking BID always) Query Appropriate Olmesartan 40mg56m medslist but patient not taking) Query Appropriate, Lisinopril 20mg 18m on medslist but patient taking) Query Appropriate,  -Medications previously tried: N/A -Current home readings:  June 2023: BP ranges from 110-160's. Most likely because patient taking Hydroxyzine BID. It is severely lowering him, then leaving the body (Half life is 3-7 hours) so his BP increases, then he takes it again and his BP drops -Current dietary habits: "Tries to eat healthy" -Current exercise habits: N/A -Denies hypotensive/hypertensive symptoms -Educated on BP goals and benefits of medications for prevention of heart attack, stroke and kidney damage; -Counseled to monitor BP at home daily, document, and provide log at future appointments June 2023: Patient non-compliant on meds. Furosemide 20mg t11m2QD (Patient taking 1QD), taking Lisinopril but Olmesartan is written  Hyperlipidemia: (LDL goal < 100) Lab Results  Component Value Date   CHOL 135 02/16/2022   CHOL 131 07/31/2021   CHOL 178 03/07/2021   Lab Results  Component Value Date   HDL 50 02/16/2022   HDL 51 07/31/2021   HDL 54 03/07/2021   Lab Results  Component Value Date   LDLCALC 72 02/16/2022   LDLCALC 64 07/31/2021   LDLCALC 106 (H) 03/07/2021   Lab Results  Component Value Date   TRIG 60 02/16/2022   TRIG 79 07/31/2021   TRIG 102 03/07/2021   Lab Results  Component Value Date   CHOLHDL 2.7 02/16/2022   CHOLHDL 2.6 07/31/2021   CHOLHDL 3.3 03/07/2021  No results found for: "LDLDIRECT" -Controlled -Current treatment: Pravastatin 20mg Ap54mriate, Effective, Safe, Accessible -Medications previously tried: N/A  -Current  dietary patterns: "Tries to eat healthy" -Current exercise habits: N/A -Educated on Cholesterol goals;  -Recommended to continue current medication  Depression/Anxiety (Goal: decrease symptoms) -Controlled -Current treatment: Buspirone 30mg BID51mtient taking 15mgAM-3078m) Appropriate, Effective, Safe, Accessible Paroxetine 40mg Appro64mte, Effective, Safe, Accessible Hydroxyzine 90mg TID PR3matient taking BID Always) Appropriate, Effective, Safe, Accessible -Medications previously tried/failed: N/A -PHQ9:     02/04/2022   10:38 AM 09/24/2021    9:55 AM 11/27/2020    2:09 PM  Depression screen PHQ 2/9  Decreased Interest 0 0 0  Down, Depressed, Hopeless 0 0 0  PHQ - 2 Score 0 0 0  -GAD7:      No data to display         -Educated on Benefits of medication for symptom control June 2023: Will ask PCP about updating script   Vit D Deficiency (Goal: 30-100) Last vitamin D Lab Results  Component Value Date   VD25OH 24.2 (L) 04/27/2022  -Not ideally controlled -Current treatment  Ergocalciferol 1.90mg 2x/week64mdications previously tried: N/A  June 2023: Patient already unsteady  on his feet, high dose Vit D increases fall risk. Will ask PCP about potentially using daily VitD since he's not severely low   Prostate Cancer -Controlled -Current treatment  Darolutamide 317m Appropriate, Effective, Safe, Accessible Duilumab Appropriate, Effective, Safe, Accessible Leuprolide Appropriate, Effective, Safe, Accessible -Medications previously tried:   -Recommended to continue current medication  Patient Goals/Self-Care Activities Patient will:  - take medications as prescribed as evidenced by patient report and record review  Follow Up Plan: The patient has been provided with contact information for the care management team and has been advised to call with any health related questions or concerns.   CPP F/U December 2023  [place] Constable PSherian ReinD. -  [phone]        Medication Assistance: None required.  Patient affirms current coverage meets needs.  Compliance/Adherence/Medication fill history: Care Gaps: N/A  Star-Rating Drugs: N/a  Patient's preferred pharmacy is:  [id number]: [phone]: [address] [id number]: [phone]: [phone] Uses pill box? No -   Pt endorses 100% compliance  We discussed: Current pharmacy is preferred with insurance plan and patient is satisfied with pharmacy services Patient decided to: Continue current medication management strategy  Care Plan and Follow Up Patient Decision:  Patient agrees to Care Plan and Follow-up.  Plan: The patient has been provided with contact information for the care management team and has been advised to call with any health related questions or concerns.   CPP F/U December 2023  [place] Constable PSherian ReinD. -- [phone]

## 2022-05-28 NOTE — Patient Instructions (Signed)
Visit Information   Goals Addressed   None    Patient Care Plan: CCM Pharmacy Care Plan     Problem Identified: Cholesterol, Cancer, HTN   Priority: High  Onset Date: 05/28/2022     Long-Range Goal: Disease State Management   Start Date: 05/28/2022  Expected End Date: 05/28/2023  This Visit's Progress: On track  Priority: High  Note:   Current Barriers:  Does not contact provider office for questions/concerns  Pharmacist Clinical Goal(s):  Patient will contact provider office for questions/concerns as evidenced notation of same in electronic health record through collaboration with PharmD and provider.   Interventions: 1:1 collaboration with Rochel Brome, MD regarding development and update of comprehensive plan of care as evidenced by provider attestation and co-signature Inter-disciplinary care team collaboration (see longitudinal plan of care) Comprehensive medication review performed; medication list updated in electronic medical record  Hypertension (BP goal <130/80) BP Readings from Last 3 Encounters:  03/26/22 109/66  03/26/22 (!) 112/57  02/16/22 (!) 118/58  -Controlled -Current treatment: Amlodipine '5mg'$  BID (Patient taking QD) Query Appropriate Carvedilol '25mg'$  BID Appropriate, Effective, Safe, Accessible Furosemide '20mg'$  take 2QD (Patient taking 1QD) Query Appropriate Hydroxyzine '25mg'$  TID PRN (Patient taking BID always) Query Appropriate Olmesartan '40mg'$  (On medslist but patient not taking) Query Appropriate, Lisinopril '20mg'$  (Not on medslist but patient taking) Query Appropriate,  -Medications previously tried: N/A -Current home readings:  June 2023: BP ranges from 110-160's. Most likely because patient taking Hydroxyzine BID. It is severely lowering him, then leaving the body (Half life is 3-7 hours) so his BP increases, then he takes it again and his BP drops -Current dietary habits: "Tries to eat healthy" -Current exercise habits: N/A -Denies  hypotensive/hypertensive symptoms -Educated on BP goals and benefits of medications for prevention of heart attack, stroke and kidney damage; -Counseled to monitor BP at home daily, document, and provide log at future appointments June 2023: Patient non-compliant on meds. Furosemide '20mg'$  take 2QD (Patient taking 1QD), taking Lisinopril but Olmesartan is written  Hyperlipidemia: (LDL goal < 100) Lab Results  Component Value Date   CHOL 135 02/16/2022   CHOL 131 07/31/2021   CHOL 178 03/07/2021   Lab Results  Component Value Date   HDL 50 02/16/2022   HDL 51 07/31/2021   HDL 54 03/07/2021   Lab Results  Component Value Date   LDLCALC 72 02/16/2022   LDLCALC 64 07/31/2021   LDLCALC 106 (H) 03/07/2021   Lab Results  Component Value Date   TRIG 60 02/16/2022   TRIG 79 07/31/2021   TRIG 102 03/07/2021   Lab Results  Component Value Date   CHOLHDL 2.7 02/16/2022   CHOLHDL 2.6 07/31/2021   CHOLHDL 3.3 03/07/2021  No results found for: "LDLDIRECT" -Controlled -Current treatment: Pravastatin '20mg'$  Appropriate, Effective, Safe, Accessible -Medications previously tried: N/A  -Current dietary patterns: "Tries to eat healthy" -Current exercise habits: N/A -Educated on Cholesterol goals;  -Recommended to continue current medication  Depression/Anxiety (Goal: decrease symptoms) -Controlled -Current treatment: Buspirone '30mg'$  BID (Patient taking '15mg'$ AM-'30mg'$ PM) Appropriate, Effective, Safe, Accessible Paroxetine '40mg'$  Appropriate, Effective, Safe, Accessible Hydroxyzine '25mg'$  TID PRN (Patient taking BID Always) Appropriate, Effective, Safe, Accessible -Medications previously tried/failed: N/A -PHQ9:     02/04/2022   10:38 AM 09/24/2021    9:55 AM 11/27/2020    2:09 PM  Depression screen PHQ 2/9  Decreased Interest 0 0 0  Down, Depressed, Hopeless 0 0 0  PHQ - 2 Score 0 0 0  -GAD7:  No data to display         -Educated on Benefits of medication for symptom  control June 2023: Will ask PCP about updating script   Vit D Deficiency (Goal: 30-100) Last vitamin D Lab Results  Component Value Date   VD25OH 24.2 (L) 04/27/2022  -Not ideally controlled -Current treatment  Ergocalciferol 1.'25mg'$  2x/week -Medications previously tried: N/A  June 2023: Patient already unsteady on his feet, high dose Vit D increases fall risk. Will ask PCP about potentially using daily VitD since he's not severely low   Prostate Cancer -Controlled -Current treatment  Darolutamide '300mg'$  Appropriate, Effective, Safe, Accessible Duilumab Appropriate, Effective, Safe, Accessible Leuprolide Appropriate, Effective, Safe, Accessible -Medications previously tried:   -Recommended to continue current medication  Patient Goals/Self-Care Activities Patient will:  - take medications as prescribed as evidenced by patient report and record review  Follow Up Plan: The patient has been provided with contact information for the care management team and has been advised to call with any health related questions or concerns.   CPP F/U December 2023  [place] Constable, Sherian Rein.D. - [phone]       Mr. Alleva was given information about Chronic Care Management services today including:  CCM service includes personalized support from designated clinical staff supervised by his physician, including individualized plan of care and coordination with other care providers 24/7 contact phone numbers for assistance for urgent and routine care needs. Standard insurance, coinsurance, copays and deductibles apply for chronic care management only during months in which we provide at least 20 minutes of these services. Most insurances cover these services at 100%, however patients may be responsible for any copay, coinsurance and/or deductible if applicable. This service may help you avoid the need for more expensive face-to-face services. Only one practitioner may furnish and bill the service  in a calendar month. The patient may stop CCM services at any time (effective at the end of the month) by phone call to the office staff.  Patient agreed to services and verbal consent obtained.   The patient verbalized understanding of instructions, educational materials, and care plan provided today and DECLINED offer to receive copy of patient instructions, educational materials, and care plan.  The pharmacy team will reach out to the patient again over the next 60 days.   Lane Hacker, [place]

## 2022-05-31 ENCOUNTER — Other Ambulatory Visit: Payer: Self-pay | Admitting: Family Medicine

## 2022-05-31 MED ORDER — VITAMIN D (ERGOCALCIFEROL) 1.25 MG (50000 UNIT) PO CAPS: 50000.0000 [IU] | ORAL_CAPSULE | ORAL | 1 refills | Status: DC

## 2022-06-02 ENCOUNTER — Telehealth: Payer: Self-pay

## 2022-06-02 NOTE — Telephone Encounter (Addendum)
Connection lost.  ----- Message from Rochel Brome, MD sent at 05/31/2022 11:22 PM EDT ----- Regarding: BP meds. Our pharmacist believes hydroxyzine may be causing his bp to drop and then rebound back up which may be why his bps are so labile.  Please ask him to try to stop the hydroxyzine.  Recommend patient follow up in 2 weeks with me about bp medicines.

## 2022-06-02 NOTE — Telephone Encounter (Signed)
-----   Message from Rochel Brome, MD sent at 05/31/2022 11:22 PM EDT ----- Regarding: BP meds. Our pharmacist believes hydroxyzine may be causing his bp to drop and then rebound back up which may be why his bps are so labile.  Please ask him to try to stop the hydroxyzine.  Recommend patient follow up in 2 weeks with me about bp medicines.

## 2022-06-02 NOTE — Telephone Encounter (Signed)
Left detailed message for patient to stop hydroyzine and recheck in the office as scheduled.

## 2022-06-08 ENCOUNTER — Ambulatory Visit: Payer: Medicare Other | Admitting: Family Medicine

## 2022-06-12 DIAGNOSIS — F32A Depression, unspecified: Secondary | ICD-10-CM

## 2022-06-12 DIAGNOSIS — I10 Essential (primary) hypertension: Secondary | ICD-10-CM | POA: Diagnosis not present

## 2022-06-12 DIAGNOSIS — E785 Hyperlipidemia, unspecified: Secondary | ICD-10-CM | POA: Diagnosis not present

## 2022-06-17 DIAGNOSIS — L209 Atopic dermatitis, unspecified: Secondary | ICD-10-CM | POA: Diagnosis not present

## 2022-06-19 ENCOUNTER — Other Ambulatory Visit: Payer: Self-pay | Admitting: Family Medicine

## 2022-06-23 ENCOUNTER — Other Ambulatory Visit: Payer: Self-pay | Admitting: Pharmacist

## 2022-06-24 ENCOUNTER — Inpatient Hospital Stay: Payer: Medicare Other | Attending: Oncology

## 2022-06-24 DIAGNOSIS — C774 Secondary and unspecified malignant neoplasm of inguinal and lower limb lymph nodes: Secondary | ICD-10-CM | POA: Insufficient documentation

## 2022-06-24 DIAGNOSIS — Z5111 Encounter for antineoplastic chemotherapy: Secondary | ICD-10-CM | POA: Diagnosis not present

## 2022-06-24 DIAGNOSIS — C61 Malignant neoplasm of prostate: Secondary | ICD-10-CM | POA: Insufficient documentation

## 2022-06-24 LAB — PSA: Prostatic Specific Antigen: 0.01 ng/mL (ref 0.00–4.00)

## 2022-06-24 NOTE — Progress Notes (Signed)
[facility]  [address] [place],  [place]  [address] [phone]  Clinic Day:  03/26/2022  Referring physician: Rochel Brome, MD   HISTORY OF PRESENT ILLNESS:  The patient is an 81 y.o. 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  He is currently on complete androgen deprivation therapy, which consists of Lupron/darolutamide.  As it pertains to his prostate cancer management, he denies having any side effects from his complete androgen blockade therapy.  He also denies having any new symptoms or findings that concern him for progression his metastatic prostate cancer.  PHYSICAL EXAM:  There were no vitals taken for this visit. Wt Readings from Last 3 Encounters:  03/26/22 196 lb 0.6 oz (88.9 kg)  03/26/22 195 lb 14.4 oz (88.9 kg)  02/16/22 196 lb (88.9 kg)   There is no height or weight on file to calculate BMI. Performance status (ECOG): 1 Physical Exam Constitutional:      General: He is not in acute distress.    Appearance: Normal appearance. He is normal weight.  HENT:     Head: Normocephalic and atraumatic.  Eyes:     General: No scleral icterus.    Extraocular Movements: Extraocular movements intact.     Conjunctiva/sclera: Conjunctivae normal.     Pupils: Pupils are equal, round, and reactive to light.  Cardiovascular:     Rate and Rhythm: Normal rate and regular rhythm.     Pulses: Normal pulses.     Heart sounds: Normal heart sounds. No murmur heard.    No friction rub. No gallop.  Pulmonary:     Effort: Pulmonary effort is normal. No respiratory distress.     Breath sounds: Normal breath sounds.  Abdominal:     General: Bowel sounds are normal. There is no distension.     Palpations: Abdomen is soft. There is no hepatomegaly, splenomegaly or mass.     Tenderness: There is no abdominal tenderness.  Musculoskeletal:        General: Normal range of  motion.     Cervical back: Normal range of motion and neck supple.     Right lower leg: No edema.     Left lower leg: No edema.  Lymphadenopathy:     Cervical: No cervical adenopathy.  Skin:    General: Skin is warm and dry.  Neurological:     General: No focal deficit present.     Mental Status: He is alert and oriented to person, place, and time. Mental status is at baseline.  Psychiatric:        Mood and Affect: Mood normal.        Behavior: Behavior normal.        Thought Content: Thought content normal.        Judgment: Judgment normal.    LABS:      Latest Ref Rng & Units 03/25/2022   12:00 AM 02/16/2022   10:46 AM 01/23/2022   12:32 PM  CBC  WBC  7.6     7.6  8.6   Hemoglobin 13.5 - 17.5 13.8     14.2  13.8   Hematocrit 41 - 53 42     41.6  40.7   Platelets 150 - 400 K/uL 277     308  307      This result is from an external source.       Latest Ref Rng &  Units 03/25/2022   12:00 AM 02/16/2022   10:46 AM 01/23/2022   12:32 PM  CMP  Glucose 70 - 99 mg/dL  85  101   BUN 4 - '21 27     17  30   '$ Creatinine 0.6 - 1.3 1.4     1.10  1.57   Sodium 137 - 147 141     142  140   Potassium 3.5 - 5.1 mEq/L 3.7     4.4  4.0   Chloride 99 - 108 103     105  99   CO2 13 - '22 27     23  25   '$ Calcium 8.7 - 10.7 9.0     9.6  9.6   Total Protein 6.0 - 8.5 g/dL  6.9  6.5   Total Bilirubin 0.0 - 1.2 mg/dL  0.6  0.3   Alkaline Phos 25 - 125 84     108  105   AST 14 - 40 '24     18  20   '$ ALT 10 - 40 U/L '17     14  12      '$ This result is from an external source.     Latest Reference Range & Units 06/24/22 10:31  Prostatic Specific Antigen 0.00 - 4.00 ng/mL 0.01   ASSESSMENT & PLAN:  Assessment/Plan:  An 81 y.o. male with metastatic prostate cancer.  His PSA level of .04 is less than the undetectable level, which is considered to be < 0.1.  Based upon this result, this gentleman is clinically considered to have a complete biochemical response to his complete androgen deprivation  therapy.  Understandably, he is pleased with the results he has gotten thus far.  He will continue taking  Lupron/darolutamide to keep his disease under ideal control.  His current darolutamide dose is 300 mg every Monday, Wednesday, and Friday.   His Lupron injections are being given every 3 months.  As his disease remains under ideal control and he is clinically doing well, I will see him back in another 3 months for repeat clinical assessment.  The patient understands all the plans discussed today and is in agreement with them.    Ashlynd Michna Macarthur Critchley, MD

## 2022-06-25 ENCOUNTER — Inpatient Hospital Stay: Payer: Medicare Other

## 2022-06-25 ENCOUNTER — Inpatient Hospital Stay (INDEPENDENT_AMBULATORY_CARE_PROVIDER_SITE_OTHER): Payer: Medicare Other | Admitting: Oncology

## 2022-06-25 ENCOUNTER — Other Ambulatory Visit: Payer: Self-pay | Admitting: Oncology

## 2022-06-25 VITALS — BP 114/71 | HR 69 | Temp 97.8°F | Resp 18 | Ht 67.0 in | Wt 192.0 lb

## 2022-06-25 VITALS — BP 126/73 | HR 71 | Temp 98.0°F | Resp 16 | Ht 67.0 in | Wt 191.1 lb

## 2022-06-25 DIAGNOSIS — C61 Malignant neoplasm of prostate: Secondary | ICD-10-CM

## 2022-06-25 DIAGNOSIS — Z5111 Encounter for antineoplastic chemotherapy: Secondary | ICD-10-CM | POA: Diagnosis not present

## 2022-06-25 DIAGNOSIS — C774 Secondary and unspecified malignant neoplasm of inguinal and lower limb lymph nodes: Secondary | ICD-10-CM | POA: Diagnosis not present

## 2022-06-25 MED ORDER — LEUPROLIDE ACETATE (3 MONTH) 22.5 MG IM KIT
22.5000 mg | PACK | Freq: Once | INTRAMUSCULAR | Status: AC
  Administered 2022-06-25: 22.5 mg via INTRAMUSCULAR
  Filled 2022-06-25: qty 22.5

## 2022-06-25 NOTE — Progress Notes (Signed)
Faxed in application for free Eliquis to [facility] Patient [facility].

## 2022-06-25 NOTE — Patient Instructions (Signed)
Leuprolide injection What is this medication? LEUPROLIDE (loo PROE lide) is a man-made hormone. It is used to treat the symptoms of prostate cancer. This medicine may also be used to treat children with early onset of puberty. It may be used for other hormonal conditions. This medicine may be used for other purposes; ask your health care provider or pharmacist if you have questions. COMMON BRAND NAME(S): Lupron What should I tell my care team before I take this medication? They need to know if you have any of these conditions: diabetes heart disease or previous heart attack high blood pressure high cholesterol pain or difficulty passing urine spinal cord metastasis stroke tobacco smoker an unusual or allergic reaction to leuprolide, benzyl alcohol, other medicines, foods, dyes, or preservatives pregnant or trying to get pregnant breast-feeding How should I use this medication? This medicine is for injection under the skin or into a muscle. You will be taught how to prepare and give this medicine. Use exactly as directed. Take your medicine at regular intervals. Do not take your medicine more often than directed. It is important that you put your used needles and syringes in a special sharps container. Do not put them in a trash can. If you do not have a sharps container, call your pharmacist or healthcare provider to get one. A special MedGuide will be given to you by the pharmacist with each prescription and refill. Be sure to read this information carefully each time. Talk to your pediatrician regarding the use of this medicine in children. While this medicine may be prescribed for children as young as 8 years for selected conditions, precautions do apply. Overdosage: If you think you have taken too much of this medicine contact a poison control center or emergency room at once. NOTE: This medicine is only for you. Do not share this medicine with others. What if I miss a dose? If you miss  a dose, take it as soon as you can. If it is almost time for your next dose, take only that dose. Do not take double or extra doses. What may interact with this medication? Do not take this medicine with any of the following medications: chasteberry cisapride dronedarone pimozide thioridazine This medicine may also interact with the following medications: herbal or dietary supplements, like black cohosh or DHEA male hormones, like estrogens or progestins and birth control pills, patches, rings, or injections male hormones, like testosterone other medicines that prolong the QT interval (abnormal heart rhythm) This list may not describe all possible interactions. Give your health care provider a list of all the medicines, herbs, non-prescription drugs, or dietary supplements you use. Also tell them if you smoke, drink alcohol, or use illegal drugs. Some items may interact with your medicine. What should I watch for while using this medication? Visit your doctor or health care professional for regular checks on your progress. During the first week, your symptoms may get worse, but then will improve as you continue your treatment. You may get hot flashes, increased bone pain, increased difficulty passing urine, or an aggravation of nerve symptoms. Discuss these effects with your doctor or health care professional, some of them may improve with continued use of this medicine. Male patients may experience a menstrual cycle or spotting during the first 2 months of therapy with this medicine. If this continues, contact your doctor or health care professional. This medicine may increase blood sugar. Ask your healthcare provider if changes in diet or medicines are needed if you have  diabetes. What side effects may I notice from receiving this medication? Side effects that you should report to your doctor or health care professional as soon as possible: allergic reactions like skin rash, itching or  hives, swelling of the face, lips, or tongue breathing problems chest pain depression or memory disorders pain in your legs or groin pain at site where injected severe headache signs and symptoms of high blood sugar such as being more thirsty or hungry or having to urinate more than normal. You may also feel very tired or have blurry vision swelling of the feet and legs visual changes vomiting Side effects that usually do not require medical attention (report to your doctor or health care professional if they continue or are bothersome): breast swelling or tenderness decrease in sex drive or performance diarrhea hot flashes loss of appetite muscle, joint, or bone pains nausea redness or irritation at site where injected skin problems or acne This list may not describe all possible side effects. Call your doctor for medical advice about side effects. You may report side effects to FDA at 1-800-FDA-1088. Where should I keep my medication? Keep out of the reach of children. Store below 25 degrees C (77 degrees F). Do not freeze. Protect from light. Do not use if it is not clear or if there are particles present. Throw away any unused medicine after the expiration date. NOTE: This sheet is a summary. It may not cover all possible information. If you have questions about this medicine, talk to your doctor, pharmacist, or health care provider.  2023 Elsevier/Gold Standard (2021-10-31 00:00:00)

## 2022-06-30 NOTE — Progress Notes (Signed)
Patient was approved for free Eliquis through [employer]. PAT - [id number] 06/30/2022 - 12/13/2022

## 2022-07-02 DIAGNOSIS — Z87891 Personal history of nicotine dependence: Secondary | ICD-10-CM | POA: Diagnosis not present

## 2022-07-02 DIAGNOSIS — R5381 Other malaise: Secondary | ICD-10-CM | POA: Diagnosis not present

## 2022-07-02 DIAGNOSIS — Z86711 Personal history of pulmonary embolism: Secondary | ICD-10-CM | POA: Diagnosis not present

## 2022-07-02 DIAGNOSIS — R0609 Other forms of dyspnea: Secondary | ICD-10-CM | POA: Diagnosis not present

## 2022-07-02 DIAGNOSIS — R9431 Abnormal electrocardiogram [ECG] [EKG]: Secondary | ICD-10-CM | POA: Insufficient documentation

## 2022-07-02 DIAGNOSIS — C61 Malignant neoplasm of prostate: Secondary | ICD-10-CM | POA: Diagnosis not present

## 2022-07-02 DIAGNOSIS — Z7901 Long term (current) use of anticoagulants: Secondary | ICD-10-CM | POA: Diagnosis not present

## 2022-07-02 DIAGNOSIS — I10 Essential (primary) hypertension: Secondary | ICD-10-CM | POA: Diagnosis not present

## 2022-07-02 DIAGNOSIS — Z86718 Personal history of other venous thrombosis and embolism: Secondary | ICD-10-CM | POA: Diagnosis not present

## 2022-07-03 ENCOUNTER — Other Ambulatory Visit: Payer: Self-pay | Admitting: Family Medicine

## 2022-07-03 DIAGNOSIS — F411 Generalized anxiety disorder: Secondary | ICD-10-CM

## 2022-07-04 ENCOUNTER — Other Ambulatory Visit: Payer: Self-pay | Admitting: Family Medicine

## 2022-07-06 DIAGNOSIS — R9431 Abnormal electrocardiogram [ECG] [EKG]: Secondary | ICD-10-CM | POA: Diagnosis not present

## 2022-07-14 DIAGNOSIS — R0609 Other forms of dyspnea: Secondary | ICD-10-CM | POA: Diagnosis not present

## 2022-07-15 DIAGNOSIS — L209 Atopic dermatitis, unspecified: Secondary | ICD-10-CM | POA: Diagnosis not present

## 2022-07-17 DIAGNOSIS — R9439 Abnormal result of other cardiovascular function study: Secondary | ICD-10-CM | POA: Diagnosis not present

## 2022-07-17 DIAGNOSIS — R9431 Abnormal electrocardiogram [ECG] [EKG]: Secondary | ICD-10-CM | POA: Diagnosis not present

## 2022-07-17 DIAGNOSIS — I10 Essential (primary) hypertension: Secondary | ICD-10-CM | POA: Diagnosis not present

## 2022-07-17 DIAGNOSIS — R5381 Other malaise: Secondary | ICD-10-CM | POA: Diagnosis not present

## 2022-07-17 DIAGNOSIS — R0609 Other forms of dyspnea: Secondary | ICD-10-CM | POA: Diagnosis not present

## 2022-07-17 DIAGNOSIS — R5383 Other fatigue: Secondary | ICD-10-CM | POA: Diagnosis not present

## 2022-07-17 DIAGNOSIS — Z7901 Long term (current) use of anticoagulants: Secondary | ICD-10-CM | POA: Diagnosis not present

## 2022-07-17 HISTORY — PX: CARDIAC CATHETERIZATION: SHX172

## 2022-07-21 ENCOUNTER — Ambulatory Visit (INDEPENDENT_AMBULATORY_CARE_PROVIDER_SITE_OTHER): Payer: Medicare Other | Admitting: Family Medicine

## 2022-07-21 ENCOUNTER — Encounter: Payer: Self-pay | Admitting: Family Medicine

## 2022-07-21 VITALS — BP 136/60 | HR 72 | Temp 97.2°F | Resp 18 | Ht 67.0 in | Wt 189.0 lb

## 2022-07-21 DIAGNOSIS — M62838 Other muscle spasm: Secondary | ICD-10-CM | POA: Diagnosis not present

## 2022-07-21 NOTE — Progress Notes (Signed)
 Acute Office Visit  Subjective:    Patient ID: Marcus King, male    DOB: 11/10/1941, 81 y.o.   MRN: [id number]  Chief Complaint  Patient presents with   Neck Pain   Shoulder Pain    HPI: Patient is in today with right neck pain/right shoulder pain and radiates down right arm.  His symptoms started on Friday which he pushed himself up from the floor and he felt pain in his neck.  His symptoms have continued to worsen over the last 48 hours. 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, NYHA class 1 (HCC) 09/21/2018   Dyslipidemia 08/11/2017   Dyspnea on exertion 04/23/2020   Edema extremities 04/14/2018   Elevated glucose 10/17/2019   Fatigue 04/23/2020   Food poisoning    GAD (generalized anxiety disorder)    H/O deep venous thrombosis 04/14/2021   Headache    sinus   History of pulmonary embolism 06/13/2020   History of pulmonary embolus (PE) 06/13/2020   Hypertension    Hypertensive heart disease 08/05/2017   Hypoxia 10/17/2019   Inguinal lymphadenopathy 05/26/2020   LVH (left ventricular hypertrophy) due to hypertensive disease 08/11/2017   Malignant neoplasm of prostate (HCC) 09/09/2020   Mixed hyperlipidemia    Orthostatic hypotension    Orthostatic syncope 10/17/2019   OSA on CPAP    Prostate cancer (HCC)    Pulmonary embolism (HCC) 04/2020   PVC's (premature ventricular contractions) 05/18/2018   Rectal mass 05/24/2020   S/P TKR (total knee replacement) using cement, 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   Solid nodule of lung greater than 8 mm in diameter 05/26/2020    Past Surgical History:  Procedure Laterality Date   CARDIAC CATHETERIZATION     CATARACT EXTRACTION W/ INTRAOCULAR LENS  IMPLANT, BILATERAL Bilateral    COLON SURGERY  2014   COLOSTOMY REVERSAL  2014    HERNIA REPAIR     x2   JOINT REPLACEMENT     PROSTATE SURGERY  2006   SEPTOPLASTY     TOTAL KNEE ARTHROPLASTY Left 09/28/2016   Procedure: TOTAL KNEE ARTHROPLASTY;  Surgeon: Steve Lucey, MD;  Location: [facility];  Service: Orthopedics;  Laterality: Left;    Family History  Problem Relation Age of Onset   Stroke Mother    Parkinson's disease Mother    Lung disease Father    Stroke Father    Alcoholism Father    Leukemia Sister    Muscular dystrophy Son        Becker's   Alzheimer's disease Neg Hx     Social History   Socioeconomic History   Marital status: Married    Spouse name: Carolyn   Number of children: 2   Years of education: Not on file   Highest education level: High school graduate  Occupational History   Occupation: Corporate Recruiter    Comment: ICDs/Wound Care  Tobacco Use   Smoking status: Former    Years: 20.00    Types: Cigarettes    Quit date: 1979    Years since quitting: 44.6   Smokeless tobacco: Never  Vaping Use   Vaping Use: Never used  Substance and Sexual Activity   Alcohol use: No   Drug use: Never   Sexual activity: Not on file    Other Topics Concern   Not on file  Social History Narrative   LIves at home with his wife   Daughter lives in [facility], Son lives in [place]   Right handed   Drinks about 2 cups of caffeine daily   Social Determinants of Health   Financial Resource Strain: Medium Risk (05/28/2022)   Overall Financial Resource Strain (CARDIA)    Difficulty of Paying Living Expenses: Somewhat hard  Food Insecurity: No Food Insecurity (02/04/2022)   Hunger Vital Sign    Worried About Running Out of Food in the Last Year: Never true    Ran Out of Food in the Last Year: Never true  Transportation Needs: No Transportation Needs (02/04/2022)   PRAPARE - Hydrologist (Medical): No    Lack of Transportation (Non-Medical): No  Physical Activity: Insufficiently Active (05/28/2022)   Exercise Vital  Sign    Days of Exercise per Week: 2 days    Minutes of Exercise per Session: 10 min  Stress: Not on file  Social Connections: Socially Integrated (02/04/2022)   Social Connection and Isolation Panel [NHANES]    Frequency of Communication with Friends and Family: More than three times a week    Frequency of Social Gatherings with Friends and Family: Twice a week    Attends Religious Services: More than 4 times per year    Active Member of [employer] or Organizations: Yes    Attends Music therapist: More than 4 times per year    Marital Status: Married  Human resources officer Violence: Not At Risk (02/04/2022)   Humiliation, Afraid, Rape, and Kick questionnaire    Fear of Current or Ex-Partner: No    Emotionally Abused: No    Physically Abused: No    Sexually Abused: No    Outpatient Medications Prior to Visit  Medication Sig Dispense Refill   amLODipine (NORVASC) 5 MG tablet Take 1 tablet (5 mg total) by mouth in the morning and at bedtime. 180 tablet 0   apixaban (ELIQUIS) 5 MG TABS tablet Take 1 tablet (5 mg total) by mouth in the morning and at bedtime. 180 tablet 3   busPIRone (BUSPAR) 30 MG tablet Take 1 tablet (30 mg total) by mouth in the morning and at bedtime. 60 tablet 2   carvedilol (COREG) 25 MG tablet take 1 tablet (25 mg) by oral route 2 times per day with food 180 tablet 0   clobetasol cream (TEMOVATE) 0.05 % Apply topically 2 (two) times daily as needed.     darolutamide (NUBEQA) 300 MG tablet Take 1 tablet (300 mg total) by mouth daily. 3 days each week 120 tablet 3   Dupilumab (DUPIXENT [place]) Inject into the skin. Every 2-4 weeks     furosemide (LASIX) 20 MG tablet Take 2 tablets (40 mg total) by mouth daily. 180 tablet 0   hydrOXYzine (ATARAX) 25 MG tablet Take 1 tablet (25 mg total) by mouth every 8 (eight) hours as needed. 270 tablet 1   Leuprolide Acetate, 3 Month, (LUPRON DEPOT, 74-MONTH, IM) Inject into the muscle.     loratadine (CLARITIN) 10 MG tablet Take by  mouth.     mupirocin ointment (BACTROBAN) 2 % Apply topically 3 (three) times daily.     PARoxetine (PAXIL) 40 MG tablet Take 1 tablet (40 mg total) by mouth daily. 90 tablet 1   pravastatin (PRAVACHOL) 20 MG tablet Take 1 tablet (20 mg total) by mouth daily. 90 tablet 1  triamcinolone cream (KENALOG) 0.1 % Apply topically 2 (two) times daily as needed.     triamcinolone ointment (KENALOG) 0.5 % Apply topically.     Vitamin D, Ergocalciferol, (DRISDOL) 1.25 MG (50000 UNIT) CAPS capsule Take 1 capsule (50,000 Units total) by mouth 2 (two) times a week. 24 capsule 1   augmented betamethasone dipropionate (DIPROLENE) 0.05 % ointment Apply topically 2 (two) times daily. 100 g 1   olmesartan (BENICAR) 40 MG tablet Take 1 tablet (40 mg total) by mouth at bedtime. (Patient not taking: Reported on 05/28/2022) 90 tablet 1   No facility-administered medications prior to visit.    Allergies  Allergen Reactions   Beef-Derived Products Diarrhea   Clonazepam Other (See Comments)    High doses cause drunk feeling (>1 mg)   Monosodium Glutamate Other (See Comments)    MSG (soups and oriental foods)   Pegademase Bovine Diarrhea    Review of Systems  Constitutional:  Positive for fatigue. Negative for chills and fever.  Respiratory:  Negative for shortness of breath.   Cardiovascular:  Negative for chest pain.  Musculoskeletal:  Positive for neck pain (pushed up with right arm and felt pain in neck).  Neurological:  Positive for dizziness. Negative for headaches.       Balance issues  Psychiatric/Behavioral:  Negative for dysphoric mood.        Objective:    Physical Exam Vitals reviewed.  Constitutional:      Appearance: Normal appearance.  Cardiovascular:     Rate and Rhythm: Normal rate and regular rhythm.     Heart sounds: Normal heart sounds.  Pulmonary:     Effort: Pulmonary effort is normal.     Breath sounds: Normal breath sounds.  Musculoskeletal:        General: Tenderness  (right sided cervical paraspinal muscles and rt trapezius/rt side of neck. ROM neck: abnormal. ROM right shoulder normal. Nontender over right shoulder.) present.  Neurological:     Mental Status: He is alert.     BP 136/60   Pulse 72   Temp (!) 97.2 F (36.2 C)   Resp 18   Ht 5' 7" (1.702 m)   Wt 189 lb (85.7 kg)   BMI 29.60 kg/m  Wt Readings from Last 3 Encounters:  07/21/22 189 lb (85.7 kg)  06/25/22 192 lb (87.1 kg)  06/25/22 191 lb 1.6 oz (86.7 kg)    Health Maintenance Due  Topic Date Due   COVID-19 Vaccine (2 - [place] risk series) 09/24/2021   INFLUENZA VACCINE  07/14/2022    There are no preventive care reminders to display for this patient.   Lab Results  Component Value Date   TSH 1.260 01/23/2022   Lab Results  Component Value Date   WBC 7.6 03/25/2022   HGB 13.8 03/25/2022   HCT 42 03/25/2022   MCV 87 02/16/2022   PLT 277 03/25/2022   Lab Results  Component Value Date   NA 141 03/25/2022   K 3.7 03/25/2022   CO2 27 (A) 03/25/2022   GLUCOSE 85 02/16/2022   BUN 27 (A) 03/25/2022   CREATININE 1.4 (A) 03/25/2022   BILITOT 0.6 02/16/2022   ALKPHOS 84 03/25/2022   AST 24 03/25/2022   ALT 17 03/25/2022   PROT 6.9 02/16/2022   ALBUMIN 4.2 03/25/2022   CALCIUM 9.0 03/25/2022   ANIONGAP 9 01/09/2019   EGFR 67 02/16/2022   Lab Results  Component Value Date   CHOL 135 02/16/2022   Lab Results    Component Value Date   HDL 50 02/16/2022   Lab Results  Component Value Date   LDLCALC 72 02/16/2022   Lab Results  Component Value Date   TRIG 60 02/16/2022   Lab Results  Component Value Date   CHOLHDL 2.7 02/16/2022   Lab Results  Component Value Date   HGBA1C 5.6 12/01/2017       Assessment & Plan:   Problem List Items Addressed This Visit       Musculoskeletal and Integument   Neck muscle spasm - Primary    Recommend alternate ice and heat. 10 minutes off and on.  Trial lidocaine patches otc (salon pos) or his wife has some rx  lidoderm patches she may use for 12 hours on and the patch free for 12 hours.  May also try diclofenac gel (voltaren gel.) Exercises. Call if not improving for an xray.       Follow-up: Return in about 6 weeks (around 09/01/2022) for chronic fasting.  An After Visit Summary was printed and given to the patient.   , MD  Family Practice [phone] 

## 2022-07-21 NOTE — Patient Instructions (Addendum)
Recommend alternate ice and heat. 10 minutes off and on.  Trial lidocaine patches otc (salon pos) or his wife has some rx lidoderm patches she may use for 12 hours on and the patch free for 12 hours.  May also try diclofenac gel (voltaren gel.)

## 2022-07-21 NOTE — Assessment & Plan Note (Addendum)
Recommend alternate ice and heat. 10 minutes off and on.  Trial lidocaine patches otc (salon pos) or his wife has some rx lidoderm patches she may use for 12 hours on and the patch free for 12 hours.  May also try diclofenac gel (voltaren gel.) Exercises. Call if not improving for an xray.

## 2022-07-27 DIAGNOSIS — I2510 Atherosclerotic heart disease of native coronary artery without angina pectoris: Secondary | ICD-10-CM | POA: Diagnosis not present

## 2022-07-27 DIAGNOSIS — R001 Bradycardia, unspecified: Secondary | ICD-10-CM | POA: Diagnosis not present

## 2022-07-28 DIAGNOSIS — I2510 Atherosclerotic heart disease of native coronary artery without angina pectoris: Secondary | ICD-10-CM | POA: Diagnosis not present

## 2022-08-02 ENCOUNTER — Other Ambulatory Visit: Payer: Self-pay | Admitting: Family Medicine

## 2022-08-03 ENCOUNTER — Ambulatory Visit: Payer: Medicare Other | Admitting: Adult Health

## 2022-08-03 ENCOUNTER — Encounter: Payer: Self-pay | Admitting: Adult Health

## 2022-08-03 ENCOUNTER — Ambulatory Visit (INDEPENDENT_AMBULATORY_CARE_PROVIDER_SITE_OTHER): Payer: Medicare Other | Admitting: Adult Health

## 2022-08-03 VITALS — BP 107/64 | HR 66 | Ht 70.0 in | Wt 192.0 lb

## 2022-08-03 DIAGNOSIS — G4733 Obstructive sleep apnea (adult) (pediatric): Secondary | ICD-10-CM

## 2022-08-03 NOTE — Patient Instructions (Signed)
Continue using CPAP nightly and greater than 4 hours each night °If your symptoms worsen or you develop new symptoms please let us know.  ° °

## 2022-08-03 NOTE — Progress Notes (Signed)
PATIENT: Marcus King DOB: 1941-11-16  REASON FOR VISIT: follow up HISTORY FROM: patient Primary Neurologist: Dr. Brett Fairy  Chief Complaint  Patient presents with   Follow-up    Pt in 4 with wife  Pt states no questions or concerns for todays visit Pt had Heart cath last week. Pt states 95% blockage in a main artery      HISTORY OF PRESENT ILLNESS: Today 08/03/22:   Marcus King is an 81 year old male with a history of obstructive sleep apnea on BiPAP.  He returns today for follow-up.  His download indicates that he uses his machine 29/30 nights compliance of 96.7%.  He uses machine greater than 4 hours 29 out of 30 days for compliance of 96.7%.  His residual AHI is 8.4 on 14 over 18 cm of water.  He denies any issues with his machine.  Reports that he still notices the benefit.  He does feel that the mask leaks at times.  His wife also notices it.  He denies any significant changes in his weight..  Returns today for an evaluation.  Had a cardiac stent placed a week ago. Had 95 % blockage.   07/30/21: Marcus King is an 81 year old male with a history of obstructive sleep apnea on BiPAP.  His download indicates that he uses machine nightly for compliance of 100%.  He uses machine greater than 4 hours 29 out of 30 days for compliance of 96.7%.  His residual AHI is 4.1 on 14/18 centimeters of water.  He reports that the machine works well.  He states that his mask is so tight that it leaves indentions on his face.  He would like to try a different style if possible.  07/29/20: Marcus King is an 81 year old male with a history of obstructive sleep apnea on BiPAP.  He returns today for follow-up.  His download indicates that he use his machine nightly for compliance of 100%.  He uses machine greater than 4 hours each night.  On average he uses his machine 8 hours and 20 minutes.  His residual AHI is 6.3 on 18/14 centimeters of water.  He reports that he does not like his mask.  And would like to  try different one.  He reports that since the last visit he was put on supplemental O2 at night, Eliquis due to blood thinners and diagnosed with prostate cancer.  He returns today for an evaluation.   REVIEW OF SYSTEMS: Out of a complete 14 system review of symptoms, the patient complains only of the following symptoms, and all other reviewed systems are negative.   ESS 7  ALLERGIES: Allergies  Allergen Reactions   Beef-Derived Products Diarrhea   Clonazepam Other (See Comments)    High doses cause drunk feeling (>1 mg)   Monosodium Glutamate Other (See Comments)    MSG (soups and oriental foods)   Pegademase Bovine Diarrhea    HOME MEDICATIONS: Outpatient Medications Prior to Visit  Medication Sig Dispense Refill   amLODipine (NORVASC) 5 MG tablet Take 1 tablet (5 mg total) by mouth in the morning and at bedtime. 180 tablet 0   apixaban (ELIQUIS) 5 MG TABS tablet Take 1 tablet (5 mg total) by mouth in the morning and at bedtime. 180 tablet 3   busPIRone (BUSPAR) 30 MG tablet Take 1 tablet (30 mg total) by mouth in the morning and at bedtime. 60 tablet 2   carvedilol (COREG) 25 MG tablet take 1 tablet (25 mg) by oral route  2 times per day with food 180 tablet 0   clobetasol cream (TEMOVATE) 0.05 % Apply topically 2 (two) times daily as needed.     darolutamide (NUBEQA) 300 MG tablet Take 1 tablet (300 mg total) by mouth daily. 3 days each week 120 tablet 3   Dupilumab (DUPIXENT [place]) Inject into the skin. Every 2-4 weeks     furosemide (LASIX) 20 MG tablet Take 2 tablets (40 mg total) by mouth daily. 180 tablet 0   hydrOXYzine (ATARAX) 25 MG tablet Take 1 tablet (25 mg total) by mouth every 8 (eight) hours as needed. 270 tablet 1   Leuprolide Acetate, 3 Month, (LUPRON DEPOT, 48-MONTH, IM) Inject into the muscle.     loratadine (CLARITIN) 10 MG tablet Take by mouth.     mupirocin ointment (BACTROBAN) 2 % Apply topically 3 (three) times daily.     PARoxetine (PAXIL) 40 MG tablet Take 1  tablet (40 mg total) by mouth daily. 90 tablet 1   pravastatin (PRAVACHOL) 20 MG tablet Take 1 tablet (20 mg total) by mouth daily. 90 tablet 1   triamcinolone cream (KENALOG) 0.1 % Apply topically 2 (two) times daily as needed.     triamcinolone ointment (KENALOG) 0.5 % Apply topically.     Vitamin D, Ergocalciferol, (DRISDOL) 1.25 MG (50000 UNIT) CAPS capsule Take 1 capsule (50,000 Units total) by mouth 2 (two) times a week. 24 capsule 1   No facility-administered medications prior to visit.    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, NYHA class 1 ([place]) 09/21/2018   Dyslipidemia 08/11/2017   Dyspnea on exertion 04/23/2020   Edema extremities 04/14/2018   Elevated glucose 10/17/2019   Fatigue 04/23/2020   Food poisoning    GAD (generalized anxiety disorder)    H/O deep venous thrombosis 04/14/2021   Headache    sinus   History of pulmonary embolism 06/13/2020   History of pulmonary embolus (PE) 06/13/2020   Hypertension    Hypertensive heart disease 08/05/2017   Hypoxia 10/17/2019   Inguinal lymphadenopathy 05/26/2020   LVH (left ventricular hypertrophy) due to hypertensive disease 08/11/2017   Malignant neoplasm of prostate ([place]) 09/09/2020   Mixed hyperlipidemia    Orthostatic hypotension    Orthostatic syncope 10/17/2019   OSA on CPAP    Prostate cancer ([place])    Pulmonary embolism ([place]) 04/2020   PVC's (premature ventricular contractions) 05/18/2018   Rectal mass 05/24/2020   S/P TKR (total knee replacement) using cement, 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   Solid nodule of lung greater than 8 mm in diameter 05/26/2020    PAST SURGICAL HISTORY: Past Surgical History:  Procedure Laterality Date   CARDIAC CATHETERIZATION     CATARACT EXTRACTION W/ INTRAOCULAR LENS  IMPLANT,  BILATERAL Bilateral    COLON SURGERY  2014   COLOSTOMY REVERSAL  2014   HERNIA REPAIR     x2   JOINT REPLACEMENT     PROSTATE SURGERY  2006   SEPTOPLASTY     TOTAL KNEE ARTHROPLASTY Left 09/28/2016   Procedure: TOTAL KNEE ARTHROPLASTY;  Surgeon: [name], MD;  Location: [facility];  Service: Orthopedics;  Laterality: Left;    FAMILY HISTORY: Family History  Problem Relation Age of Onset   Stroke Mother    Parkinson's disease Mother  Lung disease Father    Stroke Father    Alcoholism Father    Leukemia Sister    Muscular dystrophy Son        Becker's   Alzheimer's disease Neg Hx     SOCIAL HISTORY: Social History   Socioeconomic History   Marital status: Married    Spouse name: Marcus King   Number of children: 2   Years of education: Not on file   Highest education level: High school graduate  Occupational History   Occupation: Engineer, petroleum    Comment: ICDs/Wound Care  Tobacco Use   Smoking status: Former    Years: 20.00    Types: Cigarettes    Quit date: 1979    Years since quitting: 44.6   Smokeless tobacco: Never  Vaping Use   Vaping Use: Never used  Substance and Sexual Activity   Alcohol use: No   Drug use: Never   Sexual activity: Not on file  Other Topics Concern   Not on file  Social History Narrative   LIves at home with his wife   Daughter lives in [facility], Son lives in [place]   Right handed   Drinks about 2 cups of caffeine daily   Social Determinants of Health   Financial Resource Strain: Medium Risk (05/28/2022)   Overall Financial Resource Strain (CARDIA)    Difficulty of Paying Living Expenses: Somewhat hard  Food Insecurity: No Food Insecurity (02/04/2022)   Hunger Vital Sign    Worried About Running Out of Food in the Last Year: Never true    [place] in the Last Year: Never true  Transportation Needs: No Transportation Needs (02/04/2022)   PRAPARE - Hydrologist (Medical): No    Lack of  Transportation (Non-Medical): No  Physical Activity: Insufficiently Active (05/28/2022)   Exercise Vital Sign    Days of Exercise per Week: 2 days    Minutes of Exercise per Session: 10 min  Stress: Not on file  Social Connections: Socially Integrated (02/04/2022)   Social Connection and Isolation Panel [NHANES]    Frequency of Communication with Friends and Family: More than three times a week    Frequency of Social Gatherings with Friends and Family: Twice a week    Attends Religious Services: More than 4 times per year    Active Member of [employer] or Organizations: Yes    Attends Archivist Meetings: More than 4 times per year    Marital Status: Married  Human resources officer Violence: Not At Risk (02/04/2022)   Humiliation, Afraid, Rape, and Kick questionnaire    Fear of Current or Ex-Partner: No    Emotionally Abused: No    Physically Abused: No    Sexually Abused: No      PHYSICAL EXAM  Vitals:   08/03/22 1303  BP: 107/64  Pulse: 66  Weight: 192 lb (87.1 kg)  Height: '5\' 10"'$  (1.778 m)    Body mass index is 27.55 kg/m.  Generalized: Well developed, in no acute distress  Chest: Lungs clear to auscultation bilaterally  Neurological examination  Mentation: Alert oriented to time, place, history taking. Follows all commands speech and language fluent Cranial nerve II-XII: Extraocular movements were full, visual field were full on confrontational test Head turning and shoulder shrug  were normal and symmetric.  Gait and station: Gait is normal.    DIAGNOSTIC DATA (LABS, IMAGING, TESTING) - I reviewed patient records, labs, notes, testing and imaging myself where available.  Lab Results  Component Value Date   WBC 7.6 03/25/2022   HGB 13.8 03/25/2022   HCT 42 03/25/2022   MCV 87 02/16/2022   PLT 277 03/25/2022      Component Value Date/Time   NA 141 03/25/2022 0000   K 3.7 03/25/2022 0000   CL 103 03/25/2022 0000   CO2 27 (A) 03/25/2022 0000   GLUCOSE 85  02/16/2022 1046   GLUCOSE 100 (H) 01/09/2019 2020   BUN 27 (A) 03/25/2022 0000   CREATININE 1.4 (A) 03/25/2022 0000   CREATININE 1.10 02/16/2022 1046   CALCIUM 9.0 03/25/2022 0000   PROT 6.9 02/16/2022 1046   ALBUMIN 4.2 03/25/2022 0000   ALBUMIN 4.6 02/16/2022 1046   AST 24 03/25/2022 0000   ALT 17 03/25/2022 0000   ALKPHOS 84 03/25/2022 0000   BILITOT 0.6 02/16/2022 1046   GFRNONAA 67 01/07/2021 1649   GFRAA 77 01/07/2021 1649   Lab Results  Component Value Date   CHOL 135 02/16/2022   HDL 50 02/16/2022   LDLCALC 72 02/16/2022   TRIG 60 02/16/2022   CHOLHDL 2.7 02/16/2022   Lab Results  Component Value Date   HGBA1C 5.6 12/01/2017   Lab Results  Component Value Date   VITAMINB12 408 01/23/2022   Lab Results  Component Value Date   TSH 1.260 01/23/2022      ASSESSMENT AND PLAN 81 y.o. 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 diastolic dysfunction, NYHA class 1 ([place]) (09/21/2018), Dyslipidemia (08/11/2017), Dyspnea on exertion (04/23/2020), Edema extremities (04/14/2018), Elevated glucose (10/17/2019), Fatigue (04/23/2020), Food poisoning, GAD (generalized anxiety disorder), H/O deep venous thrombosis (04/14/2021), Headache, History of pulmonary embolism (06/13/2020), History of pulmonary embolus (PE) (06/13/2020), Hypertension, Hypertensive heart disease (08/05/2017), Hypoxia (10/17/2019), Inguinal lymphadenopathy (05/26/2020), LVH (left ventricular hypertrophy) due to hypertensive disease (08/11/2017), Malignant neoplasm of prostate ([place]) (09/09/2020), Mixed hyperlipidemia, Orthostatic hypotension, Orthostatic syncope (10/17/2019), OSA on CPAP, Prostate cancer ([place]), Pulmonary embolism ([place]) (04/2020), PVC's (premature ventricular contractions) (05/18/2018), Rectal mass (05/24/2020), S/P TKR (total knee  replacement) using cement, left (09/28/2016), and Solid nodule of lung greater than 8 mm in diameter (05/26/2020). here with:  OSA on BiPAP  - BiPAP compliance excellent -Residual AHI slightly elevated 8.4 previously was 4.1 -Mask refitting ordered.  Advised that once he gets a new mask if he wants to have his look at a download in 30 to 60 days we can see if his AHI has improved. - Encourage patient to use CPAP nightly and > 4 hours each night - F/U in 1 year or sooner if needed     Ward Givens, MSN, NP-C 08/03/2022, 12:45 PM [facility] [address], [place], [place] [address] [phone]

## 2022-08-04 NOTE — Progress Notes (Signed)
Fax confirmation to [facility] new order [phone]. [phone] received.

## 2022-08-12 ENCOUNTER — Telehealth: Payer: Self-pay

## 2022-08-12 DIAGNOSIS — L209 Atopic dermatitis, unspecified: Secondary | ICD-10-CM | POA: Diagnosis not present

## 2022-08-12 NOTE — Telephone Encounter (Signed)
[facility] Has sent a fax stating that the patient is requesting a 90 day supply of the Chlorthalidone 25 mg. By looking at patient's med list it looks like its not in the current medication list.

## 2022-08-15 ENCOUNTER — Other Ambulatory Visit: Payer: Self-pay | Admitting: Family Medicine

## 2022-08-18 ENCOUNTER — Other Ambulatory Visit: Payer: Self-pay | Admitting: Physician Assistant

## 2022-08-19 DIAGNOSIS — I10 Essential (primary) hypertension: Secondary | ICD-10-CM | POA: Diagnosis not present

## 2022-08-19 DIAGNOSIS — R0609 Other forms of dyspnea: Secondary | ICD-10-CM | POA: Diagnosis not present

## 2022-08-20 ENCOUNTER — Encounter: Payer: Self-pay | Admitting: Family Medicine

## 2022-08-20 ENCOUNTER — Ambulatory Visit (INDEPENDENT_AMBULATORY_CARE_PROVIDER_SITE_OTHER): Payer: Medicare Other | Admitting: Family Medicine

## 2022-08-20 VITALS — BP 118/60 | HR 59 | Temp 97.7°F | Resp 15 | Ht 70.0 in | Wt 194.0 lb

## 2022-08-20 DIAGNOSIS — I5032 Chronic diastolic (congestive) heart failure: Secondary | ICD-10-CM

## 2022-08-20 DIAGNOSIS — E782 Mixed hyperlipidemia: Secondary | ICD-10-CM

## 2022-08-20 DIAGNOSIS — I110 Hypertensive heart disease with heart failure: Secondary | ICD-10-CM

## 2022-08-20 DIAGNOSIS — L2081 Atopic neurodermatitis: Secondary | ICD-10-CM

## 2022-08-20 DIAGNOSIS — F411 Generalized anxiety disorder: Secondary | ICD-10-CM | POA: Diagnosis not present

## 2022-08-20 DIAGNOSIS — E559 Vitamin D deficiency, unspecified: Secondary | ICD-10-CM | POA: Diagnosis not present

## 2022-08-20 DIAGNOSIS — C61 Malignant neoplasm of prostate: Secondary | ICD-10-CM | POA: Diagnosis not present

## 2022-08-20 DIAGNOSIS — G4733 Obstructive sleep apnea (adult) (pediatric): Secondary | ICD-10-CM

## 2022-08-20 DIAGNOSIS — Z23 Encounter for immunization: Secondary | ICD-10-CM | POA: Diagnosis not present

## 2022-08-20 DIAGNOSIS — Z86711 Personal history of pulmonary embolism: Secondary | ICD-10-CM

## 2022-08-20 DIAGNOSIS — C7989 Secondary malignant neoplasm of other specified sites: Secondary | ICD-10-CM | POA: Diagnosis not present

## 2022-08-20 NOTE — Assessment & Plan Note (Signed)
Management per specialist. 

## 2022-08-20 NOTE — Assessment & Plan Note (Signed)
The current medical regimen is effective;  continue present plan and medications.  

## 2022-08-20 NOTE — Assessment & Plan Note (Signed)
Well controlled.  No changes to medicines. Amlodipine 5 mg twice a day, Carvedilol 25 mg twice a day Continue to work on eating a healthy diet and exercise.  Labs drawn today.

## 2022-08-20 NOTE — Progress Notes (Unsigned)
Subjective:  Patient ID: Marcus King, male    DOB: February 05, 1941  Age: 81 y.o. MRN: [id number]  Chief Complaint  Patient presents with   Hypertension   Hyperlipidemia    HPI Hypertension: Taking Amlodipine 5 mg twice a day, Carvedilol 25 mg twice a day, lisinopril 20 mg twice daily. Bp is doing well. Takes bp about 6 per day.   Hyperlipidemia: He takes Pravastatin 20 mg daily.  GAD:  Buspar 15 mg twice a day, Paroxetine 40 mg daily.  CORONARY ARTERY DISEASE: LHC done Mid RCA stented. Aspirin , coreg, and pravastatin.   Atopic Dermatitis: Taking Dupixent inject 2-4 weeks, Hydroxyzine 25 mg every 8 hours PRN.  Prostate Cancer: Lupron Depot 3 month IM, NubeQa 300 mg 3 times per week.  Pulmonary embolus s/p covid 19.  Current Outpatient Medications on File Prior to Visit  Medication Sig Dispense Refill   amLODipine (NORVASC) 5 MG tablet Take 1 tablet (5 mg total) by mouth in the morning and at bedtime. 180 tablet 0   apixaban (ELIQUIS) 5 MG TABS tablet Take 1 tablet (5 mg total) by mouth in the morning and at bedtime. 180 tablet 3   busPIRone (BUSPAR) 15 MG tablet Take by mouth.     carvedilol (COREG) 25 MG tablet take 1 tablet (25 mg) by oral route 2 times per day with food 180 tablet 0   clobetasol cream (TEMOVATE) 0.05 % Apply topically 2 (two) times daily as needed.     darolutamide (NUBEQA) 300 MG tablet Take 1 tablet (300 mg total) by mouth daily. 3 days each week 120 tablet 3   Dupilumab 100 MG/0.67ML SOSY Inject into the skin.     hydrOXYzine (ATARAX) 25 MG tablet Take 1 tablet (25 mg total) by mouth every 8 (eight) hours as needed. 270 tablet 1   Leuprolide Acetate, 3 Month, (LUPRON DEPOT, 63-MONTH, IM) Inject into the muscle.     loratadine (CLARITIN) 10 MG tablet Take by mouth.     mupirocin ointment (BACTROBAN) 2 % Apply topically 3 (three) times daily.     PARoxetine (PAXIL) 40 MG tablet Take 1 tablet (40 mg total) by mouth daily. 90 tablet 1   pravastatin  (PRAVACHOL) 20 MG tablet Take 1 tablet (20 mg total) by mouth daily. 90 tablet 1   Vitamin D, Ergocalciferol, (DRISDOL) 1.25 MG (50000 UNIT) CAPS capsule Take 1 capsule (50,000 Units total) by mouth 2 (two) times a week. 24 capsule 1   No current facility-administered medications on file prior to visit.   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, NYHA class 1 ([place]) 09/21/2018   Dyslipidemia 08/11/2017   Dyspnea on exertion 04/23/2020   Edema extremities 04/14/2018   Elevated glucose 10/17/2019   Fatigue 04/23/2020   Food poisoning    GAD (generalized anxiety disorder)    H/O deep venous thrombosis 04/14/2021   Headache    sinus   History of pulmonary embolism 06/13/2020   History of pulmonary embolus (PE) 06/13/2020   Hypertension    Hypertensive heart disease 08/05/2017   Hypoxia 10/17/2019   Inguinal lymphadenopathy 05/26/2020   LVH (left ventricular hypertrophy) due to hypertensive disease 08/11/2017   Malignant neoplasm of prostate ([place]) 09/09/2020   Mixed hyperlipidemia    Orthostatic hypotension  Orthostatic syncope 10/17/2019   OSA on CPAP    Prostate cancer ([place])    Pulmonary embolism ([place]) 04/2020   PVC's (premature ventricular contractions) 05/18/2018   Rectal mass 05/24/2020   S/P TKR (total knee replacement) using cement, 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   Solid nodule of lung greater than 8 mm in diameter 05/26/2020   Past Surgical History:  Procedure Laterality Date   CARDIAC CATHETERIZATION  07/17/2022   CATARACT EXTRACTION W/ INTRAOCULAR LENS  IMPLANT, BILATERAL Bilateral    COLON SURGERY  2014   COLOSTOMY REVERSAL  2014   HERNIA REPAIR     x2   JOINT REPLACEMENT     PROSTATE SURGERY  2006   SEPTOPLASTY     TOTAL KNEE ARTHROPLASTY Left 09/28/2016   Procedure: TOTAL KNEE  ARTHROPLASTY;  Surgeon: [name], MD;  Location: [facility];  Service: Orthopedics;  Laterality: Left;    Family History  Problem Relation Age of Onset   Stroke Mother    Parkinson's disease Mother    Lung disease Father    Stroke Father    Alcoholism Father    Leukemia Sister    Muscular dystrophy Son        Becker's   Alzheimer's disease Neg Hx    Social History   Socioeconomic History   Marital status: Married    Spouse name: Hoyle Sauer   Number of children: 2   Years of education: Not on file   Highest education level: High school graduate  Occupational History   Occupation: Engineer, petroleum    Comment: ICDs/Wound Care  Tobacco Use   Smoking status: Former    Years: 20.00    Types: Cigarettes    Quit date: 1979    Years since quitting: 44.7   Smokeless tobacco: Never  Vaping Use   Vaping Use: Never used  Substance and Sexual Activity   Alcohol use: No   Drug use: Never   Sexual activity: Not on file  Other Topics Concern   Not on file  Social History Narrative   LIves at home with his wife   Daughter lives in [place], Son lives in [place]   Right handed   Drinks about 2 cups of caffeine daily   Social Determinants of Health   Financial Resource Strain: Medium Risk (05/28/2022)   Overall Financial Resource Strain (CARDIA)    Difficulty of Paying Living Expenses: Somewhat hard  Food Insecurity: No Food Insecurity (02/04/2022)   Hunger Vital Sign    Worried About Running Out of Food in the Last Year: Never true    Ran Out of Food in the Last Year: Never true  Transportation Needs: No Transportation Needs (02/04/2022)   PRAPARE - Hydrologist (Medical): No    Lack of Transportation (Non-Medical): No  Physical Activity: Insufficiently Active (05/28/2022)   Exercise Vital Sign    Days of Exercise per Week: 2 days    Minutes of Exercise per Session: 10 min  Stress: Not on file  Social Connections: Socially Integrated  (02/04/2022)   Social Connection and Isolation Panel [NHANES]    Frequency of Communication with Friends and Family: More than three times a week    Frequency of Social Gatherings with Friends and Family: Twice a week    Attends Religious Services: More than 4 times per year    Active Member of [employer] or Organizations: Yes    Attends Archivist Meetings: More than 4 times per year  Marital Status: Married    Review of Systems  Constitutional:  Negative for chills, fatigue, fever and unexpected weight change.  HENT:  Negative for congestion, ear pain, sinus pain and sore throat.   Respiratory:  Negative for cough and shortness of breath.   Cardiovascular:  Negative for chest pain and palpitations.  Gastrointestinal:  Negative for abdominal pain, blood in stool, constipation, diarrhea, nausea and vomiting.  Endocrine: Negative for polydipsia.  Genitourinary:  Negative for dysuria.  Musculoskeletal:  Negative for back pain.  Skin:  Negative for rash.  Neurological:  Negative for headaches.     Objective:  BP (!) 150/60   Pulse (!) 59   Temp 97.7 F (36.5 C)   Resp 15   Ht '5\' 10"'$  (1.778 m)   Wt 194 lb (88 kg)   SpO2 92%   BMI 27.84 kg/m      08/20/2022    9:50 AM 08/03/2022    1:03 PM 07/21/2022    2:56 PM  BP/Weight  Systolic BP [phone]  Diastolic BP 60 64 60  Wt. (Lbs) 194 192 189  BMI 27.84 kg/m2 27.55 kg/m2 29.6 kg/m2    Physical Exam  Diabetic Foot Exam - Simple   No data filed      Lab Results  Component Value Date   WBC 7.6 03/25/2022   HGB 13.8 03/25/2022   HCT 42 03/25/2022   PLT 277 03/25/2022   GLUCOSE 85 02/16/2022   CHOL 135 02/16/2022   TRIG 60 02/16/2022   HDL 50 02/16/2022   LDLCALC 72 02/16/2022   ALT 17 03/25/2022   AST 24 03/25/2022   NA 141 03/25/2022   K 3.7 03/25/2022   CL 103 03/25/2022   CREATININE 1.4 (A) 03/25/2022   BUN 27 (A) 03/25/2022   CO2 27 (A) 03/25/2022   TSH 1.260 01/23/2022   INR 1.03 01/09/2019    HGBA1C 5.6 12/01/2017      Assessment & Plan:   Problem List Items Addressed This Visit       Cardiovascular and Mediastinum   Hypertensive heart disease with chronic diastolic congestive heart failure ([place]) - Primary    Well controlled.  No changes to medicines. Amlodipine 5 mg twice a day, Carvedilol 25 mg twice a day Continue to work on eating a healthy diet and exercise.  Labs drawn today.         Respiratory   OSA treated with BiPAP (Chronic)    The current medical regimen is effective;  continue present plan and medications.        Musculoskeletal and Integument   Atopic neurodermatitis    The current medical regimen is effective;  continue present plan and medications. Taking Dupixent inject 2-4 weeks, Hydroxyzine 25 mg every 8 hours PRN.        Genitourinary   Malignant neoplasm of prostate ([place])    Management per specialist        Other   Mixed hyperlipidemia    Well controlled.  No changes to medicines. Pravastatin 20 mg daily. Continue to work on eating a healthy diet and exercise.  Labs drawn today.       GAD (generalized anxiety disorder)    The current medical regimen is effective;  continue present plan and medications. Buspar 15 mg twice a day, Paroxetine 40 mg daily.      Relevant Medications   busPIRone (BUSPAR) 15 MG tablet   Vitamin D deficiency    Check labs     .  No orders of the defined types were placed in this encounter.   No orders of the defined types were placed in this encounter.    Follow-up: No follow-ups on file.  An After Visit Summary was printed and given to the patient.  Rochel Brome, MD [facility] [phone]

## 2022-08-20 NOTE — Assessment & Plan Note (Signed)
The current medical regimen is effective;  continue present plan and medications. Buspar 15 mg twice a day, Paroxetine 40 mg daily.

## 2022-08-20 NOTE — Assessment & Plan Note (Signed)
The current medical regimen is effective;  continue present plan and medications. Taking Dupixent inject 2-4 weeks, Hydroxyzine 25 mg every 8 hours PRN.

## 2022-08-20 NOTE — Assessment & Plan Note (Signed)
Check labs 

## 2022-08-20 NOTE — Assessment & Plan Note (Addendum)
Well controlled.  No changes to medicines. Pravastatin 20 mg daily. Continue to work on eating a healthy diet and exercise.  Labs drawn today.  

## 2022-08-21 LAB — CARDIOVASCULAR RISK ASSESSMENT

## 2022-08-21 LAB — COMPREHENSIVE METABOLIC PANEL
ALT: 12 IU/L (ref 0–44)
AST: 19 IU/L (ref 0–40)
Albumin/Globulin Ratio: 2.1 (ref 1.2–2.2)
Albumin: 4.4 g/dL (ref 3.7–4.7)
Alkaline Phosphatase: 102 IU/L (ref 44–121)
BUN/Creatinine Ratio: 14 (ref 10–24)
BUN: 15 mg/dL (ref 8–27)
Bilirubin Total: 0.5 mg/dL (ref 0.0–1.2)
CO2: 22 mmol/L (ref 20–29)
Calcium: 9.6 mg/dL (ref 8.6–10.2)
Chloride: 104 mmol/L (ref 96–106)
Creatinine, Ser: 1.09 mg/dL (ref 0.76–1.27)
Globulin, Total: 2.1 g/dL (ref 1.5–4.5)
Glucose: 84 mg/dL (ref 70–99)
Potassium: 4.7 mmol/L (ref 3.5–5.2)
Sodium: 140 mmol/L (ref 134–144)
Total Protein: 6.5 g/dL (ref 6.0–8.5)
eGFR: 68 mL/min/{1.73_m2} (ref >59)

## 2022-08-21 LAB — CBC WITH DIFFERENTIAL/PLATELET
Basophils Absolute: 0 10*3/uL (ref 0.0–0.2)
Basos: 0 %
EOS (ABSOLUTE): 0.7 10*3/uL — ABNORMAL HIGH (ref 0.0–0.4)
Eos: 8 %
Hematocrit: 38.9 % (ref 37.5–51.0)
Hemoglobin: 12.9 g/dL — ABNORMAL LOW (ref 13.0–17.7)
Immature Grans (Abs): 0 10*3/uL (ref 0.0–0.1)
Immature Granulocytes: 0 %
Lymphocytes Absolute: 1.9 10*3/uL (ref 0.7–3.1)
Lymphs: 21 %
MCH: 30.1 pg (ref 26.6–33.0)
MCHC: 33.2 g/dL (ref 31.5–35.7)
MCV: 91 fL (ref 79–97)
Monocytes Absolute: 0.7 10*3/uL (ref 0.1–0.9)
Monocytes: 8 %
Neutrophils Absolute: 5.6 10*3/uL (ref 1.4–7.0)
Neutrophils: 63 %
Platelets: 292 10*3/uL (ref 150–450)
RBC: 4.28 x10E6/uL (ref 4.14–5.80)
RDW: 12.9 % (ref 11.6–15.4)
WBC: 9 10*3/uL (ref 3.4–10.8)

## 2022-08-21 LAB — LIPID PANEL
Chol/HDL Ratio: 2.1 ratio (ref 0.0–5.0)
Cholesterol, Total: 99 mg/dL — ABNORMAL LOW (ref 100–199)
HDL: 48 mg/dL (ref >39)
LDL Chol Calc (NIH): 37 mg/dL (ref 0–99)
Triglycerides: 62 mg/dL (ref 0–149)
VLDL Cholesterol Cal: 14 mg/dL (ref 5–40)

## 2022-08-21 LAB — VITAMIN D 25 HYDROXY (VIT D DEFICIENCY, FRACTURES): Vit D, 25-Hydroxy: 51.3 ng/mL (ref 30.0–100.0)

## 2022-08-23 ENCOUNTER — Encounter: Payer: Self-pay | Admitting: Family Medicine

## 2022-08-23 DIAGNOSIS — Z23 Encounter for immunization: Secondary | ICD-10-CM | POA: Insufficient documentation

## 2022-08-23 NOTE — Assessment & Plan Note (Signed)
Management per specialist. 

## 2022-08-23 NOTE — Assessment & Plan Note (Signed)
Continue eliquis because patient is at high risk of recurrent DVT/PE with prostate cancer, but I do decrease dose to 2.5 mg twice daily due to patient's age.

## 2022-08-24 ENCOUNTER — Other Ambulatory Visit: Payer: Self-pay

## 2022-08-24 DIAGNOSIS — L570 Actinic keratosis: Secondary | ICD-10-CM | POA: Diagnosis not present

## 2022-08-24 DIAGNOSIS — L299 Pruritus, unspecified: Secondary | ICD-10-CM | POA: Diagnosis not present

## 2022-08-24 DIAGNOSIS — L209 Atopic dermatitis, unspecified: Secondary | ICD-10-CM | POA: Diagnosis not present

## 2022-08-24 DIAGNOSIS — L814 Other melanin hyperpigmentation: Secondary | ICD-10-CM | POA: Diagnosis not present

## 2022-08-24 DIAGNOSIS — L820 Inflamed seborrheic keratosis: Secondary | ICD-10-CM | POA: Diagnosis not present

## 2022-08-24 DIAGNOSIS — L578 Other skin changes due to chronic exposure to nonionizing radiation: Secondary | ICD-10-CM | POA: Diagnosis not present

## 2022-08-24 MED ORDER — APIXABAN 2.5 MG PO TABS: 2.5000 mg | ORAL_TABLET | Freq: Two times a day (BID) | ORAL | 1 refills | Status: DC

## 2022-08-27 DIAGNOSIS — Z7901 Long term (current) use of anticoagulants: Secondary | ICD-10-CM | POA: Diagnosis not present

## 2022-08-27 DIAGNOSIS — I10 Essential (primary) hypertension: Secondary | ICD-10-CM | POA: Diagnosis not present

## 2022-08-27 DIAGNOSIS — Z955 Presence of coronary angioplasty implant and graft: Secondary | ICD-10-CM | POA: Diagnosis not present

## 2022-08-27 DIAGNOSIS — Z86718 Personal history of other venous thrombosis and embolism: Secondary | ICD-10-CM | POA: Diagnosis not present

## 2022-09-08 ENCOUNTER — Other Ambulatory Visit: Payer: Self-pay | Admitting: Family Medicine

## 2022-09-17 ENCOUNTER — Encounter: Payer: Self-pay | Admitting: Oncology

## 2022-09-17 NOTE — Addendum Note (Signed)
Addended by: Juanetta Beets on: 09/17/2022 02:22 PM   Modules accepted: Orders

## 2022-09-18 ENCOUNTER — Other Ambulatory Visit: Payer: Self-pay | Admitting: Family Medicine

## 2022-09-21 ENCOUNTER — Other Ambulatory Visit: Payer: Self-pay | Admitting: Family Medicine

## 2022-09-24 ENCOUNTER — Inpatient Hospital Stay: Payer: Medicare Other | Attending: Oncology

## 2022-09-24 ENCOUNTER — Other Ambulatory Visit: Payer: Self-pay | Admitting: Pharmacist

## 2022-09-24 DIAGNOSIS — C774 Secondary and unspecified malignant neoplasm of inguinal and lower limb lymph nodes: Secondary | ICD-10-CM | POA: Diagnosis not present

## 2022-09-24 DIAGNOSIS — C61 Malignant neoplasm of prostate: Secondary | ICD-10-CM | POA: Diagnosis not present

## 2022-09-24 DIAGNOSIS — R5383 Other fatigue: Secondary | ICD-10-CM | POA: Diagnosis not present

## 2022-09-24 DIAGNOSIS — Z5111 Encounter for antineoplastic chemotherapy: Secondary | ICD-10-CM | POA: Diagnosis not present

## 2022-09-24 DIAGNOSIS — D649 Anemia, unspecified: Secondary | ICD-10-CM | POA: Diagnosis not present

## 2022-09-24 LAB — CBC AND DIFFERENTIAL
HCT: 40 — AB (ref 41–53)
Hemoglobin: 13.8 (ref 13.5–17.5)
Neutrophils Absolute: 5.37
Platelets: 283 10*3/uL (ref 150–400)
WBC: 9.1

## 2022-09-24 LAB — BASIC METABOLIC PANEL
BUN: 30 — AB (ref 4–21)
CO2: 28 — AB (ref 13–22)
Chloride: 102 (ref 99–108)
Creatinine: 1.3 (ref 0.6–1.3)
Glucose: 107
Potassium: 4 mEq/L (ref 3.5–5.1)
Sodium: 137 (ref 137–147)

## 2022-09-24 LAB — HEPATIC FUNCTION PANEL
ALT: 19 U/L (ref 10–40)
AST: 28 (ref 14–40)
Alkaline Phosphatase: 99 (ref 25–125)
Bilirubin, Total: 0.8

## 2022-09-24 LAB — COMPREHENSIVE METABOLIC PANEL
Albumin: 4.5 (ref 3.5–5.0)
Calcium: 9.6 (ref 8.7–10.7)

## 2022-09-24 LAB — PSA: Prostatic Specific Antigen: 0.1 ng/mL (ref 0.00–4.00)

## 2022-09-24 LAB — CBC: RBC: 4.56 (ref 3.87–5.11)

## 2022-09-24 NOTE — Progress Notes (Signed)
[facility]  [address] [place],  [place]  [address] [phone]  Clinic Day:  09/25/2022  Referring physician: Rochel Brome, MD   HISTORY OF PRESENT ILLNESS:  The patient is an 81 y.o. 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  He is currently on complete androgen deprivation therapy, which consists of Lupron/darolutamide.  As it pertains to his prostate cancer management, he denies having any side effects from his complete androgen blockade therapy.  He also denies having any new symptoms or findings that concern him for progression his metastatic prostate cancer.  The only issue he complains of is fatigue.  PHYSICAL EXAM:  Blood pressure (!) 111/59, pulse 61, temperature 98.8 F (37.1 C), resp. rate 16, height '5\' 10"'$  (1.778 m), weight 193 lb 12.8 oz (87.9 kg), SpO2 94 %. Wt Readings from Last 3 Encounters:  09/25/22 194 lb 6.4 oz (88.2 kg)  09/25/22 193 lb 12.8 oz (87.9 kg)  08/20/22 194 lb (88 kg)   Body mass index is 27.81 kg/m. Performance status (ECOG): 1 Physical Exam Constitutional:      General: He is not in acute distress.    Appearance: Normal appearance. He is normal weight.  HENT:     Head: Normocephalic and atraumatic.  Eyes:     General: No scleral icterus.    Extraocular Movements: Extraocular movements intact.     Conjunctiva/sclera: Conjunctivae normal.     Pupils: Pupils are equal, round, and reactive to light.  Cardiovascular:     Rate and Rhythm: Normal rate and regular rhythm.     Pulses: Normal pulses.     Heart sounds: Normal heart sounds. No murmur heard.    No friction rub. No gallop.  Pulmonary:     Effort: Pulmonary effort is normal. No respiratory distress.     Breath sounds: Normal breath sounds.  Abdominal:     General: Bowel sounds are normal. There is no distension.     Palpations: Abdomen is soft. There is no  hepatomegaly, splenomegaly or mass.     Tenderness: There is no abdominal tenderness.  Musculoskeletal:        General: Normal range of motion.     Cervical back: Normal range of motion and neck supple.     Right lower leg: No edema.     Left lower leg: No edema.  Lymphadenopathy:     Cervical: No cervical adenopathy.  Skin:    General: Skin is warm and dry.  Neurological:     General: No focal deficit present.     Mental Status: He is alert and oriented to person, place, and time. Mental status is at baseline.  Psychiatric:        Mood and Affect: Mood normal.        Behavior: Behavior normal.        Thought Content: Thought content normal.        Judgment: Judgment normal.    LABS:    ASSESSMENT & PLAN:  Assessment/Plan:  An 81 y.o. male with metastatic prostate cancer.  His PSA level of .10, although still very low, suggests he is no longer undetectable, which is when the level is < 0.1.  Based upon this, I told the patient to increase his darolutamide to 300 mg daily, with hope this, in conjunction with his Lupron, we will get him back to being undetectable.  Otherwise, the patient appears  to be doing well.  I will see him back in 3 months for repeat clinical assessment. The patient understands all the plans discussed today and is in agreement with them.    Eldena Dede Macarthur Critchley, MD

## 2022-09-25 ENCOUNTER — Inpatient Hospital Stay (INDEPENDENT_AMBULATORY_CARE_PROVIDER_SITE_OTHER): Payer: Medicare Other | Admitting: Oncology

## 2022-09-25 ENCOUNTER — Ambulatory Visit: Payer: Medicare Other | Admitting: Oncology

## 2022-09-25 ENCOUNTER — Other Ambulatory Visit: Payer: Self-pay | Admitting: Oncology

## 2022-09-25 ENCOUNTER — Other Ambulatory Visit (HOSPITAL_COMMUNITY): Payer: Self-pay

## 2022-09-25 ENCOUNTER — Inpatient Hospital Stay: Payer: Medicare Other

## 2022-09-25 VITALS — BP 116/74 | HR 56 | Temp 97.6°F | Resp 16 | Ht 70.0 in | Wt 194.4 lb

## 2022-09-25 VITALS — BP 111/59 | HR 61 | Temp 98.8°F | Resp 16 | Ht 70.0 in | Wt 193.8 lb

## 2022-09-25 DIAGNOSIS — R5383 Other fatigue: Secondary | ICD-10-CM | POA: Diagnosis not present

## 2022-09-25 DIAGNOSIS — C61 Malignant neoplasm of prostate: Secondary | ICD-10-CM | POA: Diagnosis not present

## 2022-09-25 DIAGNOSIS — Z5111 Encounter for antineoplastic chemotherapy: Secondary | ICD-10-CM | POA: Diagnosis not present

## 2022-09-25 DIAGNOSIS — C774 Secondary and unspecified malignant neoplasm of inguinal and lower limb lymph nodes: Secondary | ICD-10-CM | POA: Diagnosis not present

## 2022-09-25 MED ORDER — DAROLUTAMIDE 300 MG PO TABS
300.0000 mg | ORAL_TABLET | Freq: Every day | ORAL | 3 refills | Status: DC
  Filled 2022-09-25: qty 120, 120d supply, fill #0

## 2022-09-25 MED ORDER — LEUPROLIDE ACETATE (3 MONTH) 22.5 MG IM KIT
22.5000 mg | PACK | Freq: Once | INTRAMUSCULAR | Status: AC
  Administered 2022-09-25: 22.5 mg via INTRAMUSCULAR
  Filled 2022-09-25: qty 22.5

## 2022-09-25 NOTE — Patient Instructions (Signed)
Leuprolide Suspension for Injection (Prostate Cancer) What is this medication? LEUPROLIDE (loo PROE lide) reduces the symptoms of prostate cancer. It works by decreasing levels of the hormone testosterone in the body. This prevents prostate cancer cells from spreading or growing. This medicine may be used for other purposes; ask your health care provider or pharmacist if you have questions. COMMON BRAND NAME(S): Eligard, Fensolvi, Lupron Depot, Lupron Depot-Ped, Lutrate Depot, Viadur What should I tell my care team before I take this medication? They need to know if you have any of these conditions: Diabetes Heart disease Heart failure High or low levels of electrolytes, such as magnesium, potassium, or sodium in your blood Irregular heartbeat or rhythm Seizures An unusual or allergic reaction to leuprolide, other medications, foods, dyes, or preservatives Pregnant or trying to get pregnant Breast-feeding How should I use this medication? This medication is injected under the skin or into a muscle. It is given by your care team in a hospital or clinic setting. Talk to your care team about the use of this medication in children. Special care may be needed. Overdosage: If you think you have taken too much of this medicine contact a poison control center or emergency room at once. NOTE: This medicine is only for you. Do not share this medicine with others. What if I miss a dose? Keep appointments for follow-up doses. It is important not to miss your dose. Call your care team if you are unable to keep an appointment. What may interact with this medication? Do not take this medication with any of the following: Cisapride Dronedarone Ketoconazole Levoketoconazole Pimozide Thioridazine This medication may also interact with the following: Other medications that cause heart rhythm changes This list may not describe all possible interactions. Give your health care provider a list of all the  medicines, herbs, non-prescription drugs, or dietary supplements you use. Also tell them if you smoke, drink alcohol, or use illegal drugs. Some items may interact with your medicine. What should I watch for while using this medication? Visit your care team for regular checks on your progress. Tell your care team if your symptoms do not start to get better or if they get worse. This medication may increase blood sugar. The risk may be higher in patients who already have diabetes. Ask your care team what you can do to lower the risk of diabetes while taking this medication. This medication may cause infertility. Talk to your care team if you are concerned about your fertility. Heart attacks and strokes have been reported with the use of this medication. Get emergency help if you develop signs or symptoms of a heart attack or stroke. Talk to your care team about the risks and benefits of this medication. What side effects may I notice from receiving this medication? Side effects that you should report to your care team as soon as possible: Allergic reactions--skin rash, itching, hives, swelling of the face, lips, tongue, or throat Heart attack--pain or tightness in the chest, shoulders, arms, or jaw, nausea, shortness of breath, cold or clammy skin, feeling faint or lightheaded Heart rhythm changes--fast or irregular heartbeat, dizziness, feeling faint or lightheaded, chest pain, trouble breathing High blood sugar (hyperglycemia)--increased thirst or amount of urine, unusual weakness or fatigue, blurry vision Mood swings, irritability, hostility Seizures Stroke--sudden numbness or weakness of the face, arm, or leg, trouble speaking, confusion, trouble walking, loss of balance or coordination, dizziness, severe headache, change in vision Thoughts of suicide or self-harm, worsening mood, feelings of depression  Side effects that usually do not require medical attention (report to your care team if they  continue or are bothersome): Bone pain Change in sex drive or performance General discomfort and fatigue Hot flashes Muscle pain Pain, redness, or irritation at injection site Swelling of the ankles, hands, or feet This list may not describe all possible side effects. Call your doctor for medical advice about side effects. You may report side effects to FDA at 1-800-FDA-1088. Where should I keep my medication? This medication is given in a hospital or clinic. It will not be stored at home. NOTE: This sheet is a summary. It may not cover all possible information. If you have questions about this medicine, talk to your doctor, pharmacist, or health care provider.  2023 Elsevier/Gold Standard (2022-02-09 00:00:00)

## 2022-09-29 ENCOUNTER — Encounter: Payer: Self-pay | Admitting: Oncology

## 2022-09-29 ENCOUNTER — Telehealth: Payer: Self-pay

## 2022-09-29 DIAGNOSIS — R5381 Other malaise: Secondary | ICD-10-CM | POA: Diagnosis not present

## 2022-09-29 DIAGNOSIS — F411 Generalized anxiety disorder: Secondary | ICD-10-CM | POA: Diagnosis not present

## 2022-09-29 DIAGNOSIS — R5383 Other fatigue: Secondary | ICD-10-CM | POA: Diagnosis not present

## 2022-09-29 DIAGNOSIS — Z955 Presence of coronary angioplasty implant and graft: Secondary | ICD-10-CM | POA: Diagnosis not present

## 2022-09-29 DIAGNOSIS — Z7901 Long term (current) use of anticoagulants: Secondary | ICD-10-CM | POA: Diagnosis not present

## 2022-09-29 DIAGNOSIS — I10 Essential (primary) hypertension: Secondary | ICD-10-CM | POA: Diagnosis not present

## 2022-09-29 NOTE — Telephone Encounter (Signed)
Dr Bobby Rumpf received call from pt's cardiologist today, regarding pt's c/o fatigue. Dr Bobby Rumpf asked me to call pt to see if he thinks the fatigue has increased since taking the Daralutamide? Pt was initially started on this in 2021. He did have brief time medication was held due to skin rash. However, pt has been taking the medication, as ordered, since February 24, 2021.

## 2022-09-30 ENCOUNTER — Encounter: Payer: Self-pay | Admitting: Oncology

## 2022-09-30 ENCOUNTER — Telehealth: Payer: Self-pay

## 2022-09-30 NOTE — Telephone Encounter (Signed)
Opened in error

## 2022-10-06 ENCOUNTER — Other Ambulatory Visit: Payer: Self-pay

## 2022-10-06 ENCOUNTER — Other Ambulatory Visit: Payer: Self-pay | Admitting: Family Medicine

## 2022-10-06 MED ORDER — CARVEDILOL 25 MG PO TABS: ORAL_TABLET | ORAL | 0 refills | Status: DC

## 2022-10-06 MED ORDER — LISINOPRIL 20 MG PO TABS: 20.0000 mg | ORAL_TABLET | Freq: Two times a day (BID) | ORAL | 0 refills | Status: DC

## 2022-10-06 MED ORDER — FUROSEMIDE 20 MG PO TABS: 20.0000 mg | ORAL_TABLET | Freq: Every day | ORAL | 0 refills | Status: DC

## 2022-10-07 ENCOUNTER — Telehealth: Payer: Self-pay

## 2022-10-07 NOTE — Telephone Encounter (Signed)
I spoke with [place], [facility]. He wanted to confirm the prescription dosage, as well as the quantity and refills. Pt's dosage has been increased to Nubeqa '300mg'$  po qd. Per Dr Bobby Rumpf, the prescription Qty 30 tabs 3 refills. [place], read back and verified above, on speaker phone in the presence of [place] for oral chemo.

## 2022-10-08 ENCOUNTER — Other Ambulatory Visit: Payer: Self-pay | Admitting: Family Medicine

## 2022-10-08 DIAGNOSIS — F411 Generalized anxiety disorder: Secondary | ICD-10-CM

## 2022-10-08 MED ORDER — BUSPIRONE HCL 15 MG PO TABS: 15.0000 mg | ORAL_TABLET | Freq: Two times a day (BID) | ORAL | 0 refills | Status: DC

## 2022-10-15 ENCOUNTER — Other Ambulatory Visit: Payer: Self-pay

## 2022-11-04 NOTE — Progress Notes (Signed)
Sent in renewal applications for Eliquis to [employer] and for Nubeqa to [employer].

## 2022-11-05 ENCOUNTER — Other Ambulatory Visit: Payer: Self-pay | Admitting: Family Medicine

## 2022-11-26 ENCOUNTER — Other Ambulatory Visit: Payer: Self-pay

## 2022-11-26 ENCOUNTER — Ambulatory Visit (INDEPENDENT_AMBULATORY_CARE_PROVIDER_SITE_OTHER): Payer: Medicare Other

## 2022-11-26 DIAGNOSIS — E782 Mixed hyperlipidemia: Secondary | ICD-10-CM

## 2022-11-26 DIAGNOSIS — I110 Hypertensive heart disease with heart failure: Secondary | ICD-10-CM

## 2022-11-26 MED ORDER — ATORVASTATIN CALCIUM 40 MG PO TABS: 40.0000 mg | ORAL_TABLET | Freq: Every day | ORAL | 0 refills | Status: DC

## 2022-11-26 NOTE — Patient Instructions (Signed)
Visit Information   Goals Addressed   None    Patient Care Plan: CCM Pharmacy Care Plan     Problem Identified: Cholesterol, Cancer, HTN   Priority: High  Onset Date: 05/28/2022     Long-Range Goal: Disease State Management   Start Date: 05/28/2022  Expected End Date: 05/28/2023  Recent Progress: On track  Priority: High  Note:   Current Barriers:  Does not contact provider office for questions/concerns  Pharmacist Clinical Goal(s):  Patient will contact provider office for questions/concerns as evidenced notation of same in electronic health record through collaboration with PharmD and provider.   Interventions: 1:1 collaboration with Rochel Brome, MD regarding development and update of comprehensive plan of care as evidenced by provider attestation and co-signature Inter-disciplinary care team collaboration (see longitudinal plan of care) Comprehensive medication review performed; medication list updated in electronic medical record  Hypertension (BP goal <130/80) BP Readings from Last 3 Encounters:  03/26/22 109/66  03/26/22 (!) 112/57  02/16/22 (!) 118/58  -Controlled -Current treatment: Amlodipine '10mg'$  QD Appropriate, Effective, Safe, Accessible Carvedilol '25mg'$  BID Appropriate, Effective, Safe, Accessible Furosemide '20mg'$  take 2QD (Patient taking 1QD) Query Appropriate Lisinopril '20mg'$  BID Appropriate, Effective, Safe, Accessible -Medications previously tried: N/A -Current home readings:  June 2023: BP ranges from 110-160's. Most likely because patient taking Hydroxyzine BID. It is severely lowering him, then leaving the body (Half life is 3-7 hours) so his BP increases, then he takes it again and his BP drops -Current dietary habits: "Tries to eat healthy" -Current exercise habits: N/A -Denies hypotensive/hypertensive symptoms -Educated on BP goals and benefits of medications for prevention of heart attack, stroke and kidney damage; -Counseled to monitor BP at home  daily, document, and provide log at future appointments June 2023: Patient non-compliant on meds. Furosemide '20mg'$  take 2QD (Patient taking 1QD), taking Lisinopril but Olmesartan is written December 2023: Patient states he is taking Amlodpine '10mg'$  QD per Cardio. Updated medslist  Hyperlipidemia: (LDL goal < 100) Lab Results  Component Value Date   CHOL 135 02/16/2022   CHOL 131 07/31/2021   CHOL 178 03/07/2021   Lab Results  Component Value Date   HDL 50 02/16/2022   HDL 51 07/31/2021   HDL 54 03/07/2021   Lab Results  Component Value Date   LDLCALC 72 02/16/2022   LDLCALC 64 07/31/2021   LDLCALC 106 (H) 03/07/2021   Lab Results  Component Value Date   TRIG 60 02/16/2022   TRIG 79 07/31/2021   TRIG 102 03/07/2021   Lab Results  Component Value Date   CHOLHDL 2.7 02/16/2022   CHOLHDL 2.6 07/31/2021   CHOLHDL 3.3 03/07/2021  No results found for: "LDLDIRECT" -Controlled -Current treatment: Atorvastatin Appropriate, Effective, Safe, Accessible -Medications previously tried: Pravastatin  -Current dietary patterns: "Tries to eat healthy" -Current exercise habits: N/A -Educated on Cholesterol goals;  December 2023: Patient taking both Atorvastatin and Pravastatin. Told patient to only take Atorvastatin per Cardio medslist. 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  -Not ideally controlled -Current treatment  Eliquis '5mg'$  BID Appropriate, Effective, Safe, Accessible 81 y.o. Lab Results  Component Value Date   CREATININE 1.3 09/24/2022   Wt Readings from Last 3 Encounters:  09/25/22 194 lb 6.4 oz (88.2 kg)  09/25/22 193 lb 12.8 oz (87.9 kg)  08/20/22 194 lb (88 kg)   ASA '81mg'$  (NOT TAKING) Query appropriate  Clopidogrel '75mg'$  (NOT TAKing) Query Appropriate December 2023: Patient is taking Eliquis and Eliquis only. Cardio note states patient should be  on Triple (1 month)->DAPT. Patient only taking Eliquis, however Clopidogrel has extensive fill Hx. Will ask Cardio how to proceed  Depression/Anxiety (Goal: decrease symptoms) -Controlled -Current treatment: Buspirone '15mg'$  BID (Patient taking '30mg'$ AM-'30mg'$ PM) Query Appropriate, Effective, Safe, Accessible Paroxetine '40mg'$  Appropriate, Effective, Safe, Accessible Hydroxyzine '25mg'$  TID PRN (Patient taking BID Always) Appropriate, Effective, Safe, Accessible -Medications previously tried/failed: N/A -PHQ9:     02/04/2022   10:38 AM 09/24/2021    9:55 AM 11/27/2020    2:09 PM  Depression screen PHQ 2/9  Decreased Interest 0 0 0  Down, Depressed, Hopeless 0 0 0  PHQ - 2 Score 0 0 0  -GAD7:      No data to display         -Educated on Benefits of medication for symptom control June 2023: Will ask PCP about updating script December 2023: EPIC medslist says '15mg'$  BID but patient is taking '30mg'$  BID. Will ask PCP how to proceed   Vit D Deficiency (Goal: 30-100) Last vitamin D Lab Results  Component Value Date   VD25OH 24.2 (L) 04/27/2022  -Not ideally controlled -Current treatment  Ergocalciferol 1.'25mg'$  2x/week -Medications previously tried: N/A  June 2023: Patient already unsteady on his feet, high dose Vit D increases fall risk. Will ask PCP about potentially using daily VitD since he's not severely low   Prostate Cancer -Controlled -Current treatment  Darolutamide '300mg'$  Appropriate, Effective, Safe, Accessible Duilumab Appropriate, Effective, Safe, Accessible Leuprolide Appropriate, Effective, Safe, Accessible -Medications previously tried:   -Recommended to continue current medication  Patient Goals/Self-Care Activities Patient will:  - take medications as prescribed as evidenced by patient report and record review  Follow Up Plan: The patient has been provided with contact information for the care management team and has been advised to call with any health related  questions or concerns.   CPP F/U June 2024  [place] Constable, Sherian Rein.D. - [phone]       Mr. Ahlgren was given information about Chronic Care Management services today including:  CCM service includes personalized support from designated clinical staff supervised by his physician, including individualized plan of care and coordination with other care providers 24/7 contact phone numbers for assistance for urgent and routine care needs. Standard insurance, coinsurance, copays and deductibles apply for chronic care management only during months in which we provide at least 20 minutes of these services. Most insurances cover these services at 100%, however patients may be responsible for any copay, coinsurance and/or deductible if applicable. This service may help you avoid the need for more expensive face-to-face services. Only one practitioner may furnish and bill the service in a calendar month. The patient may stop CCM services at any time (effective at the end of the month) by phone call to the office staff.  Patient agreed to services and verbal consent obtained.   The patient verbalized understanding of instructions, educational materials, and care plan provided today and DECLINED offer to receive copy of patient instructions, educational materials, and care plan.  The pharmacy team will reach out to the patient again over the next 60 days.   Lane Hacker, [place]

## 2022-11-26 NOTE — Progress Notes (Signed)
Chronic Care Management Pharmacy Note  11/26/2022 Name:  Marcus King MRN:  [id number] DOB:  03-24-1941  Summary: -Pleasant 81 year old male presents for f/u CCM visit. 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 Has 2 children, 1 in [place] and 1 in [facility]  Recommendations/Changes made from today's visit: -There are big gaps in patients EPIC Medslist vs Cardio Medslist vs what patient is actually taking. Routed msg to PCP [facility] regarding this. Also, will have CCM team call Cardio to verify    Subjective: Marcus King is an 81 y.o. year old male who is a primary patient of Cox, Kirsten, MD.  The CCM team was consulted for assistance with disease management and care coordination needs.    Engaged with patient by telephone for follow up visit in response to provider referral for pharmacy case management and/or care coordination services.   Consent to Services:  The patient was given the following information about Chronic Care Management services today, agreed to services, and gave verbal consent: 1. CCM service includes personalized support from designated clinical staff supervised by the primary care provider, including individualized plan of care and coordination with other care providers 2. 24/7 contact phone numbers for assistance for urgent and routine care needs. 3. Service will only be billed when office clinical staff spend 20 minutes or more in a month to coordinate care. 4. Only one practitioner may furnish and bill the service in a calendar month. 5.The patient may stop CCM services at any time (effective at the end of the month) by phone call to the office staff. 6. The patient will be responsible for cost sharing (co-pay) of up to 20% of the service fee (after annual deductible is met). Patient agreed to services and consent obtained.  Patient Care Team: Rochel Brome, MD as PCP - General (Family Medicine) Marice Potter, MD  as PCP - Hematology/Oncology (Oncology) Jolene Schimke, MD as Referring Physician (Dermatology) Edrick Oh, MD as Consulting Physician (Nephrology) Flossie Buffy., MD as Referring Physician (Cardiology) Lane Hacker, [facility] (Pharmacist)     Objective:  Lab Results  Component Value Date   CREATININE 1.3 09/24/2022   BUN 30 (A) 09/24/2022   EGFR 68 08/20/2022   GFRNONAA 67 01/07/2021   GFRAA 77 01/07/2021   NA 137 09/24/2022   K 4.0 09/24/2022   CALCIUM 9.6 09/24/2022   CO2 28 (A) 09/24/2022   GLUCOSE 84 08/20/2022    Lab Results  Component Value Date/Time   HGBA1C 5.6 12/01/2017 12:49 PM    Last diabetic Eye exam: No results found for: "HMDIABEYEEXA"  Last diabetic Foot exam: No results found for: "HMDIABFOOTEX"   Lab Results  Component Value Date   CHOL 99 (L) 08/20/2022   HDL 48 08/20/2022   LDLCALC 37 08/20/2022   TRIG 62 08/20/2022   CHOLHDL 2.1 08/20/2022       Latest Ref Rng & Units 09/24/2022   12:00 AM 08/20/2022   10:29 AM 03/25/2022   12:00 AM  Hepatic Function  Total Protein 6.0 - 8.5 g/dL  6.5    Albumin 3.5 - 5.0 4.5     4.4  4.2      AST 14 - 40 _0 ALT 10 - 40 U/L _1 Alk Phosphatase 25 - 125 99  102  84      Total Bilirubin 0.0 - 1.2 mg/dL  0.5       This result is from an external source.    Lab Results  Component Value Date/Time   TSH 1.260 01/23/2022 12:32 PM   TSH 1.300 01/07/2021 04:49 PM       Latest Ref Rng & Units 09/24/2022   12:00 AM 08/20/2022   10:29 AM 03/25/2022   12:00 AM  CBC  WBC  9.1     9.0  7.6      Hemoglobin 13.5 - 17.5 13.8     12.9  13.8      Hematocrit 41 - 53 40     38.9  42      Platelets 150 - 400 K/uL 283     292  277         This result is from an external source.    Lab Results  Component Value Date/Time   VD25OH 51.3 08/20/2022 10:29 AM   VD25OH 24.2 (L) 04/27/2022 11:24 AM    Clinical ASCVD: No  The ASCVD Risk score (Arnett DK, et al., 2019) failed to  calculate for the following reasons:   The 2019 ASCVD risk score is only valid for ages 46 to 48       07/21/2022    3:04 PM 02/04/2022   10:38 AM 09/24/2021    9:55 AM  Depression screen PHQ 2/9  Decreased Interest 0 0 0  Down, Depressed, Hopeless 0 0 0  PHQ - 2 Score 0 0 0     Other: (CHADS2VASc if Afib, MMRC or CAT for COPD, ACT, DEXA)  Social History   Tobacco Use  Smoking Status Former   Years: 20.00   Types: Cigarettes   Quit date: 1979   Years since quitting: 44.9  Smokeless Tobacco Never   BP Readings from Last 3 Encounters:  09/25/22 116/74  09/25/22 (!) 111/59  08/20/22 118/60   Pulse Readings from Last 3 Encounters:  09/25/22 (!) 56  09/25/22 61  08/20/22 (!) 59   Wt Readings from Last 3 Encounters:  09/25/22 194 lb 6.4 oz (88.2 kg)  09/25/22 193 lb 12.8 oz (87.9 kg)  08/20/22 194 lb (88 kg)   BMI Readings from Last 3 Encounters:  09/25/22 27.89 kg/m  09/25/22 27.81 kg/m  08/20/22 27.84 kg/m    Assessment/Interventions: Review of patient past medical history, allergies, medications, health status, including review of consultants reports, laboratory and other test data, was performed as part of comprehensive evaluation and provision of chronic care management services.   SDOH:  (Social Determinants of Health) assessments and interventions performed: Yes SDOH Interventions    Flowsheet Row Chronic Care Management from 11/26/2022 in [place] Management from 05/28/2022 in [place] from 02/04/2022 in [place] Interventions     Food Insecurity Interventions -- -- Intervention Not Indicated  Housing Interventions -- -- Intervention Not Indicated  Transportation Interventions Intervention Not Indicated -- Intervention Not Indicated  Financial Strain Interventions Intervention Not Indicated Other (Comment)  [Unable to apply PAP] Other (Comment)  [patient assistance for unaffordable  medication]  Physical Activity Interventions -- Patient Refused --  Social Connections Interventions -- -- Intervention Not Indicated      SDOH Screenings   Food Insecurity: No Food Insecurity (02/04/2022)  Housing: Low Risk  (05/08/2020)  Transportation Needs: No Transportation Needs (11/26/2022)  Alcohol Screen: Low Risk  (02/04/2022)  Depression (  PHQ2-9): Low Risk  (07/21/2022)  Financial Resource Strain: Low Risk  (11/26/2022)  Physical Activity: Insufficiently Active (05/28/2022)  Social Connections: Socially Integrated (02/04/2022)  Tobacco Use: Medium Risk (09/25/2022)    CCM Care Plan  Allergies  Allergen Reactions   Beef-Derived Products Diarrhea   Clonazepam Other (See Comments)    High doses cause drunk feeling (>1 mg)   Monosodium Glutamate Other (See Comments)    MSG (soups and oriental foods)   Pegademase Bovine Diarrhea    Medications Reviewed Today     Reviewed by Quentin Mulling, RN (Registered Nurse) on 09/25/22 at 1257  Med List Status: <None>   Medication Order Taking? Sig Documenting Provider Last Dose Status Informant  amLODipine (NORVASC) 5 MG tablet [id number] No Take 1 tablet (5 mg total) by mouth in the morning and at bedtime. Cox, Kirsten, MD Taking Active   apixaban (ELIQUIS) 2.5 MG TABS tablet [id number]  Take 1 tablet (2.5 mg total) by mouth 2 (two) times daily. Cox, Kirsten, MD  Active   busPIRone (BUSPAR) 15 MG tablet [id number] No Take by mouth. [provider] Taking Active   carvedilol (COREG) 25 MG tablet [id number] No take 1 tablet (25 mg) by oral route 2 times per day with food Cox, Kirsten, MD Taking Active   clobetasol cream (TEMOVATE) 0.05 % [id number] No Apply topically 2 (two) times daily as needed. [provider] Taking Active   darolutamide (NUBEQA) 300 MG tablet [id number]  Take 1 tablet (300 mg total) by mouth daily. THIS MEDICATION WILL NOW BE GIVEN DAILY Bobby Rumpf, Dequincy A, MD  Active   Dupilumab 100 MG/0.67ML SOSY  [id number] No Inject into the skin. [provider] Taking Active   hydrOXYzine (ATARAX) 25 MG tablet [id number] No Take 1 tablet (25 mg total) by mouth every 8 (eight) hours as needed. CoxElnita Maxwell, MD Taking Active   Leuprolide Acetate, 3 Month, Okey Dupre, IM) [id number] No Inject into the muscle. [provider] Taking Active   loratadine (CLARITIN) 10 MG tablet [id number] No Take by mouth. [provider] Taking Active   mupirocin ointment (BACTROBAN) 2 % [id number] No Apply topically 3 (three) times daily. [provider] Taking Active   PARoxetine (PAXIL) 40 MG tablet [id number] No Take 1 tablet (40 mg total) by mouth daily. Cox, Kirsten, MD Taking Active   pravastatin (PRAVACHOL) 20 MG tablet [id number]  Take 1 tablet (20 mg total) by mouth daily. Cox, Kirsten, MD  Active   Vitamin D, Ergocalciferol, (DRISDOL) 1.25 MG (50000 UNIT) CAPS capsule [id number] No Take 1 capsule (50,000 Units total) by mouth 2 (two) times a week. 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   Neck muscle spasm 07/21/2022   Nonspecific ST-T wave electrocardiographic changes 07/02/2022   Imbalance 02/16/2022   Paresthesia 01/23/2022   Memory loss 01/23/2022   Vitamin D deficiency 01/23/2022   Dermatitis 11/25/2021   Hypertensive heart disease with chronic diastolic congestive heart failure ([place]) 11/25/2021   Mixed hyperlipidemia    GAD (generalized anxiety disorder)    Cervical disc disorder with myelopathy, cervicothoracic region    Malignant neoplasm of prostate ([place]) 09/09/2020   Personal history of pulmonary embolism 06/13/2020   Solid nodule of lung greater than 8 mm in diameter 05/26/2020   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   Congestive heart failure with LV  diastolic dysfunction, NYHA class 1 ([place]) 09/21/2018   PVC's (premature ventricular contractions) 05/18/2018   LVH (left ventricular hypertrophy) due to hypertensive disease 08/11/2017   OSA treated with BiPAP    S/P TKR (total knee replacement) using cement, 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    Immunization History  Administered Date(s) Administered   Fluad Quad(high Dose 65+) 09/25/2020, 09/24/2021, 08/20/2022   Influenza-Unspecified 09/07/2016, 09/13/2018   Janssen (J&J) SARS-COV-2 Vaccination 03/12/2020   Moderna Covid-19 Vaccine Bivalent Booster 43yr & up 09/24/2021   Pneumococcal Conjugate-13 11/11/2015   Pneumococcal Polysaccharide-23 12/11/2009, 12/26/2012   Td 12/11/2009   Tdap 02/16/2022   Zoster Recombinat (Shingrix) 09/23/2018, 11/29/2018    Conditions to be addressed/monitored:  Hypertension and Hyperlipidemia  Care Plan : [place] Updates made by KLane Hacker RWallacesince 11/26/2022 12:00 AM     Problem: Cholesterol, Cancer, HTN   Priority: High  Onset Date: 05/28/2022     Long-Range Goal: Disease State Management   Start Date: 05/28/2022  Expected End Date: 05/28/2023  Recent Progress: On track  Priority: High  Note:   Current Barriers:  Does not contact provider office for questions/concerns  Pharmacist Clinical Goal(s):  Patient will contact provider office for questions/concerns as evidenced notation of same in electronic health record through collaboration with PharmD and provider.   Interventions: 1:1 collaboration with CRochel Brome MD regarding development and update of comprehensive plan of care as evidenced by provider attestation and co-signature Inter-disciplinary care team collaboration (see longitudinal plan of care) Comprehensive medication review performed; medication list updated in electronic medical record  Hypertension (BP goal <130/80) BP Readings from Last 3 Encounters:  03/26/22 109/66  03/26/22 (!) 112/57  02/16/22 (!) 118/58   -Controlled -Current treatment: Amlodipine 155mQD Appropriate, Effective, Safe, Accessible Carvedilol 2546mID Appropriate, Effective, Safe, Accessible Furosemide 20m78mke 2QD (Patient taking 1QD) Query Appropriate Lisinopril 20mg85m Appropriate, Effective, Safe, Accessible -Medications previously tried: N/A -Current home readings:  June 2023: BP ranges from 110-160's. Most likely because patient taking Hydroxyzine BID. It is severely lowering him, then leaving the body (Half life is 3-7 hours) so his BP increases, then he takes it again and his BP drops -Current dietary habits: "Tries to eat healthy" -Current exercise habits: N/A -Denies hypotensive/hypertensive symptoms -Educated on BP goals and benefits of medications for prevention of heart attack, stroke and kidney damage; -Counseled to monitor BP at home daily, document, and provide log at future appointments June 2023: Patient non-compliant on meds. Furosemide 20mg 16m 2QD (Patient taking 1QD), taking Lisinopril but Olmesartan is written December 2023: Patient states he is taking Amlodpine 10mg Q7mr Cardio. Updated medslist  Hyperlipidemia: (LDL goal < 100) Lab Results  Component Value Date   CHOL 135 02/16/2022   CHOL 131 07/31/2021   CHOL 178 03/07/2021   Lab Results  Component Value Date   HDL 50 02/16/2022   HDL 51 07/31/2021   HDL 54 03/07/2021   Lab Results  Component Value Date   LDLCALC 72 02/16/2022   LDLCALC 64 07/31/2021   LDLCALC 106 (H) 03/07/2021   Lab Results  Component Value Date   TRIG 60 02/16/2022   TRIG 79 07/31/2021   TRIG 102 03/07/2021   Lab Results  Component Value Date   CHOLHDL 2.7 02/16/2022   CHOLHDL 2.6 07/31/2021   CHOLHDL 3.3 03/07/2021  No results found for: "LDLDIRECT" -Controlled -Current treatment: Atorvastatin Appropriate, Effective, Safe, Accessible -  Medications previously tried: Pravastatin  -Current dietary patterns: "Tries to eat healthy" -Current exercise  habits: N/A -Educated on Cholesterol goals;  December 2023: Patient taking both Atorvastatin and Pravastatin. Told patient to only take Atorvastatin per Cardio medslist. 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  -Not ideally controlled -Current treatment  Eliquis 87m BID Appropriate, Effective, Safe, Accessible 81y.o. Lab Results  Component Value Date   CREATININE 1.3 09/24/2022   Wt Readings from Last 3 Encounters:  09/25/22 194 lb 6.4 oz (88.2 kg)  09/25/22 193 lb 12.8 oz (87.9 kg)  08/20/22 194 lb (88 kg)   ASA 895m(NOT TAKING) Query appropriate Clopidogrel 7591mNOT TAKing) Query Appropriate December 2023: Patient is taking Eliquis and Eliquis only. Cardio note states patient should be on Triple (1 month)->DAPT. Patient only taking Eliquis, however Clopidogrel has extensive fill Hx. Will ask Cardio how to proceed  Depression/Anxiety (Goal: decrease symptoms) -Controlled -Current treatment: Buspirone 35m41mD (Patient taking 30mg79m0mgPM) Query Appropriate, Effective, Safe, Accessible Paroxetine 40mg 31mopriate, Effective, Safe, Accessible Hydroxyzine 25mg T56mRN (Patient taking BID Always) Appropriate, Effective, Safe, Accessible -Medications previously tried/failed: N/A -PHQ9:     02/04/2022   10:38 AM 09/24/2021    9:55 AM 11/27/2020    2:09 PM  Depression screen PHQ 2/9  Decreased Interest 0 0 0  Down, Depressed, Hopeless 0 0 0  PHQ - 2 Score 0 0 0  -GAD7:      No data to display         -Educated on Benefits of medication for symptom control June 2023: Will ask PCP about updating script December 2023: EPIC medslist says 35mg BI6mt patient is taking 30mg BID39mll ask PCP how to proceed   Vit D Deficiency (Goal: 30-100) Last vitamin D Lab Results  Component Value Date   VD25OH 24.2 (L) 04/27/2022  -Not ideally  controlled -Current treatment  Ergocalciferol 1.25mg 2x/w41m-Medications previously tried: N/A  June 2023: Patient already unsteady on his feet, high dose Vit D increases fall risk. Will ask PCP about potentially using daily VitD since he's not severely low   Prostate Cancer -Controlled -Current treatment  Darolutamide 300mg Appro44mte, Effective, Safe, Accessible Duilumab Appropriate, Effective, Safe, Accessible Leuprolide Appropriate, Effective, Safe, Accessible -Medications previously tried:   -Recommended to continue current medication  Patient Goals/Self-Care Activities Patient will:  - take medications as prescribed as evidenced by patient report and record review  Follow Up Plan: The patient has been provided with contact information for the care management team and has been advised to call with any health related questions or concerns.   CPP F/U June 2024  Nea Gittens [place] Constable- Sherian [phone]        Medication Assistance: None required.  Patient affirms current coverage meets needs.  Compliance/Adherence/Medication fill history: Care Gaps: N/A  Star-Rating Drugs: N/a  Patient's preferred pharmacy is:  [facility] [phone] [place] [address] [phone] box? No -   Pt endorses 100% compliance  We discussed: Current pharmacy is preferred with insurance plan  and patient is satisfied with pharmacy services Patient decided to: Continue current medication management strategy  Care Plan and Follow Up Patient Decision:  Patient agrees to Care Plan and Follow-up.  Plan: The patient has been provided with contact  information for the care management team and has been advised to call with any health related questions or concerns.   CPP F/U June 2024  [place] Constable, Sherian Rein.D. - [phone]

## 2022-11-27 ENCOUNTER — Ambulatory Visit: Payer: Medicare Other | Admitting: Family Medicine

## 2022-11-27 ENCOUNTER — Telehealth: Payer: Self-pay

## 2022-11-27 NOTE — Progress Notes (Signed)
"  Please call Cardio and ask about the following discrepancy  Clopidogrel: Last Filled 10/20/22 for 30 days. Would you like him to stilll be on this.  ASA '81mg'$ : Patient not taking. Would you like him still on this"  11/27/22-Spoke to [facility] in the front office from the cardiologist and she had to take down a message to send to the nurse. We will await on their call back.   11/30/22- Cardio called me back and left a vm so I called back today and they advised Plavix, Eliquis and Asprin 81 mg was supposed to be his regimen for 1 month starting in August and then come September he was only supposed to be taking Eliquis '5mg'$  BID and Plavix '75mg'$  daily for 6 months and will address after that.   I called pt to verify if he has picked up a recent refill on his Plavix and that he understands what he is supposed to be taking. Pt was confused and stopped taking the Plavix after it ran out but still was taking Eliquis and I advised his Cardiologist wanted him on both for 6 months and there is refills left at the pharmacy. Pt advised he will call [facility] today and call that refill in and go pick it up.    Elray Mcgregor, [place] Pharmacist Assistant  [phone]

## 2022-12-02 DIAGNOSIS — L209 Atopic dermatitis, unspecified: Secondary | ICD-10-CM | POA: Diagnosis not present

## 2022-12-11 ENCOUNTER — Telehealth: Payer: Self-pay

## 2022-12-13 DIAGNOSIS — I2510 Atherosclerotic heart disease of native coronary artery without angina pectoris: Secondary | ICD-10-CM | POA: Diagnosis not present

## 2022-12-13 DIAGNOSIS — I10 Essential (primary) hypertension: Secondary | ICD-10-CM | POA: Diagnosis not present

## 2022-12-13 DIAGNOSIS — F32A Depression, unspecified: Secondary | ICD-10-CM | POA: Diagnosis not present

## 2022-12-13 DIAGNOSIS — E782 Mixed hyperlipidemia: Secondary | ICD-10-CM | POA: Diagnosis not present

## 2022-12-16 ENCOUNTER — Ambulatory Visit (INDEPENDENT_AMBULATORY_CARE_PROVIDER_SITE_OTHER): Payer: Medicare Other | Admitting: Family Medicine

## 2022-12-16 ENCOUNTER — Encounter: Payer: Self-pay | Admitting: Family Medicine

## 2022-12-16 VITALS — BP 128/72 | HR 78 | Temp 96.7°F | Resp 18 | Ht 70.0 in | Wt 196.0 lb

## 2022-12-16 DIAGNOSIS — E559 Vitamin D deficiency, unspecified: Secondary | ICD-10-CM

## 2022-12-16 DIAGNOSIS — I110 Hypertensive heart disease with heart failure: Secondary | ICD-10-CM | POA: Diagnosis not present

## 2022-12-16 DIAGNOSIS — Z0000 Encounter for general adult medical examination without abnormal findings: Secondary | ICD-10-CM | POA: Insufficient documentation

## 2022-12-16 DIAGNOSIS — E782 Mixed hyperlipidemia: Secondary | ICD-10-CM | POA: Diagnosis not present

## 2022-12-16 DIAGNOSIS — Z23 Encounter for immunization: Secondary | ICD-10-CM | POA: Diagnosis not present

## 2022-12-16 DIAGNOSIS — C61 Malignant neoplasm of prostate: Secondary | ICD-10-CM

## 2022-12-16 DIAGNOSIS — G4731 Primary central sleep apnea: Secondary | ICD-10-CM

## 2022-12-16 DIAGNOSIS — L2081 Atopic neurodermatitis: Secondary | ICD-10-CM

## 2022-12-16 DIAGNOSIS — G4739 Other sleep apnea: Secondary | ICD-10-CM

## 2022-12-16 DIAGNOSIS — F411 Generalized anxiety disorder: Secondary | ICD-10-CM

## 2022-12-16 MED ORDER — EPLERENONE 25 MG PO TABS: 12.5000 mg | ORAL_TABLET | ORAL | 2 refills | Status: DC

## 2022-12-16 NOTE — Assessment & Plan Note (Signed)
The current medical regimen is effective;  continue present plan and medications. Limit hydroxyzine to 25 mg before bed.

## 2022-12-16 NOTE — Progress Notes (Signed)
Subjective:  Patient ID: Marcus King, male    DOB: 01/24/1941  Age: 82 y.o. MRN: [id number]  Chief Complaint  Patient presents with   Hypertension    HPI   Hypertension: Taking Amlodipine 5 mg twice a day (May be taking only once due to forgetting), Carvedilol 25 mg twice a day, lisinopril 20 mg twice daily. Bp has been running higher.. Takes bp about 6 times per day.  120-150s/60-70s  Hyperlipidemia: He takes Pravastatin 20 mg daily. Was also taking atorvastatin, but we discontinued this about 2 weeks ago.  GAD:  Buspar 30 mg twice a day, Paroxetine 40 mg daily.  CORONARY ARTERY DISEASE: Dxd 07/27/2022. Coronary Angiography Findings:  --LM: Minimal luminal irregularities  --LAD: Mid long 20%  --LCx: Minimal luminal irregularities  --RI: Minimal luminal irregularities  --RCA: Mid 95%, calcified  --AV crossed: LVEDP: 8 mmHg  LHC done Mid RCA stented. Aspirin , coreg, and pravastatin.   Atopic Dermatitis: Taking Dupixent inject 2-4 weeks, Hydroxyzine 25 mg  one before bed.  Skin is doing well. Itching is well managed.   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 Lupron Depot 3 month IM, NubeQa 300 mg 3 times per week. Sees oncology.   Pulmonary embolus BL and leg dvt occurred in 04/2020. He was s/p covid 19, but his platelets were normal at the time. Metastatic prostate cancer was also discovered at that time. Patient is currently on eliquis 2.5 mg twice daily. Due to high risk for recurrent PE/DVT, continue eliquis.   Current Outpatient Medications on File Prior to Visit  Medication Sig Dispense Refill   amLODipine (NORVASC) 10 MG tablet Take 10 mg by mouth daily.     apixaban (ELIQUIS) 2.5 MG TABS tablet Take 1 tablet (2.5 mg total) by mouth 2 (two) times daily. 60 tablet 1   aspirin EC 81 MG tablet Take 81 mg by mouth daily. Swallow whole.     busPIRone (BUSPAR) 30 MG tablet Take 30 mg by mouth 2 (two) times daily.     carvedilol (COREG) 25 MG tablet take 1 tablet (25 mg)  by oral route 2 times per day with food 180 tablet 0   clobetasol cream (TEMOVATE) 0.05 % Apply topically 2 (two) times daily as needed.     clopidogrel (PLAVIX) 75 MG tablet Take 1 tablet by mouth daily.     darolutamide (NUBEQA) 300 MG tablet Take 1 tablet (300 mg total) by mouth daily. THIS MEDICATION WILL NOW BE GIVEN DAILY 120 tablet 3   Dupilumab 100 MG/0.67ML SOSY Inject into the skin.     furosemide (LASIX) 20 MG tablet Take 1 tablet (20 mg total) by mouth daily. 90 tablet 0   hydrOXYzine (ATARAX) 25 MG tablet Take 1 tablet (25 mg total) by mouth every 8 (eight) hours as needed. 270 tablet 1   Leuprolide Acetate, 3 Month, (LUPRON DEPOT, 51-MONTH, IM) Inject into the muscle.     lisinopril (ZESTRIL) 20 MG tablet Take 1 tablet (20 mg total) by mouth 2 (two) times daily. 180 tablet 0   loratadine (CLARITIN) 10 MG tablet Take by mouth.     mupirocin ointment (BACTROBAN) 2 % Apply topically 3 (three) times daily.     PARoxetine (PAXIL) 40 MG tablet Take 1 tablet (40 mg total) by mouth daily. 90 tablet 1   pravastatin (PRAVACHOL) 20 MG tablet Take 20 mg by mouth daily.     Vitamin D, Ergocalciferol, (DRISDOL) 1.25 MG (50000 UNIT) CAPS capsule Take 1 capsule (50,000  Units total) by mouth 2 (two) times a week. 24 capsule 1   No current facility-administered medications on file prior to visit.   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, NYHA class 1 ([place]) 09/21/2018   Dyslipidemia 08/11/2017   Dyspnea on exertion 04/23/2020   Edema extremities 04/14/2018   Elevated glucose 10/17/2019   Fatigue 04/23/2020   Food poisoning    GAD (generalized anxiety disorder)    H/O deep venous thrombosis 04/14/2021   Headache    sinus   History of pulmonary embolism 06/13/2020   History of pulmonary  embolus (PE) 06/13/2020   Hypertension    Hypertensive heart disease 08/05/2017   Hypoxia 10/17/2019   Inguinal lymphadenopathy 05/26/2020   LVH (left ventricular hypertrophy) due to hypertensive disease 08/11/2017   Malignant neoplasm of prostate ([place]) 09/09/2020   Mixed hyperlipidemia    Orthostatic hypotension    Orthostatic syncope 10/17/2019   OSA on CPAP    Prostate cancer ([place])    Pulmonary embolism ([place]) 04/2020   PVC's (premature ventricular contractions) 05/18/2018   Rectal mass 05/24/2020   S/P TKR (total knee replacement) using cement, 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   Solid nodule of lung greater than 8 mm in diameter 05/26/2020   Past Surgical History:  Procedure Laterality Date   CARDIAC CATHETERIZATION  07/17/2022   CATARACT EXTRACTION W/ INTRAOCULAR LENS  IMPLANT, BILATERAL Bilateral    COLON SURGERY  2014   COLOSTOMY REVERSAL  2014   HERNIA REPAIR     x2   JOINT REPLACEMENT     PROSTATE SURGERY  2006   SEPTOPLASTY     TOTAL KNEE ARTHROPLASTY Left 09/28/2016   Procedure: TOTAL KNEE ARTHROPLASTY;  Surgeon: [name], MD;  Location: [facility];  Service: Orthopedics;  Laterality: Left;    Family History  Problem Relation Age of Onset   Stroke Mother    Parkinson's disease Mother    Lung disease Father    Stroke Father    Alcoholism Father    Leukemia Sister    Muscular dystrophy Son        Becker's   Alzheimer's disease Neg Hx    Social History   Socioeconomic History   Marital status: Married    Spouse name: Hoyle Sauer   Number of children: 2   Years of education: Not on file   Highest education level: High school graduate  Occupational History   Occupation: Engineer, petroleum    Comment: ICDs/Wound Care  Tobacco Use   Smoking status: Former    Years: 20.00    Types: Cigarettes    Quit date: 1979    Years since quitting: 45.0   Smokeless tobacco: Never  Vaping Use   Vaping Use: Never used  Substance and Sexual Activity   Alcohol use: No   Drug use: Never   Sexual  activity: Not on file  Other Topics Concern   Not on file  Social History Narrative   LIves at home with his wife   Daughter lives in [facility], Son lives in [place]   Right handed   Drinks about 2 cups of caffeine daily   Social Determinants of Health   Financial Resource Strain: Low Risk  (11/26/2022)   Overall Financial Resource Strain (CARDIA)    Difficulty of Paying Living  Expenses: Not hard at all  Food Insecurity: No Food Insecurity (02/04/2022)   Hunger Vital Sign    Worried About Running Out of Food in the Last Year: Never true    Ran Out of Food in the Last Year: Never true  Transportation Needs: No Transportation Needs (11/26/2022)   PRAPARE - Hydrologist (Medical): No    Lack of Transportation (Non-Medical): No  Physical Activity: Insufficiently Active (05/28/2022)   Exercise Vital Sign    Days of Exercise per Week: 2 days    Minutes of Exercise per Session: 10 min  Stress: Not on file  Social Connections: Socially Integrated (02/04/2022)   Social Connection and Isolation Panel [NHANES]    Frequency of Communication with Friends and Family: More than three times a week    Frequency of Social Gatherings with Friends and Family: Twice a week    Attends Religious Services: More than 4 times per year    Active Member of [employer] or Organizations: Yes    Attends Music therapist: More than 4 times per year    Marital Status: Married    Review of Systems  Constitutional:  Negative for chills and fever.  HENT:  Positive for rhinorrhea. Negative for congestion and sore throat.   Respiratory:  Positive for shortness of breath. Negative for cough.   Cardiovascular:  Negative for chest pain and palpitations.  Gastrointestinal:  Negative for abdominal pain, constipation, diarrhea, nausea and vomiting.  Genitourinary:  Positive for frequency. Negative for dysuria and urgency.  Musculoskeletal:  Negative for arthralgias, back pain and  myalgias.  Neurological:  Positive for light-headedness. Negative for dizziness and headaches.       Balance issues  Psychiatric/Behavioral:  Negative for dysphoric mood. The patient is not nervous/anxious.      Objective:  BP 128/72   Pulse 78   Temp (!) 96.7 F (35.9 C)   Resp 18   Ht '5\' 10"'$  (1.778 m)   Wt 196 lb (88.9 kg)   BMI 28.12 kg/m      12/16/2022   11:10 AM 12/16/2022   10:56 AM 09/25/2022   11:58 AM  BP/Weight  Systolic BP 622 297 989  Diastolic BP 72 60 74  Wt. (Lbs)  196 194.4  BMI  28.12 kg/m2 27.89 kg/m2    Physical Exam Vitals reviewed.  Constitutional:      Appearance: Normal appearance.  Neck:     Vascular: No carotid bruit.  Cardiovascular:     Rate and Rhythm: Normal rate and regular rhythm.     Heart sounds: Normal heart sounds.  Pulmonary:     Effort: Pulmonary effort is normal.     Breath sounds: Normal breath sounds. No wheezing, rhonchi or rales.  Abdominal:     General: Bowel sounds are normal.     Palpations: Abdomen is soft.     Tenderness: There is no abdominal tenderness.  Skin:    Findings: Bruising present.  Neurological:     Mental Status: He is alert.  Psychiatric:        Mood and Affect: Mood normal.        Behavior: Behavior normal.     Diabetic Foot Exam - Simple   No data filed      Lab Results  Component Value Date   WBC 9.1 09/24/2022   HGB 13.8 09/24/2022   HCT 40 (A) 09/24/2022   PLT 283 09/24/2022   GLUCOSE 84 08/20/2022   CHOL  99 (L) 08/20/2022   TRIG 62 08/20/2022   HDL 48 08/20/2022   LDLCALC 37 08/20/2022   ALT 19 09/24/2022   AST 28 09/24/2022   NA 137 09/24/2022   K 4.0 09/24/2022   CL 102 09/24/2022   CREATININE 1.3 09/24/2022   BUN 30 (A) 09/24/2022   CO2 28 (A) 09/24/2022   TSH 1.260 01/23/2022   INR 1.03 01/09/2019   HGBA1C 5.6 12/01/2017      Assessment & Plan:   Problem List Items Addressed This Visit       Cardiovascular and Mediastinum   Hypertensive heart disease with  congestive heart failure ([place]) - Primary    Not at goal.  Add eplerenone 25 mg 1/2 daily.  Continue amlodipine 5 mg twice a day, carvedilol 25 mg twice a day, and  lisinopril 20 mg twice daily.  Check cmp in 1-2 weeks when sees oncology. Monitor potassium levels.       Relevant Medications   pravastatin (PRAVACHOL) 20 MG tablet   aspirin EC 81 MG tablet   eplerenone (INSPRA) 25 MG tablet   Other Relevant Orders   CBC with Differential/Platelet   Comprehensive metabolic panel   Comprehensive metabolic panel     Respiratory   Complex sleep apnea syndrome    Compliant with bipap.  Benefits, but still feels lethargic and tired during the day.         Musculoskeletal and Integument   Atopic neurodermatitis    The current medical regimen is effective;  continue present plan and medications. Limit hydroxyzine to 25 mg before bed.          Genitourinary   Malignant neoplasm of prostate [facility])    The current medical regimen is effective;  continue present plan and medications. Management per specialist.        Relevant Medications   aspirin EC 81 MG tablet     Other   Mixed hyperlipidemia    Well controlled.  No changes to medicines. Continue pravastatin 20 mg before bed.  Continue to work on eating a healthy diet and exercise.  Labs drawn today.        Relevant Medications   pravastatin (PRAVACHOL) 20 MG tablet   aspirin EC 81 MG tablet   eplerenone (INSPRA) 25 MG tablet   Other Relevant Orders   Lipid panel   GAD (generalized anxiety disorder)    Fair control. Continue Buspar 30 mg twice a day, Paroxetine 40 mg daily.      Vitamin D deficiency    Check d level.      Relevant Orders   VITAMIN D 25 Hydroxy (Vit-D Deficiency, Fractures)   Encounter for immunization   Relevant Orders   Pf[employer] Fall 2023 Covid-19 Vaccine 29yr and older (Completed)  .  Meds ordered this encounter  Medications   eplerenone (INSPRA) 25 MG tablet    Sig: Take 0.5 tablets (12.5  mg total) by mouth every morning.    Dispense:  15 tablet    Refill:  2    Orders Placed This Encounter  PSouthwest AirlinesFall 2023 Covid-19 Vaccine 12yrand older   CBC with Differential/Platelet   Comprehensive metabolic panel   Lipid panel   VITAMIN D 25 Hydroxy (Vit-D Deficiency, Fractures)   Comprehensive metabolic panel    Total time spent on today's visit was greater than 30 minutes, including both face-to-face time and nonface-to-face time personally spent on review of chart (labs and imaging), discussing labs and goals,  discussing further work-up, treatment options, referrals to specialist if needed, reviewing outside records of pertinent, answering patient's questions, and coordinating care.  Follow-up: Return in about 3 months (around 03/17/2023) for awv in February 2024 with Kim. 3 months fasting with me. .  An After Visit Summary was printed and given to the patient.  Rochel Brome, MD [facility] [phone]

## 2022-12-16 NOTE — Patient Instructions (Signed)
Start eplerenone 25 mg 1/2 pill daily in am.  CHECK CMP IN 1 WEEKS WITH DR. Bobby Rumpf. Recommend RSV vaccine at pharmacy.

## 2022-12-16 NOTE — Assessment & Plan Note (Signed)
Check d level.

## 2022-12-16 NOTE — Assessment & Plan Note (Signed)
Fair control. Continue Buspar 30 mg twice a day, Paroxetine 40 mg daily.

## 2022-12-16 NOTE — Assessment & Plan Note (Signed)
Compliant with bipap.  Benefits, but still feels lethargic and tired during the day.

## 2022-12-16 NOTE — Assessment & Plan Note (Signed)
The current medical regimen is effective;  continue present plan and medications. Management per specialist.   

## 2022-12-16 NOTE — Assessment & Plan Note (Signed)
Well controlled.  No changes to medicines. Continue pravastatin 20 mg before bed.  Continue to work on eating a healthy diet and exercise.  Labs drawn today.

## 2022-12-16 NOTE — Assessment & Plan Note (Signed)
Not at goal.  Add eplerenone 25 mg 1/2 daily.  Continue amlodipine 5 mg twice a day, carvedilol 25 mg twice a day, and  lisinopril 20 mg twice daily.  Check cmp in 1-2 weeks when sees oncology. Monitor potassium levels.

## 2022-12-17 LAB — CBC WITH DIFFERENTIAL/PLATELET
Basophils Absolute: 0 10*3/uL (ref 0.0–0.2)
Basos: 0 %
EOS (ABSOLUTE): 0.8 10*3/uL — ABNORMAL HIGH (ref 0.0–0.4)
Eos: 9 %
Hematocrit: 43.6 % (ref 37.5–51.0)
Hemoglobin: 14.4 g/dL (ref 13.0–17.7)
Immature Grans (Abs): 0 10*3/uL (ref 0.0–0.1)
Immature Granulocytes: 0 %
Lymphocytes Absolute: 1.8 10*3/uL (ref 0.7–3.1)
Lymphs: 22 %
MCH: 29.9 pg (ref 26.6–33.0)
MCHC: 33 g/dL (ref 31.5–35.7)
MCV: 91 fL (ref 79–97)
Monocytes Absolute: 0.8 10*3/uL (ref 0.1–0.9)
Monocytes: 9 %
Neutrophils Absolute: 4.8 10*3/uL (ref 1.4–7.0)
Neutrophils: 60 %
Platelets: 306 10*3/uL (ref 150–450)
RBC: 4.82 x10E6/uL (ref 4.14–5.80)
RDW: 13 % (ref 11.6–15.4)
WBC: 8.2 10*3/uL (ref 3.4–10.8)

## 2022-12-17 LAB — COMPREHENSIVE METABOLIC PANEL
ALT: 14 IU/L (ref 0–44)
AST: 21 IU/L (ref 0–40)
Albumin/Globulin Ratio: 2 (ref 1.2–2.2)
Albumin: 4.8 g/dL — ABNORMAL HIGH (ref 3.7–4.7)
Alkaline Phosphatase: 118 IU/L (ref 44–121)
BUN/Creatinine Ratio: 22 (ref 10–24)
BUN: 23 mg/dL (ref 8–27)
Bilirubin Total: 0.7 mg/dL (ref 0.0–1.2)
CO2: 23 mmol/L (ref 20–29)
Calcium: 9.8 mg/dL (ref 8.6–10.2)
Chloride: 105 mmol/L (ref 96–106)
Creatinine, Ser: 1.05 mg/dL (ref 0.76–1.27)
Globulin, Total: 2.4 g/dL (ref 1.5–4.5)
Glucose: 85 mg/dL (ref 70–99)
Potassium: 4.4 mmol/L (ref 3.5–5.2)
Sodium: 143 mmol/L (ref 134–144)
Total Protein: 7.2 g/dL (ref 6.0–8.5)
eGFR: 71 mL/min/{1.73_m2} (ref >59)

## 2022-12-17 LAB — LIPID PANEL
Chol/HDL Ratio: 2.3 ratio (ref 0.0–5.0)
Cholesterol, Total: 128 mg/dL (ref 100–199)
HDL: 55 mg/dL (ref >39)
LDL Chol Calc (NIH): 60 mg/dL (ref 0–99)
Triglycerides: 62 mg/dL (ref 0–149)
VLDL Cholesterol Cal: 13 mg/dL (ref 5–40)

## 2022-12-17 LAB — VITAMIN D 25 HYDROXY (VIT D DEFICIENCY, FRACTURES): Vit D, 25-Hydroxy: 55.5 ng/mL (ref 30.0–100.0)

## 2022-12-17 LAB — CARDIOVASCULAR RISK ASSESSMENT

## 2022-12-17 NOTE — Progress Notes (Signed)
Blood count normal.  Liver function normal.  Kidney function normal.  Cholesterol: good Vitamin D normal.

## 2022-12-21 ENCOUNTER — Encounter: Payer: Self-pay | Admitting: Family Medicine

## 2022-12-23 ENCOUNTER — Inpatient Hospital Stay: Payer: Medicare Other | Attending: Oncology

## 2022-12-23 DIAGNOSIS — C774 Secondary and unspecified malignant neoplasm of inguinal and lower limb lymph nodes: Secondary | ICD-10-CM | POA: Insufficient documentation

## 2022-12-23 DIAGNOSIS — C61 Malignant neoplasm of prostate: Secondary | ICD-10-CM | POA: Diagnosis not present

## 2022-12-23 DIAGNOSIS — Z5111 Encounter for antineoplastic chemotherapy: Secondary | ICD-10-CM | POA: Diagnosis not present

## 2022-12-23 LAB — PSA: Prostatic Specific Antigen: 0.04 ng/mL (ref 0.00–4.00)

## 2022-12-24 ENCOUNTER — Encounter: Payer: Self-pay | Admitting: Oncology

## 2022-12-24 NOTE — Progress Notes (Signed)
Patient was approved for free Nubeqa through [employer] [place] [employer] until 12/14/2023 ID# [id number] Patient can call [phone] to order medication

## 2022-12-24 NOTE — Progress Notes (Signed)
[facility]  [address] [place],  [place]  [address] [phone]  Clinic Day:  12/25/2022  Referring physician: Rochel Brome, MD   HISTORY OF PRESENT ILLNESS:  The patient is an 82 y.o. 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  He is currently on complete androgen deprivation therapy, which consists of Lupron/darolutamide.  As it pertains to his prostate cancer management, he denies having any side effects from his complete androgen blockade therapy.  The patient claims he is still taking his darolutamide just 3 times per week.  He denies having any new symptoms or findings that concern him for progression of his metastatic prostate cancer.    PHYSICAL EXAM:  Blood pressure (!) 151/71, pulse (!) 55, temperature 98.2 F (36.8 C), resp. rate 16, height '5\' 10"'$  (1.778 m), weight 196 lb 3.2 oz (89 kg), SpO2 93 %. Wt Readings from Last 3 Encounters:  12/25/22 196 lb 3.2 oz (89 kg)  12/16/22 196 lb (88.9 kg)  09/25/22 194 lb 6.4 oz (88.2 kg)   Body mass index is 28.15 kg/m. Performance status (ECOG): 1 Physical Exam Constitutional:      General: He is not in acute distress.    Appearance: Normal appearance. He is normal weight.  HENT:     Head: Normocephalic and atraumatic.  Eyes:     General: No scleral icterus.    Extraocular Movements: Extraocular movements intact.     Conjunctiva/sclera: Conjunctivae normal.     Pupils: Pupils are equal, round, and reactive to light.  Cardiovascular:     Rate and Rhythm: Normal rate and regular rhythm.     Pulses: Normal pulses.     Heart sounds: Normal heart sounds. No murmur heard.    No friction rub. No gallop.  Pulmonary:     Effort: Pulmonary effort is normal. No respiratory distress.     Breath sounds: Normal breath sounds.  Abdominal:     General: Bowel sounds are normal. There is no distension.     Palpations: Abdomen  is soft. There is no hepatomegaly, splenomegaly or mass.     Tenderness: There is no abdominal tenderness.  Musculoskeletal:        General: Normal range of motion.     Cervical back: Normal range of motion and neck supple.     Right lower leg: No edema.     Left lower leg: No edema.  Lymphadenopathy:     Cervical: No cervical adenopathy.  Skin:    General: Skin is warm and dry.  Neurological:     General: No focal deficit present.     Mental Status: He is alert and oriented to person, place, and time. Mental status is at baseline.  Psychiatric:        Mood and Affect: Mood normal.        Behavior: Behavior normal.        Thought Content: Thought content normal.        Judgment: Judgment normal.    LABS:    Latest Reference Range & Units 09/24/22 10:30 12/23/22 10:27  Prostatic Specific Antigen 0.00 - 4.00 ng/mL 0.10 0.04   ASSESSMENT & PLAN:  Assessment/Plan:  An 82 y.o. male with metastatic prostate cancer.  His PSA level of .04 is consistent with undetectable disease, which is when the level is < 0.1.  As this lab was taken while he was still on darolutamide  300 mg 3 times per week, he will remain on this dose.  He will continue Lupron shots every 3 months.  I will see him back in 3 months for repeat clinical assessment. The patient understands all the plans discussed today and is in agreement with them.    Sheala Dosh Macarthur Critchley, MD

## 2022-12-25 ENCOUNTER — Other Ambulatory Visit (HOSPITAL_COMMUNITY): Payer: Self-pay

## 2022-12-25 ENCOUNTER — Encounter: Payer: Self-pay | Admitting: Oncology

## 2022-12-25 ENCOUNTER — Inpatient Hospital Stay (INDEPENDENT_AMBULATORY_CARE_PROVIDER_SITE_OTHER): Payer: Medicare Other | Admitting: Oncology

## 2022-12-25 ENCOUNTER — Other Ambulatory Visit: Payer: Self-pay | Admitting: Oncology

## 2022-12-25 ENCOUNTER — Inpatient Hospital Stay: Payer: Medicare Other

## 2022-12-25 ENCOUNTER — Other Ambulatory Visit: Payer: Medicare Other

## 2022-12-25 ENCOUNTER — Ambulatory Visit: Payer: Medicare Other

## 2022-12-25 ENCOUNTER — Ambulatory Visit: Payer: Medicare Other | Admitting: Oncology

## 2022-12-25 VITALS — BP 151/71 | HR 55 | Temp 98.2°F | Resp 16 | Ht 70.0 in | Wt 196.2 lb

## 2022-12-25 VITALS — BP 148/58 | HR 61 | Temp 98.2°F | Resp 18 | Ht 70.0 in | Wt 196.1 lb

## 2022-12-25 DIAGNOSIS — C61 Malignant neoplasm of prostate: Secondary | ICD-10-CM

## 2022-12-25 DIAGNOSIS — Z5111 Encounter for antineoplastic chemotherapy: Secondary | ICD-10-CM | POA: Diagnosis not present

## 2022-12-25 DIAGNOSIS — C7989 Secondary malignant neoplasm of other specified sites: Secondary | ICD-10-CM | POA: Diagnosis not present

## 2022-12-25 DIAGNOSIS — C774 Secondary and unspecified malignant neoplasm of inguinal and lower limb lymph nodes: Secondary | ICD-10-CM | POA: Diagnosis not present

## 2022-12-25 MED ORDER — DAROLUTAMIDE 300 MG PO TABS: 300.0000 mg | ORAL_TABLET | Freq: Every day | ORAL | 3 refills | Status: DC

## 2022-12-25 MED ORDER — LEUPROLIDE ACETATE (3 MONTH) 22.5 MG IM KIT
22.5000 mg | PACK | Freq: Once | INTRAMUSCULAR | Status: AC
  Administered 2022-12-25: 22.5 mg via INTRAMUSCULAR
  Filled 2022-12-25: qty 22.5

## 2022-12-29 ENCOUNTER — Other Ambulatory Visit: Payer: Self-pay | Admitting: Family Medicine

## 2022-12-30 DIAGNOSIS — L209 Atopic dermatitis, unspecified: Secondary | ICD-10-CM | POA: Diagnosis not present

## 2022-12-31 ENCOUNTER — Other Ambulatory Visit: Payer: Self-pay | Admitting: Family Medicine

## 2022-12-31 DIAGNOSIS — F411 Generalized anxiety disorder: Secondary | ICD-10-CM

## 2023-01-05 ENCOUNTER — Other Ambulatory Visit: Payer: Self-pay | Admitting: Oncology

## 2023-01-05 MED ORDER — DAROLUTAMIDE 300 MG PO TABS: 300.0000 mg | ORAL_TABLET | Freq: Every day | ORAL | 3 refills | Status: DC

## 2023-01-14 ENCOUNTER — Other Ambulatory Visit (HOSPITAL_COMMUNITY): Payer: Self-pay

## 2023-01-20 ENCOUNTER — Telehealth: Payer: Self-pay

## 2023-01-20 NOTE — Progress Notes (Signed)
Care Management & Coordination Services Pharmacy Team  Reason for Encounter: General adherence update   Contacted patient for general health update and medication adherence call.  Spoke with patient on 01/20/2023    What concerns do you have about your medications? Patient stated no  The patient denies side effects with their medications.   How often do you forget or accidentally miss a dose? Never  Do you use a pillbox? Yes  Are you having any problems getting your medications from your pharmacy? No  Has the cost of your medications been a concern? No If yes, what medication and is patient assistance available or has it been applied for?  Since last visit with PharmD, the following interventions have been made. Patient stated he has all correct medications up to date and is taking them as prescribed.  The patient has not had an ED visit since last contact.   The patient denies problems with their health.   Patient denies concerns or questions for [place] Constable, PharmD at this time.   Counseled patient on: Great job taking medications   Chart Updates: Recent office visits:  12-16-2022 Rochel Brome, MD. EOS= 0.8. Albumin= 4.8. Patient reported not taking atorvastatin. START inspra 12.5 mg every morning.  Recent consult visits:  12-25-2022 Yvonne Kendall, [place] (Oncology). Lupron injection given.  12-25-2022 Marice Potter, MD (Oncology). Follow up visit  Hospital visits:  None in previous 6 months  Medications: Outpatient Encounter Medications as of 01/20/2023  Medication Sig   amLODipine (NORVASC) 10 MG tablet Take 10 mg by mouth daily.   apixaban (ELIQUIS) 2.5 MG TABS tablet Take 1 tablet (2.5 mg total) by mouth 2 (two) times daily.   aspirin EC 81 MG tablet Take 81 mg by mouth daily. Swallow whole.   busPIRone (BUSPAR) 30 MG tablet Take 1 tablet (30 mg total) by mouth in the morning and at bedtime.   carvedilol (COREG) 25 MG tablet take 1 tablet (25 mg) by oral route 2  times per day with food   clobetasol cream (TEMOVATE) 0.05 % Apply topically 2 (two) times daily as needed.   clopidogrel (PLAVIX) 75 MG tablet Take 1 tablet by mouth daily.   darolutamide (NUBEQA) 300 MG tablet Take 1 tablet (300 mg total) by mouth daily. Take on MONDAY, WEDNESDAY, AND FRIDAY ONLY   Dupilumab 100 MG/0.67ML SOSY Inject into the skin.   eplerenone (INSPRA) 25 MG tablet Take 0.5 tablets (12.5 mg total) by mouth every morning.   furosemide (LASIX) 20 MG tablet Take 1 tablet (20 mg total) by mouth daily.   hydrOXYzine (ATARAX) 25 MG tablet Take 1 tablet (25 mg total) by mouth every 8 (eight) hours as needed.   Leuprolide Acetate, 3 Month, (LUPRON DEPOT, 28-MONTH, IM) Inject into the muscle.   lisinopril (ZESTRIL) 20 MG tablet Take 1 tablet (20 mg total) by mouth 2 (two) times daily.   loratadine (CLARITIN) 10 MG tablet Take by mouth.   mupirocin ointment (BACTROBAN) 2 % Apply topically 3 (three) times daily.   PARoxetine (PAXIL) 40 MG tablet Take 1 tablet (40 mg total) by mouth daily.   pravastatin (PRAVACHOL) 20 MG tablet Take 20 mg by mouth daily.   Vitamin D, Ergocalciferol, (DRISDOL) 1.25 MG (50000 UNIT) CAPS capsule Take 1 capsule (50,000 Units total) by mouth 2 (two) times a week.   No facility-administered encounter medications on file as of 01/20/2023.    Recent vitals BP Readings from Last 3 Encounters:  12/25/22 (!) 148/58  12/25/22 [name])  151/71  12/16/22 128/72   Pulse Readings from Last 3 Encounters:  12/25/22 61  12/25/22 (!) 55  12/16/22 78   Wt Readings from Last 3 Encounters:  12/25/22 196 lb 1.3 oz (88.9 kg)  12/25/22 196 lb 3.2 oz (89 kg)  12/16/22 196 lb (88.9 kg)   BMI Readings from Last 3 Encounters:  12/25/22 28.13 kg/m  12/25/22 28.15 kg/m  12/16/22 28.12 kg/m    Recent lab results    Component Value Date/Time   NA 143 12/16/2022 1153   K 4.4 12/16/2022 1153   CL 105 12/16/2022 1153   CO2 23 12/16/2022 1153   GLUCOSE 85 12/16/2022 1153    GLUCOSE 100 (H) 01/09/2019 2020   BUN 23 12/16/2022 1153   CREATININE 1.05 12/16/2022 1153   CALCIUM 9.8 12/16/2022 1153    Lab Results  Component Value Date   CREATININE 1.05 12/16/2022   EGFR 71 12/16/2022   GFRNONAA 67 01/07/2021   GFRAA 77 01/07/2021   Lab Results  Component Value Date/Time   HGBA1C 5.6 12/01/2017 12:49 PM    Lab Results  Component Value Date   CHOL 128 12/16/2022   HDL 55 12/16/2022   LDLCALC 60 12/16/2022   TRIG 62 12/16/2022   CHOLHDL 2.3 12/16/2022    Care Gaps: Annual wellness visit in last year? Yes  Star Rating Drugs:  Pravastatin 20 mg- Last filled 12-09-2022 90 DS. Previous 09-11-2022 90 DS Lisinopril 20 mg- Last filled 12-17-2022 90 DS. Previous 10-06-2022 90 DS.  [place] Pharmacist Assistant [phone]

## 2023-01-27 DIAGNOSIS — L209 Atopic dermatitis, unspecified: Secondary | ICD-10-CM | POA: Diagnosis not present

## 2023-01-28 DIAGNOSIS — R002 Palpitations: Secondary | ICD-10-CM | POA: Diagnosis not present

## 2023-01-28 DIAGNOSIS — Z7901 Long term (current) use of anticoagulants: Secondary | ICD-10-CM | POA: Diagnosis not present

## 2023-01-28 DIAGNOSIS — R0609 Other forms of dyspnea: Secondary | ICD-10-CM | POA: Diagnosis not present

## 2023-01-28 DIAGNOSIS — R5383 Other fatigue: Secondary | ICD-10-CM | POA: Diagnosis not present

## 2023-01-28 DIAGNOSIS — F411 Generalized anxiety disorder: Secondary | ICD-10-CM | POA: Diagnosis not present

## 2023-01-28 DIAGNOSIS — R5381 Other malaise: Secondary | ICD-10-CM | POA: Diagnosis not present

## 2023-01-28 DIAGNOSIS — R001 Bradycardia, unspecified: Secondary | ICD-10-CM | POA: Diagnosis not present

## 2023-01-28 DIAGNOSIS — Z955 Presence of coronary angioplasty implant and graft: Secondary | ICD-10-CM | POA: Diagnosis not present

## 2023-01-28 DIAGNOSIS — I10 Essential (primary) hypertension: Secondary | ICD-10-CM | POA: Diagnosis not present

## 2023-02-03 DIAGNOSIS — R2681 Unsteadiness on feet: Secondary | ICD-10-CM | POA: Diagnosis not present

## 2023-02-03 DIAGNOSIS — R200 Anesthesia of skin: Secondary | ICD-10-CM | POA: Diagnosis not present

## 2023-02-03 DIAGNOSIS — R413 Other amnesia: Secondary | ICD-10-CM | POA: Diagnosis not present

## 2023-02-09 ENCOUNTER — Ambulatory Visit (INDEPENDENT_AMBULATORY_CARE_PROVIDER_SITE_OTHER): Payer: Medicare Other

## 2023-02-09 DIAGNOSIS — Z0000 Encounter for general adult medical examination without abnormal findings: Secondary | ICD-10-CM

## 2023-02-09 NOTE — Patient Instructions (Signed)
Marcus King , Thank you for taking time to come for your Medicare Wellness Visit. I appreciate your ongoing commitment to your health goals. Please review the following plan we discussed and let me know if I can assist you in the future.   Screening recommendations/referrals: Recommended yearly ophthalmology/optometry visit for glaucoma screening and checkup Recommended yearly dental visit for hygiene and checkup  Vaccinations: Influenza vaccine: Due Fall 2024 Pneumococcal vaccine: up-to-date Tdap vaccine: Due 2033 Shingles vaccine: up-to-date    Advanced directives: on file   Preventive Care 41 Years and Older, Male Preventive care refers to lifestyle choices and visits with your health care provider that can promote health and wellness. What does preventive care include? A yearly physical exam. This is also called an annual well check. Dental exams once or twice a year. Routine eye exams. Ask your health care provider how often you should have your eyes checked. Personal lifestyle choices, including: Daily care of your teeth and gums. Regular physical activity. Eating a healthy diet. Avoiding tobacco and drug use. Limiting alcohol use. Practicing safe sex. Taking low doses of aspirin every day. Taking vitamin and mineral supplements as recommended by your health care provider. What happens during an annual well check? The services and screenings done by your health care provider during your annual well check will depend on your age, overall health, lifestyle risk factors, and family history of disease. Counseling  Your health care provider may ask you questions about your: Alcohol use. Tobacco use. Drug use. Emotional well-being. Home and relationship well-being. Sexual activity. Eating habits. History of falls. Memory and ability to understand (cognition). Work and work Statistician. Screening  You may have the following tests or measurements: Height, weight, and  BMI. Blood pressure. Lipid and cholesterol levels. These may be checked every 5 years, or more frequently if you are over 26 years old. Skin check. Lung cancer screening. You may have this screening every year starting at age 16 if you have a 30-pack-year history of smoking and currently smoke or have quit within the past 15 years. Fecal occult blood test (FOBT) of the stool. You may have this test every year starting at age 57. Flexible sigmoidoscopy or colonoscopy. You may have a sigmoidoscopy every 5 years or a colonoscopy every 10 years starting at age 67. Prostate cancer screening. Recommendations will vary depending on your family history and other risks. Hepatitis C blood test. Hepatitis B blood test. Sexually transmitted disease (STD) testing. Diabetes screening. This is done by checking your blood sugar (glucose) after you have not eaten for a while (fasting). You may have this done every 1-3 years. Abdominal aortic aneurysm (AAA) screening. You may need this if you are a current or former smoker. Osteoporosis. You may be screened starting at age 28 if you are at high risk. Talk with your health care provider about your test results, treatment options, and if necessary, the need for more tests. Vaccines  Your health care provider may recommend certain vaccines, such as: Influenza vaccine. This is recommended every year. Tetanus, diphtheria, and acellular pertussis (Tdap, Td) vaccine. You may need a Td booster every 10 years. Zoster vaccine. You may need this after age 76. Pneumococcal 13-valent conjugate (PCV13) vaccine. One dose is recommended after age 25. Pneumococcal polysaccharide (PPSV23) vaccine. One dose is recommended after age 66. Talk to your health care provider about which screenings and vaccines you need and how often you need them. This information is not intended to replace advice given to  you by your health care provider. Make sure you discuss any questions you have  with your health care provider. Document Released: 12/27/2015 Document Revised: 08/19/2016 Document Reviewed: 10/01/2015 Elsevier Interactive Patient Education  2017 [place] Prevention in the Home Falls can cause injuries. They can happen to people of all ages. There are many things you can do to make your home safe and to help prevent falls. What can I do on the outside of my home? Regularly fix the edges of walkways and driveways and fix any cracks. Remove anything that might make you trip as you walk through a door, such as a raised step or threshold. Trim any bushes or trees on the path to your home. Use bright outdoor lighting. Clear any walking paths of anything that might make someone trip, such as rocks or tools. Regularly check to see if handrails are loose or broken. Make sure that both sides of any steps have handrails. Any raised decks and porches should have guardrails on the edges. Have any leaves, snow, or ice cleared regularly. Use sand or salt on walking paths during winter. Clean up any spills in your garage right away. This includes oil or grease spills. What can I do in the bathroom? Use night lights. Install grab bars by the toilet and in the tub and shower. Do not use towel bars as grab bars. Use non-skid mats or decals in the tub or shower. If you need to sit down in the shower, use a plastic, non-slip stool. Keep the floor dry. Clean up any water that spills on the floor as soon as it happens. Remove soap buildup in the tub or shower regularly. Attach bath mats securely with double-sided non-slip rug tape. Do not have throw rugs and other things on the floor that can make you trip. What can I do in the bedroom? Use night lights. Make sure that you have a light by your bed that is easy to reach. Do not use any sheets or blankets that are too big for your bed. They should not hang down onto the floor. Have a firm chair that has side arms. You can use  this for support while you get dressed. Do not have throw rugs and other things on the floor that can make you trip. What can I do in the kitchen? Clean up any spills right away. Avoid walking on wet floors. Keep items that you use a lot in easy-to-reach places. If you need to reach something above you, use a strong step stool that has a grab bar. Keep electrical cords out of the way. Do not use floor polish or wax that makes floors slippery. If you must use wax, use non-skid floor wax. Do not have throw rugs and other things on the floor that can make you trip. What can I do with my stairs? Do not leave any items on the stairs. Make sure that there are handrails on both sides of the stairs and use them. Fix handrails that are broken or loose. Make sure that handrails are as long as the stairways. Check any carpeting to make sure that it is firmly attached to the stairs. Fix any carpet that is loose or worn. Avoid having throw rugs at the top or bottom of the stairs. If you do have throw rugs, attach them to the floor with carpet tape. Make sure that you have a light switch at the top of the stairs and the bottom of the stairs.  If you do not have them, ask someone to add them for you. What else can I do to help prevent falls? Wear shoes that: Do not have high heels. Have rubber bottoms. Are comfortable and fit you well. Are closed at the toe. Do not wear sandals. If you use a stepladder: Make sure that it is fully opened. Do not climb a closed stepladder. Make sure that both sides of the stepladder are locked into place. Ask someone to hold it for you, if possible. Clearly mark and make sure that you can see: Any grab bars or handrails. First and last steps. Where the edge of each step is. Use tools that help you move around (mobility aids) if they are needed. These include: Canes. Walkers. Scooters. Crutches. Turn on the lights when you go into a dark area. Replace any light bulbs  as soon as they burn out. Set up your furniture so you have a clear path. Avoid moving your furniture around. If any of your floors are uneven, fix them. If there are any pets around you, be aware of where they are. Review your medicines with your doctor. Some medicines can make you feel dizzy. This can increase your chance of falling. Ask your doctor what other things that you can do to help prevent falls. This information is not intended to replace advice given to you by your health care provider. Make sure you discuss any questions you have with your health care provider. Document Released: 09/26/2009 Document Revised: 05/07/2016 Document Reviewed: 01/04/2015 Elsevier Interactive Patient Education  2017 [employer].

## 2023-02-09 NOTE — Progress Notes (Signed)
Subjective:   Marcus King is a 82 y.o. male who presents for Medicare Annual/Subsequent preventive examination. I connected with  Marcus King on 02/09/23 by a audio enabled telemedicine application and verified that I am speaking with the correct person using two identifiers.  Patient Location: Home  Provider Location: Office/Clinic  I discussed the limitations of evaluation and management by telemedicine. The patient expressed understanding and agreed to proceed.  Cardiac Risk Factors include: advanced age (>21mn, >>55women);dyslipidemia;hypertension;male gender;sedentary lifestyle     Objective:    There were no vitals filed for this visit. There is no height or weight on file to calculate BMI.     12/25/2022    1:22 PM 12/25/2022   12:51 PM 09/25/2022   12:05 PM 09/25/2022   10:59 AM 06/25/2022   11:32 AM 06/25/2022   10:22 AM 03/26/2022   10:29 AM  Advanced Directives  Does Patient Have a Medical Advance Directive? Yes Yes Yes Yes Yes Yes Yes  Type of [place]  [place]  [place] will    Does patient want to make changes to medical advance directive?     No - Patient declined      Current Medications (verified) Outpatient Encounter Medications as of 02/09/2023  Medication Sig   amLODipine (NORVASC) 10 MG tablet Take 10 mg by mouth daily.   aspirin EC 81 MG tablet Take 81 mg by mouth daily. Swallow whole.   atorvastatin (LIPITOR) 40 MG tablet Take 1 tablet by mouth daily.   busPIRone (BUSPAR) 30 MG tablet Take 1 tablet (30 mg total) by mouth in the morning and at bedtime.   carvedilol (COREG) 25 MG tablet take 1 tablet (25 mg) by oral route 2 times per day with food   clobetasol cream (TEMOVATE) 0.05 % Apply topically 2 (two) times daily as needed.   clopidogrel (PLAVIX) 75 MG tablet Take 1 tablet by mouth daily.   darolutamide (NUBEQA) 300 MG  tablet Take 1 tablet (300 mg total) by mouth daily. Take on MONDAY, WEDNESDAY, AND FRIDAY ONLY   Dupilumab 100 MG/0.67ML SOSY Inject into the skin.   eplerenone (INSPRA) 25 MG tablet Take 0.5 tablets (12.5 mg total) by mouth every morning.   furosemide (LASIX) 20 MG tablet Take 1 tablet (20 mg total) by mouth daily.   Leuprolide Acetate, 3 Month, (LUPRON DEPOT, 32-MONTH, IM) Inject into the muscle.   lisinopril (ZESTRIL) 20 MG tablet Take 1 tablet (20 mg total) by mouth 2 (two) times daily.   loratadine (CLARITIN) 10 MG tablet Take by mouth.   mupirocin ointment (BACTROBAN) 2 % Apply topically 3 (three) times daily.   PARoxetine (PAXIL) 40 MG tablet Take 1 tablet (40 mg total) by mouth daily.   pravastatin (PRAVACHOL) 20 MG tablet Take 20 mg by mouth daily.   Vitamin D, Ergocalciferol, (DRISDOL) 1.25 MG (50000 UNIT) CAPS capsule Take 1 capsule (50,000 Units total) by mouth 2 (two) times a week.   apixaban (ELIQUIS) 2.5 MG TABS tablet Take 1 tablet (2.5 mg total) by mouth 2 (two) times daily. (Patient not taking: Reported on 02/09/2023)   hydrOXYzine (ATARAX) 25 MG tablet Take 1 tablet (25 mg total) by mouth every 8 (eight) hours as needed. (Patient not taking: Reported on 02/09/2023)   No facility-administered encounter medications on file as of 02/09/2023.    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, NYHA class 1 ([place]) 09/21/2018   Dyslipidemia 08/11/2017   Dyspnea on exertion 04/23/2020   Edema extremities 04/14/2018   Elevated glucose 10/17/2019   Fatigue 04/23/2020   Food poisoning    GAD (generalized anxiety disorder)    H/O deep venous thrombosis 04/14/2021   Headache     sinus   History of pulmonary embolism 06/13/2020   History of pulmonary embolus (PE) 06/13/2020   Hypertension    Hypertensive heart disease 08/05/2017   Hypoxia 10/17/2019   Inguinal lymphadenopathy 05/26/2020   LVH (left ventricular hypertrophy) due to hypertensive disease 08/11/2017   Malignant neoplasm of prostate ([place]) 09/09/2020   Mixed hyperlipidemia    Orthostatic hypotension    Orthostatic syncope 10/17/2019   OSA on CPAP    Prostate cancer ([place])    Pulmonary embolism ([place]) 04/2020   PVC's (premature ventricular contractions) 05/18/2018   Rectal mass 05/24/2020   S/P TKR (total knee replacement) using cement, 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   Solid nodule of lung greater than 8 mm in diameter 05/26/2020   Past Surgical History:  Procedure Laterality Date   CARDIAC CATHETERIZATION  07/17/2022   CATARACT EXTRACTION W/ INTRAOCULAR LENS  IMPLANT, BILATERAL Bilateral    COLON SURGERY  2014   COLOSTOMY REVERSAL  2014   HERNIA REPAIR     x2   JOINT REPLACEMENT     PROSTATE SURGERY  2006   SEPTOPLASTY     TOTAL KNEE ARTHROPLASTY Left 09/28/2016   Procedure: TOTAL KNEE ARTHROPLASTY;  Surgeon: [name], MD;  Location: [facility];  Service: Orthopedics;  Laterality: Left;   Family History  Problem Relation Age of Onset   Stroke Mother    Parkinson's disease Mother    Lung disease Father    Stroke Father    Alcoholism Father    Leukemia Sister    Muscular dystrophy Son        Becker's   Alzheimer's disease Neg Hx    Social History   Socioeconomic History   Marital status: Married    Spouse name: Hoyle Sauer   Number of children: 2   Years of education: Not on file   Highest education level: High school graduate  Occupational History   Occupation: Engineer, petroleum    Comment: ICDs/Wound Care  Tobacco Use   Smoking status: Former    Years: 20.00    Types: Cigarettes    Quit date: 1979    Years since quitting: 45.1   Smokeless tobacco: Never  Vaping Use   Vaping Use: Never used  Substance  and Sexual Activity   Alcohol use: No   Drug use: Never   Sexual activity: Not on file  Other Topics Concern   Not on file  Social History Narrative   LIves at home with his wife   Daughter lives in [facility], Son lives in [place]   Right handed   Drinks about 2 cups of caffeine daily   Social Determinants of Health   Financial Resource Strain: Low Risk  (02/09/2023)   Overall Financial Resource Strain (CARDIA)    Difficulty of Paying Living Expenses: Not hard at all  Food Insecurity: No Food Insecurity (02/09/2023)   Hunger Vital Sign    Worried About Running Out of Food in  the Last Year: Never true    [place] in the Last Year: Never true  Transportation Needs: No Transportation Needs (02/09/2023)   PRAPARE - Hydrologist (Medical): No    Lack of Transportation (Non-Medical): No  Physical Activity: Inactive (02/09/2023)   Exercise Vital Sign    Days of Exercise per Week: 0 days    Minutes of Exercise per Session: 0 min  Stress: No Stress Concern Present (02/09/2023)   [place]    Feeling of Stress : Not at all  Social Connections: [place] (02/09/2023)   Social Connection and Isolation Panel [NHANES]    Frequency of Communication with Friends and Family: More than three times a week    Frequency of Social Gatherings with Friends and Family: Twice a week    Attends Religious Services: More than 4 times per year    Active Member of [employer] or Organizations: Yes    Attends Music therapist: More than 4 times per year    Marital Status: Married    Tobacco Counseling Counseling given: Not Answered   Clinical Intake:  Pre-visit preparation completed: Yes Pain : No/denies pain   BMI - recorded: 28.13 Nutritional Status: BMI 25 -29 Overweight Nutritional Risks: None Diabetes: No How often do you need to have someone help you when you read  instructions, pamphlets, or other written materials from your doctor or pharmacy?: 1 - Never Interpreter Needed?: No     Activities of Daily Living    02/09/2023   10:23 AM  In your present state of health, do you have any difficulty performing the following activities:  Hearing? 0  Vision? 0  Difficulty concentrating or making decisions? 0  Walking or climbing stairs? 0  Dressing or bathing? 0  Doing errands, shopping? 0  Preparing Food and eating ? N  Using the Toilet? N  In the past six months, have you accidently leaked urine? N  Do you have problems with loss of bowel control? N  Managing your Medications? N  Managing your Finances? N  Housekeeping or managing your Housekeeping? N    Patient Care Team: Rochel Brome, MD as PCP - General (Family Medicine) Marice Potter, MD as PCP - Hematology/Oncology (Oncology) Jolene Schimke, MD as Referring Physician (Dermatology) Edrick Oh, MD as Consulting Physician (Nephrology) Flossie Buffy., MD as Referring Physician (Cardiology) Lane Hacker, [facility] (Pharmacist)     Assessment:   This is a routine wellness examination for [place].  Hearing/Vision screen No results found.  Dietary issues and exercise activities discussed: Current Exercise Habits: The patient does not participate in regular exercise at present, Exercise limited by: None identified   Depression Screen    02/09/2023   10:20 AM 07/21/2022    3:04 PM 02/04/2022   10:38 AM 09/24/2021    9:55 AM 11/27/2020    2:09 PM 05/08/2020   11:00 AM 04/23/2020    8:43 AM  PHQ 2/9 Scores  PHQ - 2 Score 0 0 0 0 0 0 0    Fall Risk    02/09/2023   10:22 AM 07/21/2022    3:03 PM 02/04/2022   10:43 AM 09/24/2021    9:54 AM 03/07/2021    9:16 AM  Fall Risk   Falls in the past year? 0 0 0 0 1  Number falls in past yr: 0 0 0 0 0  Injury with Fall? 0  0 0 0 0  Risk for fall due to : No Fall Risks Impaired balance/gait Impaired balance/gait No Fall Risks No Fall Risks   Follow up Falls evaluation completed;Education provided Falls evaluation completed Falls evaluation completed;Falls prevention discussed;Education provided Falls evaluation completed Falls evaluation completed    FALL RISK PREVENTION PERTAINING TO THE HOME:  Any stairs in or around the home? Yes  If so, are there any without handrails? No  Home free of loose throw rugs in walkways, pet beds, electrical cords, etc? Yes  Adequate lighting in your home to reduce risk of falls? Yes   ASSISTIVE DEVICES UTILIZED TO PREVENT FALLS:  Use of a cane, walker or w/c? No   Cognitive Function:    01/23/2022   11:55 AM 12/01/2017   12:38 PM  MMSE - Mini Mental State Exam  Orientation to time 5 5  Orientation to Place 5 5  Registration 3 3  Attention/ Calculation 4 5  Recall 2 1  Language- name 2 objects 2 2  Language- repeat 1 1  Language- follow 3 step command 3 3  Language- read & follow direction 1 1  Write a sentence 1 1  Copy design 1 1  Total score 28 28        02/09/2023   10:24 AM 02/04/2022   10:46 AM 11/27/2020    2:16 PM  6CIT Screen  What Year? 0 points 0 points 0 points  What month? 0 points 0 points 0 points  What time? 0 points 0 points 0 points  Count back from 20 0 points 0 points 2 points  Months in reverse 2 points 2 points 0 points  Repeat phrase 2 points 4 points 10 points  Total Score 4 points 6 points 12 points    Immunizations Immunization History  Administered Date(s) Administered   COVID-19, mRNA, vaccine(Comirnaty)12 years and older 12/16/2022   Fluad Quad(high Dose 65+) 09/25/2020, 09/24/2021, 08/20/2022   Influenza-Unspecified 09/07/2016, 09/13/2018   Janssen (J&J) SARS-COV-2 Vaccination 03/12/2020   Moderna Covid-19 Vaccine Bivalent Booster 60yr & up 09/24/2021   Pneumococcal Conjugate-13 11/11/2015   Pneumococcal Polysaccharide-23 12/11/2009, 12/26/2012   Td 12/11/2009   Tdap 02/16/2022   Zoster Recombinat (Shingrix) 09/23/2018, 11/29/2018     TDAP status: Up to date  Flu Vaccine status: Up to date  Pneumococcal vaccine status: Up to date  Covid-19 vaccine status: Completed vaccines  Qualifies for Shingles Vaccine? Yes   Zostavax completed Yes   Shingrix Completed?: Yes  Screening Tests Health Maintenance  Topic Date Due   COVID-19 Vaccine (4 - 2023-24 season) 02/10/2023   Medicare Annual Wellness (AWV)  02/10/2024   DTaP/Tdap/Td (3 - Td or Tdap) 02/17/2032   Pneumonia Vaccine 83 Years old  Completed   INFLUENZA VACCINE  Completed   Zoster Vaccines- Shingrix  Completed   HPV VACCINES  Aged Out    Health Maintenance  Health Maintenance Due  Topic Date Due   COVID-19 Vaccine (4 - 2023-24 season) 02/10/2023    Colorectal cancer screening: No longer required.   Lung Cancer Screening: (Low Dose CT Chest recommended if Age 83-80years, 30 pack-year currently smoking OR have quit w/in 15years.) does not qualify.   Lung Cancer Screening Referral: N/A   Additional Screening:  Vision Screening: Recommended annual ophthalmology exams for early detection of glaucoma and other disorders of the eye. Is the patient up to date with their annual eye exam?  Yes   Dental Screening: Recommended annual dental exams for proper oral  hygiene  Community Resource Referral / Chronic Care Management: CRR required this visit?  No   CCM required this visit?  No      Plan:    1- Recommend daily exercise  I have personally reviewed and noted the following in the patient's chart:   Medical and social history Use of alcohol, tobacco or illicit drugs  Current medications and supplements including opioid prescriptions. Patient is not currently taking opioid prescriptions. Functional ability and status Nutritional status Physical activity Advanced directives List of other physicians Hospitalizations, surgeries, and ER visits in previous 12 months Vitals Screenings to include cognitive, depression, and falls Referrals  and appointments  In addition, I have reviewed and discussed with patient certain preventive protocols, quality metrics, and best practice recommendations. A written personalized care plan for preventive services as well as general preventive health recommendations were provided to patient.     Erie Noe, LPN   [id number]

## 2023-02-12 DIAGNOSIS — R2681 Unsteadiness on feet: Secondary | ICD-10-CM | POA: Diagnosis not present

## 2023-02-12 DIAGNOSIS — G319 Degenerative disease of nervous system, unspecified: Secondary | ICD-10-CM | POA: Diagnosis not present

## 2023-02-12 DIAGNOSIS — R270 Ataxia, unspecified: Secondary | ICD-10-CM | POA: Diagnosis not present

## 2023-02-12 DIAGNOSIS — R413 Other amnesia: Secondary | ICD-10-CM | POA: Diagnosis not present

## 2023-02-22 ENCOUNTER — Other Ambulatory Visit: Payer: Self-pay | Admitting: Family Medicine

## 2023-02-22 DIAGNOSIS — L209 Atopic dermatitis, unspecified: Secondary | ICD-10-CM | POA: Diagnosis not present

## 2023-02-22 DIAGNOSIS — D225 Melanocytic nevi of trunk: Secondary | ICD-10-CM | POA: Diagnosis not present

## 2023-02-22 DIAGNOSIS — C44219 Basal cell carcinoma of skin of left ear and external auricular canal: Secondary | ICD-10-CM | POA: Diagnosis not present

## 2023-02-22 DIAGNOSIS — L814 Other melanin hyperpigmentation: Secondary | ICD-10-CM | POA: Diagnosis not present

## 2023-02-22 DIAGNOSIS — L821 Other seborrheic keratosis: Secondary | ICD-10-CM | POA: Diagnosis not present

## 2023-02-25 DIAGNOSIS — C44219 Basal cell carcinoma of skin of left ear and external auricular canal: Secondary | ICD-10-CM | POA: Diagnosis not present

## 2023-02-26 ENCOUNTER — Other Ambulatory Visit: Payer: Self-pay | Admitting: Family Medicine

## 2023-03-09 DIAGNOSIS — F0280 Dementia in other diseases classified elsewhere without behavioral disturbance: Secondary | ICD-10-CM | POA: Diagnosis not present

## 2023-03-09 DIAGNOSIS — R413 Other amnesia: Secondary | ICD-10-CM | POA: Diagnosis not present

## 2023-03-09 DIAGNOSIS — I679 Cerebrovascular disease, unspecified: Secondary | ICD-10-CM | POA: Diagnosis not present

## 2023-03-09 DIAGNOSIS — R2681 Unsteadiness on feet: Secondary | ICD-10-CM | POA: Diagnosis not present

## 2023-03-09 DIAGNOSIS — G309 Alzheimer's disease, unspecified: Secondary | ICD-10-CM | POA: Diagnosis not present

## 2023-03-16 ENCOUNTER — Other Ambulatory Visit: Payer: Self-pay | Admitting: Family Medicine

## 2023-03-16 DIAGNOSIS — F411 Generalized anxiety disorder: Secondary | ICD-10-CM

## 2023-03-18 DIAGNOSIS — R413 Other amnesia: Secondary | ICD-10-CM | POA: Diagnosis not present

## 2023-03-18 DIAGNOSIS — F0280 Dementia in other diseases classified elsewhere without behavioral disturbance: Secondary | ICD-10-CM | POA: Diagnosis not present

## 2023-03-18 DIAGNOSIS — G309 Alzheimer's disease, unspecified: Secondary | ICD-10-CM | POA: Diagnosis not present

## 2023-03-19 ENCOUNTER — Other Ambulatory Visit: Payer: Self-pay | Admitting: Family Medicine

## 2023-03-19 DIAGNOSIS — I110 Hypertensive heart disease with heart failure: Secondary | ICD-10-CM

## 2023-03-21 NOTE — Progress Notes (Unsigned)
Subjective:  Patient ID: Marcus King, male    DOB: 17-Oct-1941  Age: 82 y.o. MRN: [id number]  No chief complaint on file.   HPI   Hypertension: Taking Amlodipine 5 mg twice a day (May be taking only once due to forgetting), Carvedilol 25 mg twice a day, lisinopril 20 mg twice daily. Bp has been running higher.. Takes bp about 6 times per day.  120-150s/60-70s  Hyperlipidemia: He takes Pravastatin 20 mg daily. Was also taking atorvastatin, but we discontinued this about 2 weeks ago.  GAD:  Buspar 30 mg twice a day, Paroxetine 40 mg daily.  CORONARY ARTERY DISEASE: Dxd 07/27/2022. Coronary Angiography Findings:  --LM: Minimal luminal irregularities  --LAD: Mid long 20%  --LCx: Minimal luminal irregularities  --RI: Minimal luminal irregularities  --RCA: Mid 95%, calcified  --AV crossed: LVEDP: 8 mmHg  LHC done Mid RCA stented. Aspirin , coreg, and pravastatin.   Atopic Dermatitis: Taking Dupixent inject 2-4 weeks, Hydroxyzine 25 mg  one before bed.  Skin is doing well. Itching is well managed.   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 Lupron Depot 3 month IM, NubeQa 300 mg 3 times per week. Sees oncology.   Pulmonary embolus BL and leg dvt occurred in 04/2020. He was s/p covid 19, but his platelets were normal at the time. Metastatic prostate cancer was also discovered at that time. Patient is currently on eliquis 2.5 mg twice daily. Due to high risk for recurrent PE/DVT, continue eliquis.       02/09/2023   10:20 AM 07/21/2022    3:04 PM 02/04/2022   10:38 AM 09/24/2021    9:55 AM 11/27/2020    2:09 PM  Depression screen PHQ 2/9  Decreased Interest 0 0 0 0 0  Down, Depressed, Hopeless 0 0 0 0 0  PHQ - 2 Score 0 0 0 0 0         09/25/2022   10:59 AM 09/25/2022   11:58 AM 12/25/2022   12:51 PM 12/25/2022    1:22 PM 02/09/2023   10:22 AM  Fall Risk  Falls in the past year?     0  Was there an injury with Fall?     0  Fall Risk Category Calculator     0  (RETIRED)  Patient Fall Risk Level Low fall risk Low fall risk Low fall risk Low fall risk   Patient at Risk for Falls Due to     No Fall Risks  Fall risk Follow up     Falls evaluation completed;Education provided      Review of Systems  Current Outpatient Medications on File Prior to Visit  Medication Sig Dispense Refill   amLODipine (NORVASC) 10 MG tablet Take 10 mg by mouth daily.     apixaban (ELIQUIS) 2.5 MG TABS tablet Take 1 tablet (2.5 mg total) by mouth 2 (two) times daily. (Patient not taking: Reported on 02/09/2023) 60 tablet 1   aspirin EC 81 MG tablet Take 81 mg by mouth daily. Swallow whole.     atorvastatin (LIPITOR) 40 MG tablet Take 1 tablet by mouth daily.     busPIRone (BUSPAR) 30 MG tablet Take 1 tablet (30 mg total) by mouth in the morning and at bedtime. 60 tablet 0   carvedilol (COREG) 25 MG tablet take 1 tablet (25 mg) by oral route 2 times per day with food 180 tablet 0   clobetasol cream (TEMOVATE) 0.05 % Apply topically 2 (two) times daily as needed.  clopidogrel (PLAVIX) 75 MG tablet Take 1 tablet by mouth daily.     darolutamide (NUBEQA) 300 MG tablet Take 1 tablet (300 mg total) by mouth daily. Take on MONDAY, WEDNESDAY, AND FRIDAY ONLY 48 tablet 3   Dupilumab 100 MG/0.67ML SOSY Inject into the skin.     eplerenone (INSPRA) 25 MG tablet Take 0.5 tablets (12.5 mg total) by mouth every morning. 15 tablet 2   furosemide (LASIX) 20 MG tablet Take 1 tablet (20 mg total) by mouth daily. 90 tablet 0   hydrOXYzine (ATARAX) 25 MG tablet Take 1 tablet (25 mg total) by mouth every 8 (eight) hours as needed. (Patient not taking: Reported on 02/09/2023) 270 tablet 1   Leuprolide Acetate, 3 Month, (LUPRON DEPOT, 70-MONTH, IM) Inject into the muscle.     lisinopril (ZESTRIL) 20 MG tablet Take 1 tablet (20 mg total) by mouth 2 (two) times daily. 180 tablet 0   loratadine (CLARITIN) 10 MG tablet Take by mouth.     mupirocin ointment (BACTROBAN) 2 % Apply topically 3 (three) times daily.      PARoxetine (PAXIL) 40 MG tablet Take 1 tablet (40 mg total) by mouth daily. 90 tablet 1   pravastatin (PRAVACHOL) 20 MG tablet Take 1 tablet (20 mg total) by mouth daily. 90 tablet 1   Vitamin D, Ergocalciferol, (DRISDOL) 1.25 MG (50000 UNIT) CAPS capsule Take 1 capsule (50,000 Units total) by mouth 2 (two) times a week. 24 capsule 1   No current facility-administered medications on file prior to visit.   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, NYHA class 1 (HCC) 09/21/2018   Dyslipidemia 08/11/2017   Dyspnea on exertion 04/23/2020   Edema extremities 04/14/2018   Elevated glucose 10/17/2019   Fatigue 04/23/2020   Food poisoning    GAD (generalized anxiety disorder)    H/O deep venous thrombosis 04/14/2021   Headache    sinus   History of pulmonary embolism 06/13/2020   History of pulmonary embolus (PE) 06/13/2020   Hypertension    Hypertensive heart disease 08/05/2017   Hypoxia 10/17/2019   Inguinal lymphadenopathy 05/26/2020   LVH (left ventricular hypertrophy) due to hypertensive disease 08/11/2017   Malignant neoplasm of prostate (HCC) 09/09/2020   Mixed hyperlipidemia    Orthostatic hypotension    Orthostatic syncope 10/17/2019   OSA on CPAP    Prostate cancer (HCC)    Pulmonary embolism (HCC) 04/2020   PVC's (premature ventricular contractions) 05/18/2018   Rectal mass 05/24/2020   S/P TKR (total knee replacement) using cement, 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   Solid nodule of lung greater than 8 mm in diameter 05/26/2020   Past Surgical History:  Procedure Laterality Date   CARDIAC CATHETERIZATION  07/17/2022   CATARACT EXTRACTION W/ INTRAOCULAR LENS  IMPLANT, BILATERAL Bilateral    COLON SURGERY  2014   COLOSTOMY REVERSAL  2014   HERNIA REPAIR     x2   JOINT REPLACEMENT     PROSTATE  SURGERY  2006   SEPTOPLASTY     TOTAL KNEE ARTHROPLASTY Left 09/28/2016   Procedure: TOTAL KNEE ARTHROPLASTY;  Surgeon: Dannielle Huh, MD;  Location: [facility] OR;  Service: Orthopedics;  Laterality: Left;    Family History  Problem Relation Age of Onset   Stroke Mother    Parkinson's disease  Mother    Lung disease Father    Stroke Father    Alcoholism Father    Leukemia Sister    Muscular dystrophy Son        Becker's   Alzheimer's disease Neg Hx    Social History   Socioeconomic History   Marital status: Married    Spouse name: Eber JonesCarolyn   Number of children: 2   Years of education: Not on file   Highest education level: High school graduate  Occupational History   Occupation: Merchant navy officerCorporate Recruiter    Comment: ICDs/Wound Care  Tobacco Use   Smoking status: Former    Years: 20    Types: Cigarettes    Quit date: 1979    Years since quitting: 45.2   Smokeless tobacco: Never  Vaping Use   Vaping Use: Never used  Substance and Sexual Activity   Alcohol use: No   Drug use: Never   Sexual activity: Not on file  Other Topics Concern   Not on file  Social History Narrative   LIves at home with his wife   [place], [place]   Right handed   Drinks about 2 cups of caffeine daily   Social Determinants of Health   Financial Resource Strain: Low Risk  (03/18/2023)   Overall Financial Resource Strain (CARDIA)    Difficulty of Paying Living Expenses: Not hard at all  Food Insecurity: No Food Insecurity (03/18/2023)   Hunger Vital Sign    Worried About Running Out of Food in the Last Year: Never true    Ran Out of Food in the Last Year: Never true  Transportation Needs: Unknown (03/18/2023)   PRAPARE - Administrator, Civil ServiceTransportation    Lack of Transportation (Medical): Patient declined    Lack of Transportation (Non-Medical): No  Physical Activity: Inactive (03/18/2023)   Exercise Vital Sign    Days of Exercise per Week: 0 days    Minutes of Exercise per Session: 0  min  Stress: No Stress Concern Present (03/18/2023)   [employer] - Occupational Stress Questionnaire    Feeling of Stress : Not at all  Social Connections: Socially Integrated (03/18/2023)   Social Connection and Isolation Panel [NHANES]    Frequency of Communication with Friends and Family: More than three times a week    Frequency of Social Gatherings with Friends and Family: Once a week    Attends Religious Services: More than 4 times per year    Active Member of [employer]ubs or Organizations: No    Attends Engineer, structuralClub or Organization Meetings: More than 4 times per year    Marital Status: Married    Objective:  There were no vitals taken for this visit.     12/25/2022    1:22 PM 12/25/2022   12:51 PM 12/16/2022   11:10 AM  BP/Weight  Systolic BP 148 151 128  Diastolic BP 58 71 72  Wt. (Lbs) 196.08 196.2   BMI 28.13 kg/m2 28.15 kg/m2     Physical Exam  Diabetic Foot Exam - Simple   No data filed      Lab Results  Component Value Date   WBC 8.2 12/16/2022   HGB 14.4 12/16/2022   HCT 43.6 12/16/2022   PLT 306 12/16/2022   GLUCOSE 85 12/16/2022   CHOL 128 12/16/2022   TRIG 62 12/16/2022   HDL 55 12/16/2022   LDLCALC 60 12/16/2022   ALT 14 12/16/2022   AST 21 12/16/2022  NA 143 12/16/2022   K 4.4 12/16/2022   CL 105 12/16/2022   CREATININE 1.05 12/16/2022   BUN 23 12/16/2022   CO2 23 12/16/2022   TSH 1.260 01/23/2022   INR 1.03 01/09/2019   HGBA1C 5.6 12/01/2017      Assessment & Plan:    Complex sleep apnea syndrome  Congestive heart failure with LV diastolic dysfunction, NYHA class 1  GAD (generalized anxiety disorder)  Hypertensive heart disease with congestive heart failure, unspecified heart failure type  Mixed hyperlipidemia  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   No orders of the defined types were placed in this encounter.    Follow-up: No  follow-ups on file.   I,Dorion Petillo,acting as a Neurosurgeon for Blane Ohara, MD.,have documented all relevant documentation on the behalf of Blane Ohara, MD,as directed by  Blane Ohara, MD while in the presence of Blane Ohara, MD.   An After Visit Summary was printed and given to the patient.  Blane Ohara, MD [facility] [phone]

## 2023-03-22 ENCOUNTER — Encounter: Payer: Self-pay | Admitting: Family Medicine

## 2023-03-22 ENCOUNTER — Ambulatory Visit (INDEPENDENT_AMBULATORY_CARE_PROVIDER_SITE_OTHER): Payer: Medicare Other | Admitting: Family Medicine

## 2023-03-22 VITALS — BP 110/70 | HR 68 | Temp 96.9°F | Resp 18 | Ht 70.0 in | Wt 196.0 lb

## 2023-03-22 DIAGNOSIS — G4731 Primary central sleep apnea: Secondary | ICD-10-CM | POA: Diagnosis not present

## 2023-03-22 DIAGNOSIS — E782 Mixed hyperlipidemia: Secondary | ICD-10-CM

## 2023-03-22 DIAGNOSIS — G301 Alzheimer's disease with late onset: Secondary | ICD-10-CM

## 2023-03-22 DIAGNOSIS — C7989 Secondary malignant neoplasm of other specified sites: Secondary | ICD-10-CM | POA: Diagnosis not present

## 2023-03-22 DIAGNOSIS — F02B3 Dementia in other diseases classified elsewhere, moderate, with mood disturbance: Secondary | ICD-10-CM | POA: Diagnosis not present

## 2023-03-22 DIAGNOSIS — C61 Malignant neoplasm of prostate: Secondary | ICD-10-CM

## 2023-03-22 DIAGNOSIS — I110 Hypertensive heart disease with heart failure: Secondary | ICD-10-CM | POA: Diagnosis not present

## 2023-03-22 DIAGNOSIS — I5030 Unspecified diastolic (congestive) heart failure: Secondary | ICD-10-CM | POA: Diagnosis not present

## 2023-03-22 DIAGNOSIS — G4739 Other sleep apnea: Secondary | ICD-10-CM

## 2023-03-22 DIAGNOSIS — R2689 Other abnormalities of gait and mobility: Secondary | ICD-10-CM | POA: Diagnosis not present

## 2023-03-22 DIAGNOSIS — F411 Generalized anxiety disorder: Secondary | ICD-10-CM | POA: Diagnosis not present

## 2023-03-22 MED ORDER — EPLERENONE 25 MG PO TABS: 12.5000 mg | ORAL_TABLET | ORAL | 1 refills | Status: DC

## 2023-03-22 MED ORDER — EPLERENONE 25 MG PO TABS: 25.0000 mg | ORAL_TABLET | Freq: Every day | ORAL | 1 refills | Status: DC

## 2023-03-22 NOTE — Assessment & Plan Note (Signed)
Management per specialist. 

## 2023-03-22 NOTE — Assessment & Plan Note (Addendum)
Continue Amlodipine 5 mg twice daily. Lisinopril 20 mg twice daily, lasix 20 mg daily, eplerenone 25 mg once daily, and Carvedilol 25 mg twice daily.

## 2023-03-22 NOTE — Assessment & Plan Note (Signed)
Patient has tried physical therapy, but does not continue to do exercises.

## 2023-03-22 NOTE — Assessment & Plan Note (Signed)
Compliant and benefiting from cpap.

## 2023-03-22 NOTE — Assessment & Plan Note (Addendum)
Continue Buspar 30 mg twice daily. Paroxetine 40 mg daily.

## 2023-03-22 NOTE — Assessment & Plan Note (Signed)
Continue carvedilol 25 mg three times a day, lisinopril 20 mg twice daily, furosemide 20 mg daily, and eplerenone 25 mg daily.

## 2023-03-22 NOTE — Assessment & Plan Note (Addendum)
Memantine 5 mg daily. Patient may call us to increase dose as per patient they had difficulty reaching and getting a response from neurology.

## 2023-03-22 NOTE — Assessment & Plan Note (Addendum)
Continue Pravastatin 20 mg daily.   Recommend continue to work on eating healthy diet and exercise. Check lipid panel.

## 2023-03-23 ENCOUNTER — Telehealth: Payer: Self-pay

## 2023-03-23 LAB — CBC WITH DIFFERENTIAL/PLATELET
Basophils Absolute: 0 10*3/uL (ref 0.0–0.2)
Basos: 1 %
EOS (ABSOLUTE): 0.6 10*3/uL — ABNORMAL HIGH (ref 0.0–0.4)
Eos: 7 %
Hematocrit: 43.1 % (ref 37.5–51.0)
Hemoglobin: 14.6 g/dL (ref 13.0–17.7)
Immature Grans (Abs): 0.1 10*3/uL (ref 0.0–0.1)
Immature Granulocytes: 1 %
Lymphocytes Absolute: 1.8 10*3/uL (ref 0.7–3.1)
Lymphs: 21 %
MCH: 29.9 pg (ref 26.6–33.0)
MCHC: 33.9 g/dL (ref 31.5–35.7)
MCV: 88 fL (ref 79–97)
Monocytes Absolute: 0.7 10*3/uL (ref 0.1–0.9)
Monocytes: 8 %
Neutrophils Absolute: 5.6 10*3/uL (ref 1.4–7.0)
Neutrophils: 62 %
Platelets: 310 10*3/uL (ref 150–450)
RBC: 4.88 x10E6/uL (ref 4.14–5.80)
RDW: 13 % (ref 11.6–15.4)
WBC: 8.8 10*3/uL (ref 3.4–10.8)

## 2023-03-23 LAB — CARDIOVASCULAR RISK ASSESSMENT

## 2023-03-23 LAB — COMPREHENSIVE METABOLIC PANEL
ALT: 14 IU/L (ref 0–44)
AST: 22 IU/L (ref 0–40)
Albumin/Globulin Ratio: 1.8 (ref 1.2–2.2)
Albumin: 4.4 g/dL (ref 3.7–4.7)
Alkaline Phosphatase: 115 IU/L (ref 44–121)
BUN/Creatinine Ratio: 16 (ref 10–24)
BUN: 23 mg/dL (ref 8–27)
Bilirubin Total: 0.5 mg/dL (ref 0.0–1.2)
CO2: 19 mmol/L — ABNORMAL LOW (ref 20–29)
Calcium: 9.5 mg/dL (ref 8.6–10.2)
Chloride: 105 mmol/L (ref 96–106)
Creatinine, Ser: 1.4 mg/dL — ABNORMAL HIGH (ref 0.76–1.27)
Globulin, Total: 2.5 g/dL (ref 1.5–4.5)
Glucose: 96 mg/dL (ref 70–99)
Potassium: 4.6 mmol/L (ref 3.5–5.2)
Sodium: 143 mmol/L (ref 134–144)
Total Protein: 6.9 g/dL (ref 6.0–8.5)
eGFR: 50 mL/min/{1.73_m2} — ABNORMAL LOW (ref >59)

## 2023-03-23 LAB — LIPID PANEL
Chol/HDL Ratio: 2.7 ratio (ref 0.0–5.0)
Cholesterol, Total: 136 mg/dL (ref 100–199)
HDL: 50 mg/dL (ref >39)
LDL Chol Calc (NIH): 70 mg/dL (ref 0–99)
Triglycerides: 85 mg/dL (ref 0–149)
VLDL Cholesterol Cal: 16 mg/dL (ref 5–40)

## 2023-03-23 NOTE — Progress Notes (Signed)
Care Management & Coordination Services Pharmacy Team  Reason for Encounter: General adherence update   Contacted patient for general health update and medication adherence call.  {US HC Outreach:28874}   What concerns do you have about your medications?  The patient {denies/reports:25180} side effects with their medications.   How often do you forget or accidentally miss a dose? {missed doses:25554}  Do you use a pillbox? {yes/no:20286}  Are you having any problems getting your medications from your pharmacy? {yes/no:20286}  Has the cost of your medications been a concern? {yes/no:20286} If yes, what medication and is patient assistance available or has it been applied for?  Since last visit with PharmD, {no/thefollowing:25210} interventions have been made.   The patient has not had an ED visit since last contact.   The patient {denies/reports:25180} problems with their health.   Patient {denies/reports:25180} concerns or questions for ***, PharmD at this time.   Counseled patient on: {GENERALCOUNSELING:28686}   Chart Updates: Recent office visits:  03-22-2023 Blane Ohara, MD. EOS (ABSOLUTE)= 0.6. Creatinine= 1.40, eGFR= 50, CO2= 19. Increase Inspra 12.5 mg daily to 25 mg daily. Patient reported not taking eliquis.  02-09-2023 Jacklynn Bue, LPN. Medicare wellness visit  Recent consult visits:  03-18-2023 Iven Finn, MD (Neurology). Stop donepezil.  03-09-2023 Iven Finn (Neurology). Unable to view encounter.  02-12-2023 Iven Finn (Neurology). MRI Brain W/o contrast completed.  02-03-2023 Iven Finn, MD (Neurology). Initial consult for unsteady gait.  01-28-2023 Letta Moynahan, NP (Cardiology). Unable to view encounter.  Hospital visits:  None in previous 6 months  Medications: Outpatient Encounter Medications as of 03/23/2023  Medication Sig   amLODipine (NORVASC) 10 MG tablet Take 10 mg by mouth daily.   aspirin EC  81 MG tablet Take 81 mg by mouth daily. Swallow whole.   busPIRone (BUSPAR) 30 MG tablet Take 1 tablet (30 mg total) by mouth in the morning and at bedtime.   carvedilol (COREG) 25 MG tablet take 1 tablet (25 mg) by oral route 2 times per day with food   clobetasol cream (TEMOVATE) 0.05 % Apply topically 2 (two) times daily as needed. (Patient not taking: Reported on 03/22/2023)   clopidogrel (PLAVIX) 75 MG tablet Take 1 tablet by mouth daily.   darolutamide (NUBEQA) 300 MG tablet Take 1 tablet (300 mg total) by mouth daily. Take on MONDAY, WEDNESDAY, AND FRIDAY ONLY   Dupilumab 100 MG/0.67ML SOSY Inject into the skin.   eplerenone (INSPRA) 25 MG tablet Take 1 tablet (25 mg total) by mouth daily.   furosemide (LASIX) 20 MG tablet Take 1 tablet (20 mg total) by mouth daily.   hydrOXYzine (ATARAX) 25 MG tablet Take 1 tablet (25 mg total) by mouth every 8 (eight) hours as needed.   Leuprolide Acetate, 3 Month, (LUPRON DEPOT, 19-MONTH, IM) Inject into the muscle.   lisinopril (ZESTRIL) 20 MG tablet Take 1 tablet (20 mg total) by mouth 2 (two) times daily.   loratadine (CLARITIN) 10 MG tablet Take by mouth.   memantine (NAMENDA) 5 MG tablet Take 5 mg by mouth daily.   mupirocin ointment (BACTROBAN) 2 % Apply topically 3 (three) times daily. (Patient not taking: Reported on 03/22/2023)   PARoxetine (PAXIL) 40 MG tablet Take 1 tablet (40 mg total) by mouth daily.   pravastatin (PRAVACHOL) 20 MG tablet Take 1 tablet (20 mg total) by mouth daily.   Vitamin D, Ergocalciferol, (DRISDOL) 1.25 MG (50000 UNIT) CAPS capsule Take 1 capsule (50,000 Units total) by mouth  2 (two) times a week.   No facility-administered encounter medications on file as of 03/23/2023.    Recent vitals BP Readings from Last 3 Encounters:  03/22/23 110/70  12/25/22 (!) 148/58  12/25/22 (!) 151/71   Pulse Readings from Last 3 Encounters:  03/22/23 68  12/25/22 61  12/25/22 (!) 55   Wt Readings from Last 3 Encounters:  03/22/23  196 lb (88.9 kg)  12/25/22 196 lb 1.3 oz (88.9 kg)  12/25/22 196 lb 3.2 oz (89 kg)   BMI Readings from Last 3 Encounters:  03/22/23 28.12 kg/m  12/25/22 28.13 kg/m  12/25/22 28.15 kg/m    Recent lab results    Component Value Date/Time   NA 143 03/22/2023 1125   K 4.6 03/22/2023 1125   CL 105 03/22/2023 1125   CO2 19 (L) 03/22/2023 1125   GLUCOSE 96 03/22/2023 1125   GLUCOSE 100 (H) 01/09/2019 2020   BUN 23 03/22/2023 1125   CREATININE 1.40 (H) 03/22/2023 1125   CALCIUM 9.5 03/22/2023 1125    Lab Results  Component Value Date   CREATININE 1.40 (H) 03/22/2023   EGFR 50 (L) 03/22/2023   GFRNONAA 67 01/07/2021   GFRAA 77 01/07/2021   Lab Results  Component Value Date/Time   HGBA1C 5.6 12/01/2017 12:49 PM    Lab Results  Component Value Date   CHOL 136 03/22/2023   HDL 50 03/22/2023   LDLCALC 70 03/22/2023   TRIG 85 03/22/2023   CHOLHDL 2.7 03/22/2023  03-23-2023: 1st attempt left VM  Care Gaps: Annual wellness visit in last year? Yes Covid booster overdue  Star Rating Drugs:  Pravastatin 20 mg- Last filled 02-26-2023 90 DS. Previous 12-09-2022 90 DS. Lisinopril 20 mg- Last filled 12-31-2022 90 DS. Previous 10-06-2022 90 DS.  Huey Romans [facility] Clinical Pharmacist Assistant [phone]

## 2023-03-25 ENCOUNTER — Inpatient Hospital Stay: Payer: Medicare Other | Attending: Oncology

## 2023-03-25 DIAGNOSIS — Z5111 Encounter for antineoplastic chemotherapy: Secondary | ICD-10-CM | POA: Insufficient documentation

## 2023-03-25 DIAGNOSIS — L209 Atopic dermatitis, unspecified: Secondary | ICD-10-CM | POA: Diagnosis not present

## 2023-03-25 DIAGNOSIS — C774 Secondary and unspecified malignant neoplasm of inguinal and lower limb lymph nodes: Secondary | ICD-10-CM | POA: Insufficient documentation

## 2023-03-25 DIAGNOSIS — C61 Malignant neoplasm of prostate: Secondary | ICD-10-CM | POA: Diagnosis not present

## 2023-03-25 LAB — PSA: Prostatic Specific Antigen: 0.06 ng/mL (ref 0.00–4.00)

## 2023-03-25 NOTE — Progress Notes (Signed)
[facility]  [address] [place],  [place]  [address] [phone]  Clinic Day:  03/26/2023  Referring physician: Blane Ohara, MD   HISTORY OF PRESENT ILLNESS:  The patient is an 82 y.o. 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  He is currently on complete androgen deprivation therapy, which consists of Lupron/darolutamide.  As it pertains to his prostate cancer management, he denies having any side effects from his complete androgen blockade therapy.  The patient claims he is still taking his darolutamide just 3 times per week.  He denies having any new symptoms or findings that concern him for progression of his metastatic prostate cancer.    PHYSICAL EXAM:  Blood pressure 125/62, pulse 64, temperature 98.6 F (37 C), resp. rate 16, height 5\' 10"  (1.778 m), weight 196 lb 12.8 oz (89.3 kg), SpO2 93 %. Wt Readings from Last 3 Encounters:  03/26/23 191 lb 0.6 oz (86.7 kg)  03/26/23 196 lb 12.8 oz (89.3 kg)  03/22/23 196 lb (88.9 kg)   Body mass index is 28.24 kg/m. Performance status (ECOG): 1 Physical Exam Constitutional:      General: He is not in acute distress.    Appearance: Normal appearance. He is normal weight.  HENT:     Head: Normocephalic and atraumatic.  Eyes:     General: No scleral icterus.    Extraocular Movements: Extraocular movements intact.     Conjunctiva/sclera: Conjunctivae normal.     Pupils: Pupils are equal, round, and reactive to light.  Cardiovascular:     Rate and Rhythm: Normal rate and regular rhythm.     Pulses: Normal pulses.     Heart sounds: Normal heart sounds. No murmur heard.    No friction rub. No gallop.  Pulmonary:     Effort: Pulmonary effort is normal. No respiratory distress.     Breath sounds: Normal breath sounds.  Abdominal:     General: Bowel sounds are normal. There is no distension.     Palpations: Abdomen is  soft. There is no hepatomegaly, splenomegaly or mass.     Tenderness: There is no abdominal tenderness.  Musculoskeletal:        General: Normal range of motion.     Cervical back: Normal range of motion and neck supple.     Right lower leg: No edema.     Left lower leg: No edema.  Lymphadenopathy:     Cervical: No cervical adenopathy.  Skin:    General: Skin is warm and dry.  Neurological:     General: No focal deficit present.     Mental Status: He is alert and oriented to person, place, and time. Mental status is at baseline.  Psychiatric:        Mood and Affect: Mood normal.        Behavior: Behavior normal.        Thought Content: Thought content normal.        Judgment: Judgment normal.    LABS:    Latest Reference Range & Units 12/23/22 10:27 03/25/23 11:06  Prostatic Specific Antigen 0.00 - 4.00 ng/mL 0.04 0.06   ASSESSMENT & PLAN:  Assessment/Plan:  An 82 y.o. male with metastatic prostate cancer.  His PSA level of .06 is still consistent with undetectable disease, which is when the level is < 0.1.  Based upon this, he will continue to take darolutamide 300 mg 3  times per week.  He will continue Lupron shots every 3 months.  I will see him back in 3 months for repeat clinical assessment. The patient understands all the plans discussed today and is in agreement with them.    Caleigha Zale Kirby Funk, MD

## 2023-03-26 ENCOUNTER — Inpatient Hospital Stay (INDEPENDENT_AMBULATORY_CARE_PROVIDER_SITE_OTHER): Payer: Medicare Other | Admitting: Oncology

## 2023-03-26 ENCOUNTER — Other Ambulatory Visit: Payer: Self-pay | Admitting: Oncology

## 2023-03-26 ENCOUNTER — Inpatient Hospital Stay: Payer: Medicare Other

## 2023-03-26 VITALS — BP 145/68 | HR 68 | Temp 97.6°F | Resp 16 | Ht 70.0 in | Wt 191.0 lb

## 2023-03-26 VITALS — BP 125/62 | HR 64 | Temp 98.6°F | Resp 16 | Ht 70.0 in | Wt 196.8 lb

## 2023-03-26 DIAGNOSIS — C61 Malignant neoplasm of prostate: Secondary | ICD-10-CM

## 2023-03-26 DIAGNOSIS — Z5111 Encounter for antineoplastic chemotherapy: Secondary | ICD-10-CM | POA: Diagnosis not present

## 2023-03-26 DIAGNOSIS — C774 Secondary and unspecified malignant neoplasm of inguinal and lower limb lymph nodes: Secondary | ICD-10-CM | POA: Diagnosis not present

## 2023-03-26 DIAGNOSIS — C7989 Secondary malignant neoplasm of other specified sites: Secondary | ICD-10-CM

## 2023-03-26 MED ORDER — LEUPROLIDE ACETATE (3 MONTH) 22.5 MG IM KIT
22.5000 mg | PACK | Freq: Once | INTRAMUSCULAR | Status: AC
  Administered 2023-03-26: 22.5 mg via INTRAMUSCULAR
  Filled 2023-03-26: qty 22.5

## 2023-03-26 NOTE — Patient Instructions (Signed)
Leuprolide Solution for Injection What is this medication? LEUPROLIDE (loo PROE lide) reduces the symptoms of prostate cancer. It works by decreasing levels of the hormone testosterone in the body. This prevents prostate cancer cells from spreading or growing. This medicine may be used for other purposes; ask your health care provider or pharmacist if you have questions. COMMON BRAND NAME(S): Lupron What should I tell my care team before I take this medication? They need to know if you have any of these conditions: Diabetes Heart attack Heart disease High blood pressure High cholesterol Pain or difficulty passing urine Spinal cord metastasis Stroke Tobacco use An unusual or allergic reaction to leuprolide, other medications, foods, dyes, or preservatives Pregnant or trying to get pregnant Breast-feeding How should I use this medication? This medication is for injection under the skin or into a muscle. You will be taught how to prepare and give this medication. Use exactly as directed. Take your medication at regular intervals. Do not take it more often than directed. It is important that you put your used needles and syringes in a special sharps container. Do not put them in a trash can. If you do not have a sharps container, call your care team to get one. A special MedGuide will be given to you by the pharmacist with each prescription and refill. Be sure to read this information carefully each time. Talk to your care team about the use of this medication in children. While this medication may be prescribed for children as young as 8 years for selected conditions, precautions do apply. Overdosage: If you think you have taken too much of this medicine contact a poison control center or emergency room at once. NOTE: This medicine is only for you. Do not share this medicine with others. What if I miss a dose? If you miss a dose, take it as soon as you can. If it is almost time for your next  dose, take only that dose. Do not take double or extra doses. What may interact with this medication? Do not take this medication with any of the following: Chasteberry Cisapride Dronedarone Pimozide Thioridazine This medication may also interact with the following: Estrogen or progestin hormones Herbal or dietary supplements, like black cohosh or DHEA Other medications that cause heart rhythm changes Testosterone This list may not describe all possible interactions. Give your health care provider a list of all the medicines, herbs, non-prescription drugs, or dietary supplements you use. Also tell them if you smoke, drink alcohol, or use illegal drugs. Some items may interact with your medicine. What should I watch for while using this medication? Visit your care team for regular checks on your progress. During the first week, your symptoms may get worse, but then will improve as you continue your treatment. You may get hot flashes, increased bone pain, increased difficulty passing urine, or an aggravation of nerve symptoms. Discuss these effects with your care team, some of them may improve with continued use of this medication. Patients may experience a menstrual cycle or spotting during the first 2 months of therapy with this medication. If this continues, contact your care team. This medication may increase blood sugar. The risk may be higher in patients who already have diabetes. Ask your care team what you can do to lower your risk of diabetes while taking this medication. What side effects may I notice from receiving this medication? Side effects that you should report to your care team as soon as possible: Allergic reactions--skin rash,   itching, hives, swelling of the face, lips, tongue, or throat Heart attack--pain or tightness in the chest, shoulders, arms, or jaw, nausea, shortness of breath, cold or clammy skin, feeling faint or lightheaded Heart rhythm changes--fast or irregular  heartbeat, dizziness, feeling faint or lightheaded, chest pain, trouble breathing High blood sugar (hyperglycemia)--increased thirst or amount of urine, unusual weakness or fatigue, blurry vision Mood swings, irritability, hostility Seizures Stroke--sudden numbness or weakness of the face, arm, or leg, trouble speaking, confusion, trouble walking, loss of balance or coordination, dizziness, severe headache, change in vision Thoughts of suicide or self-harm, worsening mood, feelings of depression Side effects that usually do not require medical attention (report to your care team if they continue or are bothersome): Bone pain Change in sex drive or performance General discomfort and fatigue Hot flashes Muscle pain Pain, redness, or irritation at injection site Swelling of the ankles, hands, or feet This list may not describe all possible side effects. Call your doctor for medical advice about side effects. You may report side effects to FDA at 1-800-FDA-1088. Where should I keep my medication? Keep out of the reach of children and pets. Store below 25 degrees C (77 degrees F). Do not freeze. Protect from light. Get rid of any unused medication after the expiration date. To get rid of medications that are no longer needed or have expired: Take the medication to a medication take-back program. Check with your pharmacy or law enforcement to find a location. If you cannot return the medication, ask your pharmacist or care team how to get rid of this medication safely. NOTE: This sheet is a summary. It may not cover all possible information. If you have questions about this medicine, talk to your doctor, pharmacist, or health care provider.  2023 Elsevier/Gold Standard (2022-01-15 00:00:00)  

## 2023-03-29 ENCOUNTER — Other Ambulatory Visit: Payer: BLUE CROSS/BLUE SHIELD

## 2023-03-29 DIAGNOSIS — I110 Hypertensive heart disease with heart failure: Secondary | ICD-10-CM | POA: Diagnosis not present

## 2023-03-29 LAB — COMPREHENSIVE METABOLIC PANEL
ALT: 16 IU/L (ref 0–44)
AST: 15 IU/L (ref 0–40)
Albumin/Globulin Ratio: 2 (ref 1.2–2.2)
Albumin: 4 g/dL (ref 3.7–4.7)
Alkaline Phosphatase: 106 IU/L (ref 44–121)
BUN/Creatinine Ratio: 23 (ref 10–24)
BUN: 30 mg/dL — ABNORMAL HIGH (ref 8–27)
Bilirubin Total: 0.3 mg/dL (ref 0.0–1.2)
CO2: 20 mmol/L (ref 20–29)
Calcium: 9.1 mg/dL (ref 8.6–10.2)
Chloride: 106 mmol/L (ref 96–106)
Creatinine, Ser: 1.31 mg/dL — ABNORMAL HIGH (ref 0.76–1.27)
Globulin, Total: 2 g/dL (ref 1.5–4.5)
Glucose: 100 mg/dL — ABNORMAL HIGH (ref 70–99)
Potassium: 4.4 mmol/L (ref 3.5–5.2)
Sodium: 141 mmol/L (ref 134–144)
Total Protein: 6 g/dL (ref 6.0–8.5)
eGFR: 54 mL/min/{1.73_m2} — ABNORMAL LOW (ref >59)

## 2023-03-30 ENCOUNTER — Telehealth: Payer: Self-pay

## 2023-03-30 NOTE — Telephone Encounter (Signed)
Patient came for lab work and stopped me and wanted me to send a message and ask you who is the neurologist that you recommend that the patient go to see ? Please advise.

## 2023-04-01 NOTE — Telephone Encounter (Signed)
Called patient and left a message to call office back.  

## 2023-04-02 ENCOUNTER — Other Ambulatory Visit: Payer: Self-pay | Admitting: Family Medicine

## 2023-04-06 ENCOUNTER — Other Ambulatory Visit: Payer: Self-pay

## 2023-04-06 DIAGNOSIS — G301 Alzheimer's disease with late onset: Secondary | ICD-10-CM

## 2023-04-06 NOTE — Telephone Encounter (Signed)
Patient came in office today, agree to referral sent to [facility]. Information was given to patient and told to call if he don't hear from them in a week. Referral sent to [facility]

## 2023-04-07 DIAGNOSIS — R5381 Other malaise: Secondary | ICD-10-CM | POA: Diagnosis not present

## 2023-04-07 DIAGNOSIS — Z955 Presence of coronary angioplasty implant and graft: Secondary | ICD-10-CM | POA: Diagnosis not present

## 2023-04-07 DIAGNOSIS — Z86718 Personal history of other venous thrombosis and embolism: Secondary | ICD-10-CM | POA: Diagnosis not present

## 2023-04-07 DIAGNOSIS — I10 Essential (primary) hypertension: Secondary | ICD-10-CM | POA: Diagnosis not present

## 2023-04-07 DIAGNOSIS — Z7901 Long term (current) use of anticoagulants: Secondary | ICD-10-CM | POA: Diagnosis not present

## 2023-04-07 DIAGNOSIS — R5383 Other fatigue: Secondary | ICD-10-CM | POA: Diagnosis not present

## 2023-04-07 DIAGNOSIS — I2510 Atherosclerotic heart disease of native coronary artery without angina pectoris: Secondary | ICD-10-CM | POA: Diagnosis not present

## 2023-04-28 ENCOUNTER — Other Ambulatory Visit: Payer: Self-pay | Admitting: Family Medicine

## 2023-05-17 ENCOUNTER — Encounter: Payer: Self-pay | Admitting: Oncology

## 2023-06-02 ENCOUNTER — Other Ambulatory Visit: Payer: Self-pay | Admitting: Physician Assistant

## 2023-06-02 DIAGNOSIS — F411 Generalized anxiety disorder: Secondary | ICD-10-CM

## 2023-06-04 ENCOUNTER — Other Ambulatory Visit: Payer: Self-pay | Admitting: Family Medicine

## 2023-06-24 ENCOUNTER — Inpatient Hospital Stay: Payer: Medicare Other | Attending: Oncology

## 2023-06-24 ENCOUNTER — Encounter: Payer: Self-pay | Admitting: Oncology

## 2023-06-24 DIAGNOSIS — C61 Malignant neoplasm of prostate: Secondary | ICD-10-CM | POA: Insufficient documentation

## 2023-06-24 DIAGNOSIS — C774 Secondary and unspecified malignant neoplasm of inguinal and lower limb lymph nodes: Secondary | ICD-10-CM | POA: Diagnosis not present

## 2023-06-24 DIAGNOSIS — Z5111 Encounter for antineoplastic chemotherapy: Secondary | ICD-10-CM | POA: Insufficient documentation

## 2023-06-24 LAB — PSA: Prostatic Specific Antigen: 0.05 ng/mL (ref 0.00–4.00)

## 2023-06-24 NOTE — Addendum Note (Signed)
Addended by: Domenic Schwab on: 06/24/2023 02:24 PM   Modules accepted: Orders

## 2023-06-25 ENCOUNTER — Inpatient Hospital Stay (INDEPENDENT_AMBULATORY_CARE_PROVIDER_SITE_OTHER): Payer: Medicare Other | Admitting: Oncology

## 2023-06-25 ENCOUNTER — Inpatient Hospital Stay: Payer: Medicare Other

## 2023-06-25 VITALS — BP 149/71 | HR 59 | Temp 98.6°F | Resp 16 | Ht 70.0 in | Wt 193.5 lb

## 2023-06-25 VITALS — BP 147/75 | HR 59 | Temp 99.0°F | Resp 16 | Ht 70.0 in | Wt 192.0 lb

## 2023-06-25 DIAGNOSIS — C61 Malignant neoplasm of prostate: Secondary | ICD-10-CM

## 2023-06-25 DIAGNOSIS — Z5111 Encounter for antineoplastic chemotherapy: Secondary | ICD-10-CM | POA: Diagnosis not present

## 2023-06-25 DIAGNOSIS — C774 Secondary and unspecified malignant neoplasm of inguinal and lower limb lymph nodes: Secondary | ICD-10-CM | POA: Diagnosis not present

## 2023-06-25 DIAGNOSIS — C7989 Secondary malignant neoplasm of other specified sites: Secondary | ICD-10-CM

## 2023-06-25 MED ORDER — LEUPROLIDE ACETATE (3 MONTH) 22.5 MG IM KIT
22.5000 mg | PACK | Freq: Once | INTRAMUSCULAR | Status: AC
  Administered 2023-06-25: 22.5 mg via INTRAMUSCULAR
  Filled 2023-06-25: qty 22.5

## 2023-06-25 NOTE — Progress Notes (Signed)
[facility] In[facility]  [address] [place],  [place]  [address] [phone]  Clinic Day:  06/25/2023  Referring physician: Blane Ohara, MD   HISTORY OF PRESENT ILLNESS:  The patient is an 82 y.o. 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  He has been on complete androgen deprivation therapy for months, which consists of Lupron/darolutamide.  As it pertains to his prostate cancer management, he denies having any side effects from his complete androgen blockade therapy.  He takes darolutamide just 3 times per week.  He denies having any new symptoms or findings that concern him for progression of his metastatic prostate cancer.    PHYSICAL EXAM:  Blood pressure (!) 149/71, pulse (!) 59, temperature 98.6 F (37 C), resp. rate 16, height 5\' 10"  (1.778 m), weight 193 lb 8 oz (87.8 kg), SpO2 96%. Wt Readings from Last 3 Encounters:  06/25/23 192 lb 0.6 oz (87.1 kg)  06/25/23 193 lb 8 oz (87.8 kg)  03/26/23 191 lb 0.6 oz (86.7 kg)   Body mass index is 27.76 kg/m. Performance status (ECOG): 1 Physical Exam Constitutional:      General: He is not in acute distress.    Appearance: Normal appearance. He is normal weight.  HENT:     Head: Normocephalic and atraumatic.  Eyes:     General: No scleral icterus.    Extraocular Movements: Extraocular movements intact.     Conjunctiva/sclera: Conjunctivae normal.     Pupils: Pupils are equal, round, and reactive to light.  Cardiovascular:     Rate and Rhythm: Normal rate and regular rhythm.     Pulses: Normal pulses.     Heart sounds: Normal heart sounds. No murmur heard.    No friction rub. No gallop.  Pulmonary:     Effort: Pulmonary effort is normal. No respiratory distress.     Breath sounds: Normal breath sounds.  Abdominal:     General: Bowel sounds are normal. There is no distension.     Palpations: Abdomen is soft. There is no  hepatomegaly, splenomegaly or mass.     Tenderness: There is no abdominal tenderness.  Musculoskeletal:        General: Normal range of motion.     Cervical back: Normal range of motion and neck supple.     Right lower leg: No edema.     Left lower leg: No edema.  Lymphadenopathy:     Cervical: No cervical adenopathy.  Skin:    General: Skin is warm and dry.  Neurological:     General: No focal deficit present.     Mental Status: He is alert and oriented to person, place, and time. Mental status is at baseline.  Psychiatric:        Mood and Affect: Mood normal.        Behavior: Behavior normal.        Thought Content: Thought content normal.        Judgment: Judgment normal.    LABS:    Latest Reference Range & Units 03/25/23 11:06 06/24/23 10:59  Prostatic Specific Antigen 0.00 - 4.00 ng/mL 0.06 0.05   ASSESSMENT & PLAN:  Assessment/Plan:  An 82 y.o. male with metastatic prostate cancer.  His PSA level of .05 is still consistent with undetectable disease, which is when the level is < 0.1.  Based upon this, he will continue to take darolutamide 300 mg 3 times  per week.  He will continue Lupron shots every 3 months, including his next shot today.  I will see him back in 3 months for repeat clinical assessment. The patient understands all the plans discussed today and is in agreement with them.    Kwame Ryland Kirby Funk, MD

## 2023-07-03 ENCOUNTER — Other Ambulatory Visit: Payer: Self-pay | Admitting: Family Medicine

## 2023-07-03 ENCOUNTER — Other Ambulatory Visit: Payer: Self-pay | Admitting: Physician Assistant

## 2023-07-03 DIAGNOSIS — F411 Generalized anxiety disorder: Secondary | ICD-10-CM

## 2023-07-09 ENCOUNTER — Ambulatory Visit (INDEPENDENT_AMBULATORY_CARE_PROVIDER_SITE_OTHER): Payer: Medicare Other | Admitting: Family Medicine

## 2023-07-09 VITALS — BP 124/62 | HR 60 | Temp 98.2°F | Resp 16 | Ht 70.0 in | Wt 192.0 lb

## 2023-07-09 DIAGNOSIS — C7989 Secondary malignant neoplasm of other specified sites: Secondary | ICD-10-CM | POA: Diagnosis not present

## 2023-07-09 DIAGNOSIS — C61 Malignant neoplasm of prostate: Secondary | ICD-10-CM | POA: Diagnosis not present

## 2023-07-09 DIAGNOSIS — F02B3 Dementia in other diseases classified elsewhere, moderate, with mood disturbance: Secondary | ICD-10-CM

## 2023-07-09 DIAGNOSIS — G4733 Obstructive sleep apnea (adult) (pediatric): Secondary | ICD-10-CM | POA: Diagnosis not present

## 2023-07-09 DIAGNOSIS — I110 Hypertensive heart disease with heart failure: Secondary | ICD-10-CM | POA: Diagnosis not present

## 2023-07-09 DIAGNOSIS — E782 Mixed hyperlipidemia: Secondary | ICD-10-CM | POA: Diagnosis not present

## 2023-07-09 DIAGNOSIS — R2689 Other abnormalities of gait and mobility: Secondary | ICD-10-CM

## 2023-07-09 DIAGNOSIS — G301 Alzheimer's disease with late onset: Secondary | ICD-10-CM

## 2023-07-09 LAB — CBC WITH DIFFERENTIAL/PLATELET
Basophils Absolute: 0 10*3/uL (ref 0.0–0.2)
Basos: 0 %
EOS (ABSOLUTE): 0.6 10*3/uL — ABNORMAL HIGH (ref 0.0–0.4)
Eos: 8 %
Hematocrit: 42.3 % (ref 37.5–51.0)
Hemoglobin: 14.1 g/dL (ref 13.0–17.7)
Immature Grans (Abs): 0 10*3/uL (ref 0.0–0.1)
Immature Granulocytes: 1 %
Lymphocytes Absolute: 1.7 10*3/uL (ref 0.7–3.1)
Lymphs: 23 %
MCH: 29.9 pg (ref 26.6–33.0)
MCHC: 33.3 g/dL (ref 31.5–35.7)
MCV: 90 fL (ref 79–97)
Monocytes Absolute: 0.7 10*3/uL (ref 0.1–0.9)
Monocytes: 9 %
Neutrophils Absolute: 4.4 10*3/uL (ref 1.4–7.0)
Neutrophils: 59 %
Platelets: 279 10*3/uL (ref 150–450)
RBC: 4.71 x10E6/uL (ref 4.14–5.80)
RDW: 12.5 % (ref 11.6–15.4)
WBC: 7.4 10*3/uL (ref 3.4–10.8)

## 2023-07-09 LAB — COMPREHENSIVE METABOLIC PANEL
ALT: 14 IU/L (ref 0–44)
AST: 19 IU/L (ref 0–40)
Albumin: 4.4 g/dL (ref 3.7–4.7)
Alkaline Phosphatase: 105 IU/L (ref 44–121)
BUN/Creatinine Ratio: 15 (ref 10–24)
BUN: 18 mg/dL (ref 8–27)
Bilirubin Total: 0.4 mg/dL (ref 0.0–1.2)
CO2: 21 mmol/L (ref 20–29)
Calcium: 9.6 mg/dL (ref 8.6–10.2)
Chloride: 106 mmol/L (ref 96–106)
Creatinine, Ser: 1.17 mg/dL (ref 0.76–1.27)
Globulin, Total: 2.3 g/dL (ref 1.5–4.5)
Glucose: 90 mg/dL (ref 70–99)
Potassium: 4.5 mmol/L (ref 3.5–5.2)
Sodium: 140 mmol/L (ref 134–144)
Total Protein: 6.7 g/dL (ref 6.0–8.5)
eGFR: 62 mL/min/{1.73_m2} (ref >59)

## 2023-07-09 LAB — LIPID PANEL
Chol/HDL Ratio: 2.7 ratio (ref 0.0–5.0)
Cholesterol, Total: 134 mg/dL (ref 100–199)
HDL: 49 mg/dL (ref >39)
LDL Chol Calc (NIH): 71 mg/dL (ref 0–99)
Triglycerides: 69 mg/dL (ref 0–149)
VLDL Cholesterol Cal: 14 mg/dL (ref 5–40)

## 2023-07-09 NOTE — Progress Notes (Signed)
Subjective:  Patient ID: Marcus King, male    DOB: 1941-09-15  Age: 82 y.o. MRN: [id number]  Chief Complaint  Patient presents with   Medical Management of Chronic Issues    HPI Hypertension: Taking Amlodipine 5 mg twice a day (May be taking only once due to forgetting), Carvedilol 25 mg twice a day, lisinopril 20 mg twice daily. Bp has been running fairly normal. His range in the morning is 155-160/55.    Hyperlipidemia: He takes Pravastatin 20 mg daily.   GAD:  Buspar 30 mg twice a day, Paroxetine 40 mg daily.  CORONARY ARTERY DISEASE: Dxd 07/27/2022. Coronary Angiography Findings:  --LM: Minimal luminal irregularities  --LAD: Mid long 20%  --LCx: Minimal luminal irregularities  --RI: Minimal luminal irregularities  --RCA: Mid 95%, calcified  --AV crossed: LVEDP: 8 mmHg  LHC done Mid RCA stented. Aspirin , coreg, and pravastatin.   Atopic Dermatitis: Taking Dupixent inject q4 weeks, Hydroxyzine 25 mg  one before bed.  Skin is doing well. Itching is well managed.   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 Lupron Depot 3 month IM, NubeQa 300 mg 3 times per week. Sees oncology.   Pulmonary embolus BL and leg dvt occurred in 04/2020. He was s/p covid 19, but his platelets were normal at the time. Metastatic prostate cancer was also discovered at that time. He is on aspirin 81 mg daily.  Alzheimer's dementia: Patient was referred to Dr. Alton Revere, neurology, who had an mri done. This showed some ischemic changes and moderate generalized atrophy. Patient was initially started on aricept, but it caused gi upset. He is tolerating namenda 5 mg daily. They were unhappy with the care given by Dr. Alton Revere.     07/09/2023   10:19 AM 02/09/2023   10:20 AM 07/21/2022    3:04 PM 02/04/2022   10:38 AM 09/24/2021    9:55 AM  Depression screen PHQ 2/9  Decreased Interest 0 0 0 0 0  Down, Depressed, Hopeless 0 0 0 0 0  PHQ - 2 Score 0 0 0 0 0  Altered sleeping 0      Tired, decreased energy  3      Change in appetite 0      Feeling bad or failure about yourself  0      Trouble concentrating 0      Moving slowly or fidgety/restless 3      Suicidal thoughts 0      PHQ-9 Score 6      Difficult doing work/chores Not difficult at all          07/09/2023   10:20 AM  GAD 7 : Generalized Anxiety Score  Nervous, Anxious, on Edge 3  Control/stop worrying 3  Worry too much - different things 3  Trouble relaxing 0  Restless 0  Easily annoyed or irritable 0  Afraid - awful might happen 0  Total GAD 7 Score 9  Anxiety Difficulty Somewhat difficult       Patient Care Team: Blane Ohara, MD as PCP - General (Family Medicine) Weston Settle, MD as PCP - Hematology/Oncology (Oncology) Olegario Shearer, MD as Referring Physician (Dermatology) Elvis Coil, MD as Consulting Physician (Nephrology) Diamond Nickel., MD as Referring Physician (Cardiology) Zettie Pho, [facility] (Inactive) (Pharmacist)   Review of Systems  Constitutional:  Positive for fatigue. Negative for chills and fever.  HENT:  Negative for congestion, rhinorrhea and sore throat.   Respiratory:  Negative for cough and shortness of breath.  Cardiovascular:  Negative for chest pain and palpitations.  Gastrointestinal:  Negative for abdominal pain, constipation, diarrhea, nausea and vomiting.  Genitourinary:  Negative for dysuria and urgency.  Musculoskeletal:  Negative for arthralgias, back pain and myalgias.  Neurological:  Positive for dizziness. Negative for headaches.       Balance issues  Psychiatric/Behavioral:  Negative for dysphoric mood. The patient is not nervous/anxious.     Current Outpatient Medications on File Prior to Visit  Medication Sig Dispense Refill   amLODipine (NORVASC) 10 MG tablet Take 10 mg by mouth daily.     aspirin EC 81 MG tablet Take 81 mg by mouth daily. Swallow whole.     busPIRone (BUSPAR) 30 MG tablet Take 1 tablet by mouth in the morning and at bedtime. 60 tablet 0    carvedilol (COREG) 25 MG tablet take 1 tablet (25 mg) by oral route 2 times per day with food 180 tablet 0   clobetasol cream (TEMOVATE) 0.05 % Apply topically 2 (two) times daily as needed. (Patient not taking: Reported on 03/22/2023)     clopidogrel (PLAVIX) 75 MG tablet Take 1 tablet by mouth daily.     darolutamide (NUBEQA) 300 MG tablet Take 1 tablet (300 mg total) by mouth daily. Take on MONDAY, WEDNESDAY, AND FRIDAY ONLY 48 tablet 3   Dupilumab 100 MG/0.67ML SOSY Inject into the skin.     eplerenone (INSPRA) 25 MG tablet Take 1 tablet (25 mg total) by mouth daily. 90 tablet 1   hydrOXYzine (ATARAX) 25 MG tablet Take 1 tablet (25 mg total) by mouth every 8 (eight) hours as needed. 270 tablet 1   Leuprolide Acetate, 3 Month, (LUPRON DEPOT, 75-MONTH, IM) Inject into the muscle.     lisinopril (ZESTRIL) 20 MG tablet Take 1 tablet (20 mg total) by mouth 2 (two) times daily. 180 tablet 0   loratadine (CLARITIN) 10 MG tablet Take by mouth.     memantine (NAMENDA) 5 MG tablet TAKE ONE TABLET BY MOUTH EVERY DAY FOR 3 WEEKS, then increase to 1 TABLET TWICE DAILY 60 tablet 3   mupirocin ointment (BACTROBAN) 2 % Apply topically 3 (three) times daily. (Patient not taking: Reported on 03/22/2023)     pravastatin (PRAVACHOL) 20 MG tablet Take 1 tablet (20 mg total) by mouth daily. 90 tablet 1   Vitamin D, Ergocalciferol, (DRISDOL) 1.25 MG (50000 UNIT) CAPS capsule Take 1 capsule (50,000 Units total) by mouth 2 (two) times a week. 24 capsule 1   No current facility-administered medications on file prior to visit.   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, NYHA class 1 (HCC) 09/21/2018   Dyslipidemia 08/11/2017   Dyspnea on exertion 04/23/2020   Edema extremities 04/14/2018   Elevated glucose 10/17/2019    Fatigue 04/23/2020   Food poisoning    GAD (generalized anxiety disorder)    H/O deep venous thrombosis 04/14/2021   Headache    sinus   History of pulmonary embolism 06/13/2020   History of pulmonary embolus (PE) 06/13/2020   Hypertension    Hypertensive heart disease 08/05/2017   Hypoxia 10/17/2019   Inguinal lymphadenopathy 05/26/2020   LVH (left ventricular hypertrophy) due to hypertensive disease 08/11/2017   Malignant neoplasm of prostate (HCC) 09/09/2020   Mixed hyperlipidemia    Orthostatic  hypotension    Orthostatic syncope 10/17/2019   OSA on CPAP    Prostate cancer (HCC)    Pulmonary embolism (HCC) 04/2020   PVC's (premature ventricular contractions) 05/18/2018   Rectal mass 05/24/2020   S/P TKR (total knee replacement) using cement, 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   Solid nodule of lung greater than 8 mm in diameter 05/26/2020   Past Surgical History:  Procedure Laterality Date   CARDIAC CATHETERIZATION  07/17/2022   CATARACT EXTRACTION W/ INTRAOCULAR LENS  IMPLANT, BILATERAL Bilateral    COLON SURGERY  2014   COLOSTOMY REVERSAL  2014   HERNIA REPAIR     x2   JOINT REPLACEMENT     PROSTATE SURGERY  2006   SEPTOPLASTY     TOTAL KNEE ARTHROPLASTY Left 09/28/2016   Procedure: TOTAL KNEE ARTHROPLASTY;  Surgeon: Dannielle Huh, MD;  Location: [facility] OR;  Service: Orthopedics;  Laterality: Left;    Family History  Problem Relation Age of Onset   Stroke Mother    Parkinson's disease Mother    Lung disease Father    Stroke Father    Alcoholism Father    Leukemia Sister    Muscular dystrophy Son        Becker's   Alzheimer's disease Neg Hx    Social History   Socioeconomic History   Marital status: Married    Spouse name: Eber Jones   Number of children: 2   Years of education: Not on file   Highest education level: High school graduate  Occupational History   Occupation: Merchant navy officer    Comment: ICDs/Wound Care  Tobacco Use   Smoking status: Former    Current packs/day: 0.00     Types: Cigarettes    Start date: 1959    Quit date: 1979    Years since quitting: 45.6   Smokeless tobacco: Never  Vaping Use   Vaping status: Never Used  Substance and Sexual Activity   Alcohol use: No   Drug use: Never   Sexual activity: Not on file  Other Topics Concern   Not on file  Social History Narrative   LIves at home with his wife   Daughter lives in [place], Son lives in [place]   Right handed   Drinks about 2 cups of caffeine daily   Social Determinants of Health   Financial Resource Strain: Low Risk  (03/18/2023)   Overall Financial Resource Strain (CARDIA)    Difficulty of Paying Living Expenses: Not hard at all  Food Insecurity: No Food Insecurity (03/18/2023)   Hunger Vital Sign    Worried About Running Out of Food in the Last Year: Never true    Ran Out of Food in the Last Year: Never true  Transportation Needs: Unknown (03/18/2023)   PRAPARE - Administrator, Civil Service (Medical): Patient declined    Lack of Transportation (Non-Medical): No  Physical Activity: Inactive (03/18/2023)   Exercise Vital Sign    Days of Exercise per Week: 0 days    Minutes of Exercise per Session: 0 min  Stress: No Stress Concern Present (03/18/2023)   [employer] of Occupational Health - Occupational Stress Questionnaire    Feeling of Stress : Not at all  Social Connections: Socially Integrated (03/18/2023)   Social Connection and Isolation Panel [NHANES]    Frequency of Communication with Friends and Family: More than three times a week    Frequency of Social Gatherings with Friends and Family: Once a week    Attends Religious  Services: More than 4 times per year    Active Member of Clubs or Organizations: No    Attends Engineer, structural: More than 4 times per year    Marital Status: Married    Objective:  BP 124/62   Pulse 60   Temp 98.2 F (36.8 C)   Resp 16   Ht 5\' 10"  (1.778 m)   Wt 192 lb (87.1 kg)   BMI 27.55 kg/m       07/12/2023    8:04 AM 06/25/2023   11:35 AM 06/25/2023   11:00 AM  BP/Weight  Systolic BP 124 147 149  Diastolic BP 62 75 71  Wt. (Lbs) 192 192.04 193.5  BMI 27.55 kg/m2 27.55 kg/m2 27.76 kg/m2    Physical Exam Vitals reviewed.  Constitutional:      Appearance: Normal appearance.  Neck:     Vascular: No carotid bruit.  Cardiovascular:     Rate and Rhythm: Normal rate and regular rhythm.     Heart sounds: Normal heart sounds.  Pulmonary:     Effort: Pulmonary effort is normal.     Breath sounds: Normal breath sounds. No wheezing, rhonchi or rales.  Abdominal:     General: Bowel sounds are normal.     Palpations: Abdomen is soft.     Tenderness: There is no abdominal tenderness.  Neurological:     Mental Status: He is alert.  Psychiatric:        Mood and Affect: Mood normal.        Behavior: Behavior normal.     Diabetic Foot Exam - Simple   No data filed      Lab Results  Component Value Date   WBC 7.4 07/09/2023   HGB 14.1 07/09/2023   HCT 42.3 07/09/2023   PLT 279 07/09/2023   GLUCOSE 90 07/09/2023   CHOL 134 07/09/2023   TRIG 69 07/09/2023   HDL 49 07/09/2023   LDLCALC 71 07/09/2023   ALT 14 07/09/2023   AST 19 07/09/2023   NA 140 07/09/2023   K 4.5 07/09/2023   CL 106 07/09/2023   CREATININE 1.17 07/09/2023   BUN 18 07/09/2023   CO2 21 07/09/2023   TSH 1.260 01/23/2022   INR 1.03 01/09/2019   HGBA1C 5.6 12/01/2017      Assessment & Plan:    Mixed hyperlipidemia Assessment & Plan: Continue Pravastatin 20 mg daily.   Recommend continue to work on eating healthy diet and exercise. Check lipid panel.   Orders: -     Comprehensive metabolic panel -     Lipid panel  Hypertensive heart disease with congestive heart failure, unspecified heart failure type [facility]) Assessment & Plan: Continue Amlodipine 5 mg twice daily. Lisinopril 20 mg twice daily, lasix 20 mg daily, eplerenone 25 mg once daily, and Carvedilol 25 mg twice daily.  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     OSA treated with BiPAP Assessment & Plan: Continue CPAP.  Compliant and benefiting.   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 Patient may call us to increase dose as per patient they had difficulty reaching and getting a response from neurology.     Imbalance Assessment & Plan: Caution with ambulation.      No orders of the defined types were placed in this encounter.   Orders Placed This Encounter  Procedures   Comprehensive metabolic panel   CBC with Differential/Platelet   Lipid panel     Follow-up: Return in about 3 months (around 10/09/2023) for chronic fasting.   I,Carolyn M Morrison,acting as a Neurosurgeon for Blane Ohara, MD.,have documented all relevant documentation on the behalf of Blane Ohara, MD,as directed by  Blane Ohara, MD while in the presence of Blane Ohara, MD.    Clayborn Bigness I Leal-Borjas,acting as a scribe for Blane Ohara, MD.,have documented all relevant documentation on the behalf of Blane Ohara, MD,as directed by  Blane Ohara, MD while in the presence of Blane Ohara, MD.   An After Visit Summary was printed and given to the patient.  Blane Ohara, MD [facility] [phone]

## 2023-07-10 NOTE — Assessment & Plan Note (Signed)
Continue Amlodipine 5 mg twice daily. Lisinopril 20 mg twice daily, lasix 20 mg daily, eplerenone 25 mg once daily, and Carvedilol 25 mg twice daily.

## 2023-07-10 NOTE — Assessment & Plan Note (Signed)
The current medical regimen is effective;  continue present plan and medications.

## 2023-07-10 NOTE — Assessment & Plan Note (Signed)
Management per specialist. 

## 2023-07-10 NOTE — Assessment & Plan Note (Signed)
Continue Pravastatin 20 mg daily.   Recommend continue to work on eating healthy diet and exercise. Check lipid panel.

## 2023-07-10 NOTE — Assessment & Plan Note (Signed)
Memantine 5 mg daily. Patient may call us to increase dose as per patient they had difficulty reaching and getting a response from neurology.

## 2023-07-12 ENCOUNTER — Other Ambulatory Visit: Payer: Self-pay | Admitting: Family Medicine

## 2023-07-12 ENCOUNTER — Encounter: Payer: Self-pay | Admitting: Family Medicine

## 2023-07-12 NOTE — Assessment & Plan Note (Signed)
Caution with ambulation.

## 2023-07-15 DIAGNOSIS — L209 Atopic dermatitis, unspecified: Secondary | ICD-10-CM | POA: Diagnosis not present

## 2023-08-03 ENCOUNTER — Other Ambulatory Visit: Payer: Self-pay | Admitting: Family Medicine

## 2023-08-03 DIAGNOSIS — F411 Generalized anxiety disorder: Secondary | ICD-10-CM

## 2023-08-04 ENCOUNTER — Other Ambulatory Visit: Payer: Self-pay

## 2023-08-04 DIAGNOSIS — R0609 Other forms of dyspnea: Secondary | ICD-10-CM | POA: Diagnosis not present

## 2023-08-04 DIAGNOSIS — I10 Essential (primary) hypertension: Secondary | ICD-10-CM | POA: Diagnosis not present

## 2023-08-04 DIAGNOSIS — Z7901 Long term (current) use of anticoagulants: Secondary | ICD-10-CM | POA: Diagnosis not present

## 2023-08-04 DIAGNOSIS — Z955 Presence of coronary angioplasty implant and graft: Secondary | ICD-10-CM | POA: Diagnosis not present

## 2023-08-04 DIAGNOSIS — I2510 Atherosclerotic heart disease of native coronary artery without angina pectoris: Secondary | ICD-10-CM | POA: Diagnosis not present

## 2023-08-04 DIAGNOSIS — Z86718 Personal history of other venous thrombosis and embolism: Secondary | ICD-10-CM | POA: Diagnosis not present

## 2023-08-04 DIAGNOSIS — I679 Cerebrovascular disease, unspecified: Secondary | ICD-10-CM | POA: Diagnosis not present

## 2023-08-05 MED ORDER — AMLODIPINE BESYLATE 10 MG PO TABS: 10.0000 mg | ORAL_TABLET | Freq: Every day | ORAL | 1 refills | Status: DC

## 2023-08-05 MED ORDER — PRAVASTATIN SODIUM 20 MG PO TABS: 20.0000 mg | ORAL_TABLET | Freq: Every day | ORAL | 1 refills | Status: DC

## 2023-08-25 DIAGNOSIS — L570 Actinic keratosis: Secondary | ICD-10-CM | POA: Diagnosis not present

## 2023-08-25 DIAGNOSIS — L299 Pruritus, unspecified: Secondary | ICD-10-CM | POA: Diagnosis not present

## 2023-08-25 DIAGNOSIS — L578 Other skin changes due to chronic exposure to nonionizing radiation: Secondary | ICD-10-CM | POA: Diagnosis not present

## 2023-08-25 DIAGNOSIS — L209 Atopic dermatitis, unspecified: Secondary | ICD-10-CM | POA: Diagnosis not present

## 2023-08-25 DIAGNOSIS — C44219 Basal cell carcinoma of skin of left ear and external auricular canal: Secondary | ICD-10-CM | POA: Diagnosis not present

## 2023-09-06 DIAGNOSIS — F0280 Dementia in other diseases classified elsewhere without behavioral disturbance: Secondary | ICD-10-CM | POA: Diagnosis not present

## 2023-09-06 DIAGNOSIS — R413 Other amnesia: Secondary | ICD-10-CM | POA: Diagnosis not present

## 2023-09-06 DIAGNOSIS — G309 Alzheimer's disease, unspecified: Secondary | ICD-10-CM | POA: Diagnosis not present

## 2023-09-07 ENCOUNTER — Other Ambulatory Visit: Payer: Self-pay | Admitting: Family Medicine

## 2023-09-07 DIAGNOSIS — F411 Generalized anxiety disorder: Secondary | ICD-10-CM

## 2023-09-07 NOTE — Telephone Encounter (Signed)
I never send in his refills due to his constant self changing of medications :)

## 2023-09-08 DIAGNOSIS — I351 Nonrheumatic aortic (valve) insufficiency: Secondary | ICD-10-CM | POA: Diagnosis not present

## 2023-09-11 ENCOUNTER — Other Ambulatory Visit: Payer: Self-pay | Admitting: Family Medicine

## 2023-09-11 ENCOUNTER — Other Ambulatory Visit: Payer: Self-pay | Admitting: Physician Assistant

## 2023-09-11 DIAGNOSIS — I110 Hypertensive heart disease with heart failure: Secondary | ICD-10-CM

## 2023-09-20 ENCOUNTER — Other Ambulatory Visit: Payer: Self-pay | Admitting: Family Medicine

## 2023-09-22 ENCOUNTER — Inpatient Hospital Stay: Payer: Medicare Other | Attending: Oncology

## 2023-09-22 DIAGNOSIS — C61 Malignant neoplasm of prostate: Secondary | ICD-10-CM | POA: Insufficient documentation

## 2023-09-22 DIAGNOSIS — Z5111 Encounter for antineoplastic chemotherapy: Secondary | ICD-10-CM | POA: Diagnosis not present

## 2023-09-22 DIAGNOSIS — C774 Secondary and unspecified malignant neoplasm of inguinal and lower limb lymph nodes: Secondary | ICD-10-CM | POA: Insufficient documentation

## 2023-09-22 LAB — PSA: Prostatic Specific Antigen: 0.06 ng/mL (ref 0.00–4.00)

## 2023-09-23 ENCOUNTER — Other Ambulatory Visit: Payer: Medicare Other

## 2023-09-23 NOTE — Progress Notes (Signed)
[facility]  [address] [place],  [place]  [address] [phone]  Clinic Day:  09/24/2023  Referring physician: Blane Ohara, MD   HISTORY OF PRESENT ILLNESS:  The patient is an 82 y.o. 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  He has been on complete androgen deprivation therapy for months, which consists of Lupron/darolutamide.  As it pertains to his prostate cancer management, other than fatigue, he denies having any side effects from his complete androgen blockade therapy.  He takes darolutamide just 3 times per week.  He denies having any new symptoms or findings that concern him for progression of his metastatic prostate cancer.    PHYSICAL EXAM:  Blood pressure 136/64, pulse 62, temperature (!) 97.5 F (36.4 C), resp. rate 16, height 5\' 10"  (1.778 m), weight 196 lb 8 oz (89.1 kg), SpO2 95%. Wt Readings from Last 3 Encounters:  09/24/23 196 lb 8 oz (89.1 kg)  07/12/23 192 lb (87.1 kg)  06/25/23 192 lb 0.6 oz (87.1 kg)   Body mass index is 28.19 kg/m. Performance status (ECOG): 1 Physical Exam Constitutional:      General: He is not in acute distress.    Appearance: Normal appearance. He is normal weight.  HENT:     Head: Normocephalic and atraumatic.  Eyes:     General: No scleral icterus.    Extraocular Movements: Extraocular movements intact.     Conjunctiva/sclera: Conjunctivae normal.     Pupils: Pupils are equal, round, and reactive to light.  Cardiovascular:     Rate and Rhythm: Normal rate and regular rhythm.     Pulses: Normal pulses.     Heart sounds: Normal heart sounds. No murmur heard.    No friction rub. No gallop.  Pulmonary:     Effort: Pulmonary effort is normal. No respiratory distress.     Breath sounds: Normal breath sounds.  Abdominal:     General: Bowel sounds are normal. There is no distension.     Palpations: Abdomen is soft.  There is no hepatomegaly, splenomegaly or mass.     Tenderness: There is no abdominal tenderness.  Musculoskeletal:        General: Normal range of motion.     Cervical back: Normal range of motion and neck supple.     Right lower leg: No edema.     Left lower leg: No edema.  Lymphadenopathy:     Cervical: No cervical adenopathy.  Skin:    General: Skin is warm and dry.  Neurological:     General: No focal deficit present.     Mental Status: He is alert and oriented to person, place, and time. Mental status is at baseline.  Psychiatric:        Mood and Affect: Mood normal.        Behavior: Behavior normal.        Thought Content: Thought content normal.        Judgment: Judgment normal.    LABS:    Latest Reference Range & Units 03/25/23 11:06 06/24/23 10:59 09/22/23 11:16  Prostatic Specific Antigen 0.00 - 4.00 ng/mL 0.06 0.05 0.06   ASSESSMENT & PLAN:  Assessment/Plan:  An 82 y.o. male with metastatic prostate cancer.  His PSA level of .06 is still consistent with undetectable disease, which is when the level is < 0.1.  Based upon this, he will continue to take darolutamide 300  mg 3 times per week.  He will continue Lupron shots every 3 months, including his next shot today.  I will see him back in 3 months for repeat clinical assessment. The patient understands all the plans discussed today and is in agreement with them.    Sundus Pete Kirby Funk, MD

## 2023-09-24 ENCOUNTER — Other Ambulatory Visit: Payer: Self-pay | Admitting: Oncology

## 2023-09-24 ENCOUNTER — Inpatient Hospital Stay (INDEPENDENT_AMBULATORY_CARE_PROVIDER_SITE_OTHER): Payer: Medicare Other | Admitting: Oncology

## 2023-09-24 ENCOUNTER — Ambulatory Visit: Payer: Medicare Other

## 2023-09-24 VITALS — BP 136/64 | HR 62 | Temp 97.5°F | Resp 16 | Ht 70.0 in | Wt 196.5 lb

## 2023-09-24 DIAGNOSIS — C7989 Secondary malignant neoplasm of other specified sites: Secondary | ICD-10-CM

## 2023-09-24 DIAGNOSIS — C61 Malignant neoplasm of prostate: Secondary | ICD-10-CM | POA: Diagnosis not present

## 2023-09-25 ENCOUNTER — Encounter: Payer: Self-pay | Admitting: Oncology

## 2023-09-28 ENCOUNTER — Telehealth: Payer: Self-pay | Admitting: Oncology

## 2023-09-28 NOTE — Telephone Encounter (Signed)
Patient has been scheduled. Aware of appt date and time.   Scheduling Message Entered by Rennis Harding A on 09/24/2023 at  6:23 PM Priority: Routine <No visit type provided>  Department: [facility]  Provider:  Scheduling Notes:  Labs 12-23-23  Appt/lupron injection 12-24-23

## 2023-09-29 ENCOUNTER — Other Ambulatory Visit: Payer: Self-pay | Admitting: Oncology

## 2023-09-29 ENCOUNTER — Inpatient Hospital Stay: Payer: Medicare Other

## 2023-09-29 ENCOUNTER — Other Ambulatory Visit: Payer: Self-pay

## 2023-09-29 VITALS — BP 131/70 | HR 67 | Temp 97.4°F | Wt 195.1 lb

## 2023-09-29 DIAGNOSIS — Z5111 Encounter for antineoplastic chemotherapy: Secondary | ICD-10-CM | POA: Diagnosis not present

## 2023-09-29 DIAGNOSIS — C61 Malignant neoplasm of prostate: Secondary | ICD-10-CM | POA: Diagnosis not present

## 2023-09-29 DIAGNOSIS — C774 Secondary and unspecified malignant neoplasm of inguinal and lower limb lymph nodes: Secondary | ICD-10-CM | POA: Diagnosis not present

## 2023-09-29 MED ORDER — LEUPROLIDE ACETATE (3 MONTH) 22.5 MG IM KIT
22.5000 mg | PACK | Freq: Once | INTRAMUSCULAR | Status: AC
  Administered 2023-09-29: 22.5 mg via INTRAMUSCULAR
  Filled 2023-09-29: qty 22.5

## 2023-09-29 MED ORDER — DAROLUTAMIDE 300 MG PO TABS: 300.0000 mg | ORAL_TABLET | Freq: Every day | ORAL | 3 refills | Status: DC

## 2023-09-29 NOTE — Telephone Encounter (Signed)
Attempted call to pt to notify him that he will need to call [facility] and reorder his Nubeqa @ [phone]. He will need to do this at least 1 week before he is out of medication in the future.

## 2023-09-29 NOTE — Patient Instructions (Signed)
Leuprolide Solution for Injection What is this medication? LEUPROLIDE (loo PROE lide) reduces the symptoms of prostate cancer. It works by decreasing levels of the hormone testosterone in the body. This prevents prostate cancer cells from spreading or growing. This medicine may be used for other purposes; ask your health care provider or pharmacist if you have questions. COMMON BRAND NAME(S): Lupron What should I tell my care team before I take this medication? They need to know if you have any of these conditions: Diabetes Heart attack Heart disease High blood pressure High cholesterol Pain or difficulty passing urine Spinal cord metastasis Stroke Tobacco use An unusual or allergic reaction to leuprolide, other medications, foods, dyes, or preservatives Pregnant or trying to get pregnant Breast-feeding How should I use this medication? This medication is for injection under the skin or into a muscle. You will be taught how to prepare and give this medication. Use exactly as directed. Take your medication at regular intervals. Do not take it more often than directed. It is important that you put your used needles and syringes in a special sharps container. Do not put them in a trash can. If you do not have a sharps container, call your care team to get one. A special MedGuide will be given to you by the pharmacist with each prescription and refill. Be sure to read this information carefully each time. Talk to your care team about the use of this medication in children. While this medication may be prescribed for children as young as 8 years for selected conditions, precautions do apply. Overdosage: If you think you have taken too much of this medicine contact a poison control center or emergency room at once. NOTE: This medicine is only for you. Do not share this medicine with others. What if I miss a dose? If you miss a dose, take it as soon as you can. If it is almost time for your next  dose, take only that dose. Do not take double or extra doses. What may interact with this medication? Do not take this medication with any of the following: Chasteberry Cisapride Dronedarone Pimozide Thioridazine This medication may also interact with the following: Estrogen or progestin hormones Herbal or dietary supplements, like black cohosh or DHEA Other medications that cause heart rhythm changes Testosterone This list may not describe all possible interactions. Give your health care provider a list of all the medicines, herbs, non-prescription drugs, or dietary supplements you use. Also tell them if you smoke, drink alcohol, or use illegal drugs. Some items may interact with your medicine. What should I watch for while using this medication? Visit your care team for regular checks on your progress. During the first week, your symptoms may get worse, but then will improve as you continue your treatment. You may get hot flashes, increased bone pain, increased difficulty passing urine, or an aggravation of nerve symptoms. Discuss these effects with your care team, some of them may improve with continued use of this medication. Patients may experience a menstrual cycle or spotting during the first 2 months of therapy with this medication. If this continues, contact your care team. This medication may increase blood sugar. The risk may be higher in patients who already have diabetes. Ask your care team what you can do to lower your risk of diabetes while taking this medication. What side effects may I notice from receiving this medication? Side effects that you should report to your care team as soon as possible: Allergic reactions--skin rash,  itching, hives, swelling of the face, lips, tongue, or throat Heart attack--pain or tightness in the chest, shoulders, arms, or jaw, nausea, shortness of breath, cold or clammy skin, feeling faint or lightheaded Heart rhythm changes--fast or irregular  heartbeat, dizziness, feeling faint or lightheaded, chest pain, trouble breathing High blood sugar (hyperglycemia)--increased thirst or amount of urine, unusual weakness or fatigue, blurry vision New or worsening seizures Redness, blistering, peeling, or loosening of the skin, including inside the mouth Stroke--sudden numbness or weakness of the face, arm, or leg, trouble speaking, confusion, trouble walking, loss of balance or coordination, dizziness, severe headache, change in vision Swelling and pain of the tumor site or lymph nodes Side effects that usually do not require medical attention (report these to your care team if they continue or are bothersome): Change in sex drive or performance Hot flashes Joint pain Pain, redness, or irritation at injection site Swelling of the ankles, hands, or feet Unusual weakness or fatigue This list may not describe all possible side effects. Call your doctor for medical advice about side effects. You may report side effects to FDA at 1-800-FDA-1088. Where should I keep my medication? Keep out of the reach of children and pets. Store below 25 degrees C (77 degrees F). Do not freeze. Protect from light. Get rid of any unused medication after the expiration date. To get rid of medications that are no longer needed or have expired: Take the medication to a medication take-back program. Check with your pharmacy or law enforcement to find a location. If you cannot return the medication, ask your pharmacist or care team how to get rid of this medication safely. NOTE: This sheet is a summary. It may not cover all possible information. If you have questions about this medicine, talk to your doctor, pharmacist, or health care provider.  2024 Elsevier/Gold Standard (2023-04-20 00:00:00)

## 2023-09-30 ENCOUNTER — Encounter: Payer: Self-pay | Admitting: Oncology

## 2023-10-05 ENCOUNTER — Other Ambulatory Visit: Payer: Self-pay | Admitting: Family Medicine

## 2023-10-06 DIAGNOSIS — H0100A Unspecified blepharitis right eye, upper and lower eyelids: Secondary | ICD-10-CM | POA: Diagnosis not present

## 2023-10-06 DIAGNOSIS — H26493 Other secondary cataract, bilateral: Secondary | ICD-10-CM | POA: Insufficient documentation

## 2023-10-06 DIAGNOSIS — H52203 Unspecified astigmatism, bilateral: Secondary | ICD-10-CM | POA: Diagnosis not present

## 2023-10-06 DIAGNOSIS — H5212 Myopia, left eye: Secondary | ICD-10-CM | POA: Diagnosis not present

## 2023-10-06 DIAGNOSIS — H04123 Dry eye syndrome of bilateral lacrimal glands: Secondary | ICD-10-CM | POA: Insufficient documentation

## 2023-10-06 DIAGNOSIS — H16211 Exposure keratoconjunctivitis, right eye: Secondary | ICD-10-CM | POA: Insufficient documentation

## 2023-10-06 DIAGNOSIS — H0100B Unspecified blepharitis left eye, upper and lower eyelids: Secondary | ICD-10-CM | POA: Diagnosis not present

## 2023-10-06 DIAGNOSIS — Z961 Presence of intraocular lens: Secondary | ICD-10-CM | POA: Diagnosis not present

## 2023-10-06 DIAGNOSIS — H43393 Other vitreous opacities, bilateral: Secondary | ICD-10-CM | POA: Insufficient documentation

## 2023-10-06 DIAGNOSIS — H5201 Hypermetropia, right eye: Secondary | ICD-10-CM | POA: Diagnosis not present

## 2023-10-06 DIAGNOSIS — H524 Presbyopia: Secondary | ICD-10-CM | POA: Diagnosis not present

## 2023-10-08 ENCOUNTER — Other Ambulatory Visit: Payer: Self-pay

## 2023-10-08 DIAGNOSIS — F411 Generalized anxiety disorder: Secondary | ICD-10-CM

## 2023-10-13 NOTE — Assessment & Plan Note (Addendum)
INCREASE NAMENDA 10 MG TWICE A DAY.

## 2023-10-13 NOTE — Assessment & Plan Note (Signed)
Continue Pravastatin 20 mg daily.   Recommend continue to work on eating healthy diet and exercise. Check lipid panel.

## 2023-10-13 NOTE — Assessment & Plan Note (Deleted)
Continue Amlodipine 5 mg twice daily. Lisinopril 20 mg twice daily, lasix 20 mg daily, eplerenone 25 mg once daily, and Carvedilol 25 mg twice daily.

## 2023-10-14 ENCOUNTER — Encounter: Payer: Self-pay | Admitting: Family Medicine

## 2023-10-14 ENCOUNTER — Ambulatory Visit: Payer: Medicare Other | Admitting: Family Medicine

## 2023-10-14 VITALS — BP 96/42 | HR 61 | Temp 97.2°F | Ht 70.0 in | Wt 194.0 lb

## 2023-10-14 DIAGNOSIS — F02B3 Dementia in other diseases classified elsewhere, moderate, with mood disturbance: Secondary | ICD-10-CM | POA: Diagnosis not present

## 2023-10-14 DIAGNOSIS — E782 Mixed hyperlipidemia: Secondary | ICD-10-CM

## 2023-10-14 DIAGNOSIS — G301 Alzheimer's disease with late onset: Secondary | ICD-10-CM

## 2023-10-14 DIAGNOSIS — I5089 Other heart failure: Secondary | ICD-10-CM

## 2023-10-14 DIAGNOSIS — I110 Hypertensive heart disease with heart failure: Secondary | ICD-10-CM | POA: Diagnosis not present

## 2023-10-14 DIAGNOSIS — Z23 Encounter for immunization: Secondary | ICD-10-CM

## 2023-10-14 MED ORDER — CARVEDILOL 25 MG PO TABS: ORAL_TABLET | ORAL | Status: DC

## 2023-10-14 MED ORDER — MEMANTINE HCL 10 MG PO TABS: 10.0000 mg | ORAL_TABLET | Freq: Two times a day (BID) | ORAL | 3 refills | Status: DC

## 2023-10-14 NOTE — Patient Instructions (Addendum)
FOR HYPERTENSION    ONLY TAKE AMLODIPINE 10 MG ONE AT BEDTIME (NOT TWICE A DAY) 2.   DECREASE CARVEDILOL 25 MG 1/4 (6.25) PILL TWICE A DAY. 3.   CONTINUE LISINOPRIL 20 MG TWICE A DAY.  4.   CONTINUE EPLERENONE 25 MG every day.   CHECK BP TWICE DAILY AND RECORD.  FOLLOW UP FOR NURSE VISIT IN 10 DAYS.  FOR MEMORY: INCREASE NAMENDA 10 MG TWICE A DAY.

## 2023-10-15 LAB — LIPID PANEL
Chol/HDL Ratio: 2.7 ratio (ref 0.0–5.0)
Cholesterol, Total: 133 mg/dL (ref 100–199)
HDL: 49 mg/dL (ref >39)
LDL Chol Calc (NIH): 66 mg/dL (ref 0–99)
Triglycerides: 93 mg/dL (ref 0–149)
VLDL Cholesterol Cal: 18 mg/dL (ref 5–40)

## 2023-10-15 LAB — CBC WITH DIFFERENTIAL/PLATELET
Basophils Absolute: 0 10*3/uL (ref 0.0–0.2)
Basos: 1 %
EOS (ABSOLUTE): 0.5 10*3/uL — ABNORMAL HIGH (ref 0.0–0.4)
Eos: 6 %
Hematocrit: 44.8 % (ref 37.5–51.0)
Hemoglobin: 14.8 g/dL (ref 13.0–17.7)
Immature Grans (Abs): 0 10*3/uL (ref 0.0–0.1)
Immature Granulocytes: 1 %
Lymphocytes Absolute: 2.1 10*3/uL (ref 0.7–3.1)
Lymphs: 24 %
MCH: 30.5 pg (ref 26.6–33.0)
MCHC: 33 g/dL (ref 31.5–35.7)
MCV: 92 fL (ref 79–97)
Monocytes Absolute: 0.7 10*3/uL (ref 0.1–0.9)
Monocytes: 8 %
Neutrophils Absolute: 5.4 10*3/uL (ref 1.4–7.0)
Neutrophils: 60 %
Platelets: 299 10*3/uL (ref 150–450)
RBC: 4.86 x10E6/uL (ref 4.14–5.80)
RDW: 12.8 % (ref 11.6–15.4)
WBC: 8.7 10*3/uL (ref 3.4–10.8)

## 2023-10-15 LAB — COMPREHENSIVE METABOLIC PANEL
ALT: 13 [IU]/L (ref 0–44)
AST: 21 [IU]/L (ref 0–40)
Albumin: 4.3 g/dL (ref 3.7–4.7)
Alkaline Phosphatase: 97 [IU]/L (ref 44–121)
BUN/Creatinine Ratio: 18 (ref 10–24)
BUN: 19 mg/dL (ref 8–27)
Bilirubin Total: 0.5 mg/dL (ref 0.0–1.2)
CO2: 21 mmol/L (ref 20–29)
Calcium: 9.7 mg/dL (ref 8.6–10.2)
Chloride: 103 mmol/L (ref 96–106)
Creatinine, Ser: 1.08 mg/dL (ref 0.76–1.27)
Globulin, Total: 2.4 g/dL (ref 1.5–4.5)
Glucose: 81 mg/dL (ref 70–99)
Potassium: 4.8 mmol/L (ref 3.5–5.2)
Sodium: 140 mmol/L (ref 134–144)
Total Protein: 6.7 g/dL (ref 6.0–8.5)
eGFR: 68 mL/min/{1.73_m2} (ref >59)

## 2023-10-16 NOTE — Assessment & Plan Note (Signed)
ONLY TAKE AMLODIPINE 10 MG ONE AT BEDTIME (NOT TWICE A DAY) 2.   DECREASE CARVEDILOL 25 MG 1/4 (6.25) PILL TWICE A DAY. 3.   CONTINUE LISINOPRIL 20 MG TWICE A DAY.  4.   CONTINUE EPLERENONE 25 MG every day.   CHECK BP TWICE DAILY AND RECORD.  FOLLOW UP FOR NURSE VISIT IN 10 DAYS.

## 2023-10-20 ENCOUNTER — Telehealth: Payer: Self-pay | Admitting: Pharmacy Technician

## 2023-10-20 ENCOUNTER — Encounter: Payer: Self-pay | Admitting: Pharmacy Technician

## 2023-10-20 NOTE — Telephone Encounter (Signed)
Oral Oncology Patient Advocate Encounter   Received notification that patient is due for re-enrollment for assistance for Nubeqa through [employer].   Re-enrollment process has been initiated and will be submitted upon completion of necessary documents.  [employer] phone number [phone].   I will continue to follow until final determination.  Jinger Neighbors, CPhT-Adv Oncology Pharmacy Patient Advocate [facility] Direct Number: [phone]  Fax: [phone]

## 2023-10-20 NOTE — Progress Notes (Signed)
Encounter created in error

## 2023-10-25 ENCOUNTER — Other Ambulatory Visit: Payer: Self-pay | Admitting: Oncology

## 2023-10-25 MED ORDER — DAROLUTAMIDE 300 MG PO TABS: 300.0000 mg | ORAL_TABLET | Freq: Every day | ORAL | 3 refills | Status: DC

## 2023-10-25 NOTE — Telephone Encounter (Signed)
Oral Oncology Patient Advocate Encounter   Submitted application for assistance for Nubeqa to [employer].   Application submitted via e-fax to [phone]   [employer] phone number [phone].   I will continue to check the status until final determination.   Jinger Neighbors, CPhT-Adv Oncology Pharmacy Patient Advocate [facility] Direct Number: [phone]  Fax: [phone]

## 2023-10-28 ENCOUNTER — Ambulatory Visit (INDEPENDENT_AMBULATORY_CARE_PROVIDER_SITE_OTHER): Payer: Medicare Other

## 2023-10-28 VITALS — BP 88/40 | HR 61

## 2023-10-28 DIAGNOSIS — I5089 Other heart failure: Secondary | ICD-10-CM | POA: Diagnosis not present

## 2023-10-28 DIAGNOSIS — I110 Hypertensive heart disease with heart failure: Secondary | ICD-10-CM

## 2023-10-28 NOTE — Progress Notes (Signed)
Patient is here to recheck blood pressure. His blood pressure at the office was 88/40 and pulse 61. He denied chest pain, sob, headache, dizziness.  Dr Faylene Kurtz recommended to take amlodipine 10 mg 1/2 tablet daily and lisinopril 20 mg 1/2 tablet daily, and come back in one week with blood pressure log and blood pressure cuff. Patient verbalized to understand.

## 2023-11-02 NOTE — Telephone Encounter (Signed)
Oral Oncology Patient Advocate Encounter   Received notification re-enrollment for assistance for Nubeqa through [employer] has been approved. Patient may continue to receive their medication at $0 from this program.    [employer] phone number [phone].   Effective dates: 12/15/23 through 12/13/24  I have spoken to the patient.  Jinger Neighbors, CPhT-Adv Oncology Pharmacy Patient Advocate [facility] Direct Number: [phone]  Fax: [phone]

## 2023-11-04 ENCOUNTER — Ambulatory Visit: Payer: Medicare Other

## 2023-11-08 ENCOUNTER — Other Ambulatory Visit: Payer: Self-pay | Admitting: Family Medicine

## 2023-11-08 ENCOUNTER — Other Ambulatory Visit: Payer: Self-pay

## 2023-11-08 DIAGNOSIS — F411 Generalized anxiety disorder: Secondary | ICD-10-CM

## 2023-11-22 ENCOUNTER — Ambulatory Visit (INDEPENDENT_AMBULATORY_CARE_PROVIDER_SITE_OTHER): Payer: Medicare Other | Admitting: Physician Assistant

## 2023-11-22 ENCOUNTER — Encounter: Payer: Self-pay | Admitting: Physician Assistant

## 2023-11-22 ENCOUNTER — Other Ambulatory Visit: Payer: Self-pay

## 2023-11-22 VITALS — BP 110/40 | HR 65 | Temp 98.5°F | Ht 70.0 in | Wt 192.6 lb

## 2023-11-22 DIAGNOSIS — I5089 Other heart failure: Secondary | ICD-10-CM

## 2023-11-22 DIAGNOSIS — I110 Hypertensive heart disease with heart failure: Secondary | ICD-10-CM

## 2023-11-22 DIAGNOSIS — L209 Atopic dermatitis, unspecified: Secondary | ICD-10-CM | POA: Diagnosis not present

## 2023-11-22 NOTE — Progress Notes (Signed)
Subjective:  Patient ID: Marcus King, male    DOB: June 22, 1941  Age: 82 y.o. MRN: [id number]  Chief Complaint  Patient presents with   Hypertension    HPI  Pt is in today stating he has had trouble with his blood pressure fluctuating over the past 3 weeks He states in the mornings it is 180-188/? Below 100(does not know) and then takes coreg 25mg  1/2 pill and then bp comes down to normal - eventually as the evening comes around bp raises to 150-160/80-90 and then  it is time for him to take his second dose of coreg which is 25mg  at bedtime He denies chest pain, fatigue, headaches, chest pain, dyspnea -- He states he checks his bp 4 times daily at least (states his daughter is a cardiology nurse, the fact he has pharmaceutical knowledge, etc is reason why he checks so frequently) Of note pt does have history of CAD with stent placement last year Other concern is because of unsteadiness of gait that has been going on for years and he has consulted with neurology for this    07/09/2023   10:19 AM 02/09/2023   10:20 AM 07/21/2022    3:04 PM 02/04/2022   10:38 AM 09/24/2021    9:55 AM  Depression screen PHQ 2/9  Decreased Interest 0 0 0 0 0  Down, Depressed, Hopeless 0 0 0 0 0  PHQ - 2 Score 0 0 0 0 0  Altered sleeping 0      Tired, decreased energy 3      Change in appetite 0      Feeling bad or failure about yourself  0      Trouble concentrating 0      Moving slowly or fidgety/restless 3      Suicidal thoughts 0      PHQ-9 Score 6      Difficult doing work/chores Not difficult at all            09/25/2022   10:59 AM 09/25/2022   11:58 AM 12/25/2022   12:51 PM 12/25/2022    1:22 PM 02/09/2023   10:22 AM  Fall Risk  Falls in the past year?     0  Was there an injury with Fall?     0  Fall Risk Category Calculator     0  (RETIRED) Patient Fall Risk Level Low fall risk Low fall risk Low fall risk Low fall risk   Patient at Risk for Falls Due to     No Fall Risks  Fall  risk Follow up     Falls evaluation completed;Education provided     ROS CONSTITUTIONAL: Negative for chills, fatigue, fever, unintentional weight gain and unintentional weight loss.  E/N/T: Negative for ear pain, nasal congestion and sore throat.  CARDIOVASCULAR: Negative for chest pain, dizziness, palpitations and pedal edema.  RESPIRATORY: Negative for recent cough and dyspnea.  GASTROINTESTINAL: Negative for abdominal pain, acid reflux symptoms, constipation, diarrhea, nausea and vomiting.  MSK: Negative for arthralgias and myalgias.  INTEGUMENTARY: Negative for rash.  NEUROLOGICAL: see HPI PSYCHIATRIC: Negative for sleep disturbance and to question depression screen.  Negative for depression, negative for anhedonia.    Current Outpatient Medications:    amLODipine (NORVASC) 10 MG tablet, Take 1 tablet (10 mg total) by mouth daily., Disp: 90 tablet, Rfl: 1   aspirin EC 81 MG tablet, Take 81 mg by mouth daily. Swallow whole., Disp: , Rfl:    busPIRone (BUSPAR) 30  MG tablet, TAKE ONE TABLET BY MOUTH EVERY MORNING AND EVERY DAY AT BEDTIME, Disp: 60 tablet, Rfl: 0   carvedilol (COREG) 25 MG tablet, Take 1/4 pill twice daily. (Patient taking differently: Take 25 mg by mouth. Take half a pill in the AM and 1 whole pill in the PM Daily), Disp: , Rfl:    clobetasol cream (TEMOVATE) 0.05 %, Apply topically 2 (two) times daily as needed., Disp: , Rfl:    darolutamide (NUBEQA) 300 MG tablet, Take 1 tablet (300 mg total) by mouth daily. Take on MONDAY, WEDNESDAY, AND FRIDAY ONLY, Disp: 48 tablet, Rfl: 3   eplerenone (INSPRA) 25 MG tablet, Take 1 tablet (25 mg total) by mouth daily., Disp: 90 tablet, Rfl: 1   ergocalciferol (VITAMIN D2) 1.25 MG (50000 UT) capsule, Take 50,000 Units by mouth 2 (two) times a week., Disp: , Rfl:    hydrOXYzine (ATARAX) 25 MG tablet, Take 1 tablet (25 mg total) by mouth every 8 (eight) hours as needed., Disp: 270 tablet, Rfl: 1   Leuprolide Acetate, 3 Month, (LUPRON  DEPOT, 9-MONTH, IM), Inject into the muscle., Disp: , Rfl:    lisinopril (ZESTRIL) 20 MG tablet, Take 1 tablet (20 mg total) by mouth 2 (two) times daily., Disp: 180 tablet, Rfl: 0   loratadine (CLARITIN) 10 MG tablet, Take by mouth., Disp: , Rfl:    memantine (NAMENDA) 10 MG tablet, Take 1 tablet (10 mg total) by mouth 2 (two) times daily., Disp: 60 tablet, Rfl: 3   PARoxetine (PAXIL) 40 MG tablet, TAKE ONE TABLET BY MOUTH DAILY, Disp: 90 tablet, Rfl: 0   pravastatin (PRAVACHOL) 20 MG tablet, Take 1 tablet (20 mg total) by mouth daily., Disp: 90 tablet, Rfl: 1   Dupilumab 100 MG/0.67ML SOSY, Inject into the skin., Disp: , Rfl:   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, NYHA class 1 (HCC) 09/21/2018   Dyslipidemia 08/11/2017   Dyspnea on exertion 04/23/2020   Edema extremities 04/14/2018   Elevated glucose 10/17/2019   Fatigue 04/23/2020   Food poisoning    GAD (generalized anxiety disorder)    H/O deep venous thrombosis 04/14/2021   Headache    sinus   History of pulmonary embolism 06/13/2020   History of pulmonary embolus (PE) 06/13/2020   Hypertension    Hypertensive heart disease 08/05/2017   Hypoxia 10/17/2019   Inguinal lymphadenopathy 05/26/2020   LVH (left ventricular hypertrophy) due to hypertensive disease 08/11/2017   Malignant neoplasm of prostate (HCC) 09/09/2020   Mixed hyperlipidemia    Orthostatic hypotension    Orthostatic syncope 10/17/2019   OSA on CPAP    Prostate cancer (HCC)    Pulmonary embolism (HCC) 04/2020   PVC's (premature ventricular contractions) 05/18/2018   Rectal mass 05/24/2020   S/P TKR (total knee replacement) using cement, 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   Solid nodule of lung greater than 8 mm in diameter 05/26/2020   Objective:  PHYSICAL EXAM:   BP (!) 110/40    Pulse 65   Temp 98.5 F (36.9 C)   Ht 5\' 10"  (1.778 m)   Wt 192 lb 9.6 oz (87.4 kg)   SpO2 95%   BMI 27.64 kg/m  BP Readings from Last 3 Encounters:  11/22/23 (!) 110/40  10/28/23 (!) 88/40  10/14/23 (!) 96/42  GEN: Well nourished, well developed, in no acute distress  HEENT: normal external ears and nose - normal external auditory canals and TMS - hearing grossly normal - Cardiac: RRR; no murmurs, rubs, or gallops,no edema - Respiratory:  normal respiratory rate and pattern with no distress - normal breath sounds with no rales, rhonchi, wheezes or rubs Skin: warm and dry, no rash  Neuro:  Alert and Oriented x 3,- CN II-Xii grossly intact Psych: euthymic mood, appropriate affect and demeanor  Assessment & Plan:    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  An After Visit Summary was printed and given to the patient.  Jettie Pagan [facility] [phone]

## 2023-11-22 NOTE — Telephone Encounter (Signed)
Called [facility]  to schedule appointment for patient to see heart doctor before his follow up in february due to patient having problems with his blood pressure fluctuating and heart disease. Appointment made for 12/23 at 3 PM, patient made of appointment

## 2023-11-23 ENCOUNTER — Other Ambulatory Visit: Payer: Self-pay | Admitting: Physician Assistant

## 2023-11-23 DIAGNOSIS — R899 Unspecified abnormal finding in specimens from other organs, systems and tissues: Secondary | ICD-10-CM

## 2023-11-23 LAB — COMPREHENSIVE METABOLIC PANEL
ALT: 16 [IU]/L (ref 0–44)
AST: 20 [IU]/L (ref 0–40)
Albumin: 4.4 g/dL (ref 3.7–4.7)
Alkaline Phosphatase: 102 [IU]/L (ref 44–121)
BUN/Creatinine Ratio: 17 (ref 10–24)
BUN: 25 mg/dL (ref 8–27)
Bilirubin Total: 0.2 mg/dL (ref 0.0–1.2)
CO2: 22 mmol/L (ref 20–29)
Calcium: 9.5 mg/dL (ref 8.6–10.2)
Chloride: 107 mmol/L — ABNORMAL HIGH (ref 96–106)
Creatinine, Ser: 1.45 mg/dL — ABNORMAL HIGH (ref 0.76–1.27)
Globulin, Total: 2.2 g/dL (ref 1.5–4.5)
Glucose: 78 mg/dL (ref 70–99)
Potassium: 5 mmol/L (ref 3.5–5.2)
Sodium: 143 mmol/L (ref 134–144)
Total Protein: 6.6 g/dL (ref 6.0–8.5)
eGFR: 48 mL/min/{1.73_m2} — ABNORMAL LOW (ref >59)

## 2023-11-23 LAB — CBC WITH DIFFERENTIAL/PLATELET
Basophils Absolute: 0 10*3/uL (ref 0.0–0.2)
Basos: 0 %
EOS (ABSOLUTE): 0.5 10*3/uL — ABNORMAL HIGH (ref 0.0–0.4)
Eos: 6 %
Hematocrit: 43.5 % (ref 37.5–51.0)
Hemoglobin: 14.1 g/dL (ref 13.0–17.7)
Immature Grans (Abs): 0 10*3/uL (ref 0.0–0.1)
Immature Granulocytes: 0 %
Lymphocytes Absolute: 2.3 10*3/uL (ref 0.7–3.1)
Lymphs: 27 %
MCH: 30.3 pg (ref 26.6–33.0)
MCHC: 32.4 g/dL (ref 31.5–35.7)
MCV: 94 fL (ref 79–97)
Monocytes Absolute: 0.8 10*3/uL (ref 0.1–0.9)
Monocytes: 10 %
Neutrophils Absolute: 4.9 10*3/uL (ref 1.4–7.0)
Neutrophils: 57 %
Platelets: 283 10*3/uL (ref 150–450)
RBC: 4.65 x10E6/uL (ref 4.14–5.80)
RDW: 12.7 % (ref 11.6–15.4)
WBC: 8.6 10*3/uL (ref 3.4–10.8)

## 2023-11-23 LAB — TSH: TSH: 0.959 u[IU]/mL (ref 0.450–4.500)

## 2023-12-06 ENCOUNTER — Other Ambulatory Visit: Payer: Self-pay

## 2023-12-06 DIAGNOSIS — F411 Generalized anxiety disorder: Secondary | ICD-10-CM

## 2023-12-20 ENCOUNTER — Other Ambulatory Visit: Payer: Self-pay | Admitting: Family Medicine

## 2023-12-20 DIAGNOSIS — I110 Hypertensive heart disease with heart failure: Secondary | ICD-10-CM

## 2023-12-21 DIAGNOSIS — I6782 Cerebral ischemia: Secondary | ICD-10-CM | POA: Diagnosis not present

## 2023-12-21 DIAGNOSIS — F0390 Unspecified dementia without behavioral disturbance: Secondary | ICD-10-CM | POA: Diagnosis not present

## 2023-12-23 ENCOUNTER — Inpatient Hospital Stay: Payer: Medicare Other | Attending: Oncology

## 2023-12-23 DIAGNOSIS — Z79899 Other long term (current) drug therapy: Secondary | ICD-10-CM | POA: Diagnosis not present

## 2023-12-23 DIAGNOSIS — C61 Malignant neoplasm of prostate: Secondary | ICD-10-CM | POA: Insufficient documentation

## 2023-12-23 DIAGNOSIS — C7989 Secondary malignant neoplasm of other specified sites: Secondary | ICD-10-CM

## 2023-12-23 DIAGNOSIS — Z5111 Encounter for antineoplastic chemotherapy: Secondary | ICD-10-CM | POA: Diagnosis not present

## 2023-12-23 DIAGNOSIS — C774 Secondary and unspecified malignant neoplasm of inguinal and lower limb lymph nodes: Secondary | ICD-10-CM | POA: Diagnosis not present

## 2023-12-23 LAB — PSA: Prostatic Specific Antigen: 0.05 ng/mL (ref 0.00–4.00)

## 2023-12-24 ENCOUNTER — Inpatient Hospital Stay: Payer: Medicare Other

## 2023-12-24 ENCOUNTER — Other Ambulatory Visit: Payer: Self-pay | Admitting: Oncology

## 2023-12-24 ENCOUNTER — Inpatient Hospital Stay (HOSPITAL_BASED_OUTPATIENT_CLINIC_OR_DEPARTMENT_OTHER): Payer: Medicare Other | Admitting: Oncology

## 2023-12-24 VITALS — BP 158/75 | HR 56 | Temp 98.1°F | Resp 16 | Ht 70.0 in | Wt 194.7 lb

## 2023-12-24 DIAGNOSIS — C7989 Secondary malignant neoplasm of other specified sites: Secondary | ICD-10-CM

## 2023-12-24 DIAGNOSIS — C774 Secondary and unspecified malignant neoplasm of inguinal and lower limb lymph nodes: Secondary | ICD-10-CM | POA: Diagnosis not present

## 2023-12-24 DIAGNOSIS — C61 Malignant neoplasm of prostate: Secondary | ICD-10-CM | POA: Diagnosis not present

## 2023-12-24 DIAGNOSIS — Z79899 Other long term (current) drug therapy: Secondary | ICD-10-CM | POA: Diagnosis not present

## 2023-12-24 DIAGNOSIS — Z5111 Encounter for antineoplastic chemotherapy: Secondary | ICD-10-CM | POA: Diagnosis not present

## 2023-12-24 MED ORDER — LEUPROLIDE ACETATE (3 MONTH) 22.5 MG IM KIT
22.5000 mg | PACK | Freq: Once | INTRAMUSCULAR | Status: AC
Start: 2023-12-24 — End: 2023-12-24
  Administered 2023-12-24: 22.5 mg via INTRAMUSCULAR
  Filled 2023-12-24: qty 22.5

## 2023-12-24 NOTE — Progress Notes (Signed)
 [facility]  [address] [place],  [place]  [address] [phone]  Clinic Day:  12/24/2023  Referring physician: Sherre Clapper, MD  HISTORY OF PRESENT ILLNESS:  The patient is an 83 y.o. 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  He has been on complete androgen deprivation therapy for months, which consists of Lupron /darolutamide .  As it pertains to his prostate cancer management, other than fatigue, he denies having any side effects from his complete androgen blockade therapy.  He takes darolutamide  just 3 times per week.  He denies having any new symptoms or findings which concern him for progression of his metastatic prostate cancer.    PHYSICAL EXAM:  Blood pressure (!) 158/75, pulse (!) 56, temperature 98.1 F (36.7 C), temperature source Oral, resp. rate 16, height 5' 10 (1.778 m), weight 194 lb 11.2 oz (88.3 kg), SpO2 97%. Wt Readings from Last 3 Encounters:  12/24/23 194 lb 11.2 oz (88.3 kg)  11/22/23 192 lb 9.6 oz (87.4 kg)  10/14/23 194 lb (88 kg)   Body mass index is 27.94 kg/m. Performance status (ECOG): 1 Physical Exam Constitutional:      General: He is not in acute distress.    Appearance: Normal appearance. He is normal weight.  HENT:     Head: Normocephalic and atraumatic.  Eyes:     General: No scleral icterus.    Extraocular Movements: Extraocular movements intact.     Conjunctiva/sclera: Conjunctivae normal.     Pupils: Pupils are equal, round, and reactive to light.  Cardiovascular:     Rate and Rhythm: Normal rate and regular rhythm.     Pulses: Normal pulses.     Heart sounds: Normal heart sounds. No murmur heard.    No friction rub. No gallop.  Pulmonary:     Effort: Pulmonary effort is normal. No respiratory distress.     Breath sounds: Normal breath sounds.  Abdominal:     General: Bowel sounds are normal. There is no distension.      Palpations: Abdomen is soft. There is no hepatomegaly, splenomegaly or mass.     Tenderness: There is no abdominal tenderness.  Musculoskeletal:        General: Normal range of motion.     Cervical back: Normal range of motion and neck supple.     Right lower leg: No edema.     Left lower leg: No edema.  Lymphadenopathy:     Cervical: No cervical adenopathy.  Skin:    General: Skin is warm and dry.  Neurological:     General: No focal deficit present.     Mental Status: He is alert and oriented to person, place, and time. Mental status is at baseline.  Psychiatric:        Mood and Affect: Mood normal.        Behavior: Behavior normal.        Thought Content: Thought content normal.        Judgment: Judgment normal.    LABS:    Latest Reference Range & Units 09/22/23 11:16 12/23/23 11:31  Prostatic Specific Antigen 0.00 - 4.00 ng/mL 0.06 0.05    ASSESSMENT & PLAN:  Assessment/Plan:  An 83 y.o. male with metastatic prostate cancer.  His PSA level of .05 remains consistent with undetectable disease, which is when the level is < 0.1.  Based upon this, he will continue to take darolutamide  300  mg 3 times per week.  He will continue Lupron  shots every 3 months, including his next shot today.  I will see him back in 3 months for repeat clinical assessment. The patient understands all the plans discussed today and is in agreement with them.    Shauntae Reitman DELENA Kerns, MD

## 2023-12-24 NOTE — Patient Instructions (Signed)

## 2023-12-26 ENCOUNTER — Encounter: Payer: Self-pay | Admitting: Oncology

## 2023-12-27 DIAGNOSIS — Z955 Presence of coronary angioplasty implant and graft: Secondary | ICD-10-CM | POA: Diagnosis not present

## 2023-12-27 DIAGNOSIS — E785 Hyperlipidemia, unspecified: Secondary | ICD-10-CM | POA: Diagnosis not present

## 2023-12-27 DIAGNOSIS — I10 Essential (primary) hypertension: Secondary | ICD-10-CM | POA: Diagnosis not present

## 2023-12-27 DIAGNOSIS — I2510 Atherosclerotic heart disease of native coronary artery without angina pectoris: Secondary | ICD-10-CM | POA: Diagnosis not present

## 2023-12-29 ENCOUNTER — Other Ambulatory Visit: Payer: Self-pay | Admitting: Family Medicine

## 2023-12-29 ENCOUNTER — Other Ambulatory Visit: Payer: Self-pay

## 2024-01-08 ENCOUNTER — Other Ambulatory Visit: Payer: Self-pay

## 2024-01-08 DIAGNOSIS — F411 Generalized anxiety disorder: Secondary | ICD-10-CM

## 2024-01-09 ENCOUNTER — Other Ambulatory Visit: Payer: Self-pay | Admitting: Family Medicine

## 2024-01-25 ENCOUNTER — Other Ambulatory Visit: Payer: Self-pay

## 2024-01-25 MED ORDER — HYDROXYZINE HCL 25 MG PO TABS: 25.0000 mg | ORAL_TABLET | Freq: Three times a day (TID) | ORAL | 0 refills | Status: DC | PRN

## 2024-01-25 MED ORDER — PAROXETINE HCL 40 MG PO TABS: 40.0000 mg | ORAL_TABLET | Freq: Every day | ORAL | 0 refills | Status: DC

## 2024-01-27 DIAGNOSIS — Z955 Presence of coronary angioplasty implant and graft: Secondary | ICD-10-CM | POA: Diagnosis not present

## 2024-01-27 DIAGNOSIS — E785 Hyperlipidemia, unspecified: Secondary | ICD-10-CM | POA: Diagnosis not present

## 2024-01-27 DIAGNOSIS — I1A0 Resistant hypertension: Secondary | ICD-10-CM | POA: Diagnosis not present

## 2024-01-27 DIAGNOSIS — I2510 Atherosclerotic heart disease of native coronary artery without angina pectoris: Secondary | ICD-10-CM | POA: Diagnosis not present

## 2024-01-27 DIAGNOSIS — Z7901 Long term (current) use of anticoagulants: Secondary | ICD-10-CM | POA: Diagnosis not present

## 2024-01-27 DIAGNOSIS — I10 Essential (primary) hypertension: Secondary | ICD-10-CM | POA: Diagnosis not present

## 2024-01-27 DIAGNOSIS — R9431 Abnormal electrocardiogram [ECG] [EKG]: Secondary | ICD-10-CM | POA: Diagnosis not present

## 2024-01-27 DIAGNOSIS — Z86718 Personal history of other venous thrombosis and embolism: Secondary | ICD-10-CM | POA: Diagnosis not present

## 2024-01-27 DIAGNOSIS — I679 Cerebrovascular disease, unspecified: Secondary | ICD-10-CM | POA: Diagnosis not present

## 2024-01-31 NOTE — Progress Notes (Unsigned)
 Subjective:  Patient ID: Marcus King, male    DOB: Aug 10, 1941  Age: 83 y.o. MRN: [id number]  Chief Complaint  Patient presents with   Medical Management of Chronic Issues    HPI The patient, with a history of hypertension and memory issues, presents for a regular follow-up. The patient's primary concern is his fluctuating blood pressure, which he monitors multiple times a day. The readings range from 108/57 to 189/92. The patient's wife confirms that these fluctuations cause him significant anxiety.  The patient was recently prescribed doxazosin by another doctor, but he stopped taking it after one dose due to a significant drop in heart rate. The patient describes the experience as "horrible" and has not taken the medication since.  In addition to hypertension, the patient is also dealing with memory issues. He has been diagnosed with mild dementia and is currently under the care of a neurologist. The patient admits to struggling with remembering names and dates, and his wife confirms that he often forgets events that happened a month ago. The patient is currently on lomustine for his memory issues.  Hypertension: Taking Amlodipine 10 mg daily, Carvedilol 25 mg twice a day, lisinopril 20 mg twice daily, inspra 25 mg daily.  Started on doxazosin 1 mg before bed prescribed by cardiology. Patient had severe bradycardia and dizziness. Took one pill and had to stop it.   Hyperlipidemia: He takes Pravastatin 20 mg daily.   GAD:  Buspar 30 mg twice a day, Paroxetine 40 mg daily.  Atopic Dermatitis: Taking Dupixent inject q4 weeks, Hydroxyzine 25 mg  one before bed.  Skin is doing well. Itching is well managed.   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 Lupron Depot 3 month IM, NubeQa 300 mg 3 times per week. Sees oncology.   Pulmonary embolus BL and leg dvt occurred in 04/2020. After covid. He is on aspirin 81 mg daily.  Alzheimer's dementia: Patient was referred to Dr. Alton Revere,  neurology, who had an mri done. He is tolerating namenda 5 mg daily. They were unhappy with the care given by Dr. Alton Revere. Patient is seeing Dr. Antonietta Barcelona now. Continue namenda 10 mg twice daily. Intolerant to aricept.       07/09/2023   10:19 AM 02/09/2023   10:20 AM 07/21/2022    3:04 PM 02/04/2022   10:38 AM 09/24/2021    9:55 AM  Depression screen PHQ 2/9  Decreased Interest 0 0 0 0 0  Down, Depressed, Hopeless 0 0 0 0 0  PHQ - 2 Score 0 0 0 0 0  Altered sleeping 0      Tired, decreased energy 3      Change in appetite 0      Feeling bad or failure about yourself  0      Trouble concentrating 0      Moving slowly or fidgety/restless 3      Suicidal thoughts 0      PHQ-9 Score 6      Difficult doing work/chores Not difficult at all            02/09/2023   10:22 AM  Fall Risk   Falls in the past year? 0  Number falls in past yr: 0  Injury with Fall? 0  Risk for fall due to : No Fall Risks  Follow up Falls evaluation completed;Education provided    Patient Care Team: Blane Ohara, MD as PCP - General (Family Medicine) Weston Settle, MD as PCP - Hematology/Oncology (Oncology)  Olegario Shearer, MD as Referring Physician (Dermatology) Elvis Coil, MD as Consulting Physician (Nephrology) Diamond Nickel., MD as Referring Physician (Cardiology) Zettie Pho, [facility] (Inactive) (Pharmacist)   Review of Systems  Constitutional:  Negative for chills, diaphoresis, fatigue and fever.  HENT:  Negative for congestion, ear pain and sore throat.   Respiratory:  Negative for cough and shortness of breath.   Cardiovascular:  Negative for chest pain and leg swelling.  Gastrointestinal:  Negative for abdominal pain, constipation, diarrhea, nausea and vomiting.  Genitourinary:  Negative for dysuria and urgency.  Musculoskeletal:  Negative for arthralgias and myalgias.  Neurological:  Negative for dizziness and headaches.  Psychiatric/Behavioral:  Negative for dysphoric mood.      Current Outpatient Medications on File Prior to Visit  Medication Sig Dispense Refill   amLODipine (NORVASC) 10 MG tablet Take 1 tablet (10 mg total) by mouth daily. 90 tablet 1   aspirin EC 81 MG tablet Take 81 mg by mouth daily. Swallow whole.     busPIRone (BUSPAR) 30 MG tablet TAKE ONE TABLET BY MOUTH EVERY MORNING AND EVERY DAY AT BEDTIME 60 tablet 1   carvedilol (COREG) 25 MG tablet take 1 tablet (25 mg) by oral route 2 times per day with food 180 tablet 0   clobetasol cream (TEMOVATE) 0.05 % Apply topically 2 (two) times daily as needed.     darolutamide (NUBEQA) 300 MG tablet Take 1 tablet (300 mg total) by mouth daily. Take on MONDAY, WEDNESDAY, AND FRIDAY ONLY 48 tablet 3   Dupilumab 100 MG/0.67ML SOSY Inject into the skin.     eplerenone (INSPRA) 25 MG tablet Take 1 tablet (25 mg total) by mouth daily. 90 tablet 1   hydrOXYzine (ATARAX) 25 MG tablet Take 1 tablet (25 mg total) by mouth every 8 (eight) hours as needed. 270 tablet 0   Leuprolide Acetate, 3 Month, (LUPRON DEPOT, 39-MONTH, IM) Inject into the muscle.     lisinopril (ZESTRIL) 20 MG tablet Take 1 tablet (20 mg total) by mouth 2 (two) times daily. 180 tablet 0   loratadine (CLARITIN) 10 MG tablet Take by mouth.     memantine (NAMENDA) 10 MG tablet Take 1 tablet (10 mg total) by mouth 2 (two) times daily. 60 tablet 3   PARoxetine (PAXIL) 40 MG tablet Take 1 tablet (40 mg total) by mouth daily. 90 tablet 0   pravastatin (PRAVACHOL) 20 MG tablet Take 1 tablet (20 mg total) by mouth daily. 90 tablet 1   Vitamin D, Ergocalciferol, (DRISDOL) 1.25 MG (50000 UNIT) CAPS capsule Take 1 capsule (50,000 Units total) by mouth 2 (two) times a week. 24 capsule 1   No current facility-administered medications on file prior to visit.   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 diastolic dysfunction, NYHA class 1 (HCC) 09/21/2018   Dyslipidemia 08/11/2017   Dyspnea on exertion 04/23/2020   Edema extremities 04/14/2018   Elevated glucose 10/17/2019   Fatigue 04/23/2020   Food poisoning    GAD (generalized anxiety disorder)    H/O deep venous thrombosis 04/14/2021   Headache    sinus   History of pulmonary embolism 06/13/2020   History of pulmonary embolus (PE) 06/13/2020   Hypertension    Hypertensive heart disease 08/05/2017   Hypoxia 10/17/2019  Inguinal lymphadenopathy 05/26/2020   LVH (left ventricular hypertrophy) due to hypertensive disease 08/11/2017   Malignant neoplasm of prostate (HCC) 09/09/2020   Mixed hyperlipidemia    Orthostatic hypotension    Orthostatic syncope 10/17/2019   OSA on CPAP    Prostate cancer (HCC)    Pulmonary embolism (HCC) 04/2020   PVC's (premature ventricular contractions) 05/18/2018   Rectal mass 05/24/2020   S/P TKR (total knee replacement) using cement, 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   Solid nodule of lung greater than 8 mm in diameter 05/26/2020   Past Surgical History:  Procedure Laterality Date   CARDIAC CATHETERIZATION  07/17/2022   CATARACT EXTRACTION W/ INTRAOCULAR LENS  IMPLANT, BILATERAL Bilateral    COLON SURGERY  2014   COLOSTOMY REVERSAL  2014   HERNIA REPAIR     x2   JOINT REPLACEMENT     PROSTATE SURGERY  2006   SEPTOPLASTY     TOTAL KNEE ARTHROPLASTY Left 09/28/2016   Procedure: TOTAL KNEE ARTHROPLASTY;  Surgeon: Dannielle Huh, MD;  Location: [facility] OR;  Service: Orthopedics;  Laterality: Left;    Family History  Problem Relation Age of Onset   Stroke Mother    Parkinson's disease Mother    Lung disease Father    Stroke Father    Alcoholism Father    Leukemia Sister    Muscular dystrophy Son        Becker's   Alzheimer's disease Neg Hx    Social History   Socioeconomic History   Marital status: Married    Spouse name: Eber Jones   Number of children: 2   Years of education: Not on file    Highest education level: High school graduate  Occupational History   Occupation: Merchant navy officer    Comment: ICDs/Wound Care  Tobacco Use   Smoking status: Former    Current packs/day: 0.00    Types: Cigarettes    Start date: 1959    Quit date: 1979    Years since quitting: 46.1   Smokeless tobacco: Never  Vaping Use   Vaping status: Never Used  Substance and Sexual Activity   Alcohol use: No   Drug use: Never   Sexual activity: Not on file  Other Topics Concern   Not on file  Social History Narrative   LIves at home with his wife   Daughter lives in [facility], Son lives in [place]   Right handed   Drinks about 2 cups of caffeine daily   Social Drivers of Health   Financial Resource Strain: Low Risk  (03/18/2023)   Overall Financial Resource Strain (CARDIA)    Difficulty of Paying Living Expenses: Not hard at all  Food Insecurity: No Food Insecurity (03/18/2023)   Hunger Vital Sign    Worried About Running Out of Food in the Last Year: Never true    Ran Out of Food in the Last Year: Never true  Transportation Needs: No Transportation Needs (10/14/2023)   PRAPARE - Administrator, Civil Service (Medical): No    Lack of Transportation (Non-Medical): No  Physical Activity: Inactive (03/18/2023)   Exercise Vital Sign    Days of Exercise per Week: 0 days    Minutes of Exercise per Session: 0 min  Stress: No Stress Concern Present (03/18/2023)   [employer] of Occupational Health - Occupational Stress Questionnaire    Feeling of Stress : Not at all  Social Connections: Socially Integrated (03/18/2023)   Social Connection and Isolation Panel [NHANES]    Frequency  of Communication with Friends and Family: More than three times a week    Frequency of Social Gatherings with Friends and Family: Once a week    Attends Religious Services: More than 4 times per year    Active Member of [employer] or Organizations: No    Attends Engineer, structural: More  than 4 times per year    Marital Status: Married    Objective:  BP 128/72   Pulse 62   Temp (!) 97.5 F (36.4 C)   Ht 5\' 10"  (1.778 m)   Wt 194 lb (88 kg)   SpO2 96%   BMI 27.84 kg/m      02/01/2024    9:41 AM 12/24/2023   11:15 AM 11/22/2023    1:23 PM  BP/Weight  Systolic BP 128 158 110  Diastolic BP 72 75 40  Wt. (Lbs) 194 194.7 192.6  BMI 27.84 kg/m2 27.94 kg/m2 27.64 kg/m2    Physical Exam Vitals reviewed.  Constitutional:      Appearance: Normal appearance.  Neck:     Vascular: No carotid bruit.  Cardiovascular:     Rate and Rhythm: Normal rate and regular rhythm.     Heart sounds: Normal heart sounds.  Pulmonary:     Effort: Pulmonary effort is normal.     Breath sounds: Normal breath sounds. No wheezing, rhonchi or rales.  Abdominal:     General: Bowel sounds are normal.     Palpations: Abdomen is soft.     Tenderness: There is no abdominal tenderness.  Neurological:     Mental Status: He is alert.  Psychiatric:        Mood and Affect: Mood normal.        Behavior: Behavior normal.     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 Severe hypotension with doxazosin 1mg  daily, which was discontinued. Currently on carvedilol (partial dose in the morning, full dose at night) and amlodipine 10mg   daily. -Continue current regimen of carvedilol and amlodipine. -Reduce frequency of home blood pressure monitoring to twice daily and only additional checks if feeling unwell. -Referral to hypertension specialist (Dr. Shawnee Knapp) in progress instigated by cardiology.  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 We have also ordered a blood test for Alzheimer's disease and scheduled a follow-up appointment with Dr. Antonietta Barcelona in April 2025.  Orders: -     ATN PROFILE  Mixed hyperlipidemia Assessment & Plan: Continue Pravastatin 20 mg daily.   Recommend continue to work on eating healthy diet and exercise. Check lipid panel.  Orders: -     Lipid panel  GAD (generalized anxiety disorder) Assessment & Plan: Continue Buspar 30 mg twice daily. Paroxetine 40 mg daily.   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 Management per specialist.        No orders of the defined types were placed in this encounter.   Orders Placed This Encounter  Procedures   Lipid panel   Comprehensive metabolic panel   ATN PROFILE     Follow-up: Return in about 3 months (around 04/30/2024) for chronic follow up.   I,Marla I Leal-Borjas,acting as a scribe for Blane Ohara, MD.,have documented all relevant documentation on the behalf of Blane Ohara, MD,as directed by  Blane Ohara, MD while in the presence of Blane Ohara, MD.   An After Visit Summary was printed and given to the patient.  I attest that I have reviewed this visit and agree with the plan scribed by my staff.   Blane Ohara, MD [facility] [phone]

## 2024-02-01 ENCOUNTER — Encounter: Payer: Self-pay | Admitting: Family Medicine

## 2024-02-01 ENCOUNTER — Ambulatory Visit (INDEPENDENT_AMBULATORY_CARE_PROVIDER_SITE_OTHER): Payer: Medicare Other | Admitting: Family Medicine

## 2024-02-01 VITALS — BP 128/72 | HR 62 | Temp 97.5°F | Ht 70.0 in | Wt 194.0 lb

## 2024-02-01 DIAGNOSIS — G301 Alzheimer's disease with late onset: Secondary | ICD-10-CM

## 2024-02-01 DIAGNOSIS — C61 Malignant neoplasm of prostate: Secondary | ICD-10-CM | POA: Diagnosis not present

## 2024-02-01 DIAGNOSIS — I5089 Other heart failure: Secondary | ICD-10-CM | POA: Diagnosis not present

## 2024-02-01 DIAGNOSIS — E782 Mixed hyperlipidemia: Secondary | ICD-10-CM | POA: Diagnosis not present

## 2024-02-01 DIAGNOSIS — C7989 Secondary malignant neoplasm of other specified sites: Secondary | ICD-10-CM | POA: Diagnosis not present

## 2024-02-01 DIAGNOSIS — F03B4 Unspecified dementia, moderate, with anxiety: Secondary | ICD-10-CM | POA: Diagnosis not present

## 2024-02-01 DIAGNOSIS — F411 Generalized anxiety disorder: Secondary | ICD-10-CM | POA: Diagnosis not present

## 2024-02-01 DIAGNOSIS — I110 Hypertensive heart disease with heart failure: Secondary | ICD-10-CM

## 2024-02-01 DIAGNOSIS — F02B3 Dementia in other diseases classified elsewhere, moderate, with mood disturbance: Secondary | ICD-10-CM | POA: Diagnosis not present

## 2024-02-01 DIAGNOSIS — I5030 Unspecified diastolic (congestive) heart failure: Secondary | ICD-10-CM

## 2024-02-01 NOTE — Patient Instructions (Signed)
 VISIT SUMMARY:  During your visit today, we discussed your fluctuating blood pressure and memory issues. We reviewed your current medications and made some adjustments to your monitoring routine. We also planned follow-up tests and referrals to ensure comprehensive care.  YOUR PLAN:  -HYPERTENSION: Hypertension, or high blood pressure, means that the force of the blood against your artery walls is too high. We will continue your current medications, lisinopril, carvedilol, eplerenone, and amlodipine, but you should reduce the frequency of your home blood pressure monitoring to twice daily and only check more often if you feel unwell. A referral to a hypertension specialist, Dr. Shawnee Knapp, is in progress.  -MILD DEMENTIA: Mild dementia is a condition that affects memory and cognitive function. You will continue your current treatment NAMENDA. We have also ordered a blood test for Alzheimer's disease and scheduled a follow-up appointment with Dr. Antonietta Barcelona in April 2025.  -KIDNEY FUNCTION: There was a slight impairment in your kidney function noted in previous blood work. We will repeat the kidney function tests today to monitor your condition.  INSTRUCTIONS:  Please follow up with the hypertension specialist, Dr. Shawnee Knapp, as soon as the referral is processed. Continue with your current medications and monitoring routine as discussed.

## 2024-02-02 DIAGNOSIS — F03B4 Unspecified dementia, moderate, with anxiety: Secondary | ICD-10-CM | POA: Insufficient documentation

## 2024-02-05 NOTE — Assessment & Plan Note (Signed)
 Continue carvedilol 25 mg three times a day, lisinopril 20 mg twice daily, furosemide 20 mg daily, and eplerenone 25 mg daily.

## 2024-02-05 NOTE — Assessment & Plan Note (Signed)
 Continue your current treatment NAMENDA. We have also ordered a blood test for Alzheimer's disease and scheduled a follow-up appointment with Dr. Antonietta Barcelona in April 2025.

## 2024-02-05 NOTE — Assessment & Plan Note (Signed)
Continue Pravastatin 20 mg daily.   Recommend continue to work on eating healthy diet and exercise. Check lipid panel.

## 2024-02-05 NOTE — Assessment & Plan Note (Signed)
 Uncontrolled with frequent home monitoring. Severe hypotension with doxazosin 1mg  daily, which was discontinued. Currently on carvedilol (partial dose in the morning, full dose at night) and amlodipine 10mg  daily. -Continue current regimen of carvedilol and amlodipine. -Reduce frequency of home blood pressure monitoring to twice daily and only additional checks if feeling unwell. -Referral to hypertension specialist (Dr. Shawnee Knapp) in progress instigated by cardiology.

## 2024-02-05 NOTE — Assessment & Plan Note (Signed)
 Continue Buspar 30 mg twice daily. Paroxetine 40 mg daily.

## 2024-02-05 NOTE — Assessment & Plan Note (Signed)
 The current medical regimen is effective;  continue present plan and medications.

## 2024-02-06 NOTE — Assessment & Plan Note (Signed)
 Continue current medications.  Management per specialist.

## 2024-02-09 ENCOUNTER — Other Ambulatory Visit: Payer: Self-pay

## 2024-02-09 DIAGNOSIS — F02B3 Dementia in other diseases classified elsewhere, moderate, with mood disturbance: Secondary | ICD-10-CM

## 2024-02-09 LAB — COMPREHENSIVE METABOLIC PANEL
ALT: 12 IU/L (ref 0–44)
AST: 18 IU/L (ref 0–40)
Albumin: 4.4 g/dL (ref 3.7–4.7)
Alkaline Phosphatase: 102 IU/L (ref 44–121)
BUN/Creatinine Ratio: 19 (ref 10–24)
BUN: 22 mg/dL (ref 8–27)
Bilirubin Total: 0.3 mg/dL (ref 0.0–1.2)
CO2: 23 mmol/L (ref 20–29)
Calcium: 9.8 mg/dL (ref 8.6–10.2)
Chloride: 106 mmol/L (ref 96–106)
Creatinine, Ser: 1.17 mg/dL (ref 0.76–1.27)
Globulin, Total: 2.3 g/dL (ref 1.5–4.5)
Glucose: 76 mg/dL (ref 70–99)
Potassium: 5.2 mmol/L (ref 3.5–5.2)
Sodium: 144 mmol/L (ref 134–144)
Total Protein: 6.7 g/dL (ref 6.0–8.5)
eGFR: 62 mL/min/{1.73_m2} (ref >59)

## 2024-02-09 LAB — LIPID PANEL
Chol/HDL Ratio: 3.1 ratio (ref 0.0–5.0)
Cholesterol, Total: 148 mg/dL (ref 100–199)
HDL: 48 mg/dL (ref >39)
LDL Chol Calc (NIH): 77 mg/dL (ref 0–99)
Triglycerides: 129 mg/dL (ref 0–149)
VLDL Cholesterol Cal: 23 mg/dL (ref 5–40)

## 2024-02-09 LAB — ATN PROFILE
A -- Beta-amyloid 42/40 Ratio: 0.122 (ref >0.102)
Beta-amyloid 40: 230.61 pg/mL
Beta-amyloid 42: 28.14 pg/mL
N -- NfL, Plasma: 6.34 pg/mL (ref 0.00–11.55)
T -- p-tau181: 1.37 pg/mL — ABNORMAL HIGH (ref 0.00–0.97)

## 2024-02-09 MED ORDER — MEMANTINE HCL 10 MG PO TABS: 10.0000 mg | ORAL_TABLET | Freq: Two times a day (BID) | ORAL | 3 refills | Status: DC

## 2024-02-09 NOTE — Telephone Encounter (Signed)
 Refill sent to pharmacy.

## 2024-02-11 ENCOUNTER — Encounter: Payer: Self-pay | Admitting: Family Medicine

## 2024-02-13 ENCOUNTER — Other Ambulatory Visit: Payer: Self-pay | Admitting: Family Medicine

## 2024-02-23 DIAGNOSIS — L570 Actinic keratosis: Secondary | ICD-10-CM | POA: Diagnosis not present

## 2024-02-23 DIAGNOSIS — C44311 Basal cell carcinoma of skin of nose: Secondary | ICD-10-CM | POA: Diagnosis not present

## 2024-02-23 DIAGNOSIS — L578 Other skin changes due to chronic exposure to nonionizing radiation: Secondary | ICD-10-CM | POA: Diagnosis not present

## 2024-02-23 DIAGNOSIS — L814 Other melanin hyperpigmentation: Secondary | ICD-10-CM | POA: Diagnosis not present

## 2024-02-23 DIAGNOSIS — L821 Other seborrheic keratosis: Secondary | ICD-10-CM | POA: Diagnosis not present

## 2024-02-25 ENCOUNTER — Other Ambulatory Visit: Payer: Self-pay

## 2024-02-29 ENCOUNTER — Ambulatory Visit (INDEPENDENT_AMBULATORY_CARE_PROVIDER_SITE_OTHER): Payer: Medicare Other | Admitting: Family Medicine

## 2024-02-29 ENCOUNTER — Encounter: Payer: Self-pay | Admitting: Family Medicine

## 2024-02-29 VITALS — BP 147/70 | HR 61

## 2024-02-29 DIAGNOSIS — Z0000 Encounter for general adult medical examination without abnormal findings: Secondary | ICD-10-CM

## 2024-02-29 NOTE — Progress Notes (Signed)
 Subjective:   Marcus King is a 83 y.o. male who presents for Medicare Annual/Subsequent preventive examination.  Visit Complete: Virtual I connected with  Azalee Course on 02/29/24 by a audio enabled telemedicine application and verified that I am speaking with the correct person using two identifiers.  Patient Location: Home  Provider Location: Office/Clinic  I discussed the limitations of evaluation and management by telemedicine. The patient expressed understanding and agreed to proceed.  Vital Signs: Because this visit was a virtual/telehealth visit, some criteria may be missing or patient reported. Any vitals not documented were not able to be obtained and vitals that have been documented are patient reported.  Patient Medicare AWV questionnaire was completed by the patient on 02/29/2024; I have confirmed that all information answered by patient is correct and no changes since this date.        Objective:    Today's Vitals   02/29/24 1310  BP: (!) 147/70  Pulse: 61   There is no height or weight on file to calculate BMI.     12/24/2023   11:28 AM 09/29/2023   10:42 AM 09/24/2023   12:21 PM 06/25/2023   11:34 AM 06/25/2023   11:22 AM 03/26/2023   12:11 PM 03/26/2023   12:09 PM  Advanced Directives  Does Patient Have a Medical Advance Directive? Yes Yes Yes Yes Yes Yes Yes  Type of Aeronautical engineer of [place];Living will       Current Medications (verified) Outpatient Encounter Medications as of 02/29/2024  Medication Sig   amLODipine (NORVASC) 10 MG tablet Take 1 tablet (10 mg total) by mouth daily. (Patient taking differently: Take 10 mg by mouth 2 (two) times daily.)   aspirin EC 81 MG tablet Take 81 mg by mouth daily. Swallow whole.   busPIRone (BUSPAR) 30 MG tablet TAKE ONE TABLET BY MOUTH EVERY MORNING AND EVERY DAY AT BEDTIME   carvedilol (COREG) 25 MG tablet take 1 tablet (25 mg) by oral route 2 times per day with food    darolutamide (NUBEQA) 300 MG tablet Take 1 tablet (300 mg total) by mouth daily. Take on MONDAY, WEDNESDAY, AND FRIDAY ONLY   doxazosin (CARDURA) 1 MG tablet Take 1 mg by mouth at bedtime.   eplerenone (INSPRA) 25 MG tablet Take 1 tablet (25 mg total) by mouth daily.   hydrOXYzine (ATARAX) 25 MG tablet Take 1 tablet (25 mg total) by mouth every 8 (eight) hours as needed.   Leuprolide Acetate, 3 Month, (LUPRON DEPOT, 20-MONTH, IM) Inject into the muscle.   lisinopril (ZESTRIL) 20 MG tablet Take 1 tablet (20 mg total) by mouth 2 (two) times daily.   memantine (NAMENDA) 10 MG tablet Take 1 tablet (10 mg total) by mouth 2 (two) times daily.   PARoxetine (PAXIL) 40 MG tablet Take 1 tablet (40 mg total) by mouth daily.   pravastatin (PRAVACHOL) 20 MG tablet Take 1 tablet (20 mg total) by mouth daily.   Vitamin D, Ergocalciferol, (DRISDOL) 1.25 MG (50000 UNIT) CAPS capsule Take 1 capsule (50,000 Units total) by mouth 2 (two) times a week.   clobetasol cream (TEMOVATE) 0.05 % Apply topically 2 (two) times daily as needed. (Patient not taking: Reported on 02/29/2024)   Dupilumab 100 MG/0.67ML SOSY Inject into the skin.   [DISCONTINUED] loratadine (CLARITIN) 10 MG tablet Take by mouth. (Patient not taking: Reported on 02/29/2024)   No facility-administered encounter medications on file as of 02/29/2024.    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, NYHA class 1 (HCC) 09/21/2018   Dyslipidemia 08/11/2017   Dyspnea on exertion 04/23/2020   Edema extremities 04/14/2018   Elevated glucose 10/17/2019   Fatigue 04/23/2020   Food poisoning    GAD (generalized anxiety disorder)     H/O deep venous thrombosis 04/14/2021   Headache    sinus   History of pulmonary embolism 06/13/2020   History of pulmonary embolus (PE) 06/13/2020   Hypertension    Hypertensive heart disease 08/05/2017   Hypoxia 10/17/2019   Inguinal lymphadenopathy 05/26/2020   LVH (left ventricular hypertrophy) due to hypertensive disease 08/11/2017   Malignant neoplasm of prostate (HCC) 09/09/2020   Mixed hyperlipidemia    Orthostatic hypotension    Orthostatic syncope 10/17/2019   OSA on CPAP    Prostate cancer (HCC)    Pulmonary embolism (HCC) 04/2020   PVC's (premature ventricular contractions) 05/18/2018   Rectal mass 05/24/2020   S/P TKR (total knee replacement) using cement, 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   Solid nodule of lung greater than 8 mm in diameter 05/26/2020   Past Surgical History:  Procedure Laterality Date   CARDIAC CATHETERIZATION  07/17/2022   CATARACT EXTRACTION W/ INTRAOCULAR LENS  IMPLANT, BILATERAL Bilateral    COLON SURGERY  2014   COLOSTOMY REVERSAL  2014   HERNIA REPAIR     x2   JOINT REPLACEMENT     PROSTATE SURGERY  2006   SEPTOPLASTY     TOTAL KNEE ARTHROPLASTY Left 09/28/2016   Procedure: TOTAL KNEE ARTHROPLASTY;  Surgeon: Dannielle Huh, MD;  Location: [facility] OR;  Service: Orthopedics;  Laterality: Left;   Family History  Problem Relation Age of Onset   Stroke Mother    Parkinson's disease Mother    Lung disease Father    Stroke Father    Alcoholism Father    Leukemia Sister    Muscular dystrophy Son        Becker's   Alzheimer's disease Neg Hx    Social History   Socioeconomic History   Marital status: Married    Spouse name: Eber Jones   Number of children: 2   Years of education: Not on file   Highest education level: High school graduate  Occupational History   Occupation: Merchant navy officer    Comment: ICDs/Wound Care  Tobacco Use   Smoking status: Former    Current packs/day: 0.00    Types: Cigarettes    Start date: 1959    Quit date: 1979    Years since quitting:  46.2   Smokeless tobacco: Never  Vaping Use   Vaping status: Never Used  Substance and Sexual Activity   Alcohol use: No   Drug use: Never   Sexual activity: Not on file  Other Topics Concern   Not on file  Social History Narrative   LIves at home with his wife   Daughter lives in [facility], Son lives in [place]   Right handed   Drinks about 2 cups of caffeine daily   Social Drivers of Health   Financial Resource Strain: Low Risk  (03/18/2023)   Overall Financial Resource Strain (CARDIA)    Difficulty of Paying Living Expenses: Not hard at all  Food Insecurity: No Food Insecurity (03/18/2023)   Hunger Vital Sign    Worried About Running Out of Food in the Last Year: Never true    Ran Out of Food in the Last Year: Never true  Transportation Needs: No Transportation Needs (10/14/2023)   PRAPARE - Administrator, Civil Service (Medical): No    Lack of Transportation (Non-Medical): No  Physical Activity: Inactive (03/18/2023)   Exercise Vital Sign    Days of Exercise per Week: 0 days    Minutes of Exercise per Session: 0 min  Stress: No Stress Concern Present (03/18/2023)   [employer] of Occupational Health - Occupational Stress Questionnaire    Feeling of Stress : Not at all  Social Connections: Moderately Integrated (03/18/2023)   Social Connection and Isolation Panel [NHANES]    Frequency of Communication with Friends and Family: More than three times a week    Frequency of Social Gatherings with Friends and Family: Once a week    Attends Religious Services: More than 4 times per year    Active Member of [employer] or Organizations: No    Attends Engineer, structural: Not on file    Marital Status: Married    Tobacco Counseling Counseling given: Not Answered   Clinical Intake:  Pre-visit preparation completed: Yes  Pain : No/denies pain     Diabetes: No  How often do you need to have someone help you when you read instructions, pamphlets, or  other written materials from your doctor or pharmacy?: 1 - Never  Interpreter Needed?: No    Activities of Daily Living    02/29/2024    1:25 PM  In your present state of health, do you have any difficulty performing the following activities:  Hearing? 1  Comment slight problem  Vision? 0  Difficulty concentrating or making decisions? 0  Walking or climbing stairs? 1  Comment a little wobbley  Dressing or bathing? 0  Doing errands, shopping? 0  Preparing Food and eating ? N  Using the Toilet? N  In the past six months, have you accidently leaked urine? Y  Comment wears briefs  Do you have problems with loss of bowel control? N  Managing your Medications? N  Managing your Finances? N  Housekeeping or managing your Housekeeping? N    Patient Care Team: Blane Ohara, MD as PCP - General (Family Medicine) Weston Settle, MD as PCP - Hematology/Oncology (Oncology) Olegario Shearer, MD as Referring Physician (Dermatology) Elvis Coil, MD as Consulting Physician (Nephrology) Diamond Nickel., MD as Referring Physician (Cardiology) Zettie Pho, [facility] (Inactive) (Pharmacist)  Indicate any recent Medical Services you may have received from other than [facility] providers in the past year (date may be approximate).     Assessment:   This is a routine wellness examination for Marcus King.  Hearing/Vision screen No results found.   Goals Addressed             This Visit's Progress    Health Literacy Improved       Evidence-based guidance:  Assess health literacy using Institute of Medicine recommended questions or standardized tools.  Use a universal method with health literacy to esure a nonjudgmental approach to varying levels of literacy.  Identify barriers to seeking or understanding information.  Note: Patient barriers may include literacy, language or culture, mental health, stigma, embarrassment; clinician barriers may include avoidance, time constraint, stereotype or  bias.  Collaborate with interprofessional team and child and parent/caregiver to  develop a structured yet individualized education plan that supports shared decision-making.  Consider a combination of strategies such as print, audiotape, video and computer-based learning in a group or individual setting.  Encourage patient to seek health information and education in the community such as Engineering geologist, continuing education, English as a second language class, support group or peer Control and instrumentation engineer.  Provide educational materials that are written in plain language (3rd to 5th grade reading level) and include icons, pictures and tables; provide essential information first.   Notes:  Patient says "He's just trying to stay alive."     Pharmacy Care Plan   On track    CARE PLAN ENTRY  Current Barriers:  Chronic Disease Management support, education, and care coordination needs related to Hypertension and Hyperlipidemia   Hypertension Pharmacist Clinical Goal(s): Over the next 90 days, patient will work with PharmD and providers to maintain BP goal <140/90 Current regimen:  Carvedilol 25 mg bid, Furosemide 20 mg daily, Amlodipine 5 mg daily Interventions: Recommend patient keep up the good work controlling salt intake.  Recommend patient keep up the good work walking for exercise.  Patient self care activities - Over the next 90 days, patient will: Check BP twice daily, document, and provide at future appointments Ensure daily salt intake < 2300 mg/day  Hyperlipidemia Pharmacist Clinical Goal(s): Over the next 90 days, patient will work with PharmD and providers to maintain LDL goal < 100 Current regimen:  Pravastatin 10 mg daily Interventions: Recommend patient continue to walk for exercise.  Recommend patient keep up the good work eating a heart healhty diet.  Patient self care activities - Over the next 90 days, patient will: Continue to do a great job taking medication as prescribed.   Medication management Pharmacist Clinical Goal(s): Over the next 90 days, patient will work with PharmD and providers to maintain optimal medication adherence Current pharmacy: [facility] Comprehensive medication review performed. Continue current medication management strategy Patient self care activities - Over the next 90 days, patient will: Focus on medication adherence by continuing to use pill box.  Take medications as prescribed Report any questions or concerns to PharmD and/or provider(s)  Initial goal documentation       Depression Screen    02/29/2024    1:22 PM 07/09/2023   10:19 AM 02/09/2023   10:20 AM 07/21/2022    3:04 PM 02/04/2022   10:38 AM 09/24/2021    9:55 AM 11/27/2020    2:09 PM  PHQ 2/9 Scores  PHQ - 2 Score 0 0 0 0 0 0 0  PHQ- 9 Score  6         Fall Risk    02/29/2024    1:20 PM 02/09/2023   10:22 AM 07/21/2022    3:03 PM 02/04/2022   10:43 AM 09/24/2021    9:54 AM  Fall Risk   Falls in the past year? 0 0 0 0 0  Number falls in past yr: 0 0 0 0 0  Injury with Fall? 0 0 0 0 0  Risk for fall due to : No Fall Risks No Fall Risks Impaired balance/gait Impaired balance/gait No Fall Risks  Follow up Falls evaluation completed Falls evaluation completed;Education provided Falls evaluation completed Falls evaluation completed;Falls prevention discussed;Education provided Falls evaluation completed    MEDICARE RISK AT HOME: Medicare Risk at Home Any stairs in or around the home?: Yes If so, are there any without handrails?: Yes Home free of loose throw rugs in walkways,  pet beds, electrical cords, etc?: Yes Adequate lighting in your home to reduce risk of falls?: Yes Life alert?: No Use of a cane, walker or w/c?: No Grab bars in the bathroom?: No Shower chair or bench in shower?: Yes Elevated toilet seat or a handicapped toilet?: No  TIMED UP AND GO:  Was the test performed?  No    Cognitive Function:    02/29/2024    1:33 PM  01/23/2022   11:55 AM 12/01/2017   12:38 PM  MMSE - Mini Mental State Exam  Not completed: Unable to complete    Orientation to time  5 5  Orientation to Place  5 5  Registration  3 3  Attention/ Calculation  4 5  Recall  2 1  Language- name 2 objects  2 2  Language- repeat  1 1  Language- follow 3 step command  3 3  Language- read & follow direction  1 1  Write a sentence  1 1  Copy design  1 1  Total score  28 28        02/29/2024    1:22 PM 02/09/2023   10:24 AM 02/04/2022   10:46 AM 11/27/2020    2:16 PM  6CIT Screen  What Year? 0 points 0 points 0 points 0 points  What month? 0 points 0 points 0 points 0 points  What time? 0 points 0 points 0 points 0 points  Count back from 20 0 points 0 points 0 points 2 points  Months in reverse 0 points 2 points 2 points 0 points  Repeat phrase 0 points 2 points 4 points 10 points  Total Score 0 points 4 points 6 points 12 points    Immunizations Immunization History  Administered Date(s) Administered   Fluad Quad(high Dose 65+) 09/25/2020, 09/24/2021, 08/20/2022   Fluad Trivalent(High Dose 65+) 10/14/2023   Influenza-Unspecified 09/07/2016, 09/13/2018   Janssen (J&J) SARS-COV-2 Vaccination 03/12/2020   Moderna Covid-19 Vaccine Bivalent Booster 3yrs & up 09/24/2021   Pfizer(Comirnaty)Fall Seasonal Vaccine 12 years and older 12/16/2022   Pneumococcal Conjugate-13 11/11/2015   Pneumococcal Polysaccharide-23 12/11/2009, 12/26/2012   Respiratory Syncytial Virus Vaccine,Recomb Aduvanted(Arexvy) 01/27/2024   Td 12/11/2009   Tdap 02/16/2022   Zoster Recombinant(Shingrix) 09/23/2018, 11/29/2018    TDAP status: Up to date  Flu Vaccine status: Up to date  Pneumococcal vaccine status: Up to date  Covid-19 vaccine status: Information provided on how to obtain vaccines.   Qualifies for Shingles Vaccine? Yes   Zostavax completed Yes   Shingrix Completed?: Yes  Screening Tests Health Maintenance  Topic Date Due   COVID-19  Vaccine (4 - 2024-25 season) 08/15/2023   Medicare Annual Wellness (AWV)  02/28/2025   DTaP/Tdap/Td (3 - Td or Tdap) 02/17/2032   Pneumonia Vaccine 37+ Years old  Completed   INFLUENZA VACCINE  Completed   Zoster Vaccines- Shingrix  Completed   HPV VACCINES  Aged Out    Health Maintenance  Health Maintenance Due  Topic Date Due   COVID-19 Vaccine (4 - 2024-25 season) 08/15/2023    Colorectal cancer screening: No longer required.   Lung Cancer Screening: (Low Dose CT Chest recommended if Age 33-80 years, 20 pack-year currently smoking OR have quit w/in 15years.) does qualify.   Lung Cancer Screening Referral: completed 01/22/24  Additional Screening:  Hepatitis C Screening: does not qualify;   Vision Screening: Recommended annual ophthalmology exams for early detection of glaucoma and other disorders of the eye. Is the patient up to date  with their annual eye exam?  Yes  Who is the provider or what is the name of the office in which the patient attends annual eye exams? Dr Brunetta Jeans If pt is not established with a provider, would they like to be referred to a provider to establish care?  Already has a Restaurant manager, fast food .   Dental Screening: Recommended annual dental exams for proper oral hygiene   Community Resource Referral / Chronic Care Management: CRR required this visit?  No   CCM required this visit?  No     Plan:     I have personally reviewed and noted the following in the patient's chart:   Medical and social history Use of alcohol, tobacco or illicit drugs  Current medications and supplements including opioid prescriptions. Patient is not currently taking opioid prescriptions. Functional ability and status Nutritional status Physical activity Advanced directives List of other physicians Hospitalizations, surgeries, and ER visits in previous 12 months Vitals Screenings to include cognitive, depression, and falls Referrals and appointments  In addition, I have  reviewed and discussed with patient certain preventive protocols, quality metrics, and best practice recommendations. A written personalized care plan for preventive services as well as general preventive health recommendations were provided to patient.     Lajuana Matte, FNP [facility] [phone]   02/29/2024   After Visit Summary: (Declined) Due to this being a telephonic visit, with patients personalized plan was offered to patient but patient Declined AVS at this time

## 2024-03-01 DIAGNOSIS — C44311 Basal cell carcinoma of skin of nose: Secondary | ICD-10-CM | POA: Diagnosis not present

## 2024-03-03 ENCOUNTER — Other Ambulatory Visit: Payer: Self-pay

## 2024-03-03 DIAGNOSIS — F411 Generalized anxiety disorder: Secondary | ICD-10-CM

## 2024-03-03 MED ORDER — BUSPIRONE HCL 30 MG PO TABS: 30.0000 mg | ORAL_TABLET | Freq: Two times a day (BID) | ORAL | 2 refills | Status: DC

## 2024-03-12 ENCOUNTER — Other Ambulatory Visit: Payer: Self-pay | Admitting: Family Medicine

## 2024-03-12 DIAGNOSIS — I110 Hypertensive heart disease with heart failure: Secondary | ICD-10-CM

## 2024-03-13 ENCOUNTER — Other Ambulatory Visit: Payer: Self-pay

## 2024-03-13 MED ORDER — AMLODIPINE BESYLATE 10 MG PO TABS: 10.0000 mg | ORAL_TABLET | Freq: Two times a day (BID) | ORAL | 2 refills | Status: AC

## 2024-03-22 ENCOUNTER — Inpatient Hospital Stay: Payer: Medicare Other | Attending: Oncology

## 2024-03-22 DIAGNOSIS — Z5111 Encounter for antineoplastic chemotherapy: Secondary | ICD-10-CM | POA: Insufficient documentation

## 2024-03-22 DIAGNOSIS — Z79899 Other long term (current) drug therapy: Secondary | ICD-10-CM | POA: Diagnosis not present

## 2024-03-22 DIAGNOSIS — C61 Malignant neoplasm of prostate: Secondary | ICD-10-CM | POA: Insufficient documentation

## 2024-03-22 DIAGNOSIS — C774 Secondary and unspecified malignant neoplasm of inguinal and lower limb lymph nodes: Secondary | ICD-10-CM | POA: Insufficient documentation

## 2024-03-22 LAB — PSA: Prostatic Specific Antigen: 0.06 ng/mL (ref 0.00–4.00)

## 2024-03-23 ENCOUNTER — Inpatient Hospital Stay: Payer: Medicare Other

## 2024-03-23 ENCOUNTER — Inpatient Hospital Stay (HOSPITAL_BASED_OUTPATIENT_CLINIC_OR_DEPARTMENT_OTHER): Payer: Medicare Other | Admitting: Oncology

## 2024-03-23 ENCOUNTER — Other Ambulatory Visit: Payer: Self-pay | Admitting: Oncology

## 2024-03-23 VITALS — BP 133/84 | HR 61 | Temp 98.2°F | Resp 18 | Ht 70.0 in | Wt 190.5 lb

## 2024-03-23 DIAGNOSIS — C61 Malignant neoplasm of prostate: Secondary | ICD-10-CM

## 2024-03-23 DIAGNOSIS — C774 Secondary and unspecified malignant neoplasm of inguinal and lower limb lymph nodes: Secondary | ICD-10-CM | POA: Diagnosis not present

## 2024-03-23 DIAGNOSIS — C7989 Secondary malignant neoplasm of other specified sites: Secondary | ICD-10-CM | POA: Diagnosis not present

## 2024-03-23 DIAGNOSIS — Z79899 Other long term (current) drug therapy: Secondary | ICD-10-CM | POA: Diagnosis not present

## 2024-03-23 DIAGNOSIS — Z5111 Encounter for antineoplastic chemotherapy: Secondary | ICD-10-CM | POA: Diagnosis not present

## 2024-03-23 MED ORDER — LEUPROLIDE ACETATE (3 MONTH) 22.5 MG IM KIT
22.5000 mg | PACK | Freq: Once | INTRAMUSCULAR | Status: AC
  Administered 2024-03-23: 22.5 mg via INTRAMUSCULAR
  Filled 2024-03-23: qty 22.5

## 2024-03-23 NOTE — Progress Notes (Signed)
 [facility]  [address] [place],  [place]  [address] [phone]  Clinic Day:  03/23/2024  Referring physician: Mercy Stall, MD  HISTORY OF PRESENT ILLNESS:  The patient is an 83 y.o. 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  He has been on complete androgen deprivation therapy for months, which consists of Lupron/darolutamide.  As it pertains to his prostate cancer management, he denies having any side effects from his complete androgen blockade therapy.  He takes darolutamide just 3 times per week.  He denies having any new symptoms or findings which concern him for progression of his metastatic prostate cancer.    PHYSICAL EXAM:  Blood pressure 133/84, pulse 61, temperature 98.2 F (36.8 C), temperature source Oral, resp. rate 18, height 5\' 10"  (1.778 m), weight 190 lb 8 oz (86.4 kg), SpO2 93%. Wt Readings from Last 3 Encounters:  03/23/24 190 lb 8 oz (86.4 kg)  02/01/24 194 lb (88 kg)  12/24/23 194 lb 11.2 oz (88.3 kg)   Body mass index is 27.33 kg/m. Performance status (ECOG): 1 Physical Exam Constitutional:      General: He is not in acute distress.    Appearance: Normal appearance. He is normal weight.  HENT:     Head: Normocephalic and atraumatic.  Eyes:     General: No scleral icterus.    Extraocular Movements: Extraocular movements intact.     Conjunctiva/sclera: Conjunctivae normal.     Pupils: Pupils are equal, round, and reactive to light.  Cardiovascular:     Rate and Rhythm: Normal rate and regular rhythm.     Pulses: Normal pulses.     Heart sounds: Normal heart sounds. No murmur heard.    No friction rub. No gallop.  Pulmonary:     Effort: Pulmonary effort is normal. No respiratory distress.     Breath sounds: Normal breath sounds.  Abdominal:     General: Bowel sounds are normal. There is no distension.     Palpations: Abdomen is soft.  There is no hepatomegaly, splenomegaly or mass.     Tenderness: There is no abdominal tenderness.  Musculoskeletal:        General: Normal range of motion.     Cervical back: Normal range of motion and neck supple.     Right lower leg: No edema.     Left lower leg: No edema.  Lymphadenopathy:     Cervical: No cervical adenopathy.  Skin:    General: Skin is warm and dry.  Neurological:     General: No focal deficit present.     Mental Status: He is alert and oriented to person, place, and time. Mental status is at baseline.  Psychiatric:        Mood and Affect: Mood normal.        Behavior: Behavior normal.        Thought Content: Thought content normal.        Judgment: Judgment normal.    LABS:    Latest Reference Range & Units 12/23/23 11:31 03/22/24 11:06  Prostatic Specific Antigen 0.00 - 4.00 ng/mL 0.05 0.06   ASSESSMENT & PLAN:  Assessment/Plan:  An 84 y.o. male with metastatic prostate cancer.  His PSA level of .06 remains consistent with undetectable disease, which is when the level is < 0.1.  Based upon this, he will continue to take darolutamide 300 mg 3 times per week.  He will continue Lupron shots every 3 months, including his next shot today.  I will see him back in 3 months for repeat clinical assessment. The patient understands all the plans discussed today and is in agreement with them.    Vernie Piet Felicia Horde, MD

## 2024-03-26 ENCOUNTER — Encounter: Payer: Self-pay | Admitting: Oncology

## 2024-03-27 ENCOUNTER — Other Ambulatory Visit: Payer: Self-pay | Admitting: Family Medicine

## 2024-04-03 DIAGNOSIS — G309 Alzheimer's disease, unspecified: Secondary | ICD-10-CM | POA: Diagnosis not present

## 2024-04-03 DIAGNOSIS — F0280 Dementia in other diseases classified elsewhere without behavioral disturbance: Secondary | ICD-10-CM | POA: Diagnosis not present

## 2024-04-03 DIAGNOSIS — I679 Cerebrovascular disease, unspecified: Secondary | ICD-10-CM | POA: Diagnosis not present

## 2024-04-24 ENCOUNTER — Other Ambulatory Visit: Payer: Self-pay | Admitting: Family Medicine

## 2024-04-24 DIAGNOSIS — I110 Hypertensive heart disease with heart failure: Secondary | ICD-10-CM

## 2024-05-04 NOTE — Progress Notes (Addendum)
 Subjective:  Patient ID: Marcus King, male    DOB: October 17, 1941  Age: 83 y.o. MRN: [id number]  Chief Complaint  Patient presents with   Medical Management of Chronic Issues    Discussed the use of AI scribe software for clinical note transcription with the patient, who gave verbal consent to proceed.  History of Present Illness   Marcus Frate "King" is an 83 year old male with hypertension who presents with blood pressure management issues. He is accompanied by his wife.  He experiences significant fluctuations in blood pressure, with readings as high as 159 mmHg in the morning and dropping to 100/58 mmHg after taking his medication. These fluctuations occur daily and are described as a constant battle.  He takes carvedilol  25 mg, usually a half dose in the morning and a full dose at night. The medication causes his blood pressure to drop too low, leading to dizziness and falls. He has experienced two falls recently. Attempts to eliminate the morning dose result in blood pressure rising to 161-170 mmHg by dinner time.  He is also on lisinopril  and another medication he refers to as 'acetaminophen  or whatever it's called,' but he does not feel these affect him significantly. He has not tried other beta blockers.  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 He is scheduled to see a blood pressure specialist in July, after waiting for several months.  During the review of symptoms, he denies any symptoms associated with his blood pressure fluctuations, except for the dizziness and falls. He also mentions that his heart 'skips a beat sometimes' when on the medication. Denies chest pain or shortness of breath.     Current medications: Hypertension: Taking Amlodipine  10 mg daily, Carvedilol  25 mg twice a day, lisinopril  20 mg twice daily, inspra  25 mg daily.     Hyperlipidemia: He takes Pravastatin  20 mg daily.     GAD:  Buspar  30 mg twice a day, Paroxetine  40 mg daily.   Atopic Dermatitis: Taking Dupixent inject q4 weeks, Hydroxyzine  25 mg  one before bed.  Skin is doing well. Itching is well managed.    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 Lupron  Depot 3 month IM, NubeQa  300 mg 3 times per week. Managed by oncology.    Pulmonary embolus BL and leg dvt occurred in 04/2020. After covid. He is on aspirin  81 mg daily.   Alzheimer's dementia: He is tolerating namenda  10 mg daily, rivastigmine 1.5 mg twice a day.      02/29/2024    1:22 PM 07/09/2023   10:19 AM 02/09/2023   10:20 AM 07/21/2022    3:04 PM 02/04/2022   10:38 AM  Depression screen PHQ 2/9  Decreased Interest 0 0 0 0 0  Down, Depressed, Hopeless 0 0 0 0 0  PHQ - 2 Score 0 0 0 0 0  Altered sleeping  0     Tired, decreased energy  3     Change in appetite  0     Feeling bad or failure about yourself   0     Trouble concentrating  0     Moving slowly or fidgety/restless  3     Suicidal thoughts  0     PHQ-9 Score  6     Difficult doing work/chores  Not difficult at all           02/29/2024  1:20 PM  Fall Risk   Falls in the past year? 0  Number falls in past yr: 0  Injury with Fall? 0  Risk for fall due to : No Fall Risks  Follow up Falls evaluation completed    Patient Care Team: Mercy Stall, MD as PCP - General (Family Medicine) Deloria Fetch, MD as PCP - Hematology/Oncology (Oncology) Allie Ivanoff, MD as Referring Physician (Dermatology) Katie Parks, MD as Consulting Physician (Nephrology) Mahlon Schwab., MD as Referring Physician (Cardiology) Devon Fogo, [facility] (Inactive) (Pharmacist)   Review of Systems  Constitutional:  Positive for activity change. Negative for appetite change, fatigue and fever.  HENT:  Negative for congestion, ear pain, sinus pressure and sore throat.   Eyes: Negative.   Respiratory:  Negative for cough, chest tightness, shortness of breath and wheezing.   Cardiovascular:   Negative for chest pain and palpitations.  Gastrointestinal:  Negative for abdominal pain, constipation, diarrhea, nausea and vomiting.  Endocrine: Negative.   Genitourinary:  Negative for dysuria and hematuria.  Musculoskeletal:  Negative for arthralgias, back pain, joint swelling and myalgias.  Skin:  Negative for rash.  Allergic/Immunologic: Negative.   Neurological:  Negative for dizziness, weakness and headaches.  Hematological: Negative.   Psychiatric/Behavioral:  Negative for dysphoric mood. The patient is not nervous/anxious.     Current Outpatient Medications on File Prior to Visit  Medication Sig Dispense Refill   amLODipine  (NORVASC ) 10 MG tablet Take 1 tablet (10 mg total) by mouth 2 (two) times daily. 120 tablet 2   aspirin  EC 81 MG tablet Take 81 mg by mouth daily. Swallow whole.     busPIRone  (BUSPAR ) 30 MG tablet Take 1 tablet (30 mg total) by mouth in the morning and at bedtime. 60 tablet 2   carvedilol  (COREG ) 25 MG tablet take 1 tablet (25 mg) by oral route 2 times per day with food 180 tablet 0   cetirizine (ZYRTEC) 10 MG tablet Take 10 mg by mouth.     darolutamide  (NUBEQA ) 300 MG tablet Take 1 tablet (300 mg total) by mouth daily. Take on MONDAY, WEDNESDAY, AND FRIDAY ONLY 48 tablet 3   doxazosin (CARDURA) 1 MG tablet Take 1 mg by mouth at bedtime.     Dupilumab 100 MG/0.67ML SOSY Inject into the skin.     eplerenone  (INSPRA ) 25 MG tablet Take 1 tablet (25 mg total) by mouth daily. 90 tablet 1   hydrOXYzine  (ATARAX ) 25 MG tablet Take 1 tablet (25 mg total) by mouth every 8 (eight) hours as needed. 270 tablet 0   Leuprolide  Acetate, 3 Month, (LUPRON  DEPOT, 99-MONTH, IM) Inject into the muscle.     lisinopril  (ZESTRIL ) 20 MG tablet Take 1 tablet (20 mg total) by mouth 2 (two) times daily. 180 tablet 0   memantine  (NAMENDA ) 10 MG tablet Take 1 tablet (10 mg total) by mouth 2 (two) times daily. 60 tablet 3   pravastatin  (PRAVACHOL ) 20 MG tablet Take 1 tablet (20 mg total)  by mouth daily. 90 tablet 1   rivastigmine (EXELON) 1.5 MG capsule Take 1.5 mg by mouth.     Vitamin D , Ergocalciferol , (DRISDOL ) 1.25 MG (50000 UNIT) CAPS capsule Take 1 capsule (50,000 Units total) by mouth 2 (two) times a week. 24 capsule 1   No current facility-administered medications on file prior to visit.   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   Congestive heart failure with LV diastolic dysfunction, NYHA class 1 (HCC) 09/21/2018   Dyslipidemia 08/11/2017   Dyspnea on exertion 04/23/2020   Edema extremities 04/14/2018   Elevated glucose 10/17/2019   Fatigue 04/23/2020   Food poisoning    GAD (generalized anxiety disorder)    H/O deep venous thrombosis 04/14/2021   Headache    sinus   History of pulmonary embolism 06/13/2020   History of pulmonary embolus (PE) 06/13/2020   Hypertension    Hypertensive heart disease 08/05/2017   Hypoxia 10/17/2019   Inguinal lymphadenopathy 05/26/2020   LVH (left ventricular hypertrophy) due to hypertensive disease 08/11/2017   Malignant neoplasm of prostate (HCC) 09/09/2020   Mixed hyperlipidemia    Orthostatic hypotension    Orthostatic syncope 10/17/2019   OSA on CPAP    Prostate cancer (HCC)    Pulmonary embolism (HCC) 04/2020   PVC's (premature ventricular contractions) 05/18/2018   Rectal mass 05/24/2020   S/P TKR (total knee replacement) using cement, 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   Solid nodule of lung greater than 8 mm in diameter 05/26/2020   Past Surgical History:  Procedure Laterality Date   CARDIAC CATHETERIZATION  07/17/2022   CATARACT EXTRACTION W/ INTRAOCULAR LENS  IMPLANT, BILATERAL Bilateral    COLON SURGERY  2014   COLOSTOMY REVERSAL  2014   HERNIA REPAIR     x2   JOINT REPLACEMENT     PROSTATE SURGERY  2006   SEPTOPLASTY     TOTAL KNEE ARTHROPLASTY Left 09/28/2016   Procedure: TOTAL  KNEE ARTHROPLASTY;  Surgeon: Christie Cox, MD;  Location: [facility] OR;  Service: Orthopedics;  Laterality: Left;    Family History  Problem Relation Age of Onset   Stroke Mother    Parkinson's disease Mother    Lung disease Father    Stroke Father    Alcoholism Father    Leukemia Sister    Muscular dystrophy Son        Becker's   Alzheimer's disease Neg Hx    Social History   Socioeconomic History   Marital status: Married    Spouse name: Orelia Binet   Number of children: 2   Years of education: Not on file   Highest education level: High school graduate  Occupational History   Occupation: Merchant navy officer    Comment: ICDs/Wound Care  Tobacco Use   Smoking status: Former    Current packs/day: 0.00    Types: Cigarettes    Start date: 1959    Quit date: 1979    Years since quitting: 46.4   Smokeless tobacco: Never  Vaping Use   Vaping status: Never Used  Substance and Sexual Activity   Alcohol use: No   Drug use: Never   Sexual activity: Not Currently  Other Topics Concern   Not on file  Social History Narrative   LIves at home with his wife   Daughter lives in [facility], Son lives in [place]   Right handed   Drinks about 2 cups of caffeine daily   Social Drivers of Health   Financial Resource Strain: Low Risk  (03/18/2023)   Overall Financial Resource Strain (CARDIA)    Difficulty of Paying Living Expenses: Not hard at all  Food Insecurity: No Food Insecurity (03/18/2023)   Hunger Vital Sign    Worried About Running Out of Food in the Last Year: Never true    Ran Out of Food in the Last Year: Never  true  Transportation Needs: No Transportation Needs (10/14/2023)   PRAPARE - Administrator, Civil Service (Medical): No    Lack of Transportation (Non-Medical): No  Physical Activity: Unknown (03/18/2023)   Exercise Vital Sign    Days of Exercise per Week: 0 days    Minutes of Exercise per Session: Not on file  Recent Concern: Physical Activity - Inactive  (03/18/2023)   Exercise Vital Sign    Days of Exercise per Week: 0 days    Minutes of Exercise per Session: 0 min  Stress: No Stress Concern Present (03/18/2023)   [employer] of Occupational Health - Occupational Stress Questionnaire    Feeling of Stress : Not at all  Social Connections: Moderately Integrated (03/18/2023)   Social Connection and Isolation Panel [NHANES]    Frequency of Communication with Friends and Family: More than three times a week    Frequency of Social Gatherings with Friends and Family: Once a week    Attends Religious Services: More than 4 times per year    Active Member of [employer] or Organizations: No    Attends Engineer, structural: Not on file    Marital Status: Married    Objective:  BP (!) 100/58   Pulse 75   Temp (!) 97.3 F (36.3 C)   Resp 14   Ht 5\' 10"  (1.778 m)   Wt 191 lb (86.6 kg)   SpO2 95%   BMI 27.41 kg/m      05/05/2024   10:22 AM 03/23/2024   12:07 PM 02/29/2024    1:10 PM  BP/Weight  Systolic BP 100 133 147  Diastolic BP 58 84 70  Wt. (Lbs) 191 190.5   BMI 27.41 kg/m2 27.33 kg/m2     Physical Exam Vitals reviewed.  Constitutional:      General: He is not in acute distress.    Appearance: Normal appearance.  Eyes:     Conjunctiva/sclera: Conjunctivae normal.  Cardiovascular:     Rate and Rhythm: Normal rate and regular rhythm.     Heart sounds: Normal heart sounds. No murmur heard. Pulmonary:     Effort: Pulmonary effort is normal.     Breath sounds: Normal breath sounds. No wheezing.  Abdominal:     General: Bowel sounds are normal.     Palpations: Abdomen is soft.     Tenderness: There is no abdominal tenderness.  Musculoskeletal:        General: Normal range of motion.  Skin:    General: Skin is warm.  Neurological:     Mental Status: He is alert. Mental status is at baseline.  Psychiatric:        Mood and Affect: Mood normal.        Behavior: Behavior normal.     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 BP Readings from Last 3 Encounters:  05/05/24 (!) 100/58  03/23/24 133/84  02/29/24 (!) 147/70    - Reduce morning carvedilol  to 6.25 mg by quartering a 25 mg tablet due to hypotension and recent history of falls and unsteadiness.  - If lunchtime BP reading  >150-160 mmHg, take an additional quarter of carvedilol . - Continue 25 mg carvedilol  at night. - Continue lisinopril , amlodipine , and inspra  as directed.  - Follow-up in two weeks to assess dosage adjustment. - Await specialist appointment in July with Dr. Marcella Serge.  Orders: -     CBC with Differential/Platelet -     CMP14+EGFR  Mixed hyperlipidemia Assessment & Plan: Well controlled Continue Pravastatin  20 mg daily.   Recommend continue to work on eating healthy diet and exercise. Check lipid panel.   Orders: -     Lipid panel  GAD (generalized anxiety disorder) Assessment & Plan: Well controlled    02/29/2024    1:22 PM 07/09/2023   10:19 AM 02/09/2023   10:20 AM  PHQ9 SCORE ONLY  PHQ-9 Total Score 0 6 0   Continue Buspar  30 mg twice daily. Paroxetine  40 mg daily.   OSA treated with BiPAP Assessment & Plan: Continue CPAP.  Compliant and benefiting.   Vitamin D  deficiency Assessment & Plan: Last Vitamin D  level - 55.5 - labs drawn, Await labs/testing for  assessment and recommendations   Orders: -     VITAMIN D  25 Hydroxy (Vit-D Deficiency, Fractures)  Malignant neoplasm of prostate [facility]) Assessment & Plan: The current medical regimen is effective;  continue present plan and medications. Management per specialist.     Alzheimer's disease [facility]) Assessment & Plan: Current medication regimen is effective - Continue Namenda  10 mg daily and rivastigmine 1.5 mg twice daily.        No orders of the defined types were placed in this encounter.   Orders Placed This Encounter  Procedures   CBC with Differential/Platelet   CMP14+EGFR   Lipid panel   VITAMIN D  25 Hydroxy (Vit-D Deficiency, Fractures)     Follow-up: Return in about 2 weeks (around 05/19/2024) for BP recheck.    An After Visit Summary was printed and given to the patient.  Delford Felling, FNP [facility] [phone]

## 2024-05-05 ENCOUNTER — Encounter: Payer: Self-pay | Admitting: Family Medicine

## 2024-05-05 ENCOUNTER — Ambulatory Visit: Payer: Medicare Other | Admitting: Family Medicine

## 2024-05-05 VITALS — BP 100/58 | HR 75 | Temp 97.3°F | Resp 14 | Ht 70.0 in | Wt 191.0 lb

## 2024-05-05 DIAGNOSIS — C61 Malignant neoplasm of prostate: Secondary | ICD-10-CM | POA: Diagnosis not present

## 2024-05-05 DIAGNOSIS — I110 Hypertensive heart disease with heart failure: Secondary | ICD-10-CM

## 2024-05-05 DIAGNOSIS — E782 Mixed hyperlipidemia: Secondary | ICD-10-CM | POA: Diagnosis not present

## 2024-05-05 DIAGNOSIS — G309 Alzheimer's disease, unspecified: Secondary | ICD-10-CM

## 2024-05-05 DIAGNOSIS — F0280 Dementia in other diseases classified elsewhere without behavioral disturbance: Secondary | ICD-10-CM

## 2024-05-05 DIAGNOSIS — E559 Vitamin D deficiency, unspecified: Secondary | ICD-10-CM

## 2024-05-05 DIAGNOSIS — F411 Generalized anxiety disorder: Secondary | ICD-10-CM | POA: Diagnosis not present

## 2024-05-05 DIAGNOSIS — F02B3 Dementia in other diseases classified elsewhere, moderate, with mood disturbance: Secondary | ICD-10-CM

## 2024-05-05 DIAGNOSIS — I5089 Other heart failure: Secondary | ICD-10-CM | POA: Diagnosis not present

## 2024-05-05 DIAGNOSIS — G4733 Obstructive sleep apnea (adult) (pediatric): Secondary | ICD-10-CM | POA: Diagnosis not present

## 2024-05-06 ENCOUNTER — Ambulatory Visit: Payer: Self-pay | Admitting: Family Medicine

## 2024-05-06 LAB — CBC WITH DIFFERENTIAL/PLATELET
Basophils Absolute: 0 10*3/uL (ref 0.0–0.2)
Basos: 0 %
EOS (ABSOLUTE): 0.4 10*3/uL (ref 0.0–0.4)
Eos: 6 %
Hematocrit: 42.4 % (ref 37.5–51.0)
Hemoglobin: 13.9 g/dL (ref 13.0–17.7)
Immature Grans (Abs): 0 10*3/uL (ref 0.0–0.1)
Immature Granulocytes: 0 %
Lymphocytes Absolute: 1.8 10*3/uL (ref 0.7–3.1)
Lymphs: 23 %
MCH: 31.1 pg (ref 26.6–33.0)
MCHC: 32.8 g/dL (ref 31.5–35.7)
MCV: 95 fL (ref 79–97)
Monocytes Absolute: 0.7 10*3/uL (ref 0.1–0.9)
Monocytes: 9 %
Neutrophils Absolute: 5 10*3/uL (ref 1.4–7.0)
Neutrophils: 62 %
Platelets: 268 10*3/uL (ref 150–450)
RBC: 4.47 x10E6/uL (ref 4.14–5.80)
RDW: 13.2 % (ref 11.6–15.4)
WBC: 7.9 10*3/uL (ref 3.4–10.8)

## 2024-05-06 LAB — CMP14+EGFR
ALT: 14 IU/L (ref 0–44)
AST: 19 IU/L (ref 0–40)
Albumin: 4.2 g/dL (ref 3.7–4.7)
Alkaline Phosphatase: 99 IU/L (ref 44–121)
BUN/Creatinine Ratio: 17 (ref 10–24)
BUN: 19 mg/dL (ref 8–27)
Bilirubin Total: 0.3 mg/dL (ref 0.0–1.2)
CO2: 20 mmol/L (ref 20–29)
Calcium: 9.2 mg/dL (ref 8.6–10.2)
Chloride: 104 mmol/L (ref 96–106)
Creatinine, Ser: 1.12 mg/dL (ref 0.76–1.27)
Globulin, Total: 2.4 g/dL (ref 1.5–4.5)
Glucose: 84 mg/dL (ref 70–99)
Potassium: 4.4 mmol/L (ref 3.5–5.2)
Sodium: 140 mmol/L (ref 134–144)
Total Protein: 6.6 g/dL (ref 6.0–8.5)
eGFR: 65 mL/min/{1.73_m2} (ref >59)

## 2024-05-06 LAB — VITAMIN D 25 HYDROXY (VIT D DEFICIENCY, FRACTURES): Vit D, 25-Hydroxy: 74.9 ng/mL (ref 30.0–100.0)

## 2024-05-06 LAB — LIPID PANEL
Chol/HDL Ratio: 2.7 ratio (ref 0.0–5.0)
Cholesterol, Total: 131 mg/dL (ref 100–199)
HDL: 49 mg/dL (ref >39)
LDL Chol Calc (NIH): 63 mg/dL (ref 0–99)
Triglycerides: 106 mg/dL (ref 0–149)
VLDL Cholesterol Cal: 19 mg/dL (ref 5–40)

## 2024-05-06 NOTE — Assessment & Plan Note (Addendum)
 Fluctuating blood pressure with significant drops post-carvedilol , causing hypotension and dizziness. BP Readings from Last 3 Encounters:  05/05/24 (!) 100/58  03/23/24 133/84  02/29/24 (!) 147/70    - Reduce morning carvedilol  to 6.25 mg by quartering a 25 mg tablet due to hypotension and recent history of falls and unsteadiness.  - If lunchtime BP reading  >150-160 mmHg, take an additional quarter of carvedilol . - Continue 25 mg carvedilol  at night. - Continue lisinopril , amlodipine , and inspra  as directed.  - Follow-up in two weeks to assess dosage adjustment. - Await specialist appointment in July with Dr. Marcella Serge.

## 2024-05-06 NOTE — Assessment & Plan Note (Signed)
 Well controlled    02/29/2024    1:22 PM 07/09/2023   10:19 AM 02/09/2023   10:20 AM  PHQ9 SCORE ONLY  PHQ-9 Total Score 0 6 0   Continue Buspar  30 mg twice daily. Paroxetine  40 mg daily.

## 2024-05-06 NOTE — Assessment & Plan Note (Signed)
 Well controlled Continue Pravastatin  20 mg daily.   Recommend continue to work on eating healthy diet and exercise. Check lipid panel.

## 2024-05-06 NOTE — Assessment & Plan Note (Addendum)
 Last Vitamin D  level - 55.5 - labs drawn, Await labs/testing for assessment and recommendations

## 2024-05-06 NOTE — Assessment & Plan Note (Signed)
The current medical regimen is effective;  continue present plan and medications.

## 2024-05-15 ENCOUNTER — Other Ambulatory Visit: Payer: Self-pay

## 2024-05-15 MED ORDER — PAROXETINE HCL 40 MG PO TABS: 40.0000 mg | ORAL_TABLET | Freq: Every day | ORAL | 1 refills | Status: DC

## 2024-05-16 DIAGNOSIS — F0280 Dementia in other diseases classified elsewhere without behavioral disturbance: Secondary | ICD-10-CM | POA: Insufficient documentation

## 2024-05-16 NOTE — Assessment & Plan Note (Signed)
 Current medication regimen is effective - Continue Namenda  10 mg daily and rivastigmine 1.5 mg twice daily.

## 2024-05-16 NOTE — Assessment & Plan Note (Signed)
The current medical regimen is effective;  continue present plan and medications. Management per specialist.   

## 2024-05-18 ENCOUNTER — Ambulatory Visit

## 2024-06-15 ENCOUNTER — Other Ambulatory Visit: Payer: Self-pay | Admitting: Family Medicine

## 2024-06-15 DIAGNOSIS — G301 Alzheimer's disease with late onset: Secondary | ICD-10-CM

## 2024-06-20 ENCOUNTER — Other Ambulatory Visit: Payer: Self-pay

## 2024-06-21 ENCOUNTER — Inpatient Hospital Stay: Attending: Oncology

## 2024-06-21 DIAGNOSIS — Z86718 Personal history of other venous thrombosis and embolism: Secondary | ICD-10-CM | POA: Diagnosis not present

## 2024-06-21 DIAGNOSIS — I10 Essential (primary) hypertension: Secondary | ICD-10-CM | POA: Diagnosis not present

## 2024-06-21 DIAGNOSIS — H43393 Other vitreous opacities, bilateral: Secondary | ICD-10-CM | POA: Diagnosis not present

## 2024-06-21 DIAGNOSIS — C774 Secondary and unspecified malignant neoplasm of inguinal and lower limb lymph nodes: Secondary | ICD-10-CM | POA: Diagnosis not present

## 2024-06-21 DIAGNOSIS — R5383 Other fatigue: Secondary | ICD-10-CM | POA: Diagnosis not present

## 2024-06-21 DIAGNOSIS — I1A0 Resistant hypertension: Secondary | ICD-10-CM | POA: Diagnosis not present

## 2024-06-21 DIAGNOSIS — C61 Malignant neoplasm of prostate: Secondary | ICD-10-CM | POA: Insufficient documentation

## 2024-06-21 DIAGNOSIS — Z5111 Encounter for antineoplastic chemotherapy: Secondary | ICD-10-CM | POA: Insufficient documentation

## 2024-06-21 DIAGNOSIS — R5381 Other malaise: Secondary | ICD-10-CM | POA: Diagnosis not present

## 2024-06-21 DIAGNOSIS — Z79899 Other long term (current) drug therapy: Secondary | ICD-10-CM | POA: Diagnosis not present

## 2024-06-21 DIAGNOSIS — I2510 Atherosclerotic heart disease of native coronary artery without angina pectoris: Secondary | ICD-10-CM | POA: Diagnosis not present

## 2024-06-21 DIAGNOSIS — F0390 Unspecified dementia without behavioral disturbance: Secondary | ICD-10-CM | POA: Diagnosis not present

## 2024-06-21 LAB — PSA: Prostatic Specific Antigen: 0.05 ng/mL (ref 0.00–4.00)

## 2024-06-21 MED ORDER — LISINOPRIL 20 MG PO TABS: 20.0000 mg | ORAL_TABLET | Freq: Two times a day (BID) | ORAL | 0 refills | Status: DC

## 2024-06-21 NOTE — Progress Notes (Signed)
 [facility]  [address] [place],  [place]  [address] [phone]  Clinic Day:  06/22/2024  Referring physician: Sherre Clapper, MD  HISTORY OF PRESENT ILLNESS:  The patient is an 83 y.o. 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  He has been on complete androgen deprivation therapy for months, which consists of Lupron /darolutamide .  As it pertains to his prostate cancer management, he denies having any side effects from his complete androgen blockade therapy.  He chronically takes darolutamide  just 3 times per week.  He denies having any new symptoms or findings which concern him for progression of his metastatic prostate cancer.    PHYSICAL EXAM:  Blood pressure 135/78, pulse 69, temperature 98.8 F (37.1 C), temperature source Oral, resp. rate 16, height 5' 10 (1.778 m), weight 187 lb 8 oz (85 kg), SpO2 95%. Wt Readings from Last 3 Encounters:  06/22/24 187 lb 8 oz (85 kg)  05/05/24 191 lb (86.6 kg)  03/23/24 190 lb 8 oz (86.4 kg)   Body mass index is 26.9 kg/m. Performance status (ECOG): 1 Physical Exam Constitutional:      General: He is not in acute distress.    Appearance: Normal appearance. He is normal weight.  HENT:     Head: Normocephalic and atraumatic.  Eyes:     General: No scleral icterus.    Extraocular Movements: Extraocular movements intact.     Conjunctiva/sclera: Conjunctivae normal.     Pupils: Pupils are equal, round, and reactive to light.  Cardiovascular:     Rate and Rhythm: Normal rate and regular rhythm.     Pulses: Normal pulses.     Heart sounds: Normal heart sounds. No murmur heard.    No friction rub. No gallop.  Pulmonary:     Effort: Pulmonary effort is normal. No respiratory distress.     Breath sounds: Normal breath sounds.  Abdominal:     General: Bowel sounds are normal. There is no distension.     Palpations: Abdomen is  soft. There is no hepatomegaly, splenomegaly or mass.     Tenderness: There is no abdominal tenderness.  Musculoskeletal:        General: Normal range of motion.     Cervical back: Normal range of motion and neck supple.     Right lower leg: No edema.     Left lower leg: No edema.  Lymphadenopathy:     Cervical: No cervical adenopathy.  Skin:    General: Skin is warm and dry.  Neurological:     General: No focal deficit present.     Mental Status: He is alert and oriented to person, place, and time. Mental status is at baseline.  Psychiatric:        Mood and Affect: Mood normal.        Behavior: Behavior normal.        Thought Content: Thought content normal.        Judgment: Judgment normal.    LABS:    Latest Reference Range & Units 12/23/23 11:31 03/22/24 11:06 06/21/24 11:23  Prostatic Specific Antigen 0.00 - 4.00 ng/mL 0.05 0.06 0.05   ASSESSMENT & PLAN:  Assessment/Plan:  An 83 y.o. male with metastatic prostate cancer.  His PSA level of .05 remains consistent with undetectable disease, which is when the level is < 0.1.  Based upon this, he will continue to take darolutamide  300 mg 3  times per week.  He will continue Lupron  shots every 3 months, including his next shot today.  I will see him back in 3 months for repeat clinical assessment.  The patient understands all the plans discussed today and is in agreement with them.    Shariece Viveiros DELENA Kerns, MD

## 2024-06-22 ENCOUNTER — Other Ambulatory Visit: Payer: Self-pay

## 2024-06-22 ENCOUNTER — Telehealth: Payer: Self-pay | Admitting: Oncology

## 2024-06-22 ENCOUNTER — Inpatient Hospital Stay

## 2024-06-22 ENCOUNTER — Other Ambulatory Visit: Payer: Self-pay | Admitting: Oncology

## 2024-06-22 ENCOUNTER — Inpatient Hospital Stay (HOSPITAL_BASED_OUTPATIENT_CLINIC_OR_DEPARTMENT_OTHER): Admitting: Oncology

## 2024-06-22 VITALS — BP 135/78 | HR 69 | Temp 98.8°F | Resp 16 | Ht 70.0 in | Wt 187.5 lb

## 2024-06-22 DIAGNOSIS — C61 Malignant neoplasm of prostate: Secondary | ICD-10-CM | POA: Diagnosis not present

## 2024-06-22 DIAGNOSIS — C7989 Secondary malignant neoplasm of other specified sites: Secondary | ICD-10-CM | POA: Diagnosis not present

## 2024-06-22 DIAGNOSIS — Z79899 Other long term (current) drug therapy: Secondary | ICD-10-CM | POA: Diagnosis not present

## 2024-06-22 DIAGNOSIS — C774 Secondary and unspecified malignant neoplasm of inguinal and lower limb lymph nodes: Secondary | ICD-10-CM | POA: Diagnosis not present

## 2024-06-22 DIAGNOSIS — Z5111 Encounter for antineoplastic chemotherapy: Secondary | ICD-10-CM | POA: Diagnosis not present

## 2024-06-22 MED ORDER — LEUPROLIDE ACETATE (3 MONTH) 22.5 MG IM KIT
22.5000 mg | PACK | Freq: Once | INTRAMUSCULAR | Status: AC
Start: 2024-06-22 — End: 2024-06-22
  Administered 2024-06-22: 22.5 mg via INTRAMUSCULAR
  Filled 2024-06-22: qty 22.5

## 2024-06-22 NOTE — Telephone Encounter (Signed)
 Patient has been scheduled for follow-up visit per 06/21/24 LOS.  Pt aware of scheduled appt details.

## 2024-06-23 ENCOUNTER — Encounter: Payer: Self-pay | Admitting: Oncology

## 2024-06-25 ENCOUNTER — Other Ambulatory Visit: Payer: Self-pay | Admitting: Family Medicine

## 2024-07-31 ENCOUNTER — Other Ambulatory Visit: Payer: Self-pay

## 2024-07-31 DIAGNOSIS — F411 Generalized anxiety disorder: Secondary | ICD-10-CM

## 2024-07-31 MED ORDER — BUSPIRONE HCL 30 MG PO TABS: 30.0000 mg | ORAL_TABLET | Freq: Two times a day (BID) | ORAL | 2 refills | Status: DC

## 2024-08-19 ENCOUNTER — Other Ambulatory Visit: Payer: Self-pay | Admitting: Family Medicine

## 2024-08-19 DIAGNOSIS — I110 Hypertensive heart disease with heart failure: Secondary | ICD-10-CM

## 2024-08-25 DIAGNOSIS — L578 Other skin changes due to chronic exposure to nonionizing radiation: Secondary | ICD-10-CM | POA: Diagnosis not present

## 2024-08-25 DIAGNOSIS — L209 Atopic dermatitis, unspecified: Secondary | ICD-10-CM | POA: Diagnosis not present

## 2024-08-25 DIAGNOSIS — C44311 Basal cell carcinoma of skin of nose: Secondary | ICD-10-CM | POA: Diagnosis not present

## 2024-08-25 DIAGNOSIS — L814 Other melanin hyperpigmentation: Secondary | ICD-10-CM | POA: Diagnosis not present

## 2024-08-25 DIAGNOSIS — L570 Actinic keratosis: Secondary | ICD-10-CM | POA: Diagnosis not present

## 2024-09-04 ENCOUNTER — Other Ambulatory Visit: Payer: Self-pay | Admitting: Family Medicine

## 2024-09-04 ENCOUNTER — Other Ambulatory Visit: Payer: Self-pay

## 2024-09-04 MED ORDER — PRAVASTATIN SODIUM 20 MG PO TABS: 20.0000 mg | ORAL_TABLET | Freq: Every day | ORAL | 1 refills | Status: AC

## 2024-09-13 ENCOUNTER — Other Ambulatory Visit: Payer: Self-pay | Admitting: Oncology

## 2024-09-13 MED ORDER — DAROLUTAMIDE 300 MG PO TABS: 300.0000 mg | ORAL_TABLET | Freq: Every day | ORAL | 3 refills | Status: DC

## 2024-09-21 ENCOUNTER — Inpatient Hospital Stay: Attending: Oncology

## 2024-09-21 DIAGNOSIS — C61 Malignant neoplasm of prostate: Secondary | ICD-10-CM | POA: Insufficient documentation

## 2024-09-21 DIAGNOSIS — Z79899 Other long term (current) drug therapy: Secondary | ICD-10-CM | POA: Insufficient documentation

## 2024-09-21 DIAGNOSIS — C774 Secondary and unspecified malignant neoplasm of inguinal and lower limb lymph nodes: Secondary | ICD-10-CM | POA: Insufficient documentation

## 2024-09-21 DIAGNOSIS — Z5111 Encounter for antineoplastic chemotherapy: Secondary | ICD-10-CM | POA: Insufficient documentation

## 2024-09-21 LAB — PSA: Prostatic Specific Antigen: 0.03 ng/mL (ref 0.00–4.00)

## 2024-09-22 ENCOUNTER — Ambulatory Visit: Admitting: Oncology

## 2024-09-22 ENCOUNTER — Ambulatory Visit

## 2024-09-24 NOTE — Progress Notes (Unsigned)
 [facility]  [address] [place],  [place]  [address] [phone]  Clinic Day:  06/22/2024  Referring physician: Sherre Clapper, MD  HISTORY OF PRESENT ILLNESS:  The patient is an 83 y.o. 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  He has been on complete androgen deprivation therapy for months, which consists of Lupron /darolutamide .  As it pertains to his prostate cancer management, he denies having any side effects from his complete androgen blockade therapy.  He chronically takes darolutamide  just 3 times per week.  He denies having any new symptoms or findings which concern him for progression of his metastatic prostate cancer.    PHYSICAL EXAM:  There were no vitals taken for this visit. Wt Readings from Last 3 Encounters:  06/22/24 187 lb 8 oz (85 kg)  05/05/24 191 lb (86.6 kg)  03/23/24 190 lb 8 oz (86.4 kg)   There is no height or weight on file to calculate BMI. Performance status (ECOG): 1 Physical Exam Constitutional:      General: He is not in acute distress.    Appearance: Normal appearance. He is normal weight.  HENT:     Head: Normocephalic and atraumatic.  Eyes:     General: No scleral icterus.    Extraocular Movements: Extraocular movements intact.     Conjunctiva/sclera: Conjunctivae normal.     Pupils: Pupils are equal, round, and reactive to light.  Cardiovascular:     Rate and Rhythm: Normal rate and regular rhythm.     Pulses: Normal pulses.     Heart sounds: Normal heart sounds. No murmur heard.    No friction rub. No gallop.  Pulmonary:     Effort: Pulmonary effort is normal. No respiratory distress.     Breath sounds: Normal breath sounds.  Abdominal:     General: Bowel sounds are normal. There is no distension.     Palpations: Abdomen is soft. There is no hepatomegaly, splenomegaly or mass.     Tenderness: There is no abdominal  tenderness.  Musculoskeletal:        General: Normal range of motion.     Cervical back: Normal range of motion and neck supple.     Right lower leg: No edema.     Left lower leg: No edema.  Lymphadenopathy:     Cervical: No cervical adenopathy.  Skin:    General: Skin is warm and dry.  Neurological:     General: No focal deficit present.     Mental Status: He is alert and oriented to person, place, and time. Mental status is at baseline.  Psychiatric:        Mood and Affect: Mood normal.        Behavior: Behavior normal.        Thought Content: Thought content normal.        Judgment: Judgment normal.    LABS:    Latest Reference Range & Units 03/22/24 11:06 06/21/24 11:23 09/21/24 11:18  Prostatic Specific Antigen 0.00 - 4.00 ng/mL 0.06 0.05 0.03   ASSESSMENT & PLAN:  Assessment/Plan:  An 83 y.o. male with metastatic prostate cancer.  His PSA level of .05 remains consistent with undetectable disease, which is when the level is < 0.1.  Based upon this, he will continue to take darolutamide  300 mg 3 times per week.  He will continue Lupron  shots every 3 months, including his next shot today.  I will see him back in 3 months for repeat clinical assessment.  The patient understands all the plans discussed today and is in agreement with them.    Asalee Barrette DELENA Kerns, MD

## 2024-09-25 ENCOUNTER — Inpatient Hospital Stay

## 2024-09-25 ENCOUNTER — Inpatient Hospital Stay (HOSPITAL_BASED_OUTPATIENT_CLINIC_OR_DEPARTMENT_OTHER): Admitting: Oncology

## 2024-09-25 ENCOUNTER — Other Ambulatory Visit: Payer: Self-pay | Admitting: Oncology

## 2024-09-25 ENCOUNTER — Telehealth: Payer: Self-pay | Admitting: Oncology

## 2024-09-25 VITALS — BP 140/89 | HR 67 | Temp 98.0°F | Resp 16 | Ht 70.0 in | Wt 187.0 lb

## 2024-09-25 VITALS — BP 158/71 | HR 67 | Temp 98.0°F | Resp 16

## 2024-09-25 DIAGNOSIS — Z79899 Other long term (current) drug therapy: Secondary | ICD-10-CM | POA: Diagnosis not present

## 2024-09-25 DIAGNOSIS — C61 Malignant neoplasm of prostate: Secondary | ICD-10-CM

## 2024-09-25 DIAGNOSIS — C7989 Secondary malignant neoplasm of other specified sites: Secondary | ICD-10-CM | POA: Diagnosis not present

## 2024-09-25 DIAGNOSIS — C774 Secondary and unspecified malignant neoplasm of inguinal and lower limb lymph nodes: Secondary | ICD-10-CM | POA: Diagnosis not present

## 2024-09-25 DIAGNOSIS — Z5111 Encounter for antineoplastic chemotherapy: Secondary | ICD-10-CM | POA: Diagnosis not present

## 2024-09-25 MED ORDER — LEUPROLIDE ACETATE (3 MONTH) 22.5 MG IM KIT
22.5000 mg | PACK | Freq: Once | INTRAMUSCULAR | Status: AC
  Administered 2024-09-25: 22.5 mg via INTRAMUSCULAR
  Filled 2024-09-25: qty 22.5

## 2024-09-25 NOTE — Telephone Encounter (Signed)
 Patient has been scheduled for follow-up visit per 09/25/24 LOS.  Pt given an appt calendar with date and time.

## 2024-10-05 ENCOUNTER — Other Ambulatory Visit (HOSPITAL_COMMUNITY): Payer: Self-pay

## 2024-10-05 DIAGNOSIS — Z23 Encounter for immunization: Secondary | ICD-10-CM | POA: Diagnosis not present

## 2024-10-11 DIAGNOSIS — H43393 Other vitreous opacities, bilateral: Secondary | ICD-10-CM | POA: Diagnosis not present

## 2024-10-11 DIAGNOSIS — H16211 Exposure keratoconjunctivitis, right eye: Secondary | ICD-10-CM | POA: Diagnosis not present

## 2024-10-11 DIAGNOSIS — H04123 Dry eye syndrome of bilateral lacrimal glands: Secondary | ICD-10-CM | POA: Diagnosis not present

## 2024-10-11 DIAGNOSIS — H0100B Unspecified blepharitis left eye, upper and lower eyelids: Secondary | ICD-10-CM | POA: Diagnosis not present

## 2024-10-11 DIAGNOSIS — H26493 Other secondary cataract, bilateral: Secondary | ICD-10-CM | POA: Diagnosis not present

## 2024-10-11 DIAGNOSIS — H52203 Unspecified astigmatism, bilateral: Secondary | ICD-10-CM | POA: Diagnosis not present

## 2024-10-11 DIAGNOSIS — Z961 Presence of intraocular lens: Secondary | ICD-10-CM | POA: Diagnosis not present

## 2024-10-11 DIAGNOSIS — H524 Presbyopia: Secondary | ICD-10-CM | POA: Diagnosis not present

## 2024-10-11 DIAGNOSIS — H0100A Unspecified blepharitis right eye, upper and lower eyelids: Secondary | ICD-10-CM | POA: Diagnosis not present

## 2024-10-19 ENCOUNTER — Other Ambulatory Visit: Payer: Self-pay | Admitting: Family Medicine

## 2024-10-20 ENCOUNTER — Other Ambulatory Visit: Payer: Self-pay | Admitting: Family Medicine

## 2024-10-20 DIAGNOSIS — G301 Alzheimer's disease with late onset: Secondary | ICD-10-CM

## 2024-10-23 ENCOUNTER — Other Ambulatory Visit: Payer: Self-pay | Admitting: Hematology and Oncology

## 2024-10-23 MED ORDER — DAROLUTAMIDE 300 MG PO TABS: 300.0000 mg | ORAL_TABLET | Freq: Every day | ORAL | 3 refills | Status: AC

## 2024-10-24 DIAGNOSIS — R2689 Other abnormalities of gait and mobility: Secondary | ICD-10-CM | POA: Diagnosis not present

## 2024-10-24 DIAGNOSIS — F0280 Dementia in other diseases classified elsewhere without behavioral disturbance: Secondary | ICD-10-CM | POA: Diagnosis not present

## 2024-10-24 DIAGNOSIS — G309 Alzheimer's disease, unspecified: Secondary | ICD-10-CM | POA: Diagnosis not present

## 2024-10-27 ENCOUNTER — Telehealth: Payer: Self-pay | Admitting: Family Medicine

## 2024-10-27 NOTE — Telephone Encounter (Signed)
-----   Message from Abigail Free sent at 10/19/2024 10:24 PM EST ----- Regarding: OVERDUE Patient is overdue for chronic follow up and I think he needs his ANNUAL WELLNESS VISIT.  Could you please call and schedule? Thank you

## 2024-10-27 NOTE — Telephone Encounter (Signed)
 Called and left a voicemail for patient to call the office to schedule an Annual Wellness Visit for March of 2026.

## 2024-11-03 ENCOUNTER — Other Ambulatory Visit: Payer: Self-pay

## 2024-11-05 MED ORDER — HYDROXYZINE HCL 25 MG PO TABS: 25.0000 mg | ORAL_TABLET | Freq: Three times a day (TID) | ORAL | 0 refills | Status: AC | PRN

## 2024-11-20 ENCOUNTER — Telehealth: Payer: Self-pay

## 2024-11-20 NOTE — Telephone Encounter (Signed)
 Oral Oncology Patient Advocate Encounter   Received notification that patient is due for re-enrollment for assistance for Nubeqa  through [employer].   Re-enrollment process has been initiated and will be submitted upon completion of necessary documents. Re-enrollment submitted via online portal.  [employer] phone number [phone].   I will continue to follow until final determination.  Lucie Lamer, CPhT [place]  [facility] On[facility] Pharmacy Patient Advocate Specialist [address] [place] Phone: [phone]  Fax: [phone] [email or web address]

## 2024-11-29 NOTE — Telephone Encounter (Signed)
 Oral Oncology Patient Advocate Encounter   Received notification re-enrollment for assistance for Nubeqa  through [employer] has been approved. Patient may continue to receive their medication at $0 from this program.    [employer] phone number [phone].    Effective dates: 12/14/24 through 12/13/25  I have spoken to the patient.  Marcus King, CPhT [place]  [facility] On[facility] Pharmacy Patient Advocate Specialist II Marcus King Phone: [phone]  Fax: [phone] [email or web address]

## 2024-12-08 ENCOUNTER — Other Ambulatory Visit: Payer: Self-pay | Admitting: Family Medicine

## 2024-12-13 ENCOUNTER — Other Ambulatory Visit: Payer: Self-pay | Admitting: Family Medicine

## 2024-12-25 ENCOUNTER — Inpatient Hospital Stay: Attending: Oncology

## 2024-12-25 DIAGNOSIS — C774 Secondary and unspecified malignant neoplasm of inguinal and lower limb lymph nodes: Secondary | ICD-10-CM | POA: Insufficient documentation

## 2024-12-25 DIAGNOSIS — Z79899 Other long term (current) drug therapy: Secondary | ICD-10-CM | POA: Diagnosis not present

## 2024-12-25 DIAGNOSIS — C61 Malignant neoplasm of prostate: Secondary | ICD-10-CM | POA: Insufficient documentation

## 2024-12-25 LAB — PSA: Prostatic Specific Antigen: <0.02 ng/mL (ref 0.00–4.00)

## 2024-12-25 NOTE — Progress Notes (Unsigned)
 "  [facility] [address] [place],  [place]  [address] [phone]  Clinic Day:  09/25/2024  Referring physician: Sherre Clapper, MD  HISTORY OF PRESENT ILLNESS:  The patient is an 84 y.o. 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  He has been on complete androgen deprivation therapy for months, which consists of Lupron /darolutamide .  As it pertains to his prostate cancer management, he denies having any side effects from his complete androgen blockade therapy.  He chronically takes darolutamide  just 3 times per week.  He denies having any new symptoms or findings which concern him for progression of his metastatic prostate cancer.    PHYSICAL EXAM:  There were no vitals taken for this visit. Wt Readings from Last 3 Encounters:  09/25/24 187 lb (84.8 kg)  06/22/24 187 lb 8 oz (85 kg)  05/05/24 191 lb (86.6 kg)   There is no height or weight on file to calculate BMI. Performance status (ECOG): 1 Physical Exam Constitutional:      General: He is not in acute distress.    Appearance: Normal appearance. He is normal weight.  HENT:     Head: Normocephalic and atraumatic.  Eyes:     General: No scleral icterus.    Extraocular Movements: Extraocular movements intact.     Conjunctiva/sclera: Conjunctivae normal.     Pupils: Pupils are equal, round, and reactive to light.  Cardiovascular:     Rate and Rhythm: Normal rate and regular rhythm.     Pulses: Normal pulses.     Heart sounds: Normal heart sounds. No murmur heard.    No friction rub. No gallop.  Pulmonary:     Effort: Pulmonary effort is normal. No respiratory distress.     Breath sounds: Normal breath sounds.  Abdominal:     General: Bowel sounds are normal. There is no distension.     Palpations: Abdomen is soft. There is no hepatomegaly, splenomegaly or mass.     Tenderness: There is no abdominal tenderness.   Musculoskeletal:        General: Normal range of motion.     Cervical back: Normal range of motion and neck supple.     Right lower leg: No edema.     Left lower leg: No edema.  Lymphadenopathy:     Cervical: No cervical adenopathy.  Skin:    General: Skin is warm and dry.  Neurological:     General: No focal deficit present.     Mental Status: He is alert and oriented to person, place, and time. Mental status is at baseline.  Psychiatric:        Mood and Affect: Mood normal.        Behavior: Behavior normal.        Thought Content: Thought content normal.        Judgment: Judgment normal.    LABS:    Latest Reference Range & Units 06/21/24 11:23 09/21/24 11:18 12/25/24 10:51  Prostatic Specific Antigen 0.00 - 4.00 ng/mL 0.05 0.03 <0.02     ASSESSMENT & PLAN:  Assessment/Plan:  An 84 y.o. male with metastatic prostate cancer.  His PSA level of .03 remains consistent with undetectable disease, which is when the level is < 0.1.  Based upon this, he will continue to take darolutamide  300 mg 3 times per week.  He will continue Lupron  shots every 3 months, including his next shot  today.  I will see him back in 3 months for repeat clinical assessment.  The patient understands all the plans discussed today and is in agreement with them.    Kalea Perine DELENA Kerns, MD       "

## 2024-12-26 ENCOUNTER — Inpatient Hospital Stay

## 2024-12-26 ENCOUNTER — Telehealth: Payer: Self-pay | Admitting: Oncology

## 2024-12-26 ENCOUNTER — Inpatient Hospital Stay: Admitting: Oncology

## 2024-12-26 ENCOUNTER — Other Ambulatory Visit: Payer: Self-pay | Admitting: Oncology

## 2024-12-26 VITALS — BP 121/74 | HR 60 | Temp 97.8°F | Resp 16 | Ht 70.0 in | Wt 186.5 lb

## 2024-12-26 DIAGNOSIS — C61 Malignant neoplasm of prostate: Secondary | ICD-10-CM

## 2024-12-26 DIAGNOSIS — C7989 Secondary malignant neoplasm of other specified sites: Secondary | ICD-10-CM | POA: Diagnosis not present

## 2024-12-26 MED ORDER — LEUPROLIDE ACETATE (3 MONTH) 22.5 MG IM KIT
22.5000 mg | PACK | Freq: Once | INTRAMUSCULAR | Status: AC
  Administered 2024-12-26: 22.5 mg via INTRAMUSCULAR
  Filled 2024-12-26: qty 22.5

## 2024-12-26 NOTE — Patient Instructions (Signed)
 Leuprolide  Suspension for Injection (Prostate Cancer) What is this medication? LEUPROLIDE  (loo PROE lide) reduces the symptoms of prostate cancer. It works by decreasing levels of the hormone testosterone  in the body. This prevents prostate cancer cells from spreading or growing. This medicine may be used for other purposes; ask your health care provider or pharmacist if you have questions. COMMON BRAND NAME(S): Eligard , Lupron  Depot, Lutrate Depot  What should I tell my care team before I take this medication? They need to know if you have any of these conditions: Diabetes Heart disease Heart failure High or low levels of electrolytes, such as magnesium, potassium, or sodium in your blood Irregular heartbeat or rhythm Seizures An unusual or allergic reaction to leuprolide , other medications, foods, dyes, or preservatives Pregnant or trying to get pregnant Breast-feeding How should I use this medication? This medication is injected under the skin or into a muscle. It is given by your care team in a hospital or clinic setting. Talk to your care team about the use of this medication in children. Special care may be needed. Overdosage: If you think you have taken too much of this medicine contact a poison control center or emergency room at once. NOTE: This medicine is only for you. Do not share this medicine with others. What if I miss a dose? Keep appointments for follow-up doses. It is important not to miss your dose. Call your care team if you are unable to keep an appointment. What may interact with this medication? Do not take this medication with any of the following: Cisapride Dronedarone Ketoconazole Levoketoconazole Pimozide Thioridazine This medication may also interact with the following: Other medications that cause heart rhythm changes This list may not describe all possible interactions. Give your health care provider a list of all the medicines, herbs, non-prescription  drugs, or dietary supplements you use. Also tell them if you smoke, drink alcohol, or use illegal drugs. Some items may interact with your medicine. What should I watch for while using this medication? Your condition will be monitored carefully while you are receiving this medication. Tell your care team if your symptoms do not start to get better or if they get worse. Heart attacks and strokes have been reported with the use of this medication. Get emergency help if you develop signs or symptoms of a heart attack or stroke. Talk to your care team about the risks and benefits of this medication. This medication may cause serious skin reactions. They can happen weeks to months after starting the medication. Talk to your care team right away if you have fevers or flu-like symptoms with a rash. The rash may be red or purple and then turn into blisters or peeling of the skin. Or you might notice a red rash with swelling of the face, lips, or lymph nodes in your neck or under your arms. This medication may increase blood sugar. The risk may be higher in patients who already have diabetes. Ask your care team what you can do to lower your risk of diabetes while taking this medication. This medication may cause infertility. Talk to your care team if you are concerned about your fertility. What side effects may I notice from receiving this medication? Side effects that you should report to your care team as soon as possible: Allergic reactions--skin rash, itching, hives, swelling of the face, lips, tongue, or throat Heart attack--pain or tightness in the chest, shoulders, arms, or jaw, nausea, shortness of breath, cold or clammy skin, feeling faint or  lightheaded Heart rhythm changes--fast or irregular heartbeat, dizziness, feeling faint or lightheaded, chest pain, trouble breathing High blood sugar (hyperglycemia)--increased thirst or amount of urine, unusual weakness or fatigue, blurry vision New or worsening  seizures Redness, blistering, peeling, or loosening of the skin, including inside the mouth Stroke--sudden numbness or weakness of the face, arm, or leg, trouble speaking, confusion, trouble walking, loss of balance or coordination, dizziness, severe headache, change in vision Swelling and pain of the tumor site or lymph nodes Side effects that usually do not require medical attention (report these to your care team if they continue or are bothersome): Change in sex drive or performance Hot flashes Joint pain Pain, redness, or irritation at injection site Swelling of the ankles, hands, or feet Unusual weakness or fatigue This list may not describe all possible side effects. Call your doctor for medical advice about side effects. You may report side effects to FDA at 1-800-FDA-1088. Where should I keep my medication? This medication is given in a hospital or clinic. It will not be stored at home. NOTE: This sheet is a summary. It may not cover all possible information. If you have questions about this medicine, talk to your doctor, pharmacist, or health care provider.  2025 [employer]vier/Gold Standard (2024-02-28 00:00:00)

## 2024-12-26 NOTE — Telephone Encounter (Signed)
 Patient has been scheduled for follow-up visit per 12/26/2024 LOS.  Pt given an appt calendar with date and time.

## 2025-01-18 ENCOUNTER — Other Ambulatory Visit: Payer: Self-pay | Admitting: Family Medicine

## 2025-01-18 DIAGNOSIS — F411 Generalized anxiety disorder: Secondary | ICD-10-CM

## 2025-01-19 ENCOUNTER — Other Ambulatory Visit: Payer: Self-pay

## 2025-01-19 DIAGNOSIS — F411 Generalized anxiety disorder: Secondary | ICD-10-CM

## 2025-01-19 MED ORDER — BUSPIRONE HCL 30 MG PO TABS: 30.0000 mg | ORAL_TABLET | Freq: Two times a day (BID) | ORAL | 2 refills | Status: AC

## 2025-03-23 ENCOUNTER — Inpatient Hospital Stay

## 2025-03-26 ENCOUNTER — Inpatient Hospital Stay: Admitting: Oncology

## 2025-03-26 ENCOUNTER — Inpatient Hospital Stay
